# Patient Record
Sex: Female | Born: 1965 | Race: Black or African American | Hispanic: No | Marital: Married | Smoking: Never smoker
Health system: Southern US, Community
[De-identification: ages and names within clinical notes are randomized; demographics above are authoritative.]

## PROBLEM LIST (undated history)

## (undated) DIAGNOSIS — M75101 Unspecified rotator cuff tear or rupture of right shoulder, not specified as traumatic: Secondary | ICD-10-CM

## (undated) DIAGNOSIS — I10 Essential (primary) hypertension: Secondary | ICD-10-CM

## (undated) DIAGNOSIS — R51 Headache: Secondary | ICD-10-CM

## (undated) DIAGNOSIS — C50919 Malignant neoplasm of unspecified site of unspecified female breast: Secondary | ICD-10-CM

## (undated) DIAGNOSIS — Z923 Personal history of irradiation: Secondary | ICD-10-CM

## (undated) DIAGNOSIS — E669 Obesity, unspecified: Secondary | ICD-10-CM

## (undated) DIAGNOSIS — E119 Type 2 diabetes mellitus without complications: Secondary | ICD-10-CM

## (undated) DIAGNOSIS — Z8042 Family history of malignant neoplasm of prostate: Secondary | ICD-10-CM

## (undated) DIAGNOSIS — D219 Benign neoplasm of connective and other soft tissue, unspecified: Secondary | ICD-10-CM

## (undated) DIAGNOSIS — Z8041 Family history of malignant neoplasm of ovary: Secondary | ICD-10-CM

## (undated) DIAGNOSIS — N921 Excessive and frequent menstruation with irregular cycle: Secondary | ICD-10-CM

## (undated) DIAGNOSIS — E785 Hyperlipidemia, unspecified: Secondary | ICD-10-CM

## (undated) DIAGNOSIS — R519 Headache, unspecified: Secondary | ICD-10-CM

## (undated) DIAGNOSIS — R232 Flushing: Secondary | ICD-10-CM

## (undated) DIAGNOSIS — E559 Vitamin D deficiency, unspecified: Secondary | ICD-10-CM

## (undated) DIAGNOSIS — G4700 Insomnia, unspecified: Secondary | ICD-10-CM

## (undated) DIAGNOSIS — I890 Lymphedema, not elsewhere classified: Secondary | ICD-10-CM

## (undated) DIAGNOSIS — K219 Gastro-esophageal reflux disease without esophagitis: Secondary | ICD-10-CM

## (undated) DIAGNOSIS — R7301 Impaired fasting glucose: Secondary | ICD-10-CM

## (undated) DIAGNOSIS — R011 Cardiac murmur, unspecified: Secondary | ICD-10-CM

## (undated) DIAGNOSIS — K259 Gastric ulcer, unspecified as acute or chronic, without hemorrhage or perforation: Secondary | ICD-10-CM

## (undated) DIAGNOSIS — Z803 Family history of malignant neoplasm of breast: Secondary | ICD-10-CM

## (undated) DIAGNOSIS — D649 Anemia, unspecified: Secondary | ICD-10-CM

## (undated) HISTORY — DX: Hyperlipidemia, unspecified: E78.5

## (undated) HISTORY — DX: Family history of malignant neoplasm of ovary: Z80.41

## (undated) HISTORY — DX: Cardiac murmur, unspecified: R01.1

## (undated) HISTORY — DX: Impaired fasting glucose: R73.01

## (undated) HISTORY — DX: Type 2 diabetes mellitus without complications: E11.9

## (undated) HISTORY — DX: Malignant neoplasm of unspecified site of unspecified female breast: C50.919

## (undated) HISTORY — DX: Unspecified rotator cuff tear or rupture of right shoulder, not specified as traumatic: M75.101

## (undated) HISTORY — DX: Family history of malignant neoplasm of breast: Z80.3

## (undated) HISTORY — DX: Gastric ulcer, unspecified as acute or chronic, without hemorrhage or perforation: K25.9

## (undated) HISTORY — DX: Vitamin D deficiency, unspecified: E55.9

## (undated) HISTORY — DX: Excessive and frequent menstruation with irregular cycle: N92.1

## (undated) HISTORY — DX: Family history of malignant neoplasm of prostate: Z80.42

## (undated) HISTORY — DX: Lymphedema, not elsewhere classified: I89.0

## (undated) HISTORY — DX: Insomnia, unspecified: G47.00

## (undated) HISTORY — DX: Gastro-esophageal reflux disease without esophagitis: K21.9

## (undated) HISTORY — DX: Essential (primary) hypertension: I10

## (undated) HISTORY — PX: BREAST SURGERY: SHX581

## (undated) HISTORY — DX: Flushing: R23.2

## (undated) HISTORY — DX: Obesity, unspecified: E66.9

## (undated) SURGERY — LAPAROTOMY, EXPLORATORY
Anesthesia: General

---

## 1998-07-20 ENCOUNTER — Other Ambulatory Visit: Admission: RE | Admit: 1998-07-20 | Discharge: 1998-07-20 | Payer: Self-pay | Admitting: *Deleted

## 1999-07-27 ENCOUNTER — Other Ambulatory Visit: Admission: RE | Admit: 1999-07-27 | Discharge: 1999-07-27 | Payer: Self-pay | Admitting: *Deleted

## 2000-07-31 ENCOUNTER — Other Ambulatory Visit: Admission: RE | Admit: 2000-07-31 | Discharge: 2000-07-31 | Payer: Self-pay | Admitting: *Deleted

## 2000-11-23 ENCOUNTER — Other Ambulatory Visit: Admission: RE | Admit: 2000-11-23 | Discharge: 2000-11-23 | Payer: Self-pay | Admitting: *Deleted

## 2001-03-27 ENCOUNTER — Encounter: Admission: RE | Admit: 2001-03-27 | Discharge: 2001-05-30 | Payer: Self-pay | Admitting: *Deleted

## 2001-05-02 ENCOUNTER — Inpatient Hospital Stay (HOSPITAL_COMMUNITY): Admission: AD | Admit: 2001-05-02 | Discharge: 2001-05-02 | Payer: Self-pay | Admitting: *Deleted

## 2001-05-02 ENCOUNTER — Encounter: Payer: Self-pay | Admitting: Obstetrics and Gynecology

## 2001-05-09 ENCOUNTER — Ambulatory Visit (HOSPITAL_COMMUNITY): Admission: RE | Admit: 2001-05-09 | Discharge: 2001-05-09 | Payer: Self-pay | Admitting: *Deleted

## 2001-05-10 ENCOUNTER — Encounter (INDEPENDENT_AMBULATORY_CARE_PROVIDER_SITE_OTHER): Payer: Self-pay | Admitting: *Deleted

## 2001-05-10 ENCOUNTER — Inpatient Hospital Stay (HOSPITAL_COMMUNITY): Admission: AD | Admit: 2001-05-10 | Discharge: 2001-05-13 | Payer: Self-pay | Admitting: *Deleted

## 2001-06-27 ENCOUNTER — Other Ambulatory Visit: Admission: RE | Admit: 2001-06-27 | Discharge: 2001-06-27 | Payer: Self-pay | Admitting: *Deleted

## 2002-08-07 ENCOUNTER — Other Ambulatory Visit: Admission: RE | Admit: 2002-08-07 | Discharge: 2002-08-07 | Payer: Self-pay | Admitting: *Deleted

## 2003-09-10 ENCOUNTER — Other Ambulatory Visit: Admission: RE | Admit: 2003-09-10 | Discharge: 2003-09-10 | Payer: Self-pay | Admitting: *Deleted

## 2004-10-22 ENCOUNTER — Other Ambulatory Visit: Admission: RE | Admit: 2004-10-22 | Discharge: 2004-10-22 | Payer: Self-pay | Admitting: Family Medicine

## 2005-02-15 ENCOUNTER — Encounter: Admission: RE | Admit: 2005-02-15 | Discharge: 2005-05-16 | Payer: Self-pay | Admitting: Family Medicine

## 2005-11-17 ENCOUNTER — Other Ambulatory Visit: Admission: RE | Admit: 2005-11-17 | Discharge: 2005-11-17 | Payer: Self-pay | Admitting: Family Medicine

## 2006-01-26 ENCOUNTER — Ambulatory Visit (HOSPITAL_COMMUNITY): Admission: RE | Admit: 2006-01-26 | Discharge: 2006-01-26 | Payer: Self-pay | Admitting: Family Medicine

## 2008-02-20 ENCOUNTER — Other Ambulatory Visit: Admission: RE | Admit: 2008-02-20 | Discharge: 2008-02-20 | Payer: Self-pay | Admitting: Family Medicine

## 2008-08-04 ENCOUNTER — Encounter: Admission: RE | Admit: 2008-08-04 | Discharge: 2008-08-21 | Payer: Self-pay | Admitting: Family Medicine

## 2009-06-03 ENCOUNTER — Other Ambulatory Visit: Admission: RE | Admit: 2009-06-03 | Discharge: 2009-06-03 | Payer: Self-pay | Admitting: Obstetrics and Gynecology

## 2010-06-24 ENCOUNTER — Other Ambulatory Visit: Admission: RE | Admit: 2010-06-24 | Discharge: 2010-06-24 | Payer: Self-pay | Admitting: Obstetrics and Gynecology

## 2010-07-07 ENCOUNTER — Encounter: Admission: RE | Admit: 2010-07-07 | Discharge: 2010-07-07 | Payer: Self-pay | Admitting: Obstetrics and Gynecology

## 2010-07-14 ENCOUNTER — Encounter: Admission: RE | Admit: 2010-07-14 | Discharge: 2010-07-14 | Payer: Self-pay | Admitting: Obstetrics and Gynecology

## 2010-08-13 ENCOUNTER — Ambulatory Visit (HOSPITAL_COMMUNITY): Admission: RE | Admit: 2010-08-13 | Discharge: 2010-08-13 | Payer: Self-pay | Admitting: Interventional Radiology

## 2010-08-16 ENCOUNTER — Ambulatory Visit (HOSPITAL_COMMUNITY): Admission: RE | Admit: 2010-08-16 | Discharge: 2010-08-17 | Payer: Self-pay | Admitting: Interventional Radiology

## 2010-08-16 HISTORY — PX: UTERINE ARTERY EMBOLIZATION: SHX2629

## 2010-09-01 ENCOUNTER — Encounter: Admission: RE | Admit: 2010-09-01 | Discharge: 2010-09-01 | Payer: Self-pay | Admitting: Interventional Radiology

## 2011-01-13 LAB — CBC
HCT: 32.3 % — ABNORMAL LOW (ref 36.0–46.0)
MCHC: 33.9 g/dL (ref 30.0–36.0)
RBC: 3.6 MIL/uL — ABNORMAL LOW (ref 3.87–5.11)
RDW: 17.1 % — ABNORMAL HIGH (ref 11.5–15.5)
WBC: 6 10*3/uL (ref 4.0–10.5)

## 2011-01-13 LAB — CREATININE, SERUM
Creatinine, Ser: 0.73 mg/dL (ref 0.4–1.2)
GFR calc non Af Amer: >60 mL/min (ref >60)

## 2011-01-13 LAB — HCG, SERUM, QUALITATIVE: Preg, Serum: NEGATIVE

## 2011-01-27 ENCOUNTER — Other Ambulatory Visit: Payer: Self-pay | Admitting: Interventional Radiology

## 2011-01-27 DIAGNOSIS — D259 Leiomyoma of uterus, unspecified: Secondary | ICD-10-CM

## 2011-02-01 ENCOUNTER — Other Ambulatory Visit: Payer: Self-pay | Admitting: Obstetrics and Gynecology

## 2011-02-01 DIAGNOSIS — D219 Benign neoplasm of connective and other soft tissue, unspecified: Secondary | ICD-10-CM

## 2011-02-01 DIAGNOSIS — N920 Excessive and frequent menstruation with regular cycle: Secondary | ICD-10-CM

## 2011-03-01 ENCOUNTER — Encounter: Payer: Self-pay | Admitting: Radiology

## 2011-03-14 ENCOUNTER — Ambulatory Visit
Admission: RE | Admit: 2011-03-14 | Discharge: 2011-03-14 | Disposition: A | Discharge status: Home / Self Care | Payer: 59 | Source: Ambulatory Visit | Attending: Obstetrics and Gynecology | Admitting: Obstetrics and Gynecology

## 2011-03-14 DIAGNOSIS — D219 Benign neoplasm of connective and other soft tissue, unspecified: Secondary | ICD-10-CM

## 2011-03-14 DIAGNOSIS — N920 Excessive and frequent menstruation with regular cycle: Secondary | ICD-10-CM

## 2011-03-14 MED ORDER — GADOBENATE DIMEGLUMINE 529 MG/ML IV SOLN
20.0000 mL | Freq: Once | INTRAVENOUS | Status: AC | PRN
  Administered 2011-03-14: 20 mL via INTRAVENOUS

## 2011-03-18 NOTE — Op Note (Signed)
[facility]  Patient:    Kara Crawford, Kara Crawford                         MRN: [id number] Proc. Date: 05/10/01 Attending:  Willey Blade, M.D.                           Operative Report  PREOPERATIVE DIAGNOSES:       1. Intrauterine pregnancy at 36-2/7 weeks.                               2. Chronic hypertension.                               3. Gestational diabetes--diet controlled.                               4. Repeat cesarean section.                               5. Mature fetal pulmonary lung status.                               6. Breech presentation.                               7. Voluntary sterilization.  POSTOPERATIVE DIAGNOSES:      1. Intrauterine pregnancy at 36-2/7 weeks.                               2. Chronic hypertension.                               3. Gestational diabetes--diet controlled.                               4. Repeat cesarean section.                               5. Mature fetal pulmonary lung status.                               6. Breech presentation.                               7. Voluntary sterilization.  PROCEDURE:                    1. Repeat low transverse cesarean section.                               2. Bilateral tubal ligation.  3. Myomectomy--minor.  SURGEON:                      Willey Blade, M.D.  ANESTHESIA:                   Spinal.  ESTIMATED BLOOD LOSS:         1200 cc.  COMPLICATIONS:                None.  FINDINGS:                     At 0932 through a low transverse uterine incision, a viable female infant was delivered from a compound presentation. This was really a shoulder and right arm presentation, the baby was delivered without difficulty. Apgars were 9 and 9. The baby was a female weighing 8 pounds 14 ounces.  At time of surgery, pelvic adhesions were identified. Most notably, this was between the greater omentum and the anterior abdominal wall. A bilateral  tubal ligation was performed. The ovaries were visualized and noted to be normal.  DESCRIPTION OF PROCEDURE:     The patient was taken to the operating room where a spinal anesthetic was administered. The patient was placed on the operating table in the left lateral tilt position. The abdomen was prepped and draped in the usual sterile fashion with Betadine and sterile drapes. A Foley catheter was then inserted. The abdomen was entered through a Pfannenstiel incision and carried down sharply in the usual fashion. The peritoneum was atraumatically entered. The vesicouterine peritoneum overlying the lower uterine segment was incised and a bladder blade was placed behind the bladder to insure its protection during the procedure. Dissection was undertaken sharply to remove pelvic adhesions. The Bovie cautery used to achieve hemostasis during this dissection.  Next, the uterus was entered through a low transverse incision and carried out laterally using the operators fingers. The vertex was elevated into the incision; however, this was done with some difficulty due to a sort of a shoulder presentation. Upon entry into the intrauterine cavity, clear fluid was noted and the babys right hand and arm presented. This was reduced and with some difficulty, the vertex was then elevated into the incision and delivered at 0932. Apgars were 9 and 9. The oro/nasopharynx was bulb suctioned and the cord doubly clamped in the usual fashion. The baby was a female weighing 8 pounds 14 ounces, delivered at 0932 and did well.  The placenta was then manually extracted intact with three-vessel cord without difficulty. The interior of the uterus was wiped clean thoroughly with a wet sponge. The uterine incision was then closed in a two-layer fashion. The first layer, a running interlocking suture of #1 Vicryl suture. A second imbricating suture was placed across the primary suture line with a running stitch of  #1 Vicryl as well. Multiple figure-of-eights were necessary to achieve hemostasis.  Next, tubal ligation was then performed. First, the left tube was identified and traced to its fimbriated end to insure its positive identification. The tube was then grasped approximately 2 cm from the uterine fundus and through an avascular region of the mesosalpinx, two strands of 0 plain suture were passed and an approximate 3 cm segment of tube was tied off using these two strands of 0 plain. An approximate 2.5 cm segment of tube was then excised between the two existing ligatures. A single ligature of 0 silk was placed on the medial tubal stump near  the preexisting 0 silk suture. The same procedure was repeated upon the right tube. Three small fibroids, about 1.5 cm in size were then moved from the anterior serosal portion of the uterus. This was done with a Bovie cautery and the base of these were sutured with figure-of-eight sutures of #1 Vicryl.  The pelvis was then thoroughly irrigated and all operative areas were noted to be hemostatic. Attention was then turned to closure. The rectus muscle and anterior peritoneum were closed with a running stitch of 0 Monocryl. The subfascial areas were hemostatic. The fascia was then closed with a running stitch of 0 Panacryl in a running fashion. The subcutaneous tissue was irrigated and made hemostatic using a Bovie cautery. The skin was reapproximated with staples and a sterile dressing applied.  Final sponge, needle, and instrument counts were correct x 3. There were  no perioperative complications. The patient did receive an intraoperative antibiotic. DD:  05/10/01 TD:  05/10/01 Job: [id number]

## 2011-03-18 NOTE — Discharge Summary (Signed)
[facility] of [facility]  Patient:    Kara Crawford, Kara Crawford                       MRN: [id number] Adm. Date:  [id number] Disc. Date: [id number] Attending:  Donne Hazel Dictator:   Danie Chandler, R.N.                           Discharge Summary  ADMITTING DIAGNOSES:          1. Intrauterine pregnancy at 36-2/[redacted] weeks                                  gestation.                               2. Chronic hypertension.                               3. Gestational diabetes, diet controlled.                               4. Repeat cesarean section.                               5. Mature fetal pulmonary lung status.                               6. Breech presentation.                               7. Voluntary sterilization.  DISCHARGE DIAGNOSES:          1. Intrauterine pregnancy at 36-2/[redacted] weeks                                  gestation.                               2. Chronic hypertension.                               3. Gestational diabetes, diet controlled.                               4. Repeat cesarean section.                               5. Mature fetal pulmonary lung status.                               6. Breech presentation.                               7. Voluntary sterilization.  8. Anemia.  PROCEDURE ON May 10, 2001:   1. Repeat low transverse cesarean section.                               2. Bilateral tubal ligation.                               3. Minor myomectomy.  REASON FOR ADMISSION:         Please see dictated H&P.  HOSPITAL COURSE:              The patient was taken to the operating room and underwent the above named procedure without complication. This was productive of a viable female infant with Apgars of 9 at one minute and 9 at five minutes. Postoperatively on day #1, the patient had a good return of bowel function, was tolerating a regular diet. She also had good pain control. Her blood pressure was  stable on this day. Hemoglobin was 8.3, hematocrit 24.0, and white blood cell count 7.8. She was started on iron daily and had a repeat CBC ordered for the following day. On postoperative day #2, the patient was ambulating well without difficulty. Her vital signs were stable and her hemoglobin on this day was 7.2. On postoperative day #3, she requested discharge home.  CONDITION ON DISCHARGE:       Good.  DIET:                         Regular as tolerated.  ACTIVITY:                     No heavy lifting, no driving, no vaginal entry.  DISCHARGE FOLLOWUP:           She is to follow up in the office in tow weeks and she is to call for temperature greater than 100 degrees, persistent nausea or vomiting, heavy vaginal bleeding, and/or redness or drainage from the incision site.  DISCHARGE MEDICATIONS:        1. Prenatal vitamin one p.o. q.d.                               2. Percocet 5 mg one to two p.o. q.4-6h. p.r.n.                                  pain.                               3. Chromagen daily.                               4. Norvasc as directed by M.D.                               5. Ibuprofen as directed by M.D.  DD:  05/25/01 TD:  05/25/01 Job: [id number]

## 2011-03-18 NOTE — H&P (Signed)
[facility]  Patient:    Kara Crawford, Kara Crawford                       MRN: [id number] Adm. Date:  [id number] Attending:  Donne Hazel                         History and Physical  HISTORY OF PRESENT ILLNESS:   Ms. Rullo is a 45 year old female, gravida 4, para 3, at 36-2/[redacted] weeks gestation.  The patient underwent amniocentesis yesterday to confirm fetal lung maturity, this was mature, and she is now admitted to undergo repeat cesarean section and bilateral tubal ligation.  The patients pregnancy has been complicated by chronic hypertension (quite significant) and gestational diabetes, diet-controlled.  Yesterday she underwent amniocentesis, which showed an l/s of 6:1 with positive PG.  She has had two previous cesarean deliveries and requested a repeat.  She also requested tubal sterilization.  PAST MEDICAL HISTORY:         1. History of uterine fibroids.                               2. History of borderline chronic hypertension.  PAST SURGICAL HISTORY:        1. Cesarean section x 2.                               2. D&E, 1996.  OBSTETRICAL HISTORY:          1. Cesarean section at term, a female weighing 7 pounds 11 ounces, secondary to CPD.                               2. SAB x 1 with D&E.                               3. 1998, a repeat cesarean section at term, a female weighing 8 pounds 7 ounces.  MEDICATIONS:                  Norvasc, Aldomet, and prenatal vitamins.  ALLERGIES:                    None known.  PHYSICAL EXAMINATION:  VITAL SIGNS:                  Stable.  Temperature 98, pulse 100, respirations 20, blood pressure 160/94, fetal heart tones positive.  GENERAL:                      She is a well-developed, well-nourished, overweight female in no acute distress.  HEENT:                        Within normal limits.  NECK:                         Supple without adenopathy or thyromegaly.  HEART:                         Regular rate and rhythm without murmur, gallop, or rub.  LUNGS:  Clear to auscultation.  BREASTS:                      Breast exam done recently in the office was normal but is deferred upon admission.  ABDOMEN:                      Gravid and nontender.  EXTREMITIES:                  Grossly normal.  NEUROLOGIC:                   Grossly normal.  PELVIC:                       Deferred at this time.  This has been normal recently in the office.  ADMITTING DIAGNOSES:          1. Intrauterine pregnancy at 36-2/7 weeks.                               2. Repeat cesarean section.                               3. Chronic hypertension.                               4. Gestational diabetes, diet-controlled.                               5. Mature fetal pulmonary lung status.                               6. Requests permanent, voluntary sterilization.  PLAN:                         1. Repeat low transverse cesarean section.                               2. Bilateral tubal ligation.  DISCUSSION:                   The risks and benefits of the surgery were explained to the patient.  The risks of bleeding, infection, the risk of injury to surrounding organs was reviewed.  Also, the patient requested bilateral tubal ligation.  A failure rate of 1 in 200 was explained.  The patient was allowed to ask questions and wished to proceed. DD:  05/10/01 TD:  05/10/01 Job: [id number]

## 2011-03-30 ENCOUNTER — Ambulatory Visit
Admission: RE | Admit: 2011-03-30 | Discharge: 2011-03-30 | Disposition: A | Discharge status: Home / Self Care | Payer: 59 | Source: Ambulatory Visit | Attending: Interventional Radiology | Admitting: Interventional Radiology

## 2011-03-30 VITALS — BP 191/100 | HR 97 | Temp 98.7°F | Resp 16

## 2011-03-30 DIAGNOSIS — D259 Leiomyoma of uterus, unspecified: Secondary | ICD-10-CM

## 2011-03-30 HISTORY — DX: Benign neoplasm of connective and other soft tissue, unspecified: D21.9

## 2011-06-29 ENCOUNTER — Other Ambulatory Visit (HOSPITAL_COMMUNITY)
Admission: RE | Admit: 2011-06-29 | Discharge: 2011-06-29 | Disposition: A | Discharge status: Home / Self Care | Payer: 59 | Source: Ambulatory Visit | Attending: Obstetrics and Gynecology | Admitting: Obstetrics and Gynecology

## 2011-06-29 ENCOUNTER — Other Ambulatory Visit: Payer: Self-pay | Admitting: Obstetrics and Gynecology

## 2011-06-29 DIAGNOSIS — Z01419 Encounter for gynecological examination (general) (routine) without abnormal findings: Secondary | ICD-10-CM | POA: Insufficient documentation

## 2012-09-05 ENCOUNTER — Other Ambulatory Visit (HOSPITAL_COMMUNITY)
Admission: RE | Admit: 2012-09-05 | Discharge: 2012-09-05 | Disposition: A | Discharge status: Home / Self Care | Payer: 59 | Source: Ambulatory Visit | Attending: Obstetrics and Gynecology | Admitting: Obstetrics and Gynecology

## 2012-09-05 ENCOUNTER — Other Ambulatory Visit: Payer: Self-pay | Admitting: Obstetrics and Gynecology

## 2012-09-05 DIAGNOSIS — Z01419 Encounter for gynecological examination (general) (routine) without abnormal findings: Secondary | ICD-10-CM | POA: Insufficient documentation

## 2013-09-24 ENCOUNTER — Other Ambulatory Visit (HOSPITAL_COMMUNITY)
Admission: RE | Admit: 2013-09-24 | Discharge: 2013-09-24 | Disposition: A | Discharge status: Home / Self Care | Payer: 59 | Source: Ambulatory Visit | Attending: Obstetrics and Gynecology | Admitting: Obstetrics and Gynecology

## 2013-09-24 ENCOUNTER — Other Ambulatory Visit: Payer: Self-pay | Admitting: Obstetrics and Gynecology

## 2013-09-24 DIAGNOSIS — Z124 Encounter for screening for malignant neoplasm of cervix: Secondary | ICD-10-CM | POA: Insufficient documentation

## 2013-09-24 DIAGNOSIS — Z1151 Encounter for screening for human papillomavirus (HPV): Secondary | ICD-10-CM | POA: Insufficient documentation

## 2014-03-20 ENCOUNTER — Encounter (INDEPENDENT_AMBULATORY_CARE_PROVIDER_SITE_OTHER): Payer: Self-pay | Admitting: Surgery

## 2014-03-20 ENCOUNTER — Other Ambulatory Visit (INDEPENDENT_AMBULATORY_CARE_PROVIDER_SITE_OTHER): Payer: Self-pay

## 2014-03-20 ENCOUNTER — Ambulatory Visit (INDEPENDENT_AMBULATORY_CARE_PROVIDER_SITE_OTHER): Payer: 59 | Admitting: Surgery

## 2014-03-20 NOTE — Progress Notes (Signed)
Re:   Kara Crawford DOB:   23-Dec-1965 MRN:   [id number]  ASSESSMENT AND PLAN: 1.  Morbid obesity  Initial weight - 240, BMI - 45.3  Per the [facility], the patient is a candidate for bariatric surgery.  The patient attended our initial information session and reviewed the types of bariatric surgery.    The patient is interested in the Roux en Y Gastric Bypass.  I discussed with the patient the indications and risks of bariatric surgery.  The potential risks of surgery include, but are not limited to, bleeding, infection, leak from the bowel, DVT and PE, open surgery, long term nutrition consequences, and death.  The patient understands the importance of compliance and long term follow-up with our group after surgery.  From here we will obtain lab tests, x-rays, nutrition consult, and psych consult.  She is still thinking about other options.  She will bring her husband to the next appt.  2.  HTN 3.  Hypercholesterolemia 4.  Just starting to have irregular periods. 5.  Umbilical hernia.  7.  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 new bariatric   REFERRING PHYSICIAN: MCNEILL,Kara Crawford  HISTORY OF PRESENT ILLNESS: Kara Crawford is a 48 y.o. (DOB: Nov 10, 1965)  AA  female whose primary care physician is Kara Crawford and comes to me today for weight loss surgery.  The patient saw me for the bariatric information session. She has tried multiple diets including: Weight Watchers, Atkins diet, one from a nutritionist, one diet by Kara Crawford, and she start watching  calories. She's tried different medications 1 from [facility] [address] and once she got from [facility]. She's had no prior abdominal surgery or GI problems.  At this time she's interested in the gastric bypass operation.   Past Medical History  Diagnosis Date  . Fibroids   . Heart murmur   . Hypertension      Past Surgical History  Procedure  Laterality Date  . Uterine artery embolization  08-16-2010     Current Outpatient Prescriptions  Medication Sig Dispense Refill  . amLODipine (NORVASC) 10 MG tablet       . lisinopril-hydrochlorothiazide (PRINZIDE,ZESTORETIC) 20-12.5 MG per tablet       . metoprolol succinate (TOPROL-XL) 25 MG 24 hr tablet        No current facility-administered medications for this visit.    No Known Allergies  REVIEW OF SYSTEMS: Skin:  No history of rash.  No history of abnormal moles. Infection:  No history of hepatitis or HIV.  No history of MRSA. Neurologic:  No history of stroke.  No history of seizure.  No history of headaches. Cardiac:  No history of hypertension. No history of heart disease.   No history of seeing a cardiologist. Pulmonary:  Does not smoke cigarettes.  No asthma or bronchitis.  No OSA/CPAP.  Endocrine:  No diabetes. No thyroid disease.  Hypercholesterolemia. Gastrointestinal:  No history of stomach disease.  No history of liver disease.  No history of gall bladder disease.  No history of pancreas disease.  No history of colon disease. Urologic:  No history of kidney stones.  No history of bladder infections. GYN:  Kara Crawford, [facility] is her gyn Musculoskeletal:  No history of joint or back disease. Hematologic:  No bleeding disorder.  No history of anemia.  Not anticoagulated. Psycho-social:  The patient is oriented.   The patient  has no obvious psychologic or social impairment to understanding our conversation and plan.  SOCIAL and FAMILY HISTORY: Married.  I think that her husband works at [employer]. Going to school to get a sociology degree.  She wants to work with elderly. She has 3 children:  25 yo daughter, 66 yo daughter, 50 yo son  PHYSICAL EXAM: BP 126/78  Pulse 104  Temp(Src) 97.8 F (36.6 C)  Resp 16  Ht 5\' 1"  (1.549 m)  Wt 240 lb (108.863 kg)  BMI 45.37 kg/m2  General: Obese AA F who is alert and generally healthy appearing.   HEENT: Normal.  Pupils equal. Neck: Supple. No mass.  No thyroid mass. Lymph Nodes:  No supraclavicular or cervical nodes. Lungs: Clear to auscultation and symmetric breath sounds. Heart:  RRR. No murmur or rub.  Note she has a history of a murmur. Breasts:  Right - large, no mass  Left - large, no mass  Abdomen: Soft. No mass. No tenderness. Umbilical hernia.  Normal bowel sounds.  No abdominal scars.  She is more apple than pear and she has a lot of weight in her mid abdomen. Rectal: Not done. Extremities:  Good strength and ROM  in upper and lower extremities. Neurologic:  Grossly intact to motor and sensory function. Psychiatric: Has normal mood and affect. Behavior is normal.   DATA REVIEWED: Epic notes.  Kara Crawford,  [facility], [address]  [place], [place]    [place] Phone:  [phone] FAX:  [phone]

## 2014-04-04 ENCOUNTER — Other Ambulatory Visit (INDEPENDENT_AMBULATORY_CARE_PROVIDER_SITE_OTHER): Payer: Self-pay

## 2014-04-17 ENCOUNTER — Ambulatory Visit (HOSPITAL_COMMUNITY)
Admission: RE | Admit: 2014-04-17 | Discharge: 2014-04-17 | Disposition: A | Discharge status: Home / Self Care | Payer: 59 | Source: Ambulatory Visit | Attending: Surgery | Admitting: Surgery

## 2014-04-17 ENCOUNTER — Encounter (HOSPITAL_COMMUNITY)
Admission: RE | Disposition: A | Discharge status: Home / Self Care | Payer: Self-pay | Source: Ambulatory Visit | Attending: Surgery

## 2014-04-17 DIAGNOSIS — E669 Obesity, unspecified: Secondary | ICD-10-CM | POA: Insufficient documentation

## 2014-04-17 DIAGNOSIS — Z0189 Encounter for other specified special examinations: Secondary | ICD-10-CM | POA: Insufficient documentation

## 2014-04-17 HISTORY — PX: BREATH TEK H PYLORI: SHX5422

## 2014-04-17 SURGERY — BREATH TEST, FOR HELICOBACTER PYLORI

## 2014-04-18 ENCOUNTER — Encounter (HOSPITAL_COMMUNITY): Payer: Self-pay | Admitting: Surgery

## 2014-04-18 NOTE — Progress Notes (Signed)
04/17/14 0800  BREATH TEK ASSESSMENT  Referring MD Dr Lucia Gaskins  Time of Last PO Intake 2000  Baseline Breath At: 0801  Pranactin Given At: 0802  Post-Dose Breath At: 0817  Sample 1 2.6  Sample 2 2.9  Test Negative

## 2014-04-21 LAB — TSH: TSH: 1.296 u[IU]/mL (ref 0.350–4.500)

## 2014-05-06 ENCOUNTER — Encounter: Payer: Self-pay | Admitting: Dietician

## 2014-05-06 ENCOUNTER — Encounter: Payer: 59 | Attending: Surgery | Admitting: Dietician

## 2014-05-06 DIAGNOSIS — Z713 Dietary counseling and surveillance: Secondary | ICD-10-CM | POA: Diagnosis not present

## 2014-05-06 NOTE — Progress Notes (Signed)
  Pre-Op Assessment Visit:  Pre-Operative RYGB Surgery  Medical Nutrition Therapy:  Appt start time: 1000   End time:  6073.  Patient was seen on 05/06/2014 for Pre-Operative RYGB Nutrition Assessment. Assessment and letter of approval faxed to [facility] Bariatric Surgery Program coordinator on 05/06/2014.   Preferred Learning Style:   No preference indicated   Learning Readiness:   Ready  Handouts given during visit include:  Pre-Op Goals Bariatric Surgery Protein Shakes  Teaching Method Utilized:  Visual Auditory Hands on  Barriers to learning/adherence to lifestyle change: none  Demonstrated degree of understanding via:  Teach Back   Patient to call the [facility] to enroll in Pre-Op and Post-Op Nutrition Education when surgery date is scheduled.

## 2014-05-16 ENCOUNTER — Ambulatory Visit (HOSPITAL_COMMUNITY)
Admission: RE | Admit: 2014-05-16 | Discharge: 2014-05-16 | Disposition: A | Discharge status: Home / Self Care | Payer: 59 | Source: Ambulatory Visit | Attending: Surgery | Admitting: Surgery

## 2014-05-16 ENCOUNTER — Other Ambulatory Visit: Payer: Self-pay

## 2014-05-16 DIAGNOSIS — R011 Cardiac murmur, unspecified: Secondary | ICD-10-CM | POA: Insufficient documentation

## 2014-05-16 DIAGNOSIS — Q619 Cystic kidney disease, unspecified: Secondary | ICD-10-CM | POA: Insufficient documentation

## 2014-05-16 DIAGNOSIS — E119 Type 2 diabetes mellitus without complications: Secondary | ICD-10-CM | POA: Insufficient documentation

## 2014-05-16 DIAGNOSIS — N926 Irregular menstruation, unspecified: Secondary | ICD-10-CM | POA: Insufficient documentation

## 2014-05-16 DIAGNOSIS — K429 Umbilical hernia without obstruction or gangrene: Secondary | ICD-10-CM | POA: Insufficient documentation

## 2014-05-16 DIAGNOSIS — E7800 Pure hypercholesterolemia, unspecified: Secondary | ICD-10-CM | POA: Insufficient documentation

## 2014-05-16 DIAGNOSIS — I10 Essential (primary) hypertension: Secondary | ICD-10-CM | POA: Insufficient documentation

## 2014-05-16 DIAGNOSIS — Z6841 Body Mass Index (BMI) 40.0 and over, adult: Secondary | ICD-10-CM | POA: Insufficient documentation

## 2014-05-16 DIAGNOSIS — K7689 Other specified diseases of liver: Secondary | ICD-10-CM | POA: Insufficient documentation

## 2014-05-16 NOTE — Addendum Note (Signed)
Addended by: Ivor Costa on: 05/16/2014 09:17 AM   Modules accepted: Orders

## 2014-06-03 ENCOUNTER — Encounter: Payer: 59 | Attending: Family Medicine

## 2014-06-03 VITALS — Ht 60.0 in | Wt 240.8 lb

## 2014-06-03 DIAGNOSIS — Z713 Dietary counseling and surveillance: Secondary | ICD-10-CM | POA: Insufficient documentation

## 2014-06-03 DIAGNOSIS — E119 Type 2 diabetes mellitus without complications: Secondary | ICD-10-CM

## 2014-06-06 NOTE — Progress Notes (Signed)

## 2014-06-10 DIAGNOSIS — E119 Type 2 diabetes mellitus without complications: Secondary | ICD-10-CM

## 2014-06-10 NOTE — Progress Notes (Signed)

## 2014-06-17 DIAGNOSIS — E119 Type 2 diabetes mellitus without complications: Secondary | ICD-10-CM

## 2014-06-17 NOTE — Progress Notes (Signed)
Patient was seen on 06/17/14 for the third of a series of three diabetes self-management courses at the [facility]. The following learning objectives were met by the patient during this class:    State the amount of activity recommended for healthy living   Describe activities suitable for individual needs   Identify ways to regularly incorporate activity into daily life   Identify barriers to activity and ways to over come these barriers  Identify diabetes medications being personally used and their primary action for lowering glucose and possible side effects   Describe role of stress on blood glucose and develop strategies to address psychosocial issues   Identify diabetes complications and ways to prevent them  Explain how to manage diabetes during illness   Evaluate success in meeting personal goal   Establish 2-3 goals that they will plan to diligently work on until they return for the  54-month follow-up visit  Goals:  Follow Diabetes Meal Plan as instructed  Aim for 15-30 mins of physical activity daily as tolerated  Bring food record and glucose log to your follow up visit  Your patient has established the following 4 month goals in their individualized success plan: I will count my carb choices at most meals and snacks I will test my glucose at least 4 times a day, 7 days a week I will look at patterns in my record book at least 7 days a month  Your patient has identified these potential barriers to change:  None stated  Your patient has identified their diabetes self-care support plan as  [facility] Support Group available  Family support  Plan:  Attend Core 4 in 4 months

## 2014-06-26 ENCOUNTER — Ambulatory Visit: Payer: 59

## 2014-09-18 NOTE — Progress Notes (Signed)
Please put orders in Epic surgery 10-06-14 pre op 09-19-14 Thanks 

## 2014-09-19 ENCOUNTER — Encounter (HOSPITAL_COMMUNITY)
Admission: RE | Admit: 2014-09-19 | Discharge: 2014-09-19 | Disposition: A | Discharge status: Home / Self Care | Payer: 59 | Source: Ambulatory Visit | Attending: Surgery | Admitting: Surgery

## 2014-09-19 ENCOUNTER — Encounter (HOSPITAL_COMMUNITY): Payer: Self-pay

## 2014-09-19 ENCOUNTER — Ambulatory Visit (HOSPITAL_COMMUNITY)
Admission: RE | Admit: 2014-09-19 | Discharge: 2014-09-19 | Disposition: A | Discharge status: Home / Self Care | Payer: 59 | Source: Ambulatory Visit | Attending: Anesthesiology | Admitting: Anesthesiology

## 2014-09-19 DIAGNOSIS — I10 Essential (primary) hypertension: Secondary | ICD-10-CM

## 2014-09-19 DIAGNOSIS — Z01818 Encounter for other preprocedural examination: Secondary | ICD-10-CM | POA: Diagnosis not present

## 2014-09-19 HISTORY — DX: Anemia, unspecified: D64.9

## 2014-09-19 LAB — CBC
HCT: 40.3 % (ref 36.0–46.0)
Hemoglobin: 13.8 g/dL (ref 12.0–15.0)
MCH: 31.9 pg (ref 26.0–34.0)
MCHC: 34.2 g/dL (ref 30.0–36.0)
MCV: 93.1 fL (ref 78.0–100.0)
PLATELETS: 380 10*3/uL (ref 150–400)
RBC: 4.33 MIL/uL (ref 3.87–5.11)
RDW: 12.6 % (ref 11.5–15.5)
WBC: 6.9 10*3/uL (ref 4.0–10.5)

## 2014-09-19 LAB — BASIC METABOLIC PANEL
ANION GAP: 19 — AB (ref 5–15)
BUN: 12 mg/dL (ref 6–23)
CALCIUM: 11.4 mg/dL — AB (ref 8.4–10.5)
CO2: 24 mEq/L (ref 19–32)
Chloride: 97 mEq/L (ref 96–112)
Creatinine, Ser: 0.74 mg/dL (ref 0.50–1.10)
GFR calc non Af Amer: >90 mL/min (ref >90)
Glucose, Bld: 152 mg/dL — ABNORMAL HIGH (ref 70–99)
Potassium: 4.1 mEq/L (ref 3.7–5.3)
SODIUM: 140 meq/L (ref 137–147)

## 2014-09-19 LAB — HCG, SERUM, QUALITATIVE: PREG SERUM: NEGATIVE

## 2014-09-19 NOTE — Patient Instructions (Signed)
[place]  09/19/2014   Your procedure is scheduled on:   10-06-2014 Monday  Enter through [facility] and follow signs to [facility]. Arrive at  0530      AM ..  Call this number if you have problems the morning of surgery: [phone]  Or Presurgical Testing [phone].   For Living Will and/or Health Care Power Attorney Forms: please provide copy for your medical record,may bring AM of surgery(Forms should be already notarized -we do not provide this service).(09-19-14 No information preferred today).  Remember: Follow any bowel prep instructions per MD office.    Do not eat food/ or drink: After Midnight.      Take these medicines the morning of surgery with A SIP OF WATER: Metoprolol. Amlodipine. Atorvastatin.  Do not take any Diabetic meds AM of.   Do not wear jewelry, make-up or nail polish.  Do not wear deodorant, lotions, powders, or perfumes.   Do not shave legs and under arms- 48 hours(2 days) prior to first CHG shower.(Shaving face and neck okay.)  Do not bring valuables to the hospital.(Hospital is not responsible for lost valuables).  Contacts, dentures or removable bridgework, body piercing, hair pins may not be worn into surgery.  Leave suitcase in the car. After surgery it may be brought to your room.  For patients admitted to the hospital, checkout time is 11:00 AM the day of discharge.(Restricted visitors-Any Persons displaying flu-like symptoms or illness).    Patients discharged the day of surgery will not be allowed to drive home. Must have responsible person with you x 24 hours once discharged.  Name and phone number of your driver: Dominica Severin spouse [phone] cell     Please read over the following fact sheets that you were given:  CHG(Chlorhexidine Gluconate 4% Surgical Soap) use.           [facility] - Preparing for Surgery Before surgery, you can play an important role.  Because skin is not sterile, your skin needs to be as  free of germs as possible.  You can reduce the number of germs on your skin by washing with CHG (chlorahexidine gluconate) soap before surgery.  CHG is an antiseptic cleaner which kills germs and bonds with the skin to continue killing germs even after washing. Please DO NOT use if you have an allergy to CHG or antibacterial soaps.  If your skin becomes reddened/irritated stop using the CHG and inform your nurse when you arrive at Short Stay. Do not shave (including legs and underarms) for at least 48 hours prior to the first CHG shower.  You may shave your face/neck. Please follow these instructions carefully:  1.  Shower with CHG Soap the night before surgery and the  morning of Surgery.  2.  If you choose to wash your hair, wash your hair first as usual with your  normal  shampoo.  3.  After you shampoo, rinse your hair and body thoroughly to remove the  shampoo.                           4.  Use CHG as you would any other liquid soap.  You can apply chg directly  to the skin and wash                       Gently with a scrungie or clean washcloth.  5.  Apply  the CHG Soap to your body ONLY FROM THE NECK DOWN.   Do not use on face/ open                           Wound or open sores. Avoid contact with eyes, ears mouth and genitals (private parts).                       Wash face,  Genitals (private parts) with your normal soap.             6.  Wash thoroughly, paying special attention to the area where your surgery  will be performed.  7.  Thoroughly rinse your body with warm water from the neck down.  8.  DO NOT shower/wash with your normal soap after using and rinsing off  the CHG Soap.                9.  Pat yourself dry with a clean towel.            10.  Wear clean pajamas.            11.  Place clean sheets on your bed the night of your first shower and do not  sleep with pets. Day of Surgery : Do not apply any lotions/deodorants the morning of surgery.  Please wear clean clothes to the  hospital/surgery center.  FAILURE TO FOLLOW THESE INSTRUCTIONS MAY RESULT IN THE CANCELLATION OF YOUR SURGERY PATIENT SIGNATURE_________________________________  NURSE SIGNATURE__________________________________  ________________________________________________________________________

## 2014-09-19 NOTE — Pre-Procedure Instructions (Signed)
09-19-14 Ekg 7'15 Epic. CXR done today.

## 2014-09-19 NOTE — Progress Notes (Signed)
09-19-14 1100 AM Pt here no MD  order entry in Epic.

## 2014-09-22 ENCOUNTER — Encounter: Payer: 59 | Attending: Surgery

## 2014-09-22 DIAGNOSIS — Z713 Dietary counseling and surveillance: Secondary | ICD-10-CM | POA: Diagnosis not present

## 2014-09-22 DIAGNOSIS — Z6841 Body Mass Index (BMI) 40.0 and over, adult: Secondary | ICD-10-CM | POA: Insufficient documentation

## 2014-09-23 NOTE — Progress Notes (Signed)
  Pre-Operative Nutrition Class:  Appt start time: 9611   End time:  1830.  Patient was seen on 09/22/2014 for Pre-Operative Bariatric Surgery Education at [facility].   Surgery date: 10/06/2014 Surgery type: RYGB Start weight at [facility]: 242 lbs on 05/06/14 Weight today: 237 lbs  TANITA  BODY COMP RESULTS  09/22/14   BMI (kg/m^2) 44.8   Fat Mass (lbs) 115.5   Fat Free Mass (lbs) 121.5   Total Body Water (lbs) 89   Samples given per MNT protocol. Patient educated on appropriate usage: PB2 (qty 1) Lot #: [id number] Exp: 06/2015  Unjury protein powder (strawberry - qty 1) Lot #: [id number] Exp: 12/2015  Celebrate Vitamins Multivitamin (pineapple strawberry - qty 1) Lot #: [id number] Exp: 01/2015  Bariatric Advantage MVI (orange - qty 1) Lot #: [id number] Exp: 02/2016  Bariatric Advantage vitamin B12 (peppermint - qty 1) Lot #: [id number] Exp: 09/2014   The following the learning objectives were met by the patient during this course:  Identify Pre-Op Dietary Goals and will begin 2 weeks pre-operatively  Identify appropriate sources of fluids and proteins   State protein recommendations and appropriate sources pre and post-operatively  Identify Post-Operative Dietary Goals and will follow for 2 weeks post-operatively  Identify appropriate multivitamin and calcium sources  Describe the need for physical activity post-operatively and will follow MD recommendations  State when to call healthcare provider regarding medication questions or post-operative complications  Handouts given during class include:  Pre-Op Bariatric Surgery Diet Handout  Protein Shake Handout  Post-Op Bariatric Surgery Nutrition Handout  BELT Program Information Flyer  Support Group Information Flyer  [facility] Bariatric Supplements Price List  Follow-Up Plan: Patient will follow-up at [facility] 2 weeks post operatively for diet advancement per MD.

## 2014-09-24 ENCOUNTER — Other Ambulatory Visit (HOSPITAL_COMMUNITY)
Admission: RE | Admit: 2014-09-24 | Discharge: 2014-09-24 | Disposition: A | Discharge status: Home / Self Care | Payer: 59 | Source: Ambulatory Visit | Attending: Obstetrics and Gynecology | Admitting: Obstetrics and Gynecology

## 2014-09-24 ENCOUNTER — Other Ambulatory Visit: Payer: Self-pay | Admitting: Obstetrics and Gynecology

## 2014-09-24 ENCOUNTER — Ambulatory Visit (INDEPENDENT_AMBULATORY_CARE_PROVIDER_SITE_OTHER): Payer: Self-pay | Admitting: Surgery

## 2014-09-24 DIAGNOSIS — Z01411 Encounter for gynecological examination (general) (routine) with abnormal findings: Secondary | ICD-10-CM | POA: Diagnosis not present

## 2014-09-24 DIAGNOSIS — E119 Type 2 diabetes mellitus without complications: Secondary | ICD-10-CM | POA: Insufficient documentation

## 2014-09-24 NOTE — H&P (Signed)
Chief Complaint:  Morbid obesity and DM  History of Present Illness:  Kara Crawford is an 48 y.o. female who was initially seen by Dr. Lucia Gaskins and [place] [facility] acquired that she see me.  I met with her and her husband in the office preoperatively and reviewed Roux-en-Y gastric bypass in detail.  I answered questions mainly posed by her husband since she is very well versed and excited about her upcoming Roux-en-Y gastric bypass.  Since she was seen in our office he has been diagnosed with diabetes and currently takes metformin.  Her upper GI showed no evidence of a hiatal hernia.  Her ultrasound showed her gallbladder wall was slightly thickened.  No gallstones were seen.  She was given a prescription for pain meds postop and also for bowel prep to do preop.  We'll proceed with Roux-en-Y gastric bypass on December 7.  Past Medical History  Diagnosis Date  . Fibroids   . Heart murmur   . Hypertension   . Hyperlipidemia   . Diabetes mellitus without complication   . Anemia     anemia prior to uterine emboliztion procedure.    Past Surgical History  Procedure Laterality Date  . Uterine artery embolization  08-16-2010  . Breath tek h pylori N/A 04/17/2014    Procedure: BREATH TEK H PYLORI;  Surgeon: Shann Medal, MD;  Location: [facility];  Service: General;  Laterality: N/A;  . Cesarean section      x3- normal development    Current Outpatient Prescriptions  Medication Sig Dispense Refill  . amLODipine (NORVASC) 10 MG tablet Take 10 mg by mouth every morning.     Marland Kitchen aspirin 81 MG tablet Take 81 mg by mouth at bedtime.     Marland Kitchen atorvastatin (LIPITOR) 80 MG tablet Take 160 mg by mouth every morning.    . Cyanocobalamin (VITAMIN B-12 PO) Take 1 tablet by mouth every morning.    . Ergocalciferol (VITAMIN D2 PO) Take 1 tablet by mouth every morning.    Marland Kitchen ibuprofen (ADVIL,MOTRIN) 200 MG tablet Take 400 mg by mouth every 6 (six) hours as needed for headache or moderate pain.    Marland Kitchen  lisinopril-hydrochlorothiazide (PRINZIDE,ZESTORETIC) 20-12.5 MG per tablet Take 1 tablet by mouth every morning.     . metoprolol succinate (TOPROL-XL) 25 MG 24 hr tablet Take 25 mg by mouth every morning.     . Multiple Vitamin (MULTIVITAMIN WITH MINERALS) TABS tablet Take 1 tablet by mouth every morning. Women's one-a-day    . sitaGLIPtin-metformin (JANUMET) 50-500 MG per tablet Take 1 tablet by mouth every morning.      No current facility-administered medications for this visit.   Review of patient's allergies indicates no known allergies. Family History  Problem Relation Age of Onset  . Diabetes Mother   . Hypertension Mother   . Stroke Mother   . Diabetes Father   . Hypertension Father    Social History:   reports that she has never smoked. She does not have any smokeless tobacco history on file. She reports that she drinks alcohol. She reports that she does not use illicit drugs.   REVIEW OF SYSTEMS : Negative except for see problem list  Physical Exam:   Last menstrual period 09/17/2014. There is no weight on file to calculate BMI.  Gen:  WDWN AAF NAD  Neurological: Alert and oriented to person, place, and time. Motor and sensory function is grossly intact  Head: Normocephalic and atraumatic.  Eyes: Conjunctivae are normal. Pupils are  equal, round, and reactive to light. No scleral icterus.  Neck: Normal range of motion. Neck supple. No tracheal deviation or thyromegaly present.  Cardiovascular:  SR without murmurs or gallops.  No carotid bruits Breast:  Not examined Respiratory: Effort normal.  No respiratory distress. No chest wall tenderness. Breath sounds normal.  No wheezes, rales or rhonchi.  Abdomen:  Umbilical hernia GU:  Not examined Musculoskeletal: Normal range of motion. Extremities are nontender. No cyanosis, edema or clubbing noted Lymphadenopathy: No cervical, preauricular, postauricular or axillary adenopathy is present Skin: Skin is warm and dry. No rash  noted. No diaphoresis. No erythema. No pallor. Pscyh: Normal mood and affect. Behavior is normal. Judgment and thought content normal.   LABORATORY RESULTS: No results found for this or any previous visit (from the past 48 hour(s)).   RADIOLOGY RESULTS: No results found.  Problem List: Patient Active Problem List   Diagnosis Date Noted  . Diabetes 09/24/2014  . Morbid obesity 03/20/2014    Assessment & Plan: DM and obesity for roux en y gastric bypass    Matt B. Hassell Done, MD, [facility] [phone] beeper [phone]  09/24/2014 4:20 PM

## 2014-09-29 LAB — CYTOLOGY - PAP

## 2014-10-05 NOTE — Anesthesia Preprocedure Evaluation (Signed)
Anesthesia Evaluation  Patient identified by MRN, date of birth, ID band Patient awake    Reviewed: Allergy & Precautions, H&P , NPO status , Patient's Chart, lab work & pertinent test results, reviewed documented beta blocker date and time   Airway Mallampati: III  TM Distance: >3 FB Neck ROM: Full    Dental no notable dental hx.    Pulmonary neg pulmonary ROS,  breath sounds clear to auscultation  Pulmonary exam normal       Cardiovascular hypertension, Pt. on medications and Pt. on home beta blockers negative cardio ROS  + Valvular Problems/Murmurs Rhythm:Regular Rate:Normal     Neuro/Psych negative neurological ROS  negative psych ROS   GI/Hepatic negative GI ROS, Neg liver ROS,   Endo/Other  diabetes, Type 2, Oral Hypoglycemic AgentsMorbid obesity  Renal/GU negative Renal ROS     Musculoskeletal negative musculoskeletal ROS (+)   Abdominal (+) + obese,   Peds  Hematology  (+) anemia ,   Anesthesia Other Findings   Reproductive/Obstetrics negative OB ROS                             Anesthesia Physical Anesthesia Plan  ASA: III  Anesthesia Plan: General   Post-op Pain Management:    Induction: Intravenous  Airway Management Planned: Oral ETT  Additional Equipment:   Intra-op Plan:   Post-operative Plan: Extubation in OR  Informed Consent: I have reviewed the patients History and Physical, chart, labs and discussed the procedure including the risks, benefits and alternatives for the proposed anesthesia with the patient or authorized representative who has indicated his/her understanding and acceptance.   Dental advisory given  Plan Discussed with: CRNA  Anesthesia Plan Comments:         Anesthesia Quick Evaluation

## 2014-10-06 ENCOUNTER — Inpatient Hospital Stay (HOSPITAL_COMMUNITY): Payer: 59 | Admitting: Anesthesiology

## 2014-10-06 ENCOUNTER — Encounter (HOSPITAL_COMMUNITY): Payer: Self-pay | Admitting: *Deleted

## 2014-10-06 ENCOUNTER — Inpatient Hospital Stay (HOSPITAL_COMMUNITY)
Admission: RE | Admit: 2014-10-06 | Discharge: 2014-10-08 | DRG: 621 | Disposition: A | Discharge status: Home / Self Care | Payer: 59 | Source: Ambulatory Visit | Attending: Surgery | Admitting: Surgery

## 2014-10-06 ENCOUNTER — Encounter (HOSPITAL_COMMUNITY)
Admission: RE | Disposition: A | Discharge status: Home / Self Care | Payer: Self-pay | Source: Ambulatory Visit | Attending: Surgery

## 2014-10-06 DIAGNOSIS — Z6841 Body Mass Index (BMI) 40.0 and over, adult: Secondary | ICD-10-CM | POA: Diagnosis not present

## 2014-10-06 DIAGNOSIS — E119 Type 2 diabetes mellitus without complications: Secondary | ICD-10-CM | POA: Diagnosis present

## 2014-10-06 DIAGNOSIS — E785 Hyperlipidemia, unspecified: Secondary | ICD-10-CM | POA: Diagnosis present

## 2014-10-06 DIAGNOSIS — K228 Other specified diseases of esophagus: Secondary | ICD-10-CM

## 2014-10-06 DIAGNOSIS — Z9884 Bariatric surgery status: Secondary | ICD-10-CM

## 2014-10-06 DIAGNOSIS — I10 Essential (primary) hypertension: Secondary | ICD-10-CM | POA: Diagnosis present

## 2014-10-06 DIAGNOSIS — Z79899 Other long term (current) drug therapy: Secondary | ICD-10-CM | POA: Diagnosis not present

## 2014-10-06 DIAGNOSIS — K229 Disease of esophagus, unspecified: Secondary | ICD-10-CM

## 2014-10-06 HISTORY — PX: GASTRIC ROUX-EN-Y: SHX5262

## 2014-10-06 LAB — CBC WITH DIFFERENTIAL/PLATELET
BASOS ABS: 0 10*3/uL (ref 0.0–0.1)
Basophils Relative: 0 % (ref 0–1)
EOS PCT: 1 % (ref 0–5)
Eosinophils Absolute: 0 10*3/uL (ref 0.0–0.7)
HEMATOCRIT: 36.7 % (ref 36.0–46.0)
HEMOGLOBIN: 12.1 g/dL (ref 12.0–15.0)
Lymphocytes Relative: 30 % (ref 12–46)
Lymphs Abs: 1.6 10*3/uL (ref 0.7–4.0)
MCH: 30.6 pg (ref 26.0–34.0)
MCHC: 33 g/dL (ref 30.0–36.0)
MCV: 92.7 fL (ref 78.0–100.0)
MONO ABS: 0.2 10*3/uL (ref 0.1–1.0)
MONOS PCT: 4 % (ref 3–12)
NEUTROS ABS: 3.6 10*3/uL (ref 1.7–7.7)
Neutrophils Relative %: 65 % (ref 43–77)
Platelets: 293 10*3/uL (ref 150–400)
RBC: 3.96 MIL/uL (ref 3.87–5.11)
RDW: 12.7 % (ref 11.5–15.5)
WBC: 5.5 10*3/uL (ref 4.0–10.5)

## 2014-10-06 LAB — COMPREHENSIVE METABOLIC PANEL
ALT: 38 U/L — ABNORMAL HIGH (ref 0–35)
AST: 57 U/L — ABNORMAL HIGH (ref 0–37)
Albumin: 4.6 g/dL (ref 3.5–5.2)
Alkaline Phosphatase: 56 U/L (ref 39–117)
Anion gap: 16 — ABNORMAL HIGH (ref 5–15)
BUN: 8 mg/dL (ref 6–23)
CALCIUM: 10.2 mg/dL (ref 8.4–10.5)
CHLORIDE: 94 meq/L — AB (ref 96–112)
CO2: 24 meq/L (ref 19–32)
CREATININE: 0.82 mg/dL (ref 0.50–1.10)
GFR calc Af Amer: >90 mL/min (ref >90)
GFR, EST NON AFRICAN AMERICAN: 83 mL/min — AB (ref >90)
Glucose, Bld: 154 mg/dL — ABNORMAL HIGH (ref 70–99)
Potassium: 3.3 mEq/L — ABNORMAL LOW (ref 3.7–5.3)
Sodium: 134 mEq/L — ABNORMAL LOW (ref 137–147)
Total Bilirubin: 0.7 mg/dL (ref 0.3–1.2)
Total Protein: 8.6 g/dL — ABNORMAL HIGH (ref 6.0–8.3)

## 2014-10-06 LAB — CREATININE, SERUM
Creatinine, Ser: 0.79 mg/dL (ref 0.50–1.10)
GFR calc non Af Amer: >90 mL/min (ref >90)

## 2014-10-06 LAB — CBC
HCT: 36 % (ref 36.0–46.0)
Hemoglobin: 11.9 g/dL — ABNORMAL LOW (ref 12.0–15.0)
MCH: 30.5 pg (ref 26.0–34.0)
MCHC: 33.1 g/dL (ref 30.0–36.0)
MCV: 92.3 fL (ref 78.0–100.0)
Platelets: 321 10*3/uL (ref 150–400)
RBC: 3.9 MIL/uL (ref 3.87–5.11)
RDW: 12.8 % (ref 11.5–15.5)
WBC: 11.5 10*3/uL — AB (ref 4.0–10.5)

## 2014-10-06 LAB — HEMOGLOBIN AND HEMATOCRIT, BLOOD

## 2014-10-06 LAB — PREGNANCY, URINE: PREG TEST UR: NEGATIVE

## 2014-10-06 LAB — GLUCOSE, CAPILLARY: GLUCOSE-CAPILLARY: 219 mg/dL — AB (ref 70–99)

## 2014-10-06 SURGERY — LAPAROSCOPIC ROUX-EN-Y GASTRIC BYPASS WITH UPPER ENDOSCOPY
Anesthesia: General | Site: Abdomen

## 2014-10-06 MED ORDER — LACTATED RINGERS IV SOLN: INTRAVENOUS | Status: DC

## 2014-10-06 MED ORDER — PROPOFOL 10 MG/ML IV BOLUS
INTRAVENOUS | Status: DC | PRN
  Administered 2014-10-06: 200 mg via INTRAVENOUS

## 2014-10-06 MED ORDER — ACETAMINOPHEN 160 MG/5ML PO SOLN
650.0000 mg | ORAL | Status: DC | PRN
  Administered 2014-10-08: 650 mg via ORAL
  Filled 2014-10-06: qty 20.3

## 2014-10-06 MED ORDER — GLYCOPYRROLATE 0.2 MG/ML IJ SOLN
INTRAMUSCULAR | Status: DC | PRN
  Administered 2014-10-06: .4 mg via INTRAVENOUS
  Administered 2014-10-06: 0.2 mg via INTRAVENOUS

## 2014-10-06 MED ORDER — HYDROMORPHONE HCL 1 MG/ML IJ SOLN
0.2500 mg | INTRAMUSCULAR | Status: DC | PRN
  Administered 2014-10-06 (×2): 0.5 mg via INTRAVENOUS

## 2014-10-06 MED ORDER — NEOSTIGMINE METHYLSULFATE 10 MG/10ML IV SOLN
INTRAVENOUS | Status: DC | PRN
  Administered 2014-10-06: 3 mg via INTRAVENOUS

## 2014-10-06 MED ORDER — OXYCODONE HCL 5 MG PO TABS: 5.0000 mg | ORAL_TABLET | Freq: Once | ORAL | Status: DC | PRN

## 2014-10-06 MED ORDER — OXYCODONE HCL 5 MG/5ML PO SOLN
5.0000 mg | Freq: Once | ORAL | Status: DC | PRN
  Filled 2014-10-06: qty 5

## 2014-10-06 MED ORDER — OXYCODONE HCL 5 MG/5ML PO SOLN
5.0000 mg | ORAL | Status: DC | PRN
  Administered 2014-10-07: 5 mg via ORAL
  Administered 2014-10-07: 10 mg via ORAL
  Administered 2014-10-07: 5 mg via ORAL
  Administered 2014-10-08: 10 mg via ORAL
  Filled 2014-10-06: qty 5
  Filled 2014-10-06 (×2): qty 10
  Filled 2014-10-06: qty 5

## 2014-10-06 MED ORDER — ACETAMINOPHEN 160 MG/5ML PO SOLN: 325.0000 mg | ORAL | Status: DC | PRN

## 2014-10-06 MED ORDER — EVICEL 5 ML EX KIT
PACK | CUTANEOUS | Status: DC | PRN
  Administered 2014-10-06: 5 mL

## 2014-10-06 MED ORDER — LIDOCAINE HCL (CARDIAC) 20 MG/ML IV SOLN
INTRAVENOUS | Status: AC
  Filled 2014-10-06: qty 5

## 2014-10-06 MED ORDER — BUPIVACAINE LIPOSOME 1.3 % IJ SUSP
20.0000 mL | Freq: Once | INTRAMUSCULAR | Status: DC
  Filled 2014-10-06: qty 20

## 2014-10-06 MED ORDER — GLYCOPYRROLATE 0.2 MG/ML IJ SOLN
INTRAMUSCULAR | Status: AC
  Filled 2014-10-06: qty 3

## 2014-10-06 MED ORDER — MIDAZOLAM HCL 5 MG/5ML IJ SOLN
INTRAMUSCULAR | Status: DC | PRN
  Administered 2014-10-06 (×2): 1 mg via INTRAVENOUS

## 2014-10-06 MED ORDER — KCL IN DEXTROSE-NACL 20-5-0.45 MEQ/L-%-% IV SOLN
INTRAVENOUS | Status: DC
  Administered 2014-10-06: 16:00:00 via INTRAVENOUS
  Administered 2014-10-07: 100 mL/h via INTRAVENOUS
  Administered 2014-10-07: 21:00:00 via INTRAVENOUS
  Filled 2014-10-06 (×6): qty 1000

## 2014-10-06 MED ORDER — HYDROMORPHONE HCL 1 MG/ML IJ SOLN
INTRAMUSCULAR | Status: AC
  Filled 2014-10-06: qty 1

## 2014-10-06 MED ORDER — PANTOPRAZOLE SODIUM 40 MG IV SOLR
40.0000 mg | Freq: Every day | INTRAVENOUS | Status: DC
  Administered 2014-10-06 – 2014-10-07 (×2): 40 mg via INTRAVENOUS
  Filled 2014-10-06 (×3): qty 40

## 2014-10-06 MED ORDER — SODIUM CHLORIDE 0.9 % IJ SOLN
INTRAMUSCULAR | Status: AC
  Filled 2014-10-06: qty 10

## 2014-10-06 MED ORDER — CHLORHEXIDINE GLUCONATE CLOTH 2 % EX PADS: 6.0000 | MEDICATED_PAD | Freq: Once | CUTANEOUS | Status: DC

## 2014-10-06 MED ORDER — PHENYLEPHRINE HCL 10 MG/ML IJ SOLN
INTRAMUSCULAR | Status: DC | PRN
  Administered 2014-10-06 (×2): 20 ug via INTRAVENOUS

## 2014-10-06 MED ORDER — CISATRACURIUM BESYLATE 20 MG/10ML IV SOLN
INTRAVENOUS | Status: AC
  Filled 2014-10-06: qty 10

## 2014-10-06 MED ORDER — TISSEEL VH 10 ML EX KIT
PACK | CUTANEOUS | Status: AC
  Filled 2014-10-06: qty 1

## 2014-10-06 MED ORDER — LIDOCAINE HCL (CARDIAC) 20 MG/ML IV SOLN
INTRAVENOUS | Status: DC | PRN
  Administered 2014-10-06: 75 mg via INTRAVENOUS

## 2014-10-06 MED ORDER — UNJURY CHICKEN SOUP POWDER
2.0000 [oz_av] | Freq: Four times a day (QID) | ORAL | Status: DC
  Administered 2014-10-08: 2 [oz_av] via ORAL

## 2014-10-06 MED ORDER — HEPARIN SODIUM (PORCINE) 5000 UNIT/ML IJ SOLN
5000.0000 [IU] | Freq: Three times a day (TID) | INTRAMUSCULAR | Status: DC
  Administered 2014-10-06 – 2014-10-08 (×6): 5000 [IU] via SUBCUTANEOUS
  Filled 2014-10-06 (×9): qty 1

## 2014-10-06 MED ORDER — MORPHINE SULFATE 2 MG/ML IJ SOLN
2.0000 mg | INTRAMUSCULAR | Status: DC | PRN
  Administered 2014-10-06 – 2014-10-07 (×5): 4 mg via INTRAVENOUS
  Filled 2014-10-06 (×5): qty 2

## 2014-10-06 MED ORDER — ONDANSETRON HCL 4 MG/2ML IJ SOLN
INTRAMUSCULAR | Status: AC
  Filled 2014-10-06: qty 2

## 2014-10-06 MED ORDER — FENTANYL CITRATE 0.05 MG/ML IJ SOLN
INTRAMUSCULAR | Status: AC
  Filled 2014-10-06: qty 5

## 2014-10-06 MED ORDER — BUPIVACAINE-EPINEPHRINE (PF) 0.25% -1:200000 IJ SOLN
INTRAMUSCULAR | Status: AC
  Filled 2014-10-06: qty 30

## 2014-10-06 MED ORDER — PROPOFOL 10 MG/ML IV BOLUS
INTRAVENOUS | Status: AC
  Filled 2014-10-06: qty 20

## 2014-10-06 MED ORDER — MEPERIDINE HCL 50 MG/ML IJ SOLN: 6.2500 mg | INTRAMUSCULAR | Status: DC | PRN

## 2014-10-06 MED ORDER — LACTATED RINGERS IR SOLN
Status: DC | PRN
  Administered 2014-10-06: 1000 mL

## 2014-10-06 MED ORDER — DEXTROSE 5 % IV SOLN
INTRAVENOUS | Status: AC
  Filled 2014-10-06: qty 2

## 2014-10-06 MED ORDER — DEXAMETHASONE SODIUM PHOSPHATE 10 MG/ML IJ SOLN
INTRAMUSCULAR | Status: DC | PRN
  Administered 2014-10-06: 4 mg via INTRAVENOUS

## 2014-10-06 MED ORDER — ATROPINE SULFATE 0.4 MG/ML IJ SOLN
INTRAMUSCULAR | Status: AC
  Filled 2014-10-06: qty 1

## 2014-10-06 MED ORDER — NEOSTIGMINE METHYLSULFATE 10 MG/10ML IV SOLN
INTRAVENOUS | Status: AC
  Filled 2014-10-06: qty 1

## 2014-10-06 MED ORDER — BUPIVACAINE LIPOSOME 1.3 % IJ SUSP
INTRAMUSCULAR | Status: DC | PRN
  Administered 2014-10-06: 20 mL

## 2014-10-06 MED ORDER — ONDANSETRON HCL 4 MG/2ML IJ SOLN: 4.0000 mg | INTRAMUSCULAR | Status: DC | PRN

## 2014-10-06 MED ORDER — METOPROLOL SUCCINATE ER 25 MG PO TB24
25.0000 mg | ORAL_TABLET | Freq: Every morning | ORAL | Status: DC
  Administered 2014-10-07 – 2014-10-08 (×2): 25 mg via ORAL
  Filled 2014-10-06 (×2): qty 1

## 2014-10-06 MED ORDER — MIDAZOLAM HCL 2 MG/2ML IJ SOLN
INTRAMUSCULAR | Status: AC
  Filled 2014-10-06: qty 2

## 2014-10-06 MED ORDER — SODIUM CHLORIDE 0.9 % IJ SOLN
INTRAMUSCULAR | Status: AC
  Filled 2014-10-06: qty 50

## 2014-10-06 MED ORDER — EPHEDRINE SULFATE 50 MG/ML IJ SOLN
INTRAMUSCULAR | Status: AC
  Filled 2014-10-06: qty 1

## 2014-10-06 MED ORDER — PROMETHAZINE HCL 25 MG/ML IJ SOLN
INTRAMUSCULAR | Status: AC
  Filled 2014-10-06: qty 1

## 2014-10-06 MED ORDER — UNJURY VANILLA POWDER: 2.0000 [oz_av] | Freq: Four times a day (QID) | ORAL | Status: DC

## 2014-10-06 MED ORDER — TISSEEL VH 10 ML EX KIT
PACK | CUTANEOUS | Status: AC
  Filled 2014-10-06: qty 2

## 2014-10-06 MED ORDER — LACTATED RINGERS IV SOLN
INTRAVENOUS | Status: DC | PRN
  Administered 2014-10-06 (×3): via INTRAVENOUS

## 2014-10-06 MED ORDER — PROMETHAZINE HCL 25 MG/ML IJ SOLN
6.2500 mg | INTRAMUSCULAR | Status: DC | PRN
  Administered 2014-10-06: 6.25 mg via INTRAVENOUS

## 2014-10-06 MED ORDER — HEPARIN SODIUM (PORCINE) 5000 UNIT/ML IJ SOLN
5000.0000 [IU] | INTRAMUSCULAR | Status: AC
  Administered 2014-10-06: 5000 [IU] via SUBCUTANEOUS
  Filled 2014-10-06: qty 1

## 2014-10-06 MED ORDER — AMLODIPINE BESYLATE 10 MG PO TABS
10.0000 mg | ORAL_TABLET | Freq: Every morning | ORAL | Status: DC
  Administered 2014-10-07 – 2014-10-08 (×2): 10 mg via ORAL
  Filled 2014-10-06 (×2): qty 1

## 2014-10-06 MED ORDER — UNJURY CHOCOLATE CLASSIC POWDER: 2.0000 [oz_av] | Freq: Four times a day (QID) | ORAL | Status: DC

## 2014-10-06 MED ORDER — SUCCINYLCHOLINE CHLORIDE 20 MG/ML IJ SOLN
INTRAMUSCULAR | Status: DC | PRN
  Administered 2014-10-06: 140 mg via INTRAVENOUS

## 2014-10-06 MED ORDER — ONDANSETRON HCL 4 MG/2ML IJ SOLN
INTRAMUSCULAR | Status: DC | PRN
  Administered 2014-10-06 (×2): 2 mg via INTRAVENOUS

## 2014-10-06 MED ORDER — FENTANYL CITRATE 0.05 MG/ML IJ SOLN
INTRAMUSCULAR | Status: DC | PRN
  Administered 2014-10-06: 100 ug via INTRAVENOUS
  Administered 2014-10-06 (×2): 50 ug via INTRAVENOUS
  Administered 2014-10-06: 150 ug via INTRAVENOUS
  Administered 2014-10-06 (×2): 50 ug via INTRAVENOUS
  Administered 2014-10-06: 100 ug via INTRAVENOUS

## 2014-10-06 MED ORDER — CISATRACURIUM BESYLATE (PF) 10 MG/5ML IV SOLN
INTRAVENOUS | Status: DC | PRN
  Administered 2014-10-06: 4 mg via INTRAVENOUS
  Administered 2014-10-06: 2 mg via INTRAVENOUS
  Administered 2014-10-06: 4 mg via INTRAVENOUS
  Administered 2014-10-06 (×2): 2 mg via INTRAVENOUS
  Administered 2014-10-06: 8 mg via INTRAVENOUS

## 2014-10-06 MED ORDER — 0.9 % SODIUM CHLORIDE (POUR BTL) OPTIME
TOPICAL | Status: DC | PRN
  Administered 2014-10-06: 1000 mL

## 2014-10-06 MED ORDER — SODIUM CHLORIDE 0.9 % IJ SOLN
INTRAMUSCULAR | Status: DC | PRN
  Administered 2014-10-06: 10 mL

## 2014-10-06 MED ORDER — DEXTROSE 5 % IV SOLN
2.0000 g | INTRAVENOUS | Status: AC
  Administered 2014-10-06 (×2): 2 g via INTRAVENOUS

## 2014-10-06 SURGICAL SUPPLY — 68 items
APPLICATOR COTTON TIP 6IN STRL (MISCELLANEOUS) ×3 IMPLANT
BENZOIN TINCTURE PRP APPL 2/3 (GAUZE/BANDAGES/DRESSINGS) IMPLANT
BLADE SURG 15 STRL LF DISP TIS (BLADE) ×1 IMPLANT
BLADE SURG 15 STRL SS (BLADE) ×2
CABLE HIGH FREQUENCY MONO STRZ (ELECTRODE) IMPLANT
CANISTER SUCT 3000ML (MISCELLANEOUS) ×3 IMPLANT
CLIP SUT LAPRA TY ABSORB (SUTURE) ×9 IMPLANT
CLOSURE WOUND 1/2 X4 (GAUZE/BANDAGES/DRESSINGS)
DEVICE SUT QUICK LOAD TK 5 (STAPLE) IMPLANT
DEVICE SUT TI-KNOT TK 5X26 (MISCELLANEOUS) IMPLANT
DEVICE SUTURE ENDOST 10MM (ENDOMECHANICALS) ×3 IMPLANT
DEVICE TI KNOT TK5 (MISCELLANEOUS)
DISSECTOR BLUNT TIP ENDO 5MM (MISCELLANEOUS) IMPLANT
DRAIN PENROSE 18X1/4 LTX STRL (WOUND CARE) ×3 IMPLANT
DRAPE CAMERA CLOSED 9X96 (DRAPES) ×3 IMPLANT
GAUZE SPONGE 4X4 12PLY STRL (GAUZE/BANDAGES/DRESSINGS) ×3 IMPLANT
GAUZE SPONGE 4X4 16PLY XRAY LF (GAUZE/BANDAGES/DRESSINGS) ×3 IMPLANT
GLOVE BIOGEL M 8.0 STRL (GLOVE) ×6 IMPLANT
GOWN SPEC L4 XLG W/TWL (GOWN DISPOSABLE) ×3 IMPLANT
GOWN STRL REUS W/TWL XL LVL3 (GOWN DISPOSABLE) ×9 IMPLANT
HANDLE STAPLE EGIA 4 XL (STAPLE) ×3 IMPLANT
HOVERMATT SINGLE USE (MISCELLANEOUS) ×3 IMPLANT
KIT BASIN OR (CUSTOM PROCEDURE TRAY) ×3 IMPLANT
KIT GASTRIC LAVAGE 34FR ADT (SET/KITS/TRAYS/PACK) ×3 IMPLANT
LIQUID BAND (GAUZE/BANDAGES/DRESSINGS) ×6 IMPLANT
NEEDLE SPNL 22GX3.5 QUINCKE BK (NEEDLE) ×3 IMPLANT
NS IRRIG 1000ML POUR BTL (IV SOLUTION) ×3 IMPLANT
PACK CARDIOVASCULAR III (CUSTOM PROCEDURE TRAY) ×3 IMPLANT
PEN SKIN MARKING BROAD (MISCELLANEOUS) ×3 IMPLANT
QUICK LOAD TK 5 (STAPLE)
RELOAD EGIA 45 MED/THCK PURPLE (STAPLE) ×3 IMPLANT
RELOAD EGIA 45 TAN VASC (STAPLE) ×3 IMPLANT
RELOAD EGIA 60 MED/THCK PURPLE (STAPLE) ×6 IMPLANT
RELOAD EGIA 60 TAN VASC (STAPLE) ×6 IMPLANT
RELOAD ENDO STITCH 2.0 (ENDOMECHANICALS) ×18
RELOAD TRI 45 ART MED THCK PUR (STAPLE) IMPLANT
RELOAD TRI 60 ART MED THCK PUR (STAPLE) ×3 IMPLANT
SCISSORS LAP 5X45 EPIX DISP (ENDOMECHANICALS) ×3 IMPLANT
SEALANT SURGICAL APPL DUAL CAN (MISCELLANEOUS) ×3 IMPLANT
SET IRRIG TUBING LAPAROSCOPIC (IRRIGATION / IRRIGATOR) ×3 IMPLANT
SHEARS CURVED HARMONIC AC 45CM (MISCELLANEOUS) ×3 IMPLANT
SLEEVE ADV FIXATION 12X100MM (TROCAR) ×9 IMPLANT
SLEEVE ADV FIXATION 5X100MM (TROCAR) IMPLANT
SOLUTION ANTI FOG 6CC (MISCELLANEOUS) ×3 IMPLANT
STAPLER VISISTAT 35W (STAPLE) ×3 IMPLANT
STRIP CLOSURE SKIN 1/2X4 (GAUZE/BANDAGES/DRESSINGS) IMPLANT
SUT RELOAD ENDO STITCH 2 48X1 (ENDOMECHANICALS) ×5
SUT RELOAD ENDO STITCH 2.0 (ENDOMECHANICALS) ×4
SUT SURGIDAC NAB ES-9 0 48 120 (SUTURE) IMPLANT
SUT VIC AB 2-0 SH 27 (SUTURE) ×2
SUT VIC AB 2-0 SH 27X BRD (SUTURE) ×1 IMPLANT
SUT VIC AB 4-0 SH 18 (SUTURE) ×3 IMPLANT
SUTURE RELOAD END STTCH 2 48X1 (ENDOMECHANICALS) ×5 IMPLANT
SUTURE RELOAD ENDO STITCH 2.0 (ENDOMECHANICALS) ×4 IMPLANT
SYR 20CC LL (SYRINGE) ×3 IMPLANT
SYR 30ML LL (SYRINGE) ×3 IMPLANT
SYR 50ML LL SCALE MARK (SYRINGE) ×3 IMPLANT
TOWEL OR 17X26 10 PK STRL BLUE (TOWEL DISPOSABLE) ×6 IMPLANT
TOWEL OR NON WOVEN STRL DISP B (DISPOSABLE) ×3 IMPLANT
TRAY FOLEY CATH 14FRSI W/METER (CATHETERS) ×3 IMPLANT
TROCAR ADV FIXATION 12X100MM (TROCAR) ×3 IMPLANT
TROCAR ADV FIXATION 5X100MM (TROCAR) ×3 IMPLANT
TROCAR BLADELESS OPT 5 100 (ENDOMECHANICALS) ×3 IMPLANT
TROCAR XCEL 12X100 BLDLESS (ENDOMECHANICALS) ×3 IMPLANT
TUBING CONNECTING 10 (TUBING) ×2 IMPLANT
TUBING CONNECTING 10' (TUBING) ×1
TUBING ENDO SMARTCAP PENTAX (MISCELLANEOUS) ×3 IMPLANT
TUBING FILTER THERMOFLATOR (ELECTROSURGICAL) ×3 IMPLANT

## 2014-10-06 NOTE — Plan of Care (Signed)
Problem: Phase I Progression Outcomes Goal: Voiding-avoid urinary catheter unless indicated Outcome: Completed/Met Date Met:  10/06/14     

## 2014-10-06 NOTE — Transfer of Care (Signed)
Immediate Anesthesia Transfer of Care Note  Patient: Kara Crawford  Procedure(s) Performed: Procedure(s): LAPAROSCOPIC ROUX-EN-Y GASTRIC BYPASS WITH UPPER ENDOSCOPY (N/A)  Patient Location: PACU  Anesthesia Type:General  Level of Consciousness: awake, oriented, patient cooperative, lethargic and responds to stimulation  Airway & Oxygen Therapy: Patient Spontanous Breathing and Patient connected to face mask oxygen  Post-op Assessment: Report given to PACU RN, Post -op Vital signs reviewed and stable and Patient moving all extremities  Post vital signs: Reviewed and stable  Complications: No apparent anesthesia complications

## 2014-10-06 NOTE — H&P (View-Only) (Signed)
Chief Complaint:  Morbid obesity and DM  History of Present Illness:  Kara Crawford is an 48 y.o. female who was initially seen by Dr. Newman and [name] that she see me.  I met with her and her husband in the office preoperatively and reviewed Roux-en-Y gastric bypass in detail.  I answered questions mainly posed by her husband since she is very well versed and excited about her upcoming Roux-en-Y gastric bypass.  Since she was seen in our office he has been diagnosed with diabetes and currently takes metformin.  Her upper GI showed no evidence of a hiatal hernia.  Her ultrasound showed her gallbladder wall was slightly thickened.  No gallstones were seen.  She was given a prescription for pain meds postop and also for bowel prep to do preop.  We'll proceed with Roux-en-Y gastric bypass on December 7.  Past Medical History  Diagnosis Date  . Fibroids   . Heart murmur   . Hypertension   . Hyperlipidemia   . Diabetes mellitus without complication   . Anemia     anemia prior to uterine emboliztion procedure.    Past Surgical History  Procedure Laterality Date  . Uterine artery embolization  08-16-2010  . Breath tek h pylori N/A 04/17/2014    Procedure: BREATH TEK H PYLORI;  Surgeon: Kara H Newman, MD;  Location: [facility];  Service: General;  Laterality: N/A;  . Cesarean section      x3- normal development    Current Outpatient Prescriptions  Medication Sig Dispense Refill  . amLODipine (NORVASC) 10 MG tablet Take 10 mg by mouth every morning.     . aspirin 81 MG tablet Take 81 mg by mouth at bedtime.     . atorvastatin (LIPITOR) 80 MG tablet Take 160 mg by mouth every morning.    . Cyanocobalamin (VITAMIN B-12 PO) Take 1 tablet by mouth every morning.    . Ergocalciferol (VITAMIN D2 PO) Take 1 tablet by mouth every morning.    . ibuprofen (ADVIL,MOTRIN) 200 MG tablet Take 400 mg by mouth every 6 (six) hours as needed for headache or moderate pain.    .  lisinopril-hydrochlorothiazide (PRINZIDE,ZESTORETIC) 20-12.5 MG per tablet Take 1 tablet by mouth every morning.     . metoprolol succinate (TOPROL-XL) 25 MG 24 hr tablet Take 25 mg by mouth every morning.     . Multiple Vitamin (MULTIVITAMIN WITH MINERALS) TABS tablet Take 1 tablet by mouth every morning. Women's one-a-day    . sitaGLIPtin-metformin (JANUMET) 50-500 MG per tablet Take 1 tablet by mouth every morning.      No current facility-administered medications for this visit.   Review of patient's allergies indicates no known allergies. Family History  Problem Relation Age of Onset  . Diabetes Mother   . Hypertension Mother   . Stroke Mother   . Diabetes Father   . Hypertension Father    Social History:   reports that she has never smoked. She does not have any smokeless tobacco history on file. She reports that she drinks alcohol. She reports that she does not use illicit drugs.   REVIEW OF SYSTEMS : Negative except for see problem list  Physical Exam:   Last menstrual period 09/17/2014. There is no weight on file to calculate BMI.  Gen:  WDWN AAF NAD  Neurological: Alert and oriented to person, place, and time. Motor and sensory function is grossly intact  Head: Normocephalic and atraumatic.  Eyes: Conjunctivae are normal. Pupils are   equal, round, and reactive to light. No scleral icterus.  Neck: Normal range of motion. Neck supple. No tracheal deviation or thyromegaly present.  Cardiovascular:  SR without murmurs or gallops.  No carotid bruits Breast:  Not examined Respiratory: Effort normal.  No respiratory distress. No chest wall tenderness. Breath sounds normal.  No wheezes, rales or rhonchi.  Abdomen:  Umbilical hernia GU:  Not examined Musculoskeletal: Normal range of motion. Extremities are nontender. No cyanosis, edema or clubbing noted Lymphadenopathy: No cervical, preauricular, postauricular or axillary adenopathy is present Skin: Skin is warm and dry. No rash  noted. No diaphoresis. No erythema. No pallor. Pscyh: Normal mood and affect. Behavior is normal. Judgment and thought content normal.   LABORATORY RESULTS: No results found for this or any previous visit (from the past 48 hour(s)).   RADIOLOGY RESULTS: No results found.  Problem List: Patient Active Problem List   Diagnosis Date Noted  . Diabetes 09/24/2014  . Morbid obesity 03/20/2014    Assessment & Plan: DM and obesity for roux en y gastric bypass    Matt B. Hassell Done, MD, [facility] [phone] beeper [phone]  09/24/2014 4:20 PM

## 2014-10-06 NOTE — Anesthesia Procedure Notes (Signed)
Procedure Name: Intubation Date/Time: 10/06/2014 7:32 AM Performed by: Ofilia Neas Pre-anesthesia Checklist: Patient identified, Timeout performed, Emergency Drugs available, Suction available and Patient being monitored Patient Re-evaluated:Patient Re-evaluated prior to inductionOxygen Delivery Method: Circle system utilized Preoxygenation: Pre-oxygenation with 100% oxygen Intubation Type: IV induction and Cricoid Pressure applied Ventilation: Mask ventilation without difficulty Laryngoscope Size: Mac and 4 Grade View: Grade III Tube type: Oral Tube size: 7.5 mm Number of attempts: 1 Airway Equipment and Method: Stylet Placement Confirmation: ETT inserted through vocal cords under direct vision,  positive ETCO2 and breath sounds checked- equal and bilateral Secured at: 21 cm Tube secured with: Tape Dental Injury: Teeth and Oropharynx as per pre-operative assessment  Difficulty Due To: Difficulty was anticipated, Difficult Airway- due to large tongue, Difficult Airway- due to limited oral opening and Difficult Airway- due to anterior larynx

## 2014-10-06 NOTE — Op Note (Signed)
Kara Crawford [id number] 01/07/1966 10/06/2014  Preoperative diagnosis: morbid obesity  Postoperative diagnosis: Same, distal esophageal submucosal mass   Procedure: Upper endoscopy   Surgeon: Gayland Curry M.D., FACS   Anesthesia: Gen.   Indications for procedure: 48 yo female undergoing a laparoscopic roux en y gastric bypass and an upper endoscopy was requested to evaluate the anastomosis.  Description of procedure: After we have completed the new gastrojejunostomy, I scrubbed out and obtained the Olympus endoscope. I gently placed endoscope in the patient's oropharynx and gently glided it down the esophagus without any difficulty under direct visualization. In the distal esophagus above 2cm above the GE junction at around 38cm there appeared to be a smooth submucosal mass. It was approximately 1cm in size. The overlying mucosa appeared normal. Once I was in the gastric pouch, I insufflated the pouch was air. The pouch was approximately 4 cm in size. I was able to cannulate and advanced the scope through the gastrojejunostomy. Dr.Martin had placed saline in the upper abdomen. Upon further insufflation of the gastric pouch there was no evidence of bubbles. Upon further inspection of the gastric pouch, the mucosa appeared normal. There is no evidence of any mucosal abnormality. The gastric pouch and Roux limb were decompressed. The width of the gastrojejunal anastomosis was at least 2.5 cm. The scope was withdrawn. The patient tolerated this portion of the procedure well. Please see Dr Earlie Server operative note for details regarding the laparoscopic roux-en-y gastric bypass.  Leighton Ruff. Redmond Pulling, MD, FACS General, Bariatric, & Minimally Invasive Surgery [facility], [place]

## 2014-10-06 NOTE — Interval H&P Note (Signed)
History and Physical Interval Note:  10/06/2014 7:16 AM  Kara Crawford  has presented today for surgery, with the diagnosis of Morbid Obesity  The various methods of treatment have been discussed with the patient and family. After consideration of risks, benefits and other options for treatment, the patient has consented to  Procedure(s): LAPAROSCOPIC ROUX-EN-Y GASTRIC BYPASS WITH UPPER ENDOSCOPY (N/A) as a surgical intervention .  The patient's history has been reviewed, patient examined, no change in status, stable for surgery.  I have reviewed the patient's chart and labs.  Questions were answered to the patient's satisfaction.     Colbey Wirtanen B

## 2014-10-06 NOTE — Op Note (Signed)
Surgeon: Althea Grimmer. Hassell Done, MD, FACS Asst:  Greer Pickerel Anesthesia: General endotracheal Drains: None  Procedure: Laparoscopic Roux en Y gastric bypass with 40 cm BP limb and 100 cm Roux limb, antecolic, antegastric, candy cane to the left.  Closure of Peterson's defect. Upper endoscopy-showed a 4 cm pouch and a distal esophageal submucosal mass or lipoma.   Description of Procedure:  The patient was taken to OR 1 at [facility] and given general anesthesia.  The abdomen was prepped with PCMX and draped sterilely.  A time out was performed.  Access was achieved with 11 mm Optiview throught the left upper quadrant.  She has a very protuberant abdomen with a large left lateral segment.    The operation began by identifying the ligament of Treitz. I measured 40 cm downstream and divided the bowel with a 6 cm Covidian stapler.  I sutured a Penrose drain along the Roux limb end.  I measured a 1 meter (100 cm) Roux limb and then placed the distal bowels to the BP limb side by side and performed a stapled jejunojejunostomy with a 6 cm brown stapler. The common defect was closed from either end with 4-0 Vicryl using the Endo Stitch. The mesenteric defect was closed with a running 2-0 silk using the Endo360. Tisseel was applied to the suture line.  The omentum was divided with the harmonic scalpel.  The Nathanson retractor was inserted in the left lateral segment of liver was retracted. The foregut dissection ensued.  A 4 cm pouch was created with two plain purple loads 6 cm followed by 2 applications with TRS.    The Roux limb was then brought up with the candycane pointed left and a back row of sutures of 2-0 Vicryl were placed. I opened along the right side of each structure and inserted the 4.5 cm stapler to create the gastrojejunostomy. The common defect was closed from either end with 2-0 Vicryl and a second row was placed anterior to that the Ewald tube acting as a stent across the anastomosis. The Penrose drain  was removed. Peterson's defect was closed with 2-0 silk and Endo360.   Endoscopy was performed by Dr. Redmond Pulling.  A small submucosal mass was noted and 4 cm pouch seen without bleeding or bubbles.   The incisions were injected with Exaparel and were closed with 4-0 Vicryl and Dermabond equivalent  The patient was taken to the recovery room in satisfactory condition.  Matt B. Hassell Done, MD, FACS

## 2014-10-06 NOTE — Anesthesia Postprocedure Evaluation (Signed)
Anesthesia Post Note  Patient: Kara Crawford  Procedure(s) Performed: Procedure(s) (LRB): LAPAROSCOPIC ROUX-EN-Y GASTRIC BYPASS WITH UPPER ENDOSCOPY (N/A)  Anesthesia type: General  Patient location: PACU  Post pain: Pain level controlled  Post assessment: Post-op Vital signs reviewed  Last Vitals: BP 128/59 mmHg  Pulse 90  Temp(Src) 37.2 C (Oral)  Resp 20  Ht 5' (1.524 m)  Wt 234 lb 6 oz (106.312 kg)  BMI 45.77 kg/m2  SpO2 95%  LMP 09/17/2014  Post vital signs: Reviewed  Level of consciousness: sedated  Complications: No apparent anesthesia complications

## 2014-10-07 ENCOUNTER — Encounter (HOSPITAL_COMMUNITY): Payer: Self-pay | Admitting: Surgery

## 2014-10-07 ENCOUNTER — Inpatient Hospital Stay (HOSPITAL_COMMUNITY): Payer: 59

## 2014-10-07 LAB — CBC WITH DIFFERENTIAL/PLATELET
BASOS PCT: 0 % (ref 0–1)
Basophils Absolute: 0 10*3/uL (ref 0.0–0.1)
EOS PCT: 0 % (ref 0–5)
Eosinophils Absolute: 0 10*3/uL (ref 0.0–0.7)
HEMATOCRIT: 39.4 % (ref 36.0–46.0)
HEMOGLOBIN: 13 g/dL (ref 12.0–15.0)
Lymphocytes Relative: 12 % (ref 12–46)
Lymphs Abs: 1 10*3/uL (ref 0.7–4.0)
MCH: 30.3 pg (ref 26.0–34.0)
MCHC: 33 g/dL (ref 30.0–36.0)
MCV: 91.8 fL (ref 78.0–100.0)
MONO ABS: 0.5 10*3/uL (ref 0.1–1.0)
Monocytes Relative: 6 % (ref 3–12)
NEUTROS ABS: 6.8 10*3/uL (ref 1.7–7.7)
Neutrophils Relative %: 82 % — ABNORMAL HIGH (ref 43–77)
Platelets: 328 10*3/uL (ref 150–400)
RBC: 4.29 MIL/uL (ref 3.87–5.11)
RDW: 12.7 % (ref 11.5–15.5)
WBC: 8.3 10*3/uL (ref 4.0–10.5)

## 2014-10-07 LAB — HEMOGLOBIN AND HEMATOCRIT, BLOOD
HCT: 39.6 % (ref 36.0–46.0)
Hemoglobin: 13.3 g/dL (ref 12.0–15.0)

## 2014-10-07 MED ORDER — IOHEXOL 300 MG/ML  SOLN
50.0000 mL | Freq: Once | INTRAMUSCULAR | Status: AC | PRN
  Administered 2014-10-07: 20 mL via ORAL

## 2014-10-07 NOTE — Plan of Care (Signed)
Problem: Phase I Progression Outcomes Goal: CPAP/BI-PAP utilized with sleeping per order Outcome: Not Applicable Date Met:  10/07/14     

## 2014-10-07 NOTE — Progress Notes (Signed)
Patient alert and oriented, pain is controlled. Patient is tolerating fluids, plan to advance to protein shake tomorrow. Reviewed Gastric Bypass discharge instructions with patient and patient is able to articulate understanding. Provided information on BELT program, Support Group and [facility] outpatient pharmacy. All questions answered, will continue to monitor.    

## 2014-10-07 NOTE — Progress Notes (Signed)
Patient alert and oriented, Post op day 1.  Provided support and encouragement.  Encouraged pulmonary toilet, ambulation and small sips of liquids.  All questions answered.  Will continue to monitor. 

## 2014-10-07 NOTE — Plan of Care (Signed)
Problem: Phase II Progression Outcomes Goal: Upper GI SWALLOW ACCEPTABLE Outcome: Completed/Met Date Met:  10/07/14     

## 2014-10-07 NOTE — Plan of Care (Signed)
Problem: Phase I Progression Outcomes Goal: OOB as tolerated unless otherwise ordered Outcome: Completed/Met Date Met:  10/07/14     

## 2014-10-07 NOTE — Plan of Care (Signed)
Problem: Phase I Progression Outcomes Goal: Initial discharge plan identified Outcome: Completed/Met Date Met:  10/07/14     

## 2014-10-07 NOTE — Plan of Care (Signed)
Problem: Phase II Progression Outcomes Goal: Activity at appropriate level-compared to baseline (UP IN CHAIR FOR HEMODIALYSIS)  Outcome: Completed/Met Date Met:  10/07/14     

## 2014-10-07 NOTE — Plan of Care (Signed)
Problem: Phase II Progression Outcomes Goal: Pain controlled on oral analgesia Outcome: Completed/Met Date Met:  10/07/14     

## 2014-10-07 NOTE — Plan of Care (Signed)
Problem: Phase I Progression Outcomes Goal: Diet - NPO Outcome: Completed/Met Date Met:  10/07/14     

## 2014-10-07 NOTE — Plan of Care (Signed)
Problem: Food- and Nutrition-Related Knowledge Deficit (NB-1.1) Goal: Nutrition education Formal process to instruct or train a patient/client in a skill or to impart knowledge to help patients/clients voluntarily manage or modify food choices and eating behavior to maintain or improve health. Outcome: Completed/Met Date Met:  10/07/14 Nutrition Education Note  Received consult for diet education per DROP protocol.   12/7 s/p LAPAROSCOPIC ROUX-EN-Y GASTRIC BYPASS WITH UPPER ENDOSCOPY   Discussed 2 week post op diet with pt. Emphasized that liquids must be non carbonated, non caffeinated, and sugar free. Fluid goals discussed. Pt to follow up with outpatient bariatric RD for further diet progression after 2 weeks. Multivitamins and minerals also reviewed. Teach back method used, pt expressed understanding, expect good compliance.   Diet: First 2 Weeks  You will see the nutritionist about two (2) weeks after your surgery. The nutritionist will increase the types of foods you can eat if you are handling liquids well:  If you have severe vomiting or nausea and cannot handle clear liquids lasting longer than 1 day, call your surgeon  Protein Shake  Drink at least 2 ounces of shake 5-6 times per day  Each serving of protein shakes (usually 8 - 12 ounces) should have a minimum of:  15 grams of protein  And no more than 5 grams of carbohydrate  Goal for protein each day:  Men = 80 grams per day  Women = 60 grams per day  Protein powder may be added to fluids such as non-fat milk or Lactaid milk or Soy milk (limit to 35 grams added protein powder per serving)   Hydration  Slowly increase the amount of water and other clear liquids as tolerated (See Acceptable Fluids)  Slowly increase the amount of protein shake as tolerated  Sip fluids slowly and throughout the day  May use sugar substitutes in small amounts (no more than 6 - 8 packets per day; i.e. Splenda)   Fluid Goal  The first goal is to  drink at least 8 ounces of protein shake/drink per day (or as directed by the nutritionist); some examples of protein shakes are [employer], [employer]R [employer], EAS Edge HP, and Unjury. See handout from pre-op Bariatric Education Class:  Slowly increase the amount of protein shake you drink as tolerated  You may find it easier to slowly sip shakes throughout the day  It is important to get your proteins in first  Your fluid goal is to drink 64 - 100 ounces of fluid daily  It may take a few weeks to build up to this  32 oz (or more) should be clear liquids  And  32 oz (or more) should be full liquids (see below for examples)  Liquids should not contain sugar, caffeine, or carbonation   Clear Liquids:  Water or Sugar-free flavored water (i.e. Fruit H2O, Propel)  Decaffeinated coffee or tea (sugar-free)  Crystal Lite, Wyler's Lite, Minute Maid Lite  Sugar-free Jell-O  Bouillon or broth  Sugar-free Popsicle: *Less than 20 calories each; Limit 1 per day   Full Liquids:  Protein Shakes/Drinks + 2 choices per day of other full liquids  Full liquids must be:  No More Than 12 grams of Carbs per serving  No More Than 3 grams of Fat per serving  Strained low-fat cream soup  Non-Fat milk  Fat-free Lactaid Milk  Sugar-free yogurt (Dannon Lite & Fit, Greek yogurt)     Clayton Bibles, MS, RD, LDN Pager: [phone] After Hours Pager: [phone]

## 2014-10-07 NOTE — Plan of Care (Signed)
Problem: Phase II Progression Outcomes Goal: Surgical site without signs of infection Outcome: Completed/Met Date Met:  10/07/14     

## 2014-10-07 NOTE — Plan of Care (Signed)
Problem: Phase I Progression Outcomes Goal: Pulmonary function stable Outcome: Completed/Met Date Met:  10/07/14

## 2014-10-07 NOTE — Progress Notes (Signed)
Patient ID: Kara Crawford, female   DOB: [birth year], 48 y.o.   MRN: [id number] [facility] Progress Note:   1 Day Post-Op  Subjective: Mental status is clear Objective: Vital signs in last 24 hours: Temp:  [98.9 F (37.2 C)-100.3 F (37.9 C)] 99.2 F (37.3 C) (12/08 1400) Pulse Rate:  [78-91] 90 (12/08 1400) Resp:  [18] 18 (12/08 1400) BP: (128-178)/(69-97) 178/95 mmHg (12/08 1400) SpO2:  [93 %-99 %] 94 % (12/08 1400) Weight:  [230 lb 13.2 oz (104.7 kg)] 230 lb 13.2 oz (104.7 kg) (12/08 0507)  Intake/Output from previous day: 12/07 0701 - 12/08 0700 In: 3460 [I.V.:3460] Out: 3800 [Urine:3800] Intake/Output this shift: Total I/O In: -  Out: 700 [Urine:700]  Physical Exam: Work of breathing is normal. Abdomen with minimal pain  Lab Results:  Results for orders placed or performed during the hospital encounter of 10/06/14 (from the past 48 hour(s))  Pregnancy, urine STAT morning of surgery     Status: None   Collection Time: 10/06/14  5:55 AM  Result Value Ref Range   Preg Test, Ur NEGATIVE NEGATIVE    Comment:        THE SENSITIVITY OF THIS METHODOLOGY IS >20 mIU/mL.   CBC WITH DIFFERENTIAL     Status: None   Collection Time: 10/06/14  6:15 AM  Result Value Ref Range   WBC 5.5 4.0 - 10.5 K/uL   RBC 3.96 3.87 - 5.11 MIL/uL   Hemoglobin 12.1 12.0 - 15.0 g/dL   HCT 36.7 36.0 - 46.0 %   MCV 92.7 78.0 - 100.0 fL   MCH 30.6 26.0 - 34.0 pg   MCHC 33.0 30.0 - 36.0 g/dL   RDW 12.7 11.5 - 15.5 %   Platelets 293 150 - 400 K/uL   Neutrophils Relative % 65 43 - 77 %   Neutro Abs 3.6 1.7 - 7.7 K/uL   Lymphocytes Relative 30 12 - 46 %   Lymphs Abs 1.6 0.7 - 4.0 K/uL   Monocytes Relative 4 3 - 12 %   Monocytes Absolute 0.2 0.1 - 1.0 K/uL   Eosinophils Relative 1 0 - 5 %   Eosinophils Absolute 0.0 0.0 - 0.7 K/uL   Basophils Relative 0 0 - 1 %   Basophils Absolute 0.0 0.0 - 0.1 K/uL  Comprehensive metabolic panel     Status: Abnormal   Collection Time: 10/06/14  6:15  AM  Result Value Ref Range   Sodium 134 (L) 137 - 147 mEq/L   Potassium 3.3 (L) 3.7 - 5.3 mEq/L   Chloride 94 (L) 96 - 112 mEq/L   CO2 24 19 - 32 mEq/L   Glucose, Bld 154 (H) 70 - 99 mg/dL   BUN 8 6 - 23 mg/dL   Creatinine, Ser 0.82 0.50 - 1.10 mg/dL   Calcium 10.2 8.4 - 10.5 mg/dL   Total Protein 8.6 (H) 6.0 - 8.3 g/dL   Albumin 4.6 3.5 - 5.2 g/dL   AST 57 (H) 0 - 37 U/L   ALT 38 (H) 0 - 35 U/L   Alkaline Phosphatase 56 39 - 117 U/L   Total Bilirubin 0.7 0.3 - 1.2 mg/dL   GFR calc non Af Amer 83 (L) >90 mL/min   GFR calc Af Amer >90 >90 mL/min    Comment: (NOTE) The eGFR has been calculated using the CKD EPI equation. This calculation has not been validated in all clinical situations. eGFR's persistently <90 mL/min signify possible Chronic Kidney Disease.  Anion gap 16 (H) 5 - 15  Glucose, capillary     Status: Abnormal   Collection Time: 10/06/14 11:06 AM  Result Value Ref Range   Glucose-Capillary 219 (H) 70 - 99 mg/dL   Comment 1 Documented in Chart    Comment 2 Notify RN   Hemoglobin and hematocrit, blood     Status: None   Collection Time: 10/06/14 11:21 AM  Result Value Ref Range   Hemoglobin DUPLICATE REQUEST 76.2 - 15.0 g/dL    Comment: SEE [id number] CORRECTED ON 12/07 AT 1304: PREVIOUSLY REPORTED AS 61.6    HCT DUPLICATE REQUEST 07.3 - 46.0 %    Comment: SEE [id number] CORRECTED ON 12/07 AT 1304: PREVIOUSLY REPORTED AS 36.0   CBC     Status: Abnormal   Collection Time: 10/06/14 12:39 PM  Result Value Ref Range   WBC 11.5 (H) 4.0 - 10.5 K/uL   RBC 3.90 3.87 - 5.11 MIL/uL   Hemoglobin 11.9 (L) 12.0 - 15.0 g/dL   HCT 36.0 36.0 - 46.0 %   MCV 92.3 78.0 - 100.0 fL   MCH 30.5 26.0 - 34.0 pg   MCHC 33.1 30.0 - 36.0 g/dL   RDW 12.8 11.5 - 15.5 %   Platelets 321 150 - 400 K/uL  Creatinine, serum     Status: None   Collection Time: 10/06/14 12:39 PM  Result Value Ref Range   Creatinine, Ser 0.79 0.50 - 1.10 mg/dL   GFR calc non Af Amer >90 >90 mL/min   GFR calc  Af Amer >90 >90 mL/min    Comment: (NOTE) The eGFR has been calculated using the CKD EPI equation. This calculation has not been validated in all clinical situations. eGFR's persistently <90 mL/min signify possible Chronic Kidney Disease.   CBC WITH DIFFERENTIAL     Status: Abnormal   Collection Time: 10/07/14  5:05 AM  Result Value Ref Range   WBC 8.3 4.0 - 10.5 K/uL   RBC 4.29 3.87 - 5.11 MIL/uL   Hemoglobin 13.0 12.0 - 15.0 g/dL   HCT 39.4 36.0 - 46.0 %   MCV 91.8 78.0 - 100.0 fL   MCH 30.3 26.0 - 34.0 pg   MCHC 33.0 30.0 - 36.0 g/dL   RDW 12.7 11.5 - 15.5 %   Platelets 328 150 - 400 K/uL   Neutrophils Relative % 82 (H) 43 - 77 %   Neutro Abs 6.8 1.7 - 7.7 K/uL   Lymphocytes Relative 12 12 - 46 %   Lymphs Abs 1.0 0.7 - 4.0 K/uL   Monocytes Relative 6 3 - 12 %   Monocytes Absolute 0.5 0.1 - 1.0 K/uL   Eosinophils Relative 0 0 - 5 %   Eosinophils Absolute 0.0 0.0 - 0.7 K/uL   Basophils Relative 0 0 - 1 %   Basophils Absolute 0.0 0.0 - 0.1 K/uL    Radiology/Results: Dg Ugi W/water Sol Cm  10/07/2014   CLINICAL DATA:  Postop gastric bypass surgery.  EXAM: WATER SOLUBLE UPPER GI SERIES  TECHNIQUE: Single-column upper GI series was performed using water soluble contrast.  CONTRAST:  20 cc Omnipaque  COMPARISON:  Upper GI 05/16/2014  FLUOROSCOPY TIME:  1 min 10 seconds  FINDINGS: Water-soluble contrast flows readily through the gastroesophageal junction into the gastric remnant. Water-soluble contrast flows readily through the gastro jejunostomy without evidence obstruction or leak. There is reflux into the esophagus with mild stasis of esophageal contents.  IMPRESSION: 1. No evidence of obstruction or leak following Roux-en-  Y gastric bypass surgery. 2. Mild reflux and stasis of esophageal contents.   Electronically Signed   By: Suzy Bouchard M.D.   On: 10/07/2014 09:27    Anti-infectives: Anti-infectives    Start     Dose/Rate Route Frequency Ordered Stop   10/06/14 0557   cefOXitin (MEFOXIN) 2 g in dextrose 5 % 50 mL IVPB     2 g100 mL/hr over 30 Minutes Intravenous On call to O.R. 10/06/14 0557 10/06/14 0915      Assessment/Plan: Problem List: Patient Active Problem List   Diagnosis Date Noted  . Submucosal lesion of esophagus 10/06/2014  . 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 Diabetes 09/24/2014  . Morbid obesity 03/20/2014    UGI OK.  Start po day 1 diet 1 Day Post-Op    LOS: 1 day   Matt B. Hassell Done, MD, [facility] [phone] beeper [phone]  10/07/2014 3:29 PM

## 2014-10-07 NOTE — Plan of Care (Signed)
Problem: Phase I Progression Outcomes Goal: Pain controlled with appropriate interventions Outcome: Completed/Met Date Met:  10/07/14     

## 2014-10-07 NOTE — Plan of Care (Signed)
Problem: Phase I Progression Outcomes Goal: Bariatric bed/trapeze per MD Outcome: Not Applicable Date Met:  48/25/00

## 2014-10-07 NOTE — Discharge Instructions (Signed)

## 2014-10-07 NOTE — Plan of Care (Signed)
Problem: Phase I Progression Outcomes Goal: Operative site clean or minimal drainage Outcome: Completed/Met Date Met:  10/07/14     

## 2014-10-08 LAB — CBC WITH DIFFERENTIAL/PLATELET
Basophils Absolute: 0 10*3/uL (ref 0.0–0.1)
Basophils Relative: 0 % (ref 0–1)
Eosinophils Absolute: 0 10*3/uL (ref 0.0–0.7)
Eosinophils Relative: 0 % (ref 0–5)
HEMATOCRIT: 37.4 % (ref 36.0–46.0)
Hemoglobin: 12.4 g/dL (ref 12.0–15.0)
LYMPHS PCT: 17 % (ref 12–46)
Lymphs Abs: 1.2 10*3/uL (ref 0.7–4.0)
MCH: 30.9 pg (ref 26.0–34.0)
MCHC: 33.2 g/dL (ref 30.0–36.0)
MCV: 93.3 fL (ref 78.0–100.0)
Monocytes Absolute: 0.4 10*3/uL (ref 0.1–1.0)
Monocytes Relative: 6 % (ref 3–12)
Neutro Abs: 5.4 10*3/uL (ref 1.7–7.7)
Neutrophils Relative %: 77 % (ref 43–77)
Platelets: 293 10*3/uL (ref 150–400)
RBC: 4.01 MIL/uL (ref 3.87–5.11)
RDW: 12.6 % (ref 11.5–15.5)
WBC: 7 10*3/uL (ref 4.0–10.5)

## 2014-10-08 NOTE — Discharge Summary (Signed)
Physician Discharge Summary  Patient ID: Kara Crawford MRN: [id number] DOB/AGE: 04-Nov-1965 48 y.o.  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  UGI looked OK. Doing well and ready for discharge  Consults: none  Significant Diagnostic Studies: UGI-normal post bypass    Discharge Exam: Blood pressure 137/87, pulse 83, temperature 98.6 F (37 C), temperature source Oral, resp. rate 18, height 5' (1.524 m), weight 242 lb 8 oz (109.997 kg), last menstrual period 09/17/2014, SpO2 96 %. Incisions OK  Disposition: 01-Home or Self Care  Discharge Instructions    Diet - low sodium heart healthy    Complete by:  As directed      Discharge instructions    Complete by:  As directed   Follow bariatric dietary instructions     Increase activity slowly    Complete by:  As directed             Medication List    TAKE these medications        amLODipine 10 MG tablet  Commonly known as:  NORVASC  Take 10 mg by mouth every morning.     aspirin 81 MG tablet  Take 81 mg by mouth at bedtime.     atorvastatin 80 MG tablet  Commonly known as:  LIPITOR  Take 160 mg by mouth every morning.     ibuprofen 200 MG tablet  Commonly known as:  ADVIL,MOTRIN  Take 400 mg by mouth every 6 (six) hours as needed for headache or moderate pain.     lisinopril-hydrochlorothiazide 20-12.5 MG per tablet  Commonly known as:  PRINZIDE,ZESTORETIC  Take 1 tablet by mouth every morning.     metoprolol succinate 25 MG 24 hr tablet  Commonly known as:  TOPROL-XL  Take 25 mg by mouth every morning.     multivitamin with minerals Tabs tablet  Take 1 tablet by mouth  every morning. Women's one-a-day     sitaGLIPtin-metformin 50-500 MG per tablet  Commonly known as:  JANUMET  Take 1 tablet by mouth every morning.     VITAMIN B-12 PO  Take 1 tablet by mouth every morning.     VITAMIN D2 PO  Take 1 tablet by mouth every morning.           Follow-up Information    Follow up with Pedro Earls, MD.   Specialty:  General Surgery   Contact information:   [address] [place] [address] [phone]       Signed: Pedro Earls 10/08/2014, 8:03 AM

## 2014-10-08 NOTE — Discharge Summary (Signed)
Physician Discharge Summary  Patient ID: Kara Crawford MRN: [id number] DOB/AGE: [birth year] 48 y.o.  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  UGI OK.  Ready for discharge on PD 2  Consults:  Significant Diagnostic Studies: UGI    Discharge Exam: Blood pressure 137/87, pulse 83, temperature 98.6 F (37 C), temperature source Oral, resp. rate 18, height 5' (1.524 m), weight 242 lb 8 oz (109.997 kg), last menstrual period 09/17/2014, SpO2 96 %. Incisions OK.    Disposition: 01-Home or Self Care  Discharge Instructions    Diet - low sodium heart healthy    Complete by:  As directed      Discharge instructions    Complete by:  As directed   Follow bariatric dietary instructions     Increase activity slowly    Complete by:  As directed             Medication List    TAKE these medications        amLODipine 10 MG tablet  Commonly known as:  NORVASC  Take 10 mg by mouth every morning.     aspirin 81 MG tablet  Take 81 mg by mouth at bedtime.     atorvastatin 80 MG tablet  Commonly known as:  LIPITOR  Take 160 mg by mouth every morning.     ibuprofen 200 MG tablet  Commonly known as:  ADVIL,MOTRIN  Take 400 mg by mouth every 6 (six) hours as needed for headache or moderate pain.     lisinopril-hydrochlorothiazide 20-12.5 MG per tablet  Commonly known as:  PRINZIDE,ZESTORETIC  Take 1 tablet by mouth every morning.     metoprolol succinate 25 MG 24 hr tablet  Commonly known as:  TOPROL-XL  Take 25 mg by mouth every morning.     multivitamin with minerals Tabs tablet  Take 1 tablet by mouth every morning. Women's one-a-day     sitaGLIPtin-metformin 50-500 MG per tablet  Commonly known as:  JANUMET  Take 1 tablet by mouth every morning.     VITAMIN B-12 PO  Take 1 tablet by mouth every morning.     VITAMIN D2 PO  Take 1 tablet by mouth every morning.           Follow-up Information    Follow up with Pedro Earls, MD.   Specialty:  General Surgery   Contact information:   [address] [place] [address] [phone]       Signed: Pedro Earls 10/08/2014, 8:06 AM

## 2014-10-13 ENCOUNTER — Telehealth (HOSPITAL_COMMUNITY): Payer: Self-pay

## 2014-10-13 NOTE — Telephone Encounter (Signed)
No answer, left message for patient to return call  Made discharge phone call to patient per DROP protocol. Asking the following questions.    1. Do you have someone to care for you now that you are home?   2. Are you having pain now that is not relieved by your pain medication?   3. Are you able to drink the recommended daily amount of fluids (48 ounces minimum/day) and protein (60-80 grams/day) as prescribed by the dietitian or nutritional counselor?   4. Are you taking the vitamins and minerals as prescribed?   5. Do you have the "on call" number to contact your surgeon if you have a problem or question?   6. Are your incisions free of redness, swelling or drainage? (If steri strips, address that these can fall off, shower as tolerated)  7. Have your bowels moved since your surgery?  If not, are you passing gas?   8. Are you up and walking 3-4 times per day?      1. Do you have an appointment made to see your surgeon in the next month?   2. Were you provided your discharge medications before your surgery or before you were discharged from the hospital and are you taking them without problem?   3. Were you provided phone numbers to the clinic/surgeon's office?   4. Did you watch the patient education video module in the (clinic, surgeon's office, etc.) before your surgery?  5. Do you have a discharge checklist that was provided to you in the hospital to reference with instructions on how to take care of yourself after surgery?   6. Did you see a dietitian or nutritional counselor while you were in the hospital?   7. Do you have an appointment to see a dietitian or nutritional counselor in the next month?

## 2014-10-21 ENCOUNTER — Encounter: Payer: 59 | Attending: Surgery

## 2014-10-21 DIAGNOSIS — Z713 Dietary counseling and surveillance: Secondary | ICD-10-CM | POA: Diagnosis not present

## 2014-10-21 DIAGNOSIS — Z6841 Body Mass Index (BMI) 40.0 and over, adult: Secondary | ICD-10-CM | POA: Diagnosis not present

## 2014-10-21 NOTE — Progress Notes (Signed)
Bariatric Class:  Appt start time: 1530 end time:  1630.  2 Week Post-Operative Nutrition Class  Patient was seen on 10/21/14 for Post-Operative Nutrition education at [facility].   Surgery date: 10/06/2014 Surgery type: RYGB Start weight at [facility]: 242 lbs on 05/06/14 Weight today: 216.5 lbs  Weight change: 20.5 lbs  TANITA  BODY COMP RESULTS  09/22/14 10/21/14   BMI (kg/m^2) 44.8 40.9   Fat Mass (lbs) 115.5 107.0   Fat Free Mass (lbs) 121.5 109.5   Total Body Water (lbs) 89 80.0    The following the learning objectives were met by the patient during this course:  Identifies Phase 3A (Soft, High Proteins) Dietary Goals and will begin from 2 weeks post-operatively to 2 months post-operatively  Identifies appropriate sources of fluids and proteins   States protein recommendations and appropriate sources post-operatively  Identifies the need for appropriate texture modifications, mastication, and bite sizes when consuming solids  Identifies appropriate multivitamin and calcium sources post-operatively  Describes the need for physical activity post-operatively and will follow MD recommendations  States when to call healthcare provider regarding medication questions or post-operative complications  Handouts given during class include:  Phase 3A: Soft, High Protein Diet Handout  Follow-Up Plan: Patient will follow-up at [facility] in 6 weeks for 2 month post-op nutrition visit for diet advancement per MD.

## 2014-11-25 ENCOUNTER — Other Ambulatory Visit: Payer: Self-pay | Admitting: Radiology

## 2014-12-02 ENCOUNTER — Encounter: Payer: 59 | Attending: Surgery | Admitting: Dietician

## 2014-12-02 DIAGNOSIS — Z713 Dietary counseling and surveillance: Secondary | ICD-10-CM | POA: Insufficient documentation

## 2014-12-02 DIAGNOSIS — Z6841 Body Mass Index (BMI) 40.0 and over, adult: Secondary | ICD-10-CM | POA: Insufficient documentation

## 2014-12-02 NOTE — Progress Notes (Signed)
  Follow-up visit:  8 Weeks Post-Operative RYGB Surgery  Medical Nutrition Therapy:  Appt start time: 6286 end time:  1050.  Primary concerns today: Post-operative Bariatric Surgery Nutrition Management. Returns with a 16.5 fat mass loss. Can drink more water than she used to. States that she eats ice which she started in the hospital and was craving chewing something. Not chewing ice. Tolerating the foods she is eating and it is getting much better.   Surgery date: 10/06/2014 Surgery type: RYGB Start weight at [facility]: 242 lbs on 05/06/14 Weight today: 207.0 lbs  Weight change: 9.5 lbs, 16.5 lbs fat loss Total weigh loss 30 lbs  TANITA  BODY COMP RESULTS  09/22/14 10/21/14 12/02/14   BMI (kg/m^2) 44.8 40.9 39.1   Fat Mass (lbs) 115.5 107.0 90.5   Fat Free Mass (lbs) 121.5 109.5 116.5   Total Body Water (lbs) 89 80.0 85.5    Preferred Learning Style:   No preference indicated   Learning Readiness:   Ready  24-hr recall: B (AM): yogurt Activia [place] Yogurt or just vegetables (squash) (0-6g) Snk (AM): SF Jello or SF popsicle  L (PM): spaghetti squash with 1 oz shrimp (7 g) Snk (PM): cheese with [place] or Atkins shake (14-15 g) D (PM): green beans, 3 oz chicken or fish (21 g) Snk (PM): SF Popsicle  Fluid intake: 64 + oz water, Diet Green Tea, Decaf coffee Estimated total protein intake: 42-48 g   Medications: see list Supplementation: taking  Using straws: No Drinking while eating: did a couple times because she forgot Hair loss: No Carbonated beverages: No N/V/D/C: was constipated until started Activia and green tea (good now) Dumping syndrome: No  Recent physical activity:  Exercise every day - gym, zumba, treadmill, weights for about 45-60 minutes  Progress Towards Goal(s):  In progress.  Handouts given during visit include:  High Protein + Non Starchy Vegetables    Nutritional Diagnosis:  [place]-3.3 Overweight/obesity related to past poor dietary habits and physical  inactivity as evidenced by patient w/ recent RYGB surgery following dietary guidelines for continued weight loss.    Intervention:  Nutrition education/diet advancement.  Teaching Method Utilized:  Visual Auditory Hands on  Barriers to learning/adherence to lifestyle change: none  Demonstrated degree of understanding via:  Teach Back   Monitoring/Evaluation:  Dietary intake, exercise, and body weight. Follow up in 1 months for 3 month post-op visit.

## 2014-12-02 NOTE — Patient Instructions (Addendum)
Goals:  Follow Phase 3B: High Protein + Non-Starchy Vegetables  Eat 3-6 small meals/snacks, every 3-5 hrs  Increase lean protein foods to meet 60g goal  Increase fluid intake to 64oz +  Avoid drinking 15 minutes before, during and 30 minutes after eating  Aim for >30 min of physical activity daily  Try activia greek light  Eat protein first, about 2-3 oz per meal (up to a deck of cards size)

## 2014-12-31 ENCOUNTER — Encounter: Payer: 59 | Attending: Surgery | Admitting: Dietician

## 2014-12-31 DIAGNOSIS — Z6841 Body Mass Index (BMI) 40.0 and over, adult: Secondary | ICD-10-CM | POA: Insufficient documentation

## 2014-12-31 DIAGNOSIS — Z713 Dietary counseling and surveillance: Secondary | ICD-10-CM | POA: Diagnosis not present

## 2014-12-31 NOTE — Patient Instructions (Addendum)
Goals:  Follow Phase 3B: High Protein + Non-Starchy Vegetables  Eat 3-6 small meals/snacks, every 3-5 hrs  Increase lean protein foods to meet 60g goal - try Premier Protein shake  Increase fluid intake to 64oz +  Avoid drinking 15 minutes before, during and 30 minutes after eating  Aim for >30 min of physical activity daily  Eat protein first, about 2-3 oz per meal (up to a deck of cards size)  Start writing down what you eat for the next more  Try using a protein shake to flavor coffee

## 2014-12-31 NOTE — Progress Notes (Signed)
  Follow-up visit:  12 Weeks Post-Operative RYGB Surgery  Medical Nutrition Therapy:  Appt start time: 840 end time:  905.  Primary concerns today: Post-operative Bariatric Surgery Nutrition Management. Returns with a 4 lb loss. Starting to crave to fruit, peanut butter, and crackers. Had some candy on Valentine's Day, had some popcorn at the movies, and had a small amount of wine.  Surgery date: 10/06/2014 Surgery type: RYGB Start weight at [facility]: 242 lbs on 05/06/14 Weight today: 203.0 lbs  Weight change: 4 lbs  Total weigh loss 39 lbs  TANITA  BODY COMP RESULTS  09/22/14 10/21/14 12/02/14 12/31/14   BMI (kg/m^2) 44.8 40.9 39.1 38.4   Fat Mass (lbs) 115.5 107.0 90.5 89.5   Fat Free Mass (lbs) 121.5 109.5 116.5 113.5   Total Body Water (lbs) 89 80.0 85.5 83.0    Preferred Learning Style:   No preference indicated   Learning Readiness:   Ready  24-hr recall: B (AM): egg or sausage with vegetable (6 g) Snk (AM): SF Jello or SF popsicle  L (PM): [place], cheese, spinach or 2 oz shrimp or salad with frozen protein vegetables or Atkins shake (14 g) Snk (PM): cheese with [place] or Atkins shake (14-15 g) D (PM): green beans, 3 oz fish (21 g) Snk (PM): SF Popsicle  Fluid intake: 64 + oz water, Diet Green Tea 2-3 x week, Decaf coffee with splenda and flavored creamer, G2, 1 protein shake per day Estimated total protein intake: 55 g   Medications: see list Supplementation: taking  Using straws: No Drinking while eating: did a couple times because she forgot Hair loss: No Carbonated beverages: No N/V/D/C: No Dumping syndrome: No  Recent physical activity:  Exercise every day - gym, zumba, treadmill, weights for about 45-60 minutes  Progress Towards Goal(s):  In progress.    Nutritional Diagnosis:  -3.3 Overweight/obesity related to past poor dietary habits and physical inactivity as evidenced by patient w/ recent RYGB surgery following dietary guidelines for continued weight  loss.    Intervention:  Nutrition education/diet reinforcment Goals:  Follow Phase 3B: High Protein + Non-Starchy Vegetables  Eat 3-6 small meals/snacks, every 3-5 hrs  Increase lean protein foods to meet 60g goal - try Premier Protein shake  Increase fluid intake to 64oz +  Avoid drinking 15 minutes before, during and 30 minutes after eating  Aim for >30 min of physical activity daily  Eat protein first, about 2-3 oz per meal (up to a deck of cards size)  Start writing down what you eat for the next more  Try using a protein shake to flavor coffee  Teaching Method Utilized:  Visual Auditory Hands on  Barriers to learning/adherence to lifestyle change: none  Demonstrated degree of understanding via:  Teach Back   Monitoring/Evaluation:  Dietary intake, exercise, and body weight. Follow up in 1 months for 4 month post-op visit.

## 2015-02-04 ENCOUNTER — Encounter: Payer: 59 | Attending: Surgery | Admitting: Dietician

## 2015-02-04 DIAGNOSIS — Z6841 Body Mass Index (BMI) 40.0 and over, adult: Secondary | ICD-10-CM | POA: Insufficient documentation

## 2015-02-04 DIAGNOSIS — Z713 Dietary counseling and surveillance: Secondary | ICD-10-CM | POA: Diagnosis not present

## 2015-02-04 NOTE — Progress Notes (Signed)
  Follow-up visit:  4 Months Post-Operative RYGB Surgery  Medical Nutrition Therapy:  Appt start time: 840 end time:  900.  Primary concerns today: Post-operative Bariatric Surgery Nutrition Management. Returns with a 6 lb loss. Things are going well. Eating more protein and loves the Premier Protein. Not having as many cravings as before. Trying to avoid greasy foods since it doesn't sit well. Feels good.   Surgery date: 10/06/2014 Surgery type: RYGB Start weight at [facility]: 242 lbs on 05/06/14 Weight today: 197.0 lbs  Weight change: 6 lbs  Total weigh loss 45 lbs Goal weight: 150 lbs  TANITA  BODY COMP RESULTS  09/22/14 10/21/14 12/02/14 12/31/14 02/04/15   BMI (kg/m^2) 44.8 40.9 39.1 38.4 37.2   Fat Mass (lbs) 115.5 107.0 90.5 89.5 85.5   Fat Free Mass (lbs) 121.5 109.5 116.5 113.5 111.5   Total Body Water (lbs) 89 80.0 85.5 83.0 81.5    Preferred Learning Style:   No preference indicated   Learning Readiness:   Ready  24-hr recall: B (AM): Premier Protein (usually) or egg with fruit (6-30 g) Snk (AM):cucumber salad/vegetables  L (PM): [place], chicken, beans, cheese, spinach or 2 oz shrimp or salad with frozen protein vegetables (14 g) Snk (PM): vegetables D (PM): 3 oz fish, roast beef, chicken, shrimp, meatloaf (21 g) Snk (PM): SF Popsicle or 4 small pretzels   Fluid intake: 64 + oz water, Diet Green Tea 1 x week, Decaf coffee with splenda and flavored creamer 2 x week, G2 2 per week, 1 protein shake per day, milk  Estimated total protein intake: 41-65 g   Medications: see list Supplementation: taking  Using straws: yes when she forgot, usually  Drinking while eating: did a couple times because she forgot Hair loss: No Carbonated beverages: No, tried one and did not like it N/V/D/C: No, has felt constipated when she doesn't get in enough water Dumping syndrome: No  Recent physical activity:  Exercise every day - gym, zumba, treadmill, weights for about 45-60  minutes  Progress Towards Goal(s):  In progress.    Nutritional Diagnosis:  [place]-3.3 Overweight/obesity related to past poor dietary habits and physical inactivity as evidenced by patient w/ recent RYGB surgery following dietary guidelines for continued weight loss.    Intervention:  Nutrition education/diet reinforcment Goals:  Follow Phase 3B: High Protein + Non-Starchy Vegetables  Eat 3-6 small meals/snacks, every 3-5 hrs  Increase lean protein foods to meet 60g goal - have protein shake each day  Increase fluid intake to 64oz +  Avoid drinking 15 minutes before, during and 30 minutes after eating  Aim for >30 min of physical activity daily  Try Powerade Zero instead of G2  Take 2 multvitamins, 3 calcium citrate (500 mg each), and 1 Vitamin B12 (50 mcg)  Teaching Method Utilized:  Visual Auditory Hands on  Barriers to learning/adherence to lifestyle change: none  Demonstrated degree of understanding via:  Teach Back   Monitoring/Evaluation:  Dietary intake, exercise, and body weight. Follow up in 2 months for 6 month post-op visit.

## 2015-02-04 NOTE — Patient Instructions (Addendum)
Goals:  Follow Phase 3B: High Protein + Non-Starchy Vegetables  Eat 3-6 small meals/snacks, every 3-5 hrs  Increase lean protein foods to meet 60g goal - have protein shake each day  Increase fluid intake to 64oz +  Avoid drinking 15 minutes before, during and 30 minutes after eating  Aim for >30 min of physical activity daily  Try Powerade Zero instead of G2  Take 2 multvitamins, 3 calcium citrate (500 mg each), and 1 Vitamin B12 (50 mcg)

## 2015-04-06 ENCOUNTER — Ambulatory Visit: Payer: 59 | Admitting: Dietician

## 2015-05-12 ENCOUNTER — Encounter (HOSPITAL_COMMUNITY): Payer: Self-pay | Admitting: Neurology

## 2015-05-12 ENCOUNTER — Observation Stay (HOSPITAL_COMMUNITY): Payer: 59 | Admitting: Anesthesiology

## 2015-05-12 ENCOUNTER — Encounter (HOSPITAL_COMMUNITY): Admission: EM | Disposition: A | Discharge status: Home / Self Care | Payer: Self-pay | Source: Home / Self Care

## 2015-05-12 ENCOUNTER — Ambulatory Visit: Payer: 59 | Admitting: Dietician

## 2015-05-12 ENCOUNTER — Inpatient Hospital Stay (HOSPITAL_COMMUNITY)
Admission: EM | Admit: 2015-05-12 | Discharge: 2015-05-15 | DRG: 326 | Disposition: A | Discharge status: Home / Self Care | Payer: 59 | Attending: Surgery | Admitting: Surgery

## 2015-05-12 ENCOUNTER — Emergency Department (HOSPITAL_COMMUNITY): Payer: 59

## 2015-05-12 DIAGNOSIS — R1012 Left upper quadrant pain: Secondary | ICD-10-CM

## 2015-05-12 DIAGNOSIS — K631 Perforation of intestine (nontraumatic): Secondary | ICD-10-CM | POA: Diagnosis present

## 2015-05-12 DIAGNOSIS — K9589 Other complications of other bariatric procedure: Secondary | ICD-10-CM | POA: Diagnosis not present

## 2015-05-12 DIAGNOSIS — R198 Other specified symptoms and signs involving the digestive system and abdomen: Secondary | ICD-10-CM

## 2015-05-12 DIAGNOSIS — K668 Other specified disorders of peritoneum: Secondary | ICD-10-CM | POA: Diagnosis present

## 2015-05-12 HISTORY — PX: LAPAROSCOPY: SHX197

## 2015-05-12 HISTORY — DX: Headache, unspecified: R51.9

## 2015-05-12 HISTORY — DX: Headache: R51

## 2015-05-12 LAB — DIFFERENTIAL
Basophils Absolute: 0 10*3/uL (ref 0.0–0.1)
Basophils Relative: 0 % (ref 0–1)
Eosinophils Absolute: 0 10*3/uL (ref 0.0–0.7)
Eosinophils Relative: 0 % (ref 0–5)
Lymphocytes Relative: 14 % (ref 12–46)
Lymphs Abs: 1.3 10*3/uL (ref 0.7–4.0)
Monocytes Absolute: 0.4 10*3/uL (ref 0.1–1.0)
Monocytes Relative: 4 % (ref 3–12)
NEUTROS PCT: 82 % — AB (ref 43–77)
Neutro Abs: 8 10*3/uL — ABNORMAL HIGH (ref 1.7–7.7)

## 2015-05-12 LAB — PREGNANCY, URINE: PREG TEST UR: NEGATIVE

## 2015-05-12 LAB — CBC
HCT: 41.8 % (ref 36.0–46.0)
Hemoglobin: 14 g/dL (ref 12.0–15.0)
MCH: 31.4 pg (ref 26.0–34.0)
MCHC: 33.5 g/dL (ref 30.0–36.0)
MCV: 93.7 fL (ref 78.0–100.0)
Platelets: 331 10*3/uL (ref 150–400)
RBC: 4.46 MIL/uL (ref 3.87–5.11)
RDW: 13 % (ref 11.5–15.5)
WBC: 9.9 10*3/uL (ref 4.0–10.5)

## 2015-05-12 LAB — GLUCOSE, CAPILLARY
GLUCOSE-CAPILLARY: 204 mg/dL — AB (ref 65–99)
Glucose-Capillary: 285 mg/dL — ABNORMAL HIGH (ref 65–99)

## 2015-05-12 LAB — URINALYSIS, ROUTINE W REFLEX MICROSCOPIC
Bilirubin Urine: NEGATIVE
Glucose, UA: NEGATIVE mg/dL
Hgb urine dipstick: NEGATIVE
Ketones, ur: NEGATIVE mg/dL
Leukocytes, UA: NEGATIVE
Nitrite: NEGATIVE
Protein, ur: NEGATIVE mg/dL
Specific Gravity, Urine: 1.023 (ref 1.005–1.030)
Urobilinogen, UA: 1 mg/dL (ref 0.0–1.0)
pH: 6.5 (ref 5.0–8.0)

## 2015-05-12 LAB — LIPASE, BLOOD: Lipase: 26 U/L (ref 22–51)

## 2015-05-12 LAB — COMPREHENSIVE METABOLIC PANEL
ALT: 56 U/L — ABNORMAL HIGH (ref 14–54)
AST: 30 U/L (ref 15–41)
Albumin: 4.1 g/dL (ref 3.5–5.0)
Alkaline Phosphatase: 75 U/L (ref 38–126)
Anion gap: 12 (ref 5–15)
BUN: 12 mg/dL (ref 6–20)
CO2: 26 mmol/L (ref 22–32)
Calcium: 9.5 mg/dL (ref 8.9–10.3)
Chloride: 100 mmol/L — ABNORMAL LOW (ref 101–111)
Creatinine, Ser: 0.77 mg/dL (ref 0.44–1.00)
GFR calc Af Amer: >60 mL/min (ref >60)
GFR calc non Af Amer: >60 mL/min (ref >60)
Glucose, Bld: 173 mg/dL — ABNORMAL HIGH (ref 65–99)
Potassium: 3.5 mmol/L (ref 3.5–5.1)
Sodium: 138 mmol/L (ref 135–145)
Total Bilirubin: 0.7 mg/dL (ref 0.3–1.2)
Total Protein: 7.4 g/dL (ref 6.5–8.1)

## 2015-05-12 LAB — I-STAT TROPONIN, ED: Troponin i, poc: 0 ng/mL (ref 0.00–0.08)

## 2015-05-12 LAB — CBG MONITORING, ED: GLUCOSE-CAPILLARY: 186 mg/dL — AB (ref 65–99)

## 2015-05-12 SURGERY — LAPAROSCOPY, DIAGNOSTIC
Anesthesia: General | Site: Abdomen

## 2015-05-12 MED ORDER — LIDOCAINE HCL (CARDIAC) 20 MG/ML IV SOLN
INTRAVENOUS | Status: DC | PRN
  Administered 2015-05-12: 100 mg via INTRAVENOUS

## 2015-05-12 MED ORDER — NEOSTIGMINE METHYLSULFATE 10 MG/10ML IV SOLN
INTRAVENOUS | Status: DC | PRN
  Administered 2015-05-12: 5 mg via INTRAVENOUS

## 2015-05-12 MED ORDER — CIPROFLOXACIN IN D5W 400 MG/200ML IV SOLN
INTRAVENOUS | Status: DC | PRN
  Administered 2015-05-12: 400 mg via INTRAVENOUS

## 2015-05-12 MED ORDER — FENTANYL CITRATE (PF) 100 MCG/2ML IJ SOLN
INTRAMUSCULAR | Status: DC | PRN
  Administered 2015-05-12: 50 ug via INTRAVENOUS
  Administered 2015-05-12: 100 ug via INTRAVENOUS
  Administered 2015-05-12 (×3): 50 ug via INTRAVENOUS

## 2015-05-12 MED ORDER — INSULIN ASPART 100 UNIT/ML ~~LOC~~ SOLN
0.0000 [IU] | Freq: Three times a day (TID) | SUBCUTANEOUS | Status: DC
  Administered 2015-05-13 (×2): 3 [IU] via SUBCUTANEOUS
  Administered 2015-05-13: 5 [IU] via SUBCUTANEOUS
  Administered 2015-05-14: 3 [IU] via SUBCUTANEOUS
  Administered 2015-05-14: 5 [IU] via SUBCUTANEOUS
  Administered 2015-05-14 – 2015-05-15 (×2): 2 [IU] via SUBCUTANEOUS
  Administered 2015-05-15: 5 [IU] via SUBCUTANEOUS

## 2015-05-12 MED ORDER — ROCURONIUM BROMIDE 100 MG/10ML IV SOLN
INTRAVENOUS | Status: DC | PRN
  Administered 2015-05-12: 50 mg via INTRAVENOUS

## 2015-05-12 MED ORDER — INSULIN ASPART 100 UNIT/ML ~~LOC~~ SOLN
0.0000 [IU] | Freq: Every day | SUBCUTANEOUS | Status: DC
  Administered 2015-05-12: 3 [IU] via SUBCUTANEOUS

## 2015-05-12 MED ORDER — MEPERIDINE HCL 25 MG/ML IJ SOLN: 6.2500 mg | INTRAMUSCULAR | Status: DC | PRN

## 2015-05-12 MED ORDER — BUPIVACAINE-EPINEPHRINE (PF) 0.25% -1:200000 IJ SOLN
INTRAMUSCULAR | Status: AC
  Filled 2015-05-12: qty 30

## 2015-05-12 MED ORDER — PROPOFOL 10 MG/ML IV BOLUS
INTRAVENOUS | Status: AC
  Filled 2015-05-12: qty 20

## 2015-05-12 MED ORDER — CIPROFLOXACIN IN D5W 400 MG/200ML IV SOLN
400.0000 mg | Freq: Once | INTRAVENOUS | Status: AC
  Administered 2015-05-12: 400 mg via INTRAVENOUS
  Filled 2015-05-12: qty 200

## 2015-05-12 MED ORDER — LACTATED RINGERS IV SOLN
INTRAVENOUS | Status: DC
  Administered 2015-05-12 (×2): via INTRAVENOUS

## 2015-05-12 MED ORDER — FENTANYL CITRATE (PF) 250 MCG/5ML IJ SOLN
INTRAMUSCULAR | Status: AC
  Filled 2015-05-12: qty 5

## 2015-05-12 MED ORDER — ONDANSETRON HCL 4 MG/2ML IJ SOLN: 4.0000 mg | Freq: Four times a day (QID) | INTRAMUSCULAR | Status: DC | PRN

## 2015-05-12 MED ORDER — SUCCINYLCHOLINE CHLORIDE 20 MG/ML IJ SOLN
INTRAMUSCULAR | Status: DC | PRN
  Administered 2015-05-12: 200 mg via INTRAVENOUS

## 2015-05-12 MED ORDER — MIDAZOLAM HCL 5 MG/5ML IJ SOLN
INTRAMUSCULAR | Status: DC | PRN
  Administered 2015-05-12: 2 mg via INTRAVENOUS

## 2015-05-12 MED ORDER — DIPHENHYDRAMINE HCL 12.5 MG/5ML PO ELIX: 12.5000 mg | ORAL_SOLUTION | Freq: Four times a day (QID) | ORAL | Status: DC | PRN

## 2015-05-12 MED ORDER — GLYCOPYRROLATE 0.2 MG/ML IJ SOLN
INTRAMUSCULAR | Status: DC | PRN
  Administered 2015-05-12: .8 mg via INTRAVENOUS

## 2015-05-12 MED ORDER — LACTATED RINGERS IV SOLN
INTRAVENOUS | Status: DC | PRN
  Administered 2015-05-12: 16:00:00 via INTRAVENOUS

## 2015-05-12 MED ORDER — GI COCKTAIL ~~LOC~~
30.0000 mL | Freq: Once | ORAL | Status: AC
  Administered 2015-05-12: 30 mL via ORAL
  Filled 2015-05-12: qty 30

## 2015-05-12 MED ORDER — ONDANSETRON HCL 4 MG/2ML IJ SOLN
INTRAMUSCULAR | Status: DC | PRN
  Administered 2015-05-12: 4 mg via INTRAVENOUS

## 2015-05-12 MED ORDER — HYDROMORPHONE HCL 1 MG/ML IJ SOLN: 1.0000 mg | Freq: Once | INTRAMUSCULAR | Status: DC

## 2015-05-12 MED ORDER — HYDROMORPHONE HCL 1 MG/ML IJ SOLN: 0.2500 mg | INTRAMUSCULAR | Status: DC | PRN

## 2015-05-12 MED ORDER — SODIUM CHLORIDE 0.9 % IV BOLUS (SEPSIS)
1000.0000 mL | Freq: Once | INTRAVENOUS | Status: AC
  Administered 2015-05-12: 1000 mL via INTRAVENOUS

## 2015-05-12 MED ORDER — 0.9 % SODIUM CHLORIDE (POUR BTL) OPTIME
TOPICAL | Status: DC | PRN
  Administered 2015-05-12: 2000 mL

## 2015-05-12 MED ORDER — DIPHENHYDRAMINE HCL 50 MG/ML IJ SOLN: 12.5000 mg | Freq: Four times a day (QID) | INTRAMUSCULAR | Status: DC | PRN

## 2015-05-12 MED ORDER — SODIUM CHLORIDE 0.9 % IR SOLN
Status: DC | PRN
  Administered 2015-05-12: 1000 mL

## 2015-05-12 MED ORDER — ACETAMINOPHEN 650 MG RE SUPP: 650.0000 mg | Freq: Four times a day (QID) | RECTAL | Status: DC | PRN

## 2015-05-12 MED ORDER — PIPERACILLIN-TAZOBACTAM 3.375 G IVPB
3.3750 g | Freq: Three times a day (TID) | INTRAVENOUS | Status: DC
  Administered 2015-05-12 – 2015-05-15 (×9): 3.375 g via INTRAVENOUS
  Filled 2015-05-12 (×12): qty 50

## 2015-05-12 MED ORDER — POTASSIUM CHLORIDE IN NACL 20-0.9 MEQ/L-% IV SOLN
INTRAVENOUS | Status: DC
  Filled 2015-05-12 (×2): qty 1000

## 2015-05-12 MED ORDER — METRONIDAZOLE IN NACL 5-0.79 MG/ML-% IV SOLN
500.0000 mg | Freq: Once | INTRAVENOUS | Status: AC
  Administered 2015-05-12: 500 mg via INTRAVENOUS
  Filled 2015-05-12: qty 100

## 2015-05-12 MED ORDER — PHENYLEPHRINE HCL 10 MG/ML IJ SOLN
INTRAMUSCULAR | Status: DC | PRN
  Administered 2015-05-12 (×2): 80 ug via INTRAVENOUS

## 2015-05-12 MED ORDER — PANTOPRAZOLE SODIUM 40 MG IV SOLR
40.0000 mg | Freq: Two times a day (BID) | INTRAVENOUS | Status: DC
  Administered 2015-05-12 – 2015-05-15 (×6): 40 mg via INTRAVENOUS
  Filled 2015-05-12 (×6): qty 40

## 2015-05-12 MED ORDER — PHENYLEPHRINE 40 MCG/ML (10ML) SYRINGE FOR IV PUSH (FOR BLOOD PRESSURE SUPPORT)
PREFILLED_SYRINGE | INTRAVENOUS | Status: AC
  Filled 2015-05-12: qty 10

## 2015-05-12 MED ORDER — BUPIVACAINE-EPINEPHRINE 0.25% -1:200000 IJ SOLN
INTRAMUSCULAR | Status: DC | PRN
Start: 2015-05-12 — End: 2015-05-12
  Administered 2015-05-12: 20 mL

## 2015-05-12 MED ORDER — MIDAZOLAM HCL 2 MG/2ML IJ SOLN
INTRAMUSCULAR | Status: AC
  Filled 2015-05-12: qty 2

## 2015-05-12 MED ORDER — PROPOFOL 10 MG/ML IV BOLUS
INTRAVENOUS | Status: DC | PRN
  Administered 2015-05-12: 160 mg via INTRAVENOUS

## 2015-05-12 MED ORDER — KCL IN DEXTROSE-NACL 20-5-0.45 MEQ/L-%-% IV SOLN
INTRAVENOUS | Status: DC
  Administered 2015-05-12 – 2015-05-14 (×6): via INTRAVENOUS
  Filled 2015-05-12 (×7): qty 1000

## 2015-05-12 MED ORDER — SUCCINYLCHOLINE CHLORIDE 20 MG/ML IJ SOLN
INTRAMUSCULAR | Status: AC
  Filled 2015-05-12: qty 1

## 2015-05-12 MED ORDER — METOPROLOL SUCCINATE ER 25 MG PO TB24
25.0000 mg | ORAL_TABLET | Freq: Once | ORAL | Status: AC
  Administered 2015-05-12: 25 mg via ORAL
  Filled 2015-05-12: qty 1

## 2015-05-12 MED ORDER — ACETAMINOPHEN 325 MG PO TABS: 650.0000 mg | ORAL_TABLET | Freq: Four times a day (QID) | ORAL | Status: DC | PRN

## 2015-05-12 MED ORDER — HYDROMORPHONE HCL 1 MG/ML IJ SOLN
1.0000 mg | Freq: Once | INTRAMUSCULAR | Status: AC
  Administered 2015-05-12: 1 mg via INTRAVENOUS
  Filled 2015-05-12: qty 1

## 2015-05-12 MED ORDER — METOPROLOL TARTRATE 1 MG/ML IV SOLN: 5.0000 mg | Freq: Four times a day (QID) | INTRAVENOUS | Status: DC | PRN

## 2015-05-12 MED ORDER — KCL IN DEXTROSE-NACL 20-5-0.45 MEQ/L-%-% IV SOLN
INTRAVENOUS | Status: AC
  Filled 2015-05-12: qty 1000

## 2015-05-12 MED ORDER — HYDROMORPHONE HCL 1 MG/ML IJ SOLN
0.5000 mg | INTRAMUSCULAR | Status: DC | PRN
  Administered 2015-05-12: 0.5 mg via INTRAVENOUS
  Administered 2015-05-13 – 2015-05-14 (×7): 1 mg via INTRAVENOUS
  Filled 2015-05-12 (×7): qty 1

## 2015-05-12 MED ORDER — PROMETHAZINE HCL 25 MG/ML IJ SOLN: 6.2500 mg | INTRAMUSCULAR | Status: DC | PRN

## 2015-05-12 SURGICAL SUPPLY — 44 items
APPLIER CLIP 5 13 M/L LIGAMAX5 (MISCELLANEOUS)
BLADE SURG ROTATE 9660 (MISCELLANEOUS) IMPLANT
CANISTER SUCTION 2500CC (MISCELLANEOUS) ×3 IMPLANT
CHLORAPREP W/TINT 26ML (MISCELLANEOUS) ×6 IMPLANT
CLIP APPLIE 5 13 M/L LIGAMAX5 (MISCELLANEOUS) IMPLANT
COVER SURGICAL LIGHT HANDLE (MISCELLANEOUS) ×3 IMPLANT
DECANTER SPIKE VIAL GLASS SM (MISCELLANEOUS) ×6 IMPLANT
DRAIN CHANNEL 19F RND (DRAIN) ×3 IMPLANT
DRAPE LAPAROSCOPIC ABDOMINAL (DRAPES) ×3 IMPLANT
ELECT REM PT RETURN 9FT ADLT (ELECTROSURGICAL) ×3
ELECTRODE REM PT RTRN 9FT ADLT (ELECTROSURGICAL) ×1 IMPLANT
EVACUATOR SILICONE 100CC (DRAIN) ×3 IMPLANT
GLOVE BIO SURGEON STRL SZ 6.5 (GLOVE) ×8 IMPLANT
GLOVE BIO SURGEONS STRL SZ 6.5 (GLOVE) ×4
GLOVE BIOGEL PI IND STRL 7.0 (GLOVE) ×6 IMPLANT
GLOVE BIOGEL PI INDICATOR 7.0 (GLOVE) ×12
GLOVE SURG SIGNA 7.5 PF LTX (GLOVE) ×3 IMPLANT
GLOVE SURG SS PI 7.0 STRL IVOR (GLOVE) ×12 IMPLANT
GOWN STRL REUS W/ TWL LRG LVL3 (GOWN DISPOSABLE) ×2 IMPLANT
GOWN STRL REUS W/TWL LRG LVL3 (GOWN DISPOSABLE) ×7 IMPLANT
KIT BASIN OR (CUSTOM PROCEDURE TRAY) ×3 IMPLANT
KIT ROOM TURNOVER OR (KITS) ×3 IMPLANT
LIQUID BAND (GAUZE/BANDAGES/DRESSINGS) ×3 IMPLANT
NS IRRIG 1000ML POUR BTL (IV SOLUTION) ×3 IMPLANT
PAD ARMBOARD 7.5X6 YLW CONV (MISCELLANEOUS) ×6 IMPLANT
SCISSORS LAP 5X35 DISP (ENDOMECHANICALS) IMPLANT
SET IRRIG TUBING LAPAROSCOPIC (IRRIGATION / IRRIGATOR) ×3 IMPLANT
SLEEVE ENDOPATH XCEL 5M (ENDOMECHANICALS) ×6 IMPLANT
STAPLER VISISTAT 35W (STAPLE) ×3 IMPLANT
SUT ETHILON 2 0 FS 18 (SUTURE) ×3 IMPLANT
SUT MNCRL AB 4-0 PS2 18 (SUTURE) ×9 IMPLANT
SUT SILK 2 0 SH (SUTURE) ×12 IMPLANT
SUT SILK 2 0 TIES 10X30 (SUTURE) ×6 IMPLANT
SUT SILK 3 0 SH CR/8 (SUTURE) ×6 IMPLANT
SUT SILK 3 0 TIES 10X30 (SUTURE) ×6 IMPLANT
TOWEL OR 17X24 6PK STRL BLUE (TOWEL DISPOSABLE) ×3 IMPLANT
TOWEL OR 17X26 10 PK STRL BLUE (TOWEL DISPOSABLE) ×3 IMPLANT
TRAY LAPAROSCOPIC MC (CUSTOM PROCEDURE TRAY) ×3 IMPLANT
TROCAR BLADELESS 11MM (ENDOMECHANICALS) ×3 IMPLANT
TROCAR XCEL BLUNT TIP 100MML (ENDOMECHANICALS) IMPLANT
TROCAR XCEL NON-BLD 11X100MML (ENDOMECHANICALS) ×3 IMPLANT
TROCAR XCEL NON-BLD 5MMX100MML (ENDOMECHANICALS) ×3 IMPLANT
TUBING INSUFFLATION (TUBING) ×3 IMPLANT
WATER STERILE IRR 1000ML POUR (IV SOLUTION) IMPLANT

## 2015-05-12 NOTE — Anesthesia Preprocedure Evaluation (Addendum)
Anesthesia Evaluation  Patient identified by MRN, date of birth, ID band Patient awake    Reviewed: Allergy & Precautions, H&P , NPO status , Patient's Chart, lab work & pertinent test results, reviewed documented beta blocker date and time   Airway Mallampati: III  TM Distance: >3 FB Neck ROM: Full    Dental no notable dental hx.    Pulmonary neg pulmonary ROS,  breath sounds clear to auscultation  Pulmonary exam normal       Cardiovascular hypertension, Pt. on medications and Pt. on home beta blockers negative cardio ROS Normal cardiovascular exam+ Valvular Problems/Murmurs Rhythm:Regular Rate:Normal     Neuro/Psych negative neurological ROS  negative psych ROS   GI/Hepatic negative GI ROS, Neg liver ROS,   Endo/Other  diabetes, Type 2, Oral Hypoglycemic AgentsMorbid obesity  Renal/GU negative Renal ROS     Musculoskeletal negative musculoskeletal ROS (+)   Abdominal (+) + obese,   Peds  Hematology  (+) anemia ,   Anesthesia Other Findings   Reproductive/Obstetrics negative OB ROS                            Anesthesia Physical  Anesthesia Plan  ASA: III  Anesthesia Plan: General   Post-op Pain Management:    Induction: Intravenous  Airway Management Planned: Oral ETT  Additional Equipment:   Intra-op Plan:   Post-operative Plan: Extubation in OR  Informed Consent: I have reviewed the patients History and Physical, chart, labs and discussed the procedure including the risks, benefits and alternatives for the proposed anesthesia with the patient or authorized representative who has indicated his/her understanding and acceptance.   Dental advisory given  Plan Discussed with: CRNA  Anesthesia Plan Comments: (2 x PIV and +/- aline)       Anesthesia Quick Evaluation

## 2015-05-12 NOTE — ED Provider Notes (Signed)
Medical screening examination/treatment/procedure(s) were conducted as a shared visit with non-physician practitioner(s) and myself.  I personally evaluated the patient during the encounter.   EKG Interpretation   Date/Time:  Tuesday May 12 2015 08:52:40 EDT Ventricular Rate:  90 PR Interval:  158 QRS Duration: 88 QT Interval:  370 QTC Calculation: 452 R Axis:   -21 Text Interpretation:  Normal sinus rhythm Left atrial enlargement  Non-specific ST-t changes Confirmed by Wilson Singer  MD, Donise Woodle ([id number]) on  05/12/2015 10:01:36 AM     49 year old female with abdominal pain. 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  Fairly acute onset of symptoms around 3 AM this morning. Persistent since then. Pain is diffuse, slightly worse on the left side. She is diffusely tender on my exam. Plain films concerning for perforated viscus. There is free air noted under the right diaphragm. Nothing by mouth. Antibiotics. Emergent surgical consultation.  CRITICAL CARE Performed by: Virgel Manifold Total critical care time: 35 minutes Critical care time was exclusive of separately billable procedures and treating other patients. Critical care was necessary to treat or prevent imminent or life-threatening deterioration. Critical care was time spent personally by me on the following activities: development of treatment plan with patient and/or surrogate as well as nursing, discussions with consultants, evaluation of patient's response to treatment, examination of patient, obtaining history from patient or surrogate, ordering and performing treatments and interventions, ordering and review of laboratory studies, ordering and review of radiographic studies, pulse oximetry and re-evaluation of patient's condition.   Virgel Manifold, MD 05/12/15 [phone]

## 2015-05-12 NOTE — Progress Notes (Signed)
Husband at bedside.  She had an uneventful RYGB in 10/06/2014. Her pre op weight was about 240.  She thinks that she has lost about 50 pounds.  She has taken some occasional ibuprofen.  She took some yesterday.  But is not chronically on NSAIDs.  She does not smoke and is not on steroids.  She awoke this AM about 3:30 with severe abdominal pain.  KUB shows pneumoperitoneum.  She has a probable perforated ulcer.  Will plan laparoscopic exploration with oversew of ulcer (if that is what is found).  I also discussed possible open surgery and possible other bowel injury (i.e., colon) that could require ostomy, etc.  To OR this afternoon.   Alphonsa Overall, MD, [facility] Pager: [phone] Office phone:  [phone]

## 2015-05-12 NOTE — Op Note (Addendum)
05/12/2015  5:26 PM  PATIENT:  [facility], 49 y.o., female, MRN: [id number]  PREOP DIAGNOSIS:  perforated viscous  POSTOP DIAGNOSIS:   Anterior perforation at the gastrojejunal anastomosis  PROCEDURE:   Procedure(s):  Laparoscopic exploration,  LAPAROSCOPY REPAIR OF PERFORATED BOWEL (omental patch) [photos in chart - they were supposed to get loaded into Epic, but I cannot find them]  SURGEON:   Alphonsa Overall, M.D.  ASSISTANT:   None  ANESTHESIA:   general  Anesthesiologist: Roberts Gaudy, MD; Nolon Nations, MD CRNA: Vennie Homans, CRNA; Laretta Alstrom, CRNA  General  EBL:  minimal  ml  BLOOD ADMINISTERED: none  DRAINS: 19 French Blake drain  LOCAL MEDICATIONS USED:   25 cc of 1/4% marcaine  SPECIMEN:   None  COUNTS CORRECT:  YES  INDICATIONS FOR PROCEDURE:  [facility] is a 50 y.o. (DOB: [birth year]) AA  female whose primary care physician is MCNEILL,WENDY, MD and comes for pneumoperitoneum.   She had an RYGB on 10/06/2014 by Dr. Rockne Coons.  She developed sudden abdominal pain this AM and has pneumoperitoneum on CXR.    The indications and risks of the surgery were explained to the patient.  The risks include, but are not limited to, infection, bleeding, and nerve injury.  PROCEDURE The patient was taken to room #2 at [facility] operating room.  She was given antibiotics preoperatively, her abdomen was prepped with ChloraPrep, and she was sterilely draped.  A timeout was held and surgical checklist run.  I accessed her abdominal cavity with a 5 mm Optiview trocar in the left upper quadrant. I later expanded this to an 11 mm trocar to get my needles into the abdomen. I placed 5 additional trochars: A 5 mm subcutaneous xiphoid trocar for the liver retractor, right upper quadrant 5 mm trocar, a left paramedian 5 mm trocar, a left 5 mm trocar for the camera, and a left lateral 5 mm trocar.  The abdomen was explored, both lobes of the liver looked okay, the gallbladder was  okay, the bile that I could see was okay. She had purulence in the upper abdomen under the lateral left lobe of the liver and over the right lobe of liver.  I dissected the left lobe of the liver off the anterior surface of the gastrojejunostomy. The gastrojejunostomy was ante colic and ante gastric.  I found an approximate 5 mm hole (perforation) at the gastrojejunostomy.  I closed the perforation with 2 interrupted 2-0 silk sutures and then brought omentum over the repair. I tacked the omentum with 2-0 silk sutures.  I irrigated the abdomen with 4 liters of saline. I saw no other intra-abdominal injury. She did have some adhesions of her small bowel towards her lower abdomen. I placed a 57 [place] Blake drain under the left lobe of the liver and over the repaired perforation and brought it out through the central upper 5 mm port.  I removed the trochars under direct visualization. I sewed the drain in with a 2-0 nylon suture. I closed the skin site at the other trochars with 5-0 Monocryl and pain in the skin with LiquidBand.   The patient tolerated the procedure well and was transferred to the recovery room in good condition.  Alphonsa Overall, MD, [facility] Pager: [phone] Office phone:  [phone]

## 2015-05-12 NOTE — H&P (Signed)
[facility] Admission Note  Kara [facility] January 10, 1966  [id number].    Requesting MD: Dr. Norma [facility] Chief Complaint/Reason for Consult: Free intra-abdominal air  HPI:  49 y/o AA female PMH of fibroids, HTN, DM, HLD, previous surgical history of gastric Roux-en-Y procedure 10/06/14 by [facility] who presents the [facility] complaining of acute onset of upper abdominal pain. Patient reports she woke up at 0330 hrs. with severe upper abdominal pain. She thought she may have to have a BM, but could not have one.  She reports the pain has been constant, however waxing and waning. Pain was worse in the car going over bumps, no alleviating factors.  She describes a burning sensation in her epigastric and left upper quadrant which radiated to her LLQ. Patient was celebrating her son's birthday yesterday and had one beer, no caffeine, took a total of 6 motrin in the last 2-3 weeks.  Recently got back from car trip from [place].  No sick contacts.  Patient denies nausea, vomiting, diarrhea, dysuria, fever/chills, chest pain, shortness of breath.  Normal flatus and BM's, normal appetite.  Patient states she does describe a discomfort in her left shoulder with deep breathing.  Patient has lost almost 60lbs since gastric bypass in December and her medical conditions are improving.  She was pending a follow up with [facility] in 2 weeks.  She says postoperatively everything has gone well.  H/o 3 c-sections, no other abdominal surgery.  Free air found on KUB.  She is afebrile without leukocytosis.      ROS: All systems reviewed and otherwise negative except for as above  Family History  Problem Relation Age of Onset  . Diabetes Mother   . Hypertension Mother   . Stroke Mother   . Diabetes Father   . Hypertension Father     Past Medical History  Diagnosis Date  . Fibroids   . Heart murmur   . Hypertension   . Hyperlipidemia   . Diabetes mellitus without complication   . Anemia     anemia prior to  uterine emboliztion procedure.    Past Surgical History  Procedure Laterality Date  . Uterine artery embolization  08-16-2010  . Breath tek h pylori N/A 04/17/2014    Procedure: BREATH TEK H PYLORI;  Surgeon: Kara Medal, MD;  Location: [facility];  Service: General;  Laterality: N/A;  . Cesarean section      x3- normal development  . Gastric roux-en-y N/A 10/06/2014    Procedure: LAPAROSCOPIC ROUX-EN-Y GASTRIC BYPASS WITH UPPER ENDOSCOPY;  Surgeon: Kara Earls, MD;  Location: [facility];  Service: General;  Laterality: N/A;    Social History:  reports that she has never smoked. She does not have any smokeless tobacco history on file. She reports that she drinks alcohol. She reports that she does not use illicit drugs.  Allergies: No Known Allergies   (Not in a hospital admission)  Blood pressure 154/77, pulse 63, temperature 98.1 F (36.7 C), temperature source Oral, resp. rate 27, last menstrual period 04/28/2015, SpO2 95 %. Physical Exam: General: pleasant, WD/WN AA female who is laying in bed in mild distress due to pain. HEENT: head is normocephalic, atraumatic.  Sclera are noninjected.  PERRL.  Ears and nose without any masses or lesions.  Mouth is pink and moist Heart: regular, rate, and rhythm.  No obvious murmurs, gallops, or rubs noted.  Palpable pedal pulses bilaterally Lungs: CTAB, no wheezes, rhonchi, or rales noted.  Respiratory effort nonlabored Abd: soft,  distended, quite tender in her epigastrium and LUQ, +BS, no masses or organomegaly, umbilical hernia soft and reducible, well healed laparoscopic scars noted MS: all 4 extremities are symmetrical with no cyanosis, clubbing, or edema. Skin: warm and dry with no masses, lesions, or rashes Psych: A&Ox3 with an appropriate affect.   Results for orders placed or performed during the hospital encounter of 05/12/15 (from the past 48 hour(s))  Lipase, blood     Status: None   Collection Time: 05/12/15  9:14 AM   Result Value Ref Range   Lipase 26 22 - 51 U/L  Comprehensive metabolic panel     Status: Abnormal   Collection Time: 05/12/15  9:14 AM  Result Value Ref Range   Sodium 138 135 - 145 mmol/L   Potassium 3.5 3.5 - 5.1 mmol/L   Chloride 100 (L) 101 - 111 mmol/L   CO2 26 22 - 32 mmol/L   Glucose, Bld 173 (H) 65 - 99 mg/dL   BUN 12 6 - 20 mg/dL   Creatinine, Ser 0.77 0.44 - 1.00 mg/dL   Calcium 9.5 8.9 - 10.3 mg/dL   Total Protein 7.4 6.5 - 8.1 g/dL   Albumin 4.1 3.5 - 5.0 g/dL   AST 30 15 - 41 U/L   ALT 56 (H) 14 - 54 U/L   Alkaline Phosphatase 75 38 - 126 U/L   Total Bilirubin 0.7 0.3 - 1.2 mg/dL   GFR calc non Af Amer >60 >60 mL/min   GFR calc Af Amer >60 >60 mL/min    Comment: (NOTE) The eGFR has been calculated using the CKD EPI equation. This calculation has not been validated in all clinical situations. eGFR's persistently <60 mL/min signify possible Chronic Kidney Disease.    Anion gap 12 5 - 15  CBC     Status: None   Collection Time: 05/12/15  9:14 AM  Result Value Ref Range   WBC 9.9 4.0 - 10.5 K/uL   RBC 4.46 3.87 - 5.11 MIL/uL   Hemoglobin 14.0 12.0 - 15.0 g/dL   HCT 41.8 36.0 - 46.0 %   MCV 93.7 78.0 - 100.0 fL   MCH 31.4 26.0 - 34.0 pg   MCHC 33.5 30.0 - 36.0 g/dL   RDW 13.0 11.5 - 15.5 %   Platelets 331 150 - 400 K/uL  Differential     Status: Abnormal   Collection Time: 05/12/15  9:14 AM  Result Value Ref Range   Neutrophils Relative % 82 (H) 43 - 77 %   Neutro Abs 8.0 (H) 1.7 - 7.7 K/uL   Lymphocytes Relative 14 12 - 46 %   Lymphs Abs 1.3 0.7 - 4.0 K/uL   Monocytes Relative 4 3 - 12 %   Monocytes Absolute 0.4 0.1 - 1.0 K/uL   Eosinophils Relative 0 0 - 5 %   Eosinophils Absolute 0.0 0.0 - 0.7 K/uL   Basophils Relative 0 0 - 1 %   Basophils Absolute 0.0 0.0 - 0.1 K/uL  I-Stat Troponin, ED (not at [facility])     Status: None   Collection Time: 05/12/15  9:19 AM  Result Value Ref Range   Troponin i, poc 0.00 0.00 - 0.08 ng/mL   Comment 3             Comment: Due to the release kinetics of cTnI, a negative result within the first hours of the onset of symptoms does not rule out myocardial infarction with certainty. If myocardial infarction is still suspected, repeat  the test at appropriate intervals.   Urinalysis, Routine w reflex microscopic (not at [facility])     Status: Abnormal   Collection Time: 05/12/15 10:39 AM  Result Value Ref Range   Color, Urine YELLOW YELLOW   APPearance HAZY (A) CLEAR   Specific Gravity, Urine 1.023 1.005 - 1.030   pH 6.5 5.0 - 8.0   Glucose, UA NEGATIVE NEGATIVE mg/dL   Hgb urine dipstick NEGATIVE NEGATIVE   Bilirubin Urine NEGATIVE NEGATIVE   Ketones, ur NEGATIVE NEGATIVE mg/dL   Protein, ur NEGATIVE NEGATIVE mg/dL   Urobilinogen, UA 1.0 0.0 - 1.0 mg/dL   Nitrite NEGATIVE NEGATIVE   Leukocytes, UA NEGATIVE NEGATIVE    Comment: MICROSCOPIC NOT [facility] ON URINES WITH NEGATIVE PROTEIN, BLOOD, LEUKOCYTES, NITRITE, OR GLUCOSE <1000 mg/dL.  Pregnancy, urine     Status: None   Collection Time: 05/12/15 10:39 AM  Result Value Ref Range   Preg Test, Ur NEGATIVE NEGATIVE    Comment:        THE SENSITIVITY OF THIS METHODOLOGY IS >20 mIU/mL.    Dg Abd Acute W/chest  05/12/2015   CLINICAL DATA:  Upper abdominal pain. Hypertension. Prior bariatric surgery.  EXAM: DG ABDOMEN ACUTE W/ 1V CHEST  COMPARISON:  Chest radiograph September 19, 2014 ; abdominal radiographs August 17, 2010  FINDINGS: PA chest: There is atelectasis and mild airspace consolidation in the left base. The lungs are otherwise clear. The heart is upper normal in size with pulmonary vascular within normal limits. No adenopathy.  Supine and upright abdomen: There is a focal area of pneumoperitoneum immediately inferior to the right hemidiaphragm. There is moderate stool throughout the colon. There is no bowel dilatation or air-fluid. Multiple calcifications are noted in the mid-pelvis.  IMPRESSION: Pneumoperitoneum. If patient has not had recent abdominal  surgery, perforated viscus must be of concern.  Calcifications in the pelvis. The most likely etiology for these calcifications is uterine leiomyomas, although urinary bladder calculi could present in this manner.  Left base atelectasis and mild infiltrate noted.  Critical Value/emergent results were called by telephone at the time of interpretation on 05/12/2015 at 10:22 am to [facility], [facility] , who verbally acknowledged these results.   Electronically Signed   By: Lowella Grip III M.D.   On: 05/12/2015 10:24      Assessment/Plan Free intra-abdominal air Acute abdominal pain Recent h/o roux-en-y bypass [facility] 09/2014 H/o 3 c-sections Umbilical hernia - tender but reducible and soft DM/HLD/HTN - SSI, metoprolol prn   Plan: 1.  Admit to [facility], urgent ex lap with repair of perforation, possible bowel resection 2.  NPO, IVF, pain control, antiemetics, antibiotics (Zosyn) 3.  Discussed likelihood of open exploratory surgery, possible diagnoses, anatomy, and possible procedures discussed including repair, resection, and possible ostomy.  The patient and her husband would like to proceed with surgical interventions    Nat Christen, [facility] 05/12/2015, 11:51 AM Pager: [phone]

## 2015-05-12 NOTE — Progress Notes (Signed)
   05/12/15 2034  Vitals  Temp 98.8 F (37.1 C)  Temp Source Oral  BP 137/79 mmHg  BP Location Right Arm  BP Method Automatic  Patient Position (if appropriate) Lying  Pulse Rate 92  Pulse Rate Source Dinamap  Resp (!) 21  Oxygen Therapy  SpO2 92 %  O2 Device Room Air  Height and Weight  Height 5\' 1"  (1.549 m)  Weight 95.573 kg (210 lb 11.2 oz)  BSA (Calculated - sq m) 2.03 sq meters  BMI (Calculated) 39.9  Weight in (lb) to have BMI = 25 132  Pt drowsy but oriented upon arrival.  Pt was settled into room and oriented to room.  Will continue to monitor pt.

## 2015-05-12 NOTE — Progress Notes (Signed)
Dr. Lucia Gaskins at bedside.  He stated to expect a lot of drainage from JP drain (irrigant).

## 2015-05-12 NOTE — ED Notes (Signed)
Pt yellow ring and yellow earrings given to husband. Pt clothing, underwear and shirt and shorts given to husband. Contacts given to husband.

## 2015-05-12 NOTE — Transfer of Care (Signed)
Immediate Anesthesia Transfer of Care Note  Patient: Kara Crawford  Procedure(s) Performed: Procedure(s): LAPAROSCOPY REPAIR OF PERFORATED BOWEL (N/A)  Patient Location: PACU  Anesthesia Type:General  Level of Consciousness: awake, alert , oriented and patient cooperative  Airway & Oxygen Therapy: Patient Spontanous Breathing and Patient connected to nasal cannula oxygen  Post-op Assessment: Report given to RN and Post -op Vital signs reviewed and stable  Post vital signs: Reviewed and stable  Last Vitals:  Filed Vitals:   05/12/15 1745  BP: 162/89  Pulse: 76  Temp: 36.8 C  Resp: 16    Complications: No apparent anesthesia complications

## 2015-05-12 NOTE — ED Notes (Signed)
Pt reports last night woke up at 0330 with abdominal pain, feeling like she needed to have BM. Reports gastric bypass surgery last year, received hepatitis shot yesterady. Now c/o epigastric pain and pain to left arm.

## 2015-05-12 NOTE — Anesthesia Postprocedure Evaluation (Signed)
  Anesthesia Post-op Note  Patient: Kara Crawford  Procedure(s) Performed: Procedure(s): LAPAROSCOPY REPAIR OF PERFORATED BOWEL (N/A)  Patient Location: PACU  Anesthesia Type:General  Level of Consciousness: awake, alert  and oriented  Airway and Oxygen Therapy: Patient Spontanous Breathing and Patient connected to nasal cannula oxygen  Post-op Pain: mild  Post-op Assessment: Post-op Vital signs reviewed, Patient's Cardiovascular Status Stable, Respiratory Function Stable, Patent Airway and Pain level controlled              Post-op Vital Signs: stable  Last Vitals:  Filed Vitals:   05/12/15 2034  BP: 137/79  Pulse: 92  Temp: 37.1 C  Resp: 21    Complications: No apparent anesthesia complications

## 2015-05-12 NOTE — ED Notes (Signed)
[id number] Report, bay 60.

## 2015-05-12 NOTE — ED Provider Notes (Signed)
CSN: [id number]     Arrival date & time 05/12/15  1660 History   First MD Initiated Contact with Patient 05/12/15 [phone]     Chief Complaint  Patient presents with  . 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 Patient reports she woke up at 0330 hrs. with upper abdominal pain. She reports the pain has been constant, however waxing and waning. She describes a burning sensation in her epigastric and left upper quadrant. Patient states yesterday she denies any heat anything unusual, however she did drink beer which is unusual for her. She reports taking one beer last night. Patient denies nausea, vomiting, diarrhea, dysuria, fever, chest pain, shortness of breath. Patient states she does describe a "discomfort in her left shoulder with deep inspiration and with lying flat.  Past Medical History  Diagnosis Date  . Fibroids   . Heart murmur   . Hypertension   . Hyperlipidemia   . Diabetes mellitus without complication   . Anemia     anemia prior to uterine emboliztion procedure.   Past Surgical History  Procedure Laterality Date  . Uterine artery embolization  08-16-2010  . Breath tek h pylori N/A 04/17/2014    Procedure: BREATH TEK H PYLORI;  Surgeon: Shann Medal, MD;  Location: [facility];  Service: General;  Laterality: N/A;  . Cesarean section      x3- normal development  . Gastric roux-en-y N/A 10/06/2014    Procedure: LAPAROSCOPIC ROUX-EN-Y GASTRIC BYPASS WITH UPPER ENDOSCOPY;  Surgeon: Pedro Earls, MD;  Location: [facility];  Service: General;  Laterality: N/A;   Family History  Problem Relation Age of Onset  . Diabetes Mother   . Hypertension Mother   . Stroke Mother   . Diabetes Father    . Hypertension Father    History  Substance Use Topics  . Smoking status: Never Smoker   . Smokeless tobacco: Not on file  . Alcohol Use: Yes     Comment: social -rare occ.   OB History    No data available     Review of Systems  Constitutional: Negative for fever.  HENT: Negative for trouble swallowing.   Eyes: Negative for visual disturbance.  Respiratory: Negative for shortness of breath.   Cardiovascular: Negative for chest pain.  Gastrointestinal: Positive for abdominal pain. Negative for nausea and vomiting.  Genitourinary: Negative for dysuria.  Musculoskeletal: Negative for neck pain.  Skin: Negative for rash.  Neurological: Negative for dizziness, weakness and numbness.  Psychiatric/Behavioral: Negative.       Allergies  Review of patient's allergies indicates no known allergies.  Home Medications   Prior to Admission medications   Medication Sig Start Date End Date Taking? Authorizing Provider  amLODipine (NORVASC) 10 MG tablet Take 10 mg by mouth every morning.  02/13/14  Yes Historical Provider, MD  aspirin 81 MG tablet Take 81 mg by mouth at bedtime.    Yes Historical Provider, MD  atorvastatin (LIPITOR) 80 MG tablet Take 160 mg by mouth every morning.   Yes Historical Provider, MD  Cyanocobalamin (VITAMIN B-12 PO) Take 1 tablet by mouth every morning.   Yes Historical Provider, MD  Ergocalciferol (VITAMIN D2 PO) Take 1 tablet by mouth every morning.   Yes Historical Provider, MD  ibuprofen (ADVIL,MOTRIN) 200 MG tablet Take 400  mg by mouth every 6 (six) hours as needed for headache or moderate pain.   Yes Historical Provider, MD  lisinopril-hydrochlorothiazide (PRINZIDE,ZESTORETIC) 20-12.5 MG per tablet Take 1 tablet by mouth every morning.  02/13/14  Yes Historical Provider, MD  metoprolol succinate (TOPROL-XL) 25 MG 24 hr tablet Take 25 mg by mouth every morning.  01/18/14  Yes Historical Provider, MD  Multiple Vitamin (MULTIVITAMIN WITH MINERALS) TABS tablet  Take 1 tablet by mouth every morning. Women's one-a-day   Yes Historical Provider, MD  sitaGLIPtin-metformin (JANUMET) 50-500 MG per tablet Take 1 tablet by mouth every morning.    Yes Historical Provider, MD   BP 113/61 mmHg  Pulse 86  Temp(Src) 98.1 F (36.7 C) (Oral)  Resp 25  SpO2 96%  LMP 04/28/2015 Physical Exam  Constitutional: She is oriented to person, place, and time. She appears well-developed and well-nourished.  Mild to moderate distress due to pain.  HENT:  Head: Normocephalic and atraumatic.  Mouth/Throat: Oropharynx is clear and moist. No oropharyngeal exudate.  Eyes: Right eye exhibits no discharge. Left eye exhibits no discharge. No scleral icterus.  Neck: Normal range of motion.  Cardiovascular: Normal rate, regular rhythm, S1 normal, S2 normal and normal heart sounds.   No murmur heard. Pulmonary/Chest: Effort normal and breath sounds normal. No respiratory distress.  Abdominal: Soft. Normal appearance. There is tenderness in the epigastric area and left upper quadrant. There is no rigidity, no guarding, no tenderness at McBurney's point and negative Murphy's sign.  Mild abdominal distention with diffuse tenderness, more significant in epigastrium and left upper quadrant.  Musculoskeletal: Normal range of motion. She exhibits no edema or tenderness.  Neurological: She is alert and oriented to person, place, and time. She has normal strength. No cranial nerve deficit or sensory deficit. Coordination normal. GCS eye subscore is 4. GCS verbal subscore is 5. GCS motor subscore is 6.  Patient fully alert, answering questions appropriately in full, clear sentences. Cranial nerves II through XII grossly intact. Motor strength 5 out of 5 in all major muscle groups of upper and lower extremities. Distal sensation intact.   Skin: Skin is warm and dry. No rash noted. She is not diaphoretic.  Psychiatric: She has a normal mood and affect.  Nursing note and vitals  reviewed.   ED Course  Procedures (including critical care time) Labs Review Labs Reviewed  COMPREHENSIVE METABOLIC PANEL - Abnormal; Notable for the following:    Chloride 100 (*)    Glucose, Bld 173 (*)    ALT 56 (*)    All other components within normal limits  URINALYSIS, ROUTINE W REFLEX MICROSCOPIC (NOT AT [facility]) - Abnormal; Notable for the following:    APPearance HAZY (*)    All other components within normal limits  DIFFERENTIAL - Abnormal; Notable for the following:    Neutrophils Relative % 82 (*)    Neutro Abs 8.0 (*)    All other components within normal limits  LIPASE, BLOOD  CBC  PREGNANCY, URINE  I-STAT TROPOININ, ED    Imaging Review Dg Abd Acute W/chest  05/12/2015   CLINICAL DATA:  Upper abdominal pain. Hypertension. Prior bariatric surgery.  EXAM: DG ABDOMEN ACUTE W/ 1V CHEST  COMPARISON:  Chest radiograph September 19, 2014 ; abdominal radiographs August 17, 2010  FINDINGS: PA chest: There is atelectasis and mild airspace consolidation in the left base. The lungs are otherwise clear. The heart is upper normal in size with pulmonary vascular within normal limits. No adenopathy.  Supine and  upright abdomen: There is a focal area of pneumoperitoneum immediately inferior to the right hemidiaphragm. There is moderate stool throughout the colon. There is no bowel dilatation or air-fluid. Multiple calcifications are noted in the mid-pelvis.  IMPRESSION: Pneumoperitoneum. If patient has not had recent abdominal surgery, perforated viscus must be of concern.  Calcifications in the pelvis. The most likely etiology for these calcifications is uterine leiomyomas, although urinary bladder calculi could present in this manner.  Left base atelectasis and mild infiltrate noted.  Critical Value/emergent results were called by telephone at the time of interpretation on 05/12/2015 at 10:22 am to [facility], PA , who verbally acknowledged these results.   Electronically Signed   By: Lowella Grip III M.D.   On: 05/12/2015 10:24     EKG Interpretation   Date/Time:  Tuesday May 12 2015 08:52:40 EDT Ventricular Rate:  90 PR Interval:  158 QRS Duration: 88 QT Interval:  370 QTC Calculation: 452 R Axis:   -21 Text Interpretation:  Normal sinus rhythm Left atrial enlargement  Non-specific ST-t changes Confirmed by Wilson Singer  MD, STEPHEN ([id number]) on  05/12/2015 10:01:37 AM      MDM   Final diagnoses:  LUQ abdominal pain    Abd pain since 0330, waxes/wanes.Radiates to L shoulder. LUQ/ epigastric tenderness, mild, diffuse distension.  NO N/V/D.    Labs unremarkable for acute pathology. Abdominal upright series with impression: Pneumoperitoneum. If patient has not had recent abdominal surgery, perforated viscus must be of concern.  Calcifications in the pelvis. The most likely etiology for these calcifications is uterine leiomyomas, although urinary bladder calculi could present in this manner.  Left base atelectasis and mild infiltrate noted.  Critical Value/emergent results were called by telephone at the time of interpretation on 05/12/2015 at 10:22 am to [facility], [facility] , who verbally acknowledged these results.  Spoke with general surgery who agrees to come see patient for further evaluation and treatment.  12:30 PM: General surgery to admit, patient to be taken to the OR for urgent ex-lap with repair of perforation, possible bowel resection.  The patient appears reasonably stabilized for admission considering the current resources, flow, and capabilities available in the ED at this time, and I doubt any other [facility] requiring further screening and/or treatment in the ED prior to admission.  BP 113/61 mmHg  Pulse 86  Temp(Src) 98.1 F (36.7 C) (Oral)  Resp 25  SpO2 96%  LMP 04/28/2015  Signed,  Dahlia Bailiff, PA-C 12:36 PM  Patient seen and discussed with Dr. Virgel Manifold, MD  Dahlia Bailiff, PA-C 05/12/15 [place], MD 05/13/15 [phone]

## 2015-05-13 ENCOUNTER — Encounter (HOSPITAL_COMMUNITY): Payer: Self-pay | Admitting: General Practice

## 2015-05-13 DIAGNOSIS — I10 Essential (primary) hypertension: Secondary | ICD-10-CM | POA: Diagnosis present

## 2015-05-13 DIAGNOSIS — K9589 Other complications of other bariatric procedure: Principal | ICD-10-CM | POA: Diagnosis present

## 2015-05-13 DIAGNOSIS — Z7982 Long term (current) use of aspirin: Secondary | ICD-10-CM | POA: Diagnosis not present

## 2015-05-13 DIAGNOSIS — E119 Type 2 diabetes mellitus without complications: Secondary | ICD-10-CM | POA: Diagnosis present

## 2015-05-13 DIAGNOSIS — K631 Perforation of intestine (nontraumatic): Secondary | ICD-10-CM | POA: Diagnosis present

## 2015-05-13 DIAGNOSIS — J9811 Atelectasis: Secondary | ICD-10-CM | POA: Diagnosis not present

## 2015-05-13 DIAGNOSIS — Z6839 Body mass index (BMI) 39.0-39.9, adult: Secondary | ICD-10-CM

## 2015-05-13 DIAGNOSIS — Y832 Surgical operation with anastomosis, bypass or graft as the cause of abnormal reaction of the patient, or of later complication, without mention of misadventure at the time of the procedure: Secondary | ICD-10-CM | POA: Diagnosis present

## 2015-05-13 DIAGNOSIS — D649 Anemia, unspecified: Secondary | ICD-10-CM | POA: Diagnosis present

## 2015-05-13 DIAGNOSIS — R1012 Left upper quadrant pain: Secondary | ICD-10-CM | POA: Diagnosis present

## 2015-05-13 DIAGNOSIS — E785 Hyperlipidemia, unspecified: Secondary | ICD-10-CM | POA: Diagnosis present

## 2015-05-13 DIAGNOSIS — K429 Umbilical hernia without obstruction or gangrene: Secondary | ICD-10-CM | POA: Diagnosis present

## 2015-05-13 HISTORY — PX: EXPLORATORY LAPAROTOMY: SUR591

## 2015-05-13 LAB — CBC
HCT: 39.1 % (ref 36.0–46.0)
HEMOGLOBIN: 12.8 g/dL (ref 12.0–15.0)
MCH: 31.1 pg (ref 26.0–34.0)
MCHC: 32.7 g/dL (ref 30.0–36.0)
MCV: 95.1 fL (ref 78.0–100.0)
PLATELETS: 255 10*3/uL (ref 150–400)
RBC: 4.11 MIL/uL (ref 3.87–5.11)
RDW: 13.4 % (ref 11.5–15.5)
WBC: 8 10*3/uL (ref 4.0–10.5)

## 2015-05-13 LAB — GLUCOSE, CAPILLARY
GLUCOSE-CAPILLARY: 175 mg/dL — AB (ref 65–99)
Glucose-Capillary: 228 mg/dL — ABNORMAL HIGH (ref 65–99)
Glucose-Capillary: 175 mg/dL — ABNORMAL HIGH (ref 65–99)
Glucose-Capillary: 194 mg/dL — ABNORMAL HIGH (ref 65–99)

## 2015-05-13 LAB — BASIC METABOLIC PANEL
Anion gap: 9 (ref 5–15)
BUN: 8 mg/dL (ref 6–20)
CHLORIDE: 100 mmol/L — AB (ref 101–111)
CO2: 27 mmol/L (ref 22–32)
CREATININE: 0.73 mg/dL (ref 0.44–1.00)
Calcium: 8.4 mg/dL — ABNORMAL LOW (ref 8.9–10.3)
GFR calc Af Amer: >60 mL/min (ref >60)
GFR calc non Af Amer: >60 mL/min (ref >60)
Glucose, Bld: 211 mg/dL — ABNORMAL HIGH (ref 65–99)
Potassium: 4.6 mmol/L (ref 3.5–5.1)
Sodium: 136 mmol/L (ref 135–145)

## 2015-05-13 NOTE — Progress Notes (Signed)
Patient ID: Kara Crawford, female   DOB: 1966-08-16, 49 y.o.   MRN: [id number]  HD#2 POD#1  Subjective: Feeling better this morning, pain controlled.   Objective: Vital signs in last 24 hours: Temp:  [98.2 F (36.8 C)-98.9 F (37.2 C)] 98.9 F (37.2 C) (07/13 0634) Pulse Rate:  [63-130] 83 (07/13 0634) Resp:  [15-29] 18 (07/13 0634) BP: (113-162)/(61-89) 141/86 mmHg (07/13 0634) SpO2:  [87 %-99 %] 91 % (07/13 0634) Weight:  [95.573 kg (210 lb 11.2 oz)] 95.573 kg (210 lb 11.2 oz) (07/12 2034) Last BM Date: 05/12/15   JP: 322ml/24h   Laboratory  CBC  Recent Labs  05/12/15 0914 05/13/15 0319  WBC 9.9 8.0  HGB 14.0 12.8  HCT 41.8 39.1  PLT 331 255   BMET  Recent Labs  05/12/15 0914 05/13/15 0319  NA 138 136  K 3.5 4.6  CL 100* 100*  CO2 26 27  GLUCOSE 173* 211*  BUN 12 8  CREATININE 0.77 0.73  CALCIUM 9.5 8.4*    Physical Exam General appearance: alert and no distress Resp: clear to auscultation bilaterally Cardio: regular rate and rhythm GI: Soft, appropriately TTP, +BS   Assessment/Plan: Gastric perf s/p laparoscopic exploration, repair w/omental patch -- Continue NPO, plan UGI later this week.    Lisette Abu, PA-C Pager: [phone] 05/13/2015

## 2015-05-14 ENCOUNTER — Inpatient Hospital Stay (HOSPITAL_COMMUNITY): Payer: 59

## 2015-05-14 LAB — GLUCOSE, CAPILLARY
Glucose-Capillary: 190 mg/dL — ABNORMAL HIGH (ref 65–99)
Glucose-Capillary: 149 mg/dL — ABNORMAL HIGH (ref 65–99)

## 2015-05-14 MED ORDER — IOHEXOL 300 MG/ML  SOLN
150.0000 mL | Freq: Once | INTRAMUSCULAR | Status: AC | PRN
  Administered 2015-05-14: 150 mL via ORAL

## 2015-05-14 MED ORDER — ENOXAPARIN SODIUM 40 MG/0.4ML ~~LOC~~ SOLN
40.0000 mg | SUBCUTANEOUS | Status: DC
  Administered 2015-05-14 – 2015-05-15 (×2): 40 mg via SUBCUTANEOUS
  Filled 2015-05-14 (×2): qty 0.4

## 2015-05-14 NOTE — Progress Notes (Signed)
[facility] Progress Note  2 Days Post-Op  Subjective: Pt feels great.  No N/V, tolerating ice chips.  Abdominal pain well controlled.  Having flatus, no BM yet.  Motivated to walk.  IS up to 1000.    Objective: Vital signs in last 24 hours: Temp:  [98.6 F (37 C)-99.3 F (37.4 C)] 98.6 F (37 C) (07/14 0506) Pulse Rate:  [74-88] 74 (07/14 0506) Resp:  [18] 18 (07/14 0506) BP: (129-151)/(76-88) 143/86 mmHg (07/14 0506) SpO2:  [92 %-95 %] 93 % (07/14 0506) Last BM Date: 05/12/15  Intake/Output from previous day: 07/13 0701 - 07/14 0700 In: 1250 [I.V.:1200; IV Piggyback:50] Out: 2980 [Urine:2750; Drains:230] Intake/Output this shift:    PE: Gen:  Alert, NAD, pleasant Abd: Soft, mild distension, mild tenderness, +BS, no HSM, incisions C/D/I, drain with minimal clear serous drainage (211mL/24hr)   Lab Results:   Recent Labs  05/12/15 0914 05/13/15 0319  WBC 9.9 8.0  HGB 14.0 12.8  HCT 41.8 39.1  PLT 331 255   BMET  Recent Labs  05/12/15 0914 05/13/15 0319  NA 138 136  K 3.5 4.6  CL 100* 100*  CO2 26 27  GLUCOSE 173* 211*  BUN 12 8  CREATININE 0.77 0.73  CALCIUM 9.5 8.4*   PT/INR No results for input(s): LABPROT, INR in the last 72 hours. CMP     Component Value Date/Time   NA 136 05/13/2015 0319   K 4.6 05/13/2015 0319   CL 100* 05/13/2015 0319   CO2 27 05/13/2015 0319   GLUCOSE 211* 05/13/2015 0319   BUN 8 05/13/2015 0319   CREATININE 0.73 05/13/2015 0319   CALCIUM 8.4* 05/13/2015 0319   PROT 7.4 05/12/2015 0914   ALBUMIN 4.1 05/12/2015 0914   AST 30 05/12/2015 0914   ALT 56* 05/12/2015 0914   ALKPHOS 75 05/12/2015 0914   BILITOT 0.7 05/12/2015 0914   GFRNONAA >60 05/13/2015 0319   GFRAA >60 05/13/2015 0319   Lipase     Component Value Date/Time   LIPASE 26 05/12/2015 0914       Studies/Results: Dg Abd Acute W/chest  05/12/2015   CLINICAL DATA:  Upper abdominal pain. Hypertension. Prior bariatric surgery.  EXAM: DG  ABDOMEN ACUTE W/ 1V CHEST  COMPARISON:  Chest radiograph September 19, 2014 ; abdominal radiographs August 17, 2010  FINDINGS: PA chest: There is atelectasis and mild airspace consolidation in the left base. The lungs are otherwise clear. The heart is upper normal in size with pulmonary vascular within normal limits. No adenopathy.  Supine and upright abdomen: There is a focal area of pneumoperitoneum immediately inferior to the right hemidiaphragm. There is moderate stool throughout the colon. There is no bowel dilatation or air-fluid. Multiple calcifications are noted in the mid-pelvis.  IMPRESSION: Pneumoperitoneum. If patient has not had recent abdominal surgery, perforated viscus must be of concern.  Calcifications in the pelvis. The most likely etiology for these calcifications is uterine leiomyomas, although urinary bladder calculi could present in this manner.  Left base atelectasis and mild infiltrate noted.  Critical Value/emergent results were called by telephone at the time of interpretation on 05/12/2015 at 10:22 am to [facility], PA , who verbally acknowledged these results.   Electronically Signed   By: Lowella Grip III M.D.   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

## 2015-05-15 LAB — BASIC METABOLIC PANEL
Anion gap: 8 (ref 5–15)
BUN: 5 mg/dL — ABNORMAL LOW (ref 6–20)
CO2: 28 mmol/L (ref 22–32)
Calcium: 9.3 mg/dL (ref 8.9–10.3)
Chloride: 102 mmol/L (ref 101–111)
Creatinine, Ser: 0.66 mg/dL (ref 0.44–1.00)
GLUCOSE: 205 mg/dL — AB (ref 65–99)
POTASSIUM: 3.6 mmol/L (ref 3.5–5.1)
SODIUM: 138 mmol/L (ref 135–145)

## 2015-05-15 LAB — CBC
HEMATOCRIT: 34.3 % — AB (ref 36.0–46.0)
HEMOGLOBIN: 11.4 g/dL — AB (ref 12.0–15.0)
MCH: 31.3 pg (ref 26.0–34.0)
MCHC: 33.2 g/dL (ref 30.0–36.0)
MCV: 94.2 fL (ref 78.0–100.0)
Platelets: 255 10*3/uL (ref 150–400)
RBC: 3.64 MIL/uL — AB (ref 3.87–5.11)
RDW: 12.8 % (ref 11.5–15.5)
WBC: 6.2 10*3/uL (ref 4.0–10.5)

## 2015-05-15 LAB — GLUCOSE, CAPILLARY
Glucose-Capillary: 220 mg/dL — ABNORMAL HIGH (ref 65–99)
Glucose-Capillary: 166 mg/dL — ABNORMAL HIGH (ref 65–99)
Glucose-Capillary: 205 mg/dL — ABNORMAL HIGH (ref 65–99)
Glucose-Capillary: 137 mg/dL — ABNORMAL HIGH (ref 65–99)

## 2015-05-15 MED ORDER — ATORVASTATIN CALCIUM 80 MG PO TABS
160.0000 mg | ORAL_TABLET | Freq: Every morning | ORAL | Status: DC
  Administered 2015-05-15: 160 mg via ORAL
  Filled 2015-05-15 (×2): qty 2

## 2015-05-15 MED ORDER — METOPROLOL SUCCINATE ER 25 MG PO TB24
25.0000 mg | ORAL_TABLET | Freq: Every morning | ORAL | Status: DC
  Administered 2015-05-15: 25 mg via ORAL
  Filled 2015-05-15: qty 1

## 2015-05-15 MED ORDER — LISINOPRIL 20 MG PO TABS
20.0000 mg | ORAL_TABLET | Freq: Every day | ORAL | Status: DC
  Administered 2015-05-15: 20 mg via ORAL
  Filled 2015-05-15: qty 1

## 2015-05-15 MED ORDER — OXYCODONE HCL 5 MG PO TABS: 5.0000 mg | ORAL_TABLET | ORAL | Status: DC | PRN

## 2015-05-15 MED ORDER — LINAGLIPTIN 5 MG PO TABS
5.0000 mg | ORAL_TABLET | Freq: Every day | ORAL | Status: DC
  Administered 2015-05-15: 5 mg via ORAL
  Filled 2015-05-15: qty 1

## 2015-05-15 MED ORDER — SITAGLIPTIN PHOS-METFORMIN HCL 50-500 MG PO TABS: 1.0000 | ORAL_TABLET | Freq: Every morning | ORAL | Status: DC

## 2015-05-15 MED ORDER — BISACODYL 10 MG RE SUPP
10.0000 mg | Freq: Once | RECTAL | Status: DC
  Filled 2015-05-15: qty 1

## 2015-05-15 MED ORDER — DOCUSATE SODIUM 100 MG PO CAPS
100.0000 mg | ORAL_CAPSULE | Freq: Two times a day (BID) | ORAL | Status: DC
  Administered 2015-05-15: 100 mg via ORAL
  Filled 2015-05-15: qty 1

## 2015-05-15 MED ORDER — AMOXICILLIN-POT CLAVULANATE 875-125 MG PO TABS: 1.0000 | ORAL_TABLET | Freq: Two times a day (BID) | ORAL | Status: DC

## 2015-05-15 MED ORDER — OXYCODONE HCL 5 MG PO TABS
5.0000 mg | ORAL_TABLET | ORAL | Status: DC | PRN
  Administered 2015-05-15: 10 mg via ORAL
  Filled 2015-05-15: qty 2

## 2015-05-15 MED ORDER — HYDROCODONE-ACETAMINOPHEN 5-325 MG PO TABS: 1.0000 | ORAL_TABLET | ORAL | Status: DC | PRN

## 2015-05-15 MED ORDER — OMEPRAZOLE 40 MG PO CPDR: 40.0000 mg | DELAYED_RELEASE_CAPSULE | Freq: Two times a day (BID) | ORAL | Status: DC

## 2015-05-15 MED ORDER — LISINOPRIL-HYDROCHLOROTHIAZIDE 20-12.5 MG PO TABS: 1.0000 | ORAL_TABLET | Freq: Every morning | ORAL | Status: DC

## 2015-05-15 MED ORDER — METFORMIN HCL 500 MG PO TABS
500.0000 mg | ORAL_TABLET | Freq: Two times a day (BID) | ORAL | Status: DC
  Administered 2015-05-15: 500 mg via ORAL
  Filled 2015-05-15: qty 1

## 2015-05-15 MED ORDER — HYDROCHLOROTHIAZIDE 12.5 MG PO CAPS
12.5000 mg | ORAL_CAPSULE | Freq: Every day | ORAL | Status: DC
  Administered 2015-05-15: 12.5 mg via ORAL
  Filled 2015-05-15: qty 1

## 2015-05-15 MED ORDER — AMLODIPINE BESYLATE 10 MG PO TABS
10.0000 mg | ORAL_TABLET | Freq: Every morning | ORAL | Status: DC
  Administered 2015-05-15: 10 mg via ORAL
  Filled 2015-05-15: qty 1

## 2015-05-15 MED ORDER — DOCUSATE SODIUM 100 MG PO CAPS: 100.0000 mg | ORAL_CAPSULE | Freq: Two times a day (BID) | ORAL | Status: DC | PRN

## 2015-05-15 NOTE — Discharge Instructions (Addendum)
LAPAROSCOPIC SURGERY: POST OP INSTRUCTIONS  1. DIET: Follow a light bland diet the first 24 hours after arrival home, such as soup, liquids, crackers, etc. Be sure to include lots of fluids daily. Avoid fast food or heavy meals as your are more likely to get nauseated. Eat a low fat the next few days after surgery.  2. Take your usually prescribed home medications unless otherwise directed. 3. PAIN CONTROL:  1. Pain is best controlled by a usual combination of three different methods TOGETHER:  1. Ice/Heat 2. Over the counter pain medication 3. Prescription pain medication 2. Most patients will experience some swelling and bruising around the incisions. Ice packs or heating pads (30-60 minutes up to 6 times a day) will help. Use ice for the first few days to help decrease swelling and bruising, then switch to heat to help relax tight/sore spots and speed recovery. Some people prefer to use ice alone, heat alone, alternating between ice & heat. Experiment to what works for you. Swelling and bruising can take several weeks to resolve.  3. It is helpful to take an over-the-counter pain medication regularly for the first few weeks. Choose one of the following that works best for you:  1. Naproxen (Aleve, etc) Two 220mg  tabs twice a day 2. Ibuprofen (Advil, etc) Three 200mg  tabs four times a day (every meal & bedtime) 3. Acetaminophen (Tylenol, etc) 500-650mg  four times a day (every meal & bedtime) 4. A prescription for pain medication (such as oxycodone, hydrocodone, etc) should be given to you upon discharge. Take your pain medication as prescribed.  1. If you are having problems/concerns with the prescription medicine (does not control pain, nausea, vomiting, rash, itching, etc), please call us [phone] to see if we need to switch you to a different pain medicine that will work better for you and/or control your side effect better. 2. If you need a refill on your pain medication, please  contact your pharmacy. They will contact our office to request authorization. Prescriptions will not be filled after 5 pm or on week-ends. 4. Avoid getting constipated. Between the surgery and the pain medications, it is common to experience some constipation. Increasing fluid intake and taking a fiber supplement (such as Metamucil, Citrucel, FiberCon, MiraLax, etc) 1-2 times a day regularly will usually help prevent this problem from occurring. A mild laxative (prune juice, Milk of Magnesia, MiraLax, etc) should be taken according to package directions if there are no bowel movements after 48 hours.  5. Watch out for diarrhea. If you have many loose bowel movements, simplify your diet to bland foods & liquids for a few days. Stop any stool softeners and decrease your fiber supplement. Switching to mild anti-diarrheal medications (Kayopectate, Pepto Bismol) can help. If this worsens or does not improve, please call us. 6. Wash / shower every day. You may shower over the dressings as they are waterproof. Continue to shower over incision(s) after the dressing is off. 7. Remove your waterproof bandages 5 days after surgery. You may leave the incision open to air. You may replace a dressing/Band-Aid to cover the incision for comfort if you wish.  8. ACTIVITIES as tolerated:  1. You may resume regular (light) daily activities beginning the next day--such as daily self-care, walking, climbing stairs--gradually increasing activities as tolerated. If you can walk 30 minutes without difficulty, it is safe to try more intense activity such as jogging, treadmill, bicycling, low-impact aerobics, swimming, etc. 2. Save the most intensive and strenuous activity  for last such as sit-ups, heavy lifting, contact sports, etc Refrain from any heavy lifting or straining until you are off narcotics for pain control.  3. DO NOT PUSH THROUGH PAIN. Let pain be your guide: If it hurts to do something, don't do it. Pain is your body  warning you to avoid that activity for another week until the pain goes down. 4. You may drive when you are no longer taking prescription pain medication, you can comfortably wear a seatbelt, and you can safely maneuver your car and apply brakes. 5. You may have sexual intercourse when it is comfortable.  9. FOLLOW UP in our office  1. Please call [facility] at [phone] to set up an appointment to see your surgeon in the office for a follow-up appointment approximately 2-3 weeks after your surgery. 2. Make sure that you call for this appointment the day you arrive home to insure a convenient appointment time.      10. IF YOU HAVE DISABILITY OR FAMILY LEAVE FORMS, BRING THEM TO THE               OFFICE FOR PROCESSING.   WHEN TO CALL us [phone]:  1. Poor pain control 2. Reactions / problems with new medications (rash/itching, nausea, etc)  3. Fever over 101.5 F (38.5 C) 4. Inability to urinate 5. Nausea and/or vomiting 6. Worsening swelling or bruising 7. Continued bleeding from incision. 8. Increased pain, redness, or drainage from the incision  The clinic staff is available to answer your questions during regular business hours (8:30am-5pm). Please dont hesitate to call and ask to speak to one of our nurses for clinical concerns.  If you have a medical emergency, go to the nearest emergency room or call 911.  A surgeon from [facility] is always on call at the [facility], [address], [place], [place], [place] [address] ?  MAIN: [phone] ? TOLL FREE: [phone] ?  FAX [phone]  [email or web address]   Bulb Drain Home Care A bulb drain consists of a thin rubber tube and a soft, round bulb that creates a gentle suction. The rubber tube is placed in the area where you had surgery. A bulb is attached to the end of the tube that is outside the body. The bulb drain removes excess fluid that normally builds up  in a surgical wound after surgery. The color and amount of fluid will vary. Immediately after surgery, the fluid is bright red and is a little thicker than water. It may gradually change to a yellow or pink color and become more thin and water-like. When the amount decreases to about 1 or 2 tbsp in 24 hours, your health care provider will usually remove it. DAILY CARE  Keep the bulb flat (compressed) at all times, except while emptying it. The flatness creates suction. You can flatten the bulb by squeezing it firmly in the middle and then closing the cap.  Keep sites where the tube enters the skin dry and covered with a bandage (dressing).  Secure the tube 1-2 in (2.5-5.1 cm) below the insertion sites to keep it from pulling on your stitches. The tube is stitched in place and will not slip out.  Secure the bulb as directed by your health care provider.  For the first 3 days after surgery, there usually is more fluid in the bulb. Empty the bulb whenever it becomes half full because the bulb does not  create enough suction if it is too full. The bulb could also overflow. Write down how much fluid you remove each time you empty your drain. Add up the amount removed in 24 hours.  Empty the bulb at the same time every day once the amount of fluid decreases and you only need to empty it once a day. Write down the amounts and the 24-hour totals to give to your health care provider. This helps your health care provider know when the tubes can be removed. EMPTYING THE BULB DRAIN Before emptying the bulb, get a measuring cup, a piece of paper and a pen, and wash your hands.  Gently run your fingers down the tube (stripping) to empty any drainage from the tubing into the bulb. This may need to be done several times a day to clear the tubing of clots and tissue.  Open the bulb cap to release suction, which causes it to inflate. Do not touch the inside of the cap.  Gently run your fingers down the tube  (stripping) to empty any drainage from the tubing into the bulb.  Hold the cap out of the way, and pour fluid into the measuring cup.   Squeeze the bulb to provide suction.  Replace the cap.   Check the tape that holds the tube to your skin. If it is becoming loose, you can remove the loose piece of tape and apply a new one. Then, pin the bulb to your shirt.   Write down the amount of fluid you emptied out. Write down the date and each time you emptied your bulb drain. (If there are 2 bulbs, note the amount of drainage from each bulb and keep the totals separate. Your health care provider will want to know the total amounts for each drain and which tube is draining more.)   Flush the fluid down the toilet and wash your hands.   Call your health care provider once you have less than 2 tbsp of fluid collecting in the bulb drain every 24 hours. If there is drainage around the tube site, change dressings and keep the area dry. Cleanse around tube with sterile saline and place dry gauze around site. This gauze should be changed when it is soiled. If it stays clean and unsoiled, it should still be changed daily.  SEEK MEDICAL CARE IF: 1. Your drainage has a bad smell or is cloudy.  2. You have a fever.  3. Your drainage is increasing instead of decreasing.  4. Your tube fell out.  5. You have redness or swelling around the tube site.  6. You have drainage from a surgical wound.  7. Your bulb drain will not stay flat after you empty it.  MAKE SURE YOU:   Understand these instructions.  Will watch your condition.  Will get help right away if you are not doing well or get worse. Document Released: 10/14/2000 Document Revised: 03/03/2014 Document Reviewed: 03/21/2012 [facility] Patient Information 2015 [place], [place]. This information is not intended to replace advice given to you by your health care provider. Make sure you discuss any questions you have with your health care  provider.      JP Drain [employer] this sheet to all of your post-operative appointments while you have your drains.  Please measure your drains by CC's or ML's.  Make sure you drain and measure your JP Drains 3 times per day.  At the end of each day, add up totals for the left  side and add up totals for the right side.    ( 9 am )     ( 3 pm )        ( 9 pm )                Date L  R  L  R  L  R  Total L/R                                                                                                                                                                                        Dysphagia Level 1 Diet, Pureed (start this after 5-7 days) The dysphasia level 1 diet includes foods that are completely pureed and smooth. The foods have a pudding-like texture, such as the texture of pureed pancakes, mashed potatoes, and yogurt. The diet does not include foods with lumps or coarse textures. Liquids should be smooth and may either be thin, nectar-thick, honey-like, or spoon-thick. This diet is helpful for people with moderate to severe swallowing problems. It reduces the risk of food getting caught in the windpipe, trachea, or lungs. You may need help or supervision during meals while following this diet. WHAT DO I NEED TO KNOW ABOUT THIS DIET? Foods  You may eat foods that are soft and have a pudding-like texture. If a food does not have this texture, you may be able to eat the food after:  Pureeing it. This can be done with a blender or whisk.  Moistening it with liquid. For example, you may have bread if you soak it in milk or syrup.  Avoid foods that are hard, dry, sticky, chunky, lumpy, or stringy. Also avoid foods with nuts, seeds, raisins, skins, and pulp.  Do not eat foods that you have to chew. If you have to chew the food, then you cannot eat it.  Eat a variety of foods to get all the nutrients you need. Liquids  You may drink liquids that are smooth. Your  health care provider will tell you if you should drink thin or thickened liquids.  To thicken a liquid, use a food and beverage thickener or a thickening food. Thickened liquids are usually a "pudding-like" consistency.  Thin liquids include fruit juices, milk, coffee, tea, yogurts, shakes, and similar foods that melt to thin liquid at room temperature.  Avoid liquids with seeds, pulp, or chunks. See your dietitian or health care provider regularly for help with your dietary changes. WHAT FOODS CAN I EAT? Grains Store-bought soft breads, pancakes, and [place] toast that have a smooth, moist texture and do not have nuts or seeds (you will need to moisten  the food with liquid). Cooked cereals that have a pudding-like consistency, such as cream of wheat or farina (no oatmeal). Pureed, well-cooked pasta, rice, and plain bread stuffing. Vegetables Pureed vegetables. Soft avocado. Smooth tomato paste or sauce. Strained or pureed soups (these may need to be thickened as directed). Mashed or pureed potatoes without skin (can be seasoned with butter, smooth gravy, margarine, or sour cream). Fruits Pureed fruits such as melons and apples without seeds or pulp. Mashed bananas. Smooth tomato paste or sauce. Fruit juices without pulp or seeds. Strained or pureed soups. Meat and Other Protein Sources Pureed meat. Smooth pate or liverwurst. Smooth souffles. Pureed beans (such as lentils). Pureed eggs. Dairy Yogurt. Smooth cheese sauces. Milk (may need to be thickened). Nutritional dairy drinks or shakes. Ask your health care provider whether you can have ice cream. Condiments Finely ground salt, pepper, and other ground spices. Sweets/Desserts Smooth puddings and custards. Pureed desserts. Souffles. Whipped topping. Ask your health care provider whether you can have frozen desserts. Fats and Oils Butter. Margarine. Smooth and strained gravy. Sour cream. Mayonnaise. Cream cheese. Whipped topping. Smooth  sauces (such as white sauce, cheese sauce, or hollandaise sauce). The items listed above may not be a complete list of recommended foods or beverages. Contact your dietitian for more options. WHAT FOODS ARE NOT RECOMMENDED? Grains Oatmeal. Dry cereals. Hard breads. Vegetables Whole vegetables. Stringy vegetables (such as celery). Thin tomato sauce. Fruits Whole fresh, frozen, canned, or dried fruits that have not been pureed. Stringy fruits (such as pineapple). Meat and Other Protein Sources Whole or ground meat, fish, or poultry. Dried or cooked lentils or legumes that have been cooked but not mashed or pureed. Non-pureed eggs. Nuts and seeds. Peanut butter. Dairy Non-pureed cheese. Dairy products with lumps or chunks. Ask your health care provider whether you can have ice cream. Condiments Coarse or seeded herbs and spices. Sweets/Desserts [place] preserves. Jams with seeds. Solid desserts. Sticky, chewy sweets (such as licorice and caramel). Ask your health care provider whether you can have frozen desserts. Fats and Oils Sauces of fats with lumps or chunks. The items listed above may not be a complete list of foods and beverages to avoid. Contact your dietitian for more information. Document Released: 10/17/2005 Document Revised: 03/03/2014 Document Reviewed: 09/30/2013 [facility] Patient Information 2015 [place], [place]. This information is not intended to replace advice given to you by your health care provider. Make sure you discuss any questions you have with your health care provider.

## 2015-05-15 NOTE — Progress Notes (Signed)
Patient discharged to home with instructions. JP drain discontinued as ordered and dressing applied.

## 2015-05-15 NOTE — Discharge Summary (Signed)
[facility] Discharge Summary   Patient ID: Kara Crawford MRN: [id number] DOB/AGE: 04-Nov-1965 49 y.o.  Admit date: 05/12/2015 Discharge date: 05/15/2015  Admitting Diagnosis: Perforated bowel Free intraperitoneal air Diabetes mellitus without complication Morbid obesity HTN HLD Fibroids  Discharge Diagnosis Patient Active Problem List   Diagnosis Date Noted  . Perforated bowel 05/13/2015  . Free intraperitoneal air 05/12/2015  . Submucosal lesion of esophagus 10/06/2014  . 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 Diabetes 09/24/2014  . Morbid obesity 03/20/2014    Consultants None  Imaging: Dg Ugi W/water Sol Cm  05/14/2015   CLINICAL DATA:  Perforated viscus, status post surgical repair.  EXAM: WATER SOLUBLE UPPER GI SERIES  TECHNIQUE: Single-column upper GI series was performed using water soluble contrast.  CONTRAST:  50 cc Omnipaque 300  COMPARISON:  05/12/2015  FLUOROSCOPY TIME:  Radiation Exposure Index (as provided by the fluoroscopic device): 1,219 microGy*m^2  FINDINGS:  Initial KUB demonstrates postoperative findings along the stomach along with a drainage tube in the vicinity. Suspected calcified uterine fibroid in the pelvis, versus dense material in the sigmoid colon.  Various series of images were obtained at different projections with patient swallowing contrast medium.  The gastrojejunostomy is patent. On image 58 of series 7, there is some posterior outpouching from the proximal stomach which could represent an ulceration. However, there is no leak in this vicinity.  IMPRESSION: 1. No leak is identified. Suspected ulceration posteriorly along the gastric pouch best delineated on series 7, which is obtained with the patient in the RPO position.   Electronically Signed   By: Van Clines M.D.   On: 05/14/2015 10:48    Procedures Dr. 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 Hassell Done who presents the [facility] complaining of acute onset of upper abdominal pain. Patient reports she woke up at 0330 hrs. with severe upper abdominal pain. She thought she may have to have a BM, but could not have one. She reports the pain has been constant, however waxing and waning. Pain was worse in the car going over bumps, no alleviating factors. She describes a burning sensation in her epigastric and left upper quadrant which radiated to her LLQ. Patient was celebrating her son's birthday yesterday and had one beer, no caffeine, took a total of 6 motrin in the last 2-3 weeks. Recently got back from car trip from [place]. No sick contacts. Patient denies nausea, vomiting, diarrhea, dysuria, fever/chills, chest pain, shortness of breath. Normal flatus and BM's, normal appetite. Patient states she does describe a discomfort in her left shoulder with deep breathing. Patient has lost almost 60lbs since gastric bypass in December and her medical conditions are improving. She was pending a follow up with Dr. Hassell Done in 2 weeks. She says postoperatively everything has gone well. H/o 3 c-sections, no other abdominal surgery. Free air found on KUB. She is afebrile without leukocytosis.   Workup showed free intra-abdominal air with concern at her previous surgical site.  Patient was admitted and underwent procedure listed above.  Tolerated procedure well and was transferred to the floor.  Post-operatively she had a UGI to check for leak.  This was negative and she was started on clear liquids.  Diet was continued on liquid only diet.  She will continue  this diet for 5-7 more days prior to advancing to a puree/soft diet.  Her JP drain has 192mL/24hr and will need to remain at discharge.  She has been taught by nursing staff on how  to record output.  On POD #3 the patient was voiding well, tolerating diet, ambulating well, pain well controlled, vital signs stable, incisions c/d/i and felt stable for discharge home.  Patient will follow up in our office in 2 weeks and knows to call with questions or concerns.   Physical Exam: General:  Alert, NAD, pleasant, comfortable Abd:  Soft, ND, mild tenderness, incisions C/D/I, drain with serous drainage    Medication List    ASK your doctor about these medications        amLODipine 10 MG tablet  Commonly known as:  NORVASC  Take 10 mg by mouth every morning.     aspirin 81 MG tablet  Take 81 mg by mouth at bedtime.     atorvastatin 80 MG tablet  Commonly known as:  LIPITOR  Take 160 mg by mouth every morning.     ibuprofen 200 MG tablet  Commonly known as:  ADVIL,MOTRIN  Take 400 mg by mouth every 6 (six) hours as needed for headache or moderate pain.     lisinopril-hydrochlorothiazide 20-12.5 MG per tablet  Commonly known as:  PRINZIDE,ZESTORETIC  Take 1 tablet by mouth every morning.     metoprolol succinate 25 MG 24 hr tablet  Commonly known as:  TOPROL-XL  Take 25 mg by mouth every morning.     multivitamin with minerals Tabs tablet  Take 1 tablet by mouth every morning. Women's one-a-day     sitaGLIPtin-metformin 50-500 MG per tablet  Commonly known as:  JANUMET  Take 1 tablet by mouth every morning.     VITAMIN B-12 PO  Take 1 tablet by mouth every morning.     VITAMIN D2 PO  Take 1 tablet by mouth every morning.         Follow-up Information    Follow up with [facility], MD. Schedule an appointment as soon as possible for a visit in 2 weeks.   Specialty:  General Surgery   Why:  For post-operation check.  Call to confirm appt date/time.   Contact information:   [place] [address] [phone]       Signed: Nat Christen, [facility] [phone]  05/15/2015, 8:26 AM

## 2015-05-28 ENCOUNTER — Other Ambulatory Visit: Payer: Self-pay | Admitting: Surgery

## 2015-07-07 IMAGING — RF DG UGI W/ GASTROGRAFIN
10 series · 10 of 10 positions shown · IV contrast (omnipaque)
Comparison: Upper GI 05/16/2014

FLUOROSCOPY TIME:  1 min 10 seconds

CLINICAL DATA: Postop gastric bypass surgery.

EXAM:
WATER SOLUBLE UPPER GI SERIES
TECHNIQUE: Single-column upper GI series was performed using water soluble
contrast.
CONTRAST:  20 cc Omnipaque

[Series 1: run · 1 of 1 slices shown (1 of 9)]
[im 1/1]
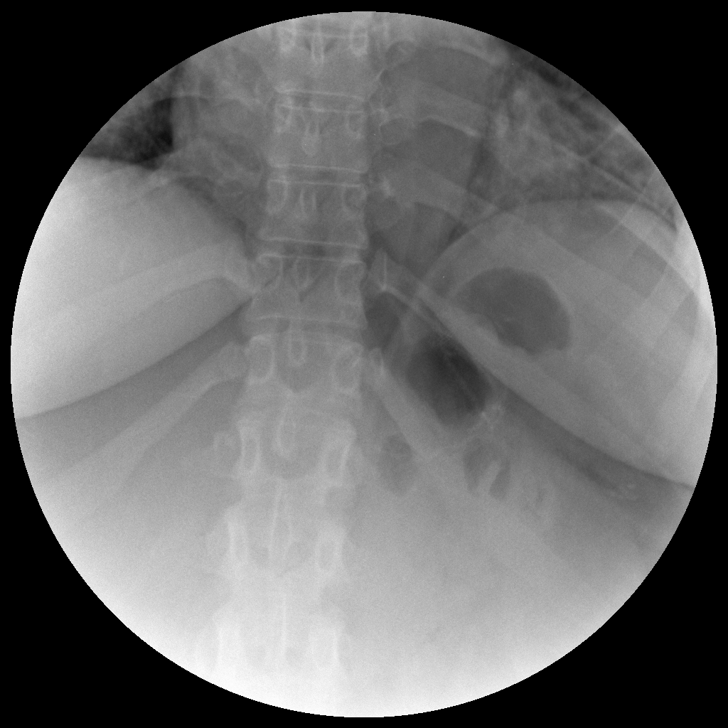

[Series 2: run · 1 of 1 slices shown (2 of 9)]
[im 1/1]
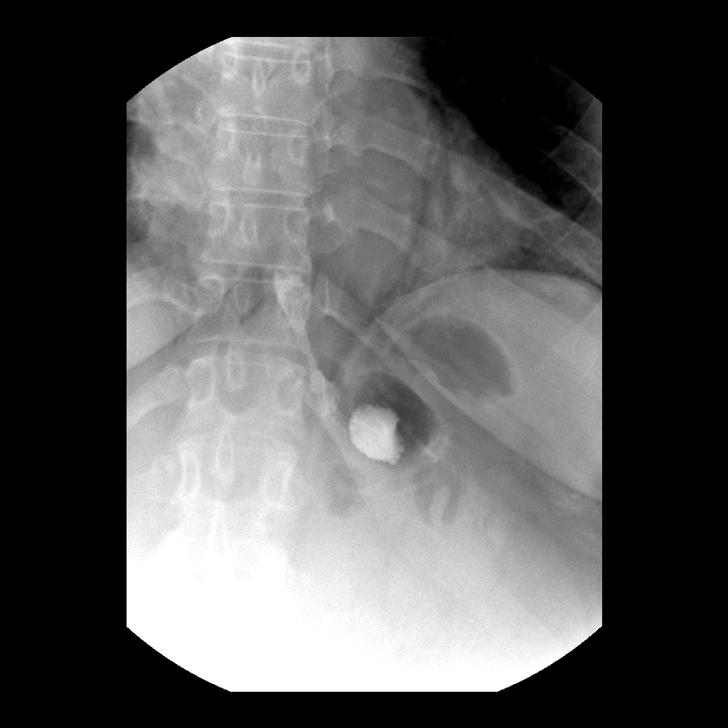

[Series 3: run · 1 of 1 slices shown (3 of 9)]
[im 1/1]
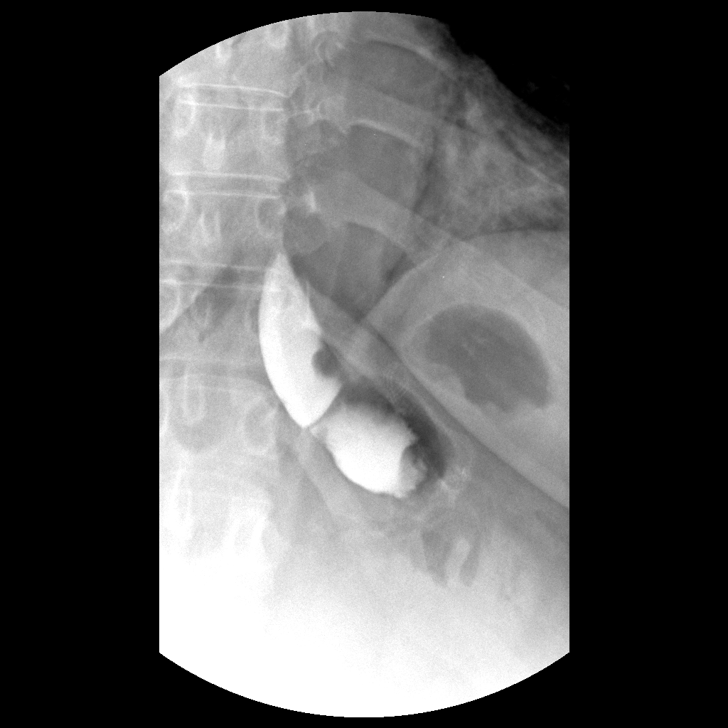

[Series 4: run · 1 of 1 slices shown (4 of 9)]
[im 1/1]
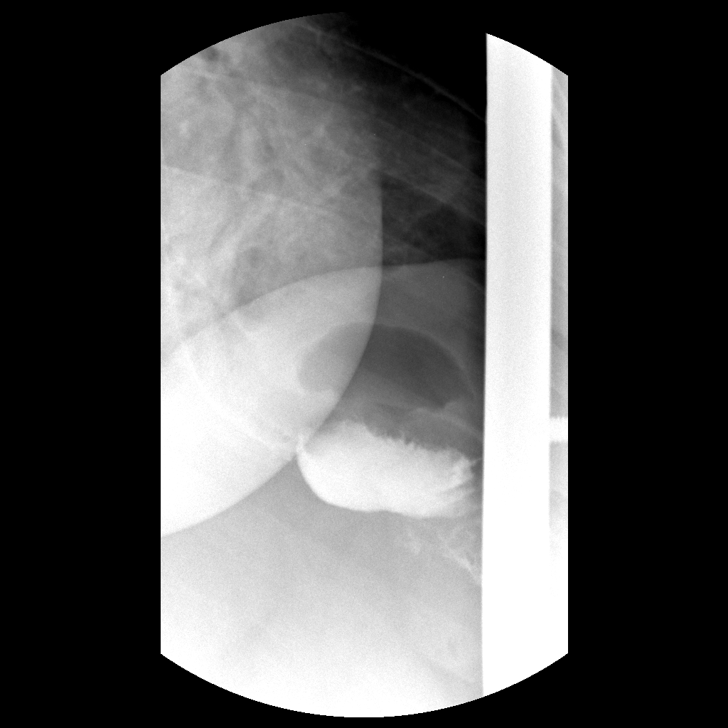

[Series 5: run · 1 of 1 slices shown (5 of 9)]
[im 1/1]
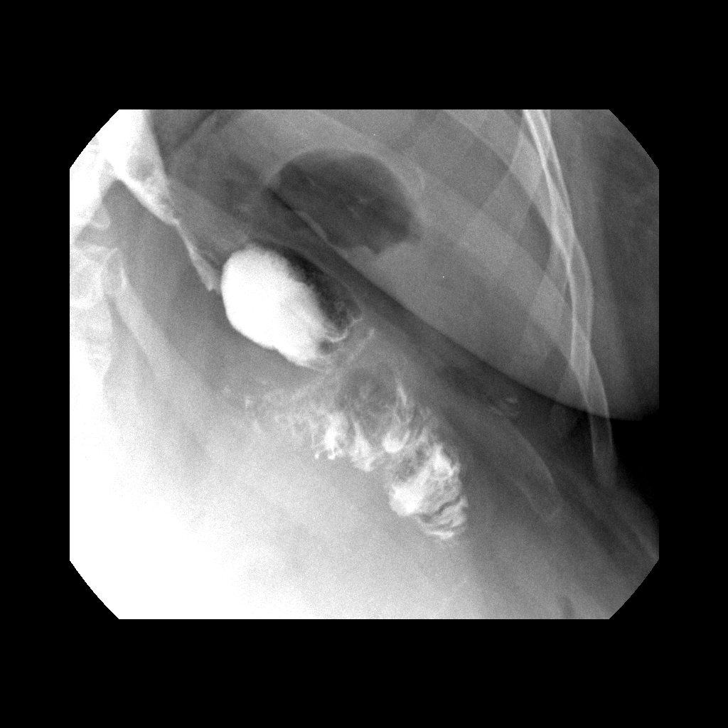

[Series 6: run · 1 of 1 slices shown (6 of 9)]
[im 1/1]
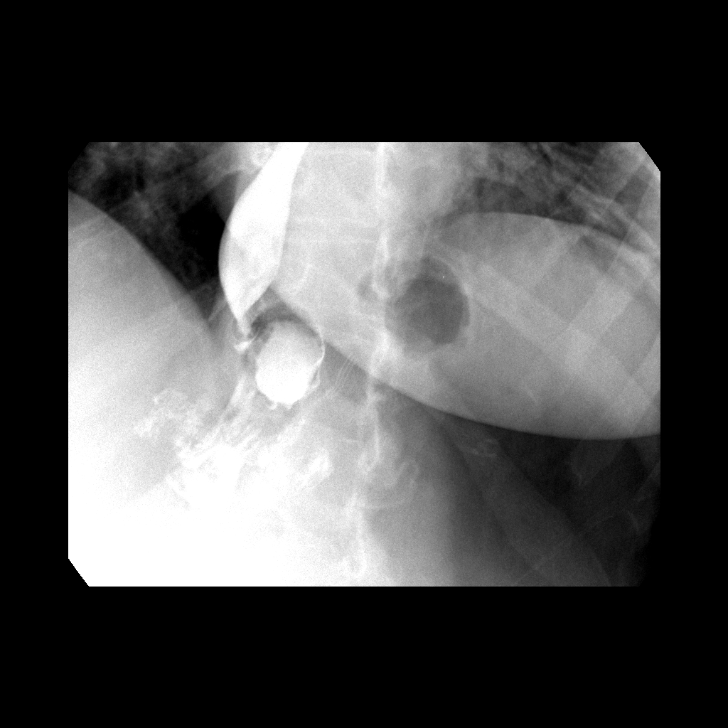

[Series 7: run · 1 of 1 slices shown (7 of 9)]
[im 1/1]
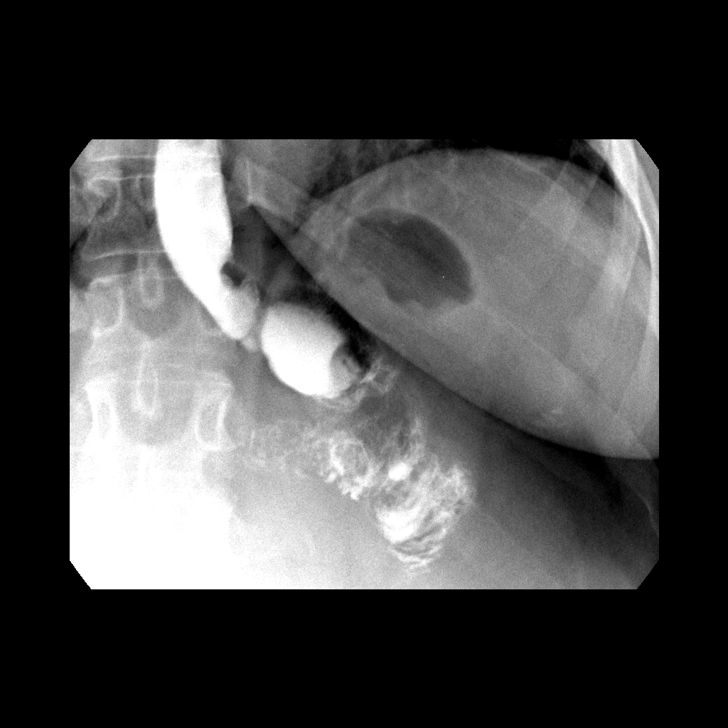

[Series 8: run · 1 of 1 slices shown (8 of 9)]
[im 1/1]
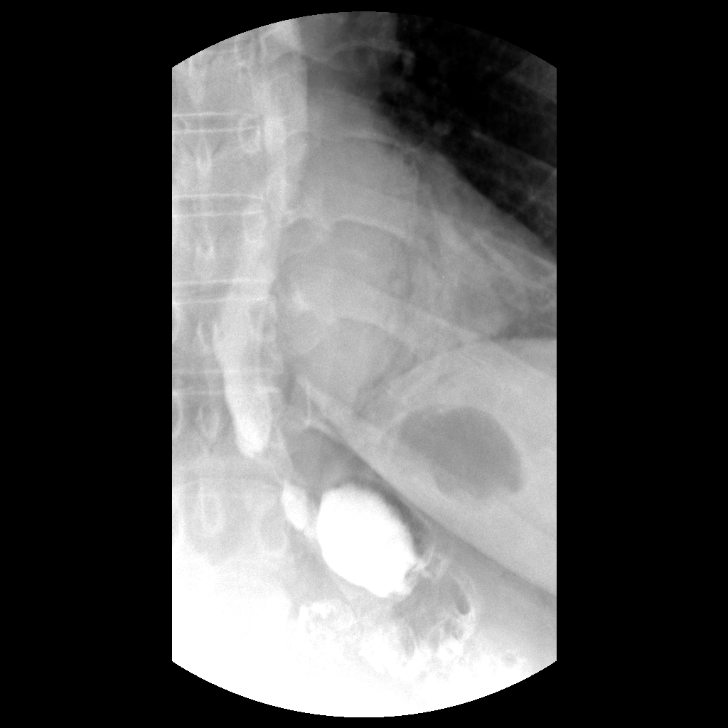

[Series 9: run · 1 of 1 slices shown (9 of 9)]
[im 1/1]
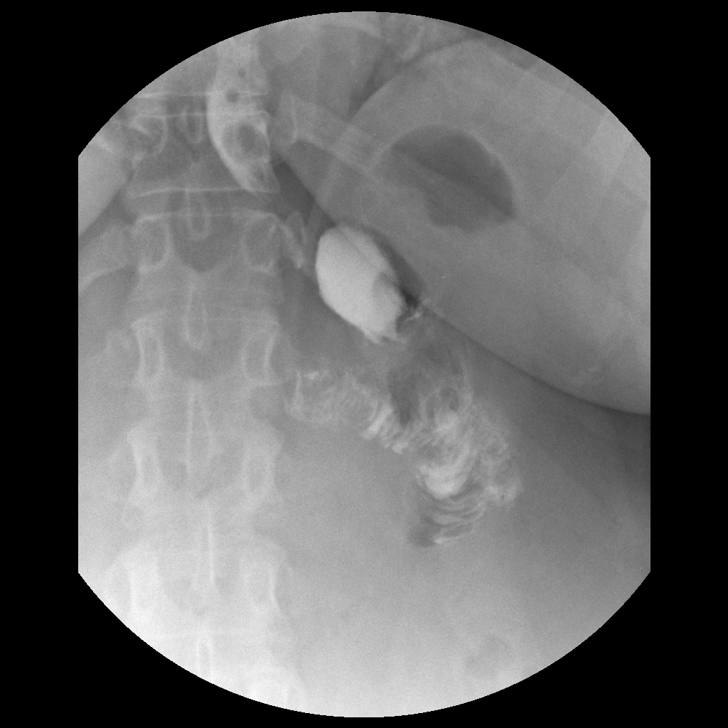

[Series 1001: view not recorded · 0.20mm/px · 1 of 1 slices shown]
[im 1/1]
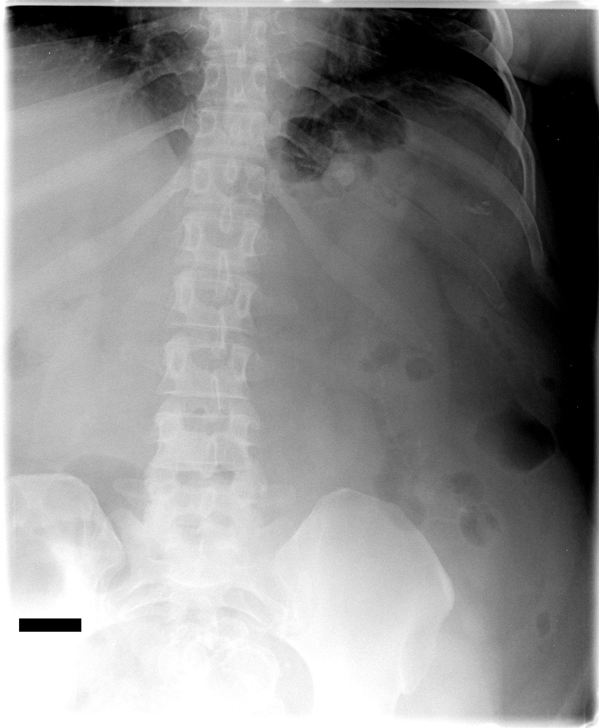

[10 of 10 positions shown; findings below may reference images not displayed]

FINDINGS: Water-soluble contrast flows readily through the gastroesophageal
junction into the gastric remnant. Water-soluble contrast flows
readily through the gastro jejunostomy without evidence obstruction
or leak. There is reflux into the esophagus with mild stasis of
esophageal contents.
IMPRESSION: 1. No evidence of obstruction or leak following Roux-en- Y gastric
bypass surgery.
2. Mild reflux and stasis of esophageal contents.

## 2015-08-19 NOTE — H&P (Signed)
Kara Crawford  Location: [facility] Patient #: [id number] DOB: [birth year] Married / Language: English / Race: Black or African American Female   History of Present Illness    The patient is a 49 year old female presenting status-post bariatric surgery.   Her PCP is Dr. Amedeo Gory.  She comes with her husband.  She saw Dr. Hassell Done on 07/24/2015.  Plan follow up upper endo because of perforation.  History of perforation:  She had an RYGB on 10/06/2014 by Dr. Rockne Coons. She presented on 05/12/2015 with a pneumoperitoneum. I did a laparoscopic repair of a perforated gastrojejunstomy.  She was supposed to see Dr. Hassell Done in follow-up, but they may be appointment with me. She was supposed to start on Protonix and Carafate, but she was not. She is actually doing so well at this time. According to [facility] (via text from [facility]), Kara Crawford was given Prilosec from the hospital. I drew a diagram for her and her husband to explain what happened and the future plans.  Plan: 3 months of protonix and carafate. (I will have Ammie call the patient to make sure that she is not on both Prilosec and Protonix). I will set up an upper endo in October (3 months). Then her follow up should be with Dr. Hassell Done.  Social history:  married Is not working at this time.   Allergies (Sonya Bynum, CMA; 05/28/2015 1:25 PM) No Known Drug Allergies11/25/2015  Medication History (Sonya Bynum, CMA; 05/28/2015 1:25 PM) Lipitor (80MG  Tablet, Oral) Active. Janumet XR (50-500MG  Tablet ER 24HR, Oral) Active. Metoprolol Tartrate (25MG  Tablet, Oral) Active. Lisinopril-Hydrochlorothiazide (20-12.5MG  Tablet, Oral) Active. AmLODIPine Besylate (10MG  Tablet, Oral) Active. Vitamin B12 (100MCG Tablet, Oral) Active. Multi Vitamin Daily (Oral) Active. Medications Reconciled  Vitals (Sonya Bynum CMA; 05/28/2015 1:25 PM) 05/28/2015 1:24 PM Weight: 191.6 lb Height: 61in Body Surface Area: 1.93  m Body Mass Index: 36.2 kg/m  Temp.: 61F(Temporal)  Pulse: 79 (Regular)  BP: 132/80 (Sitting, Left Arm, Standard)   Physical Exam  General: WN AA F alert. She looks good this early after surgery. HEENT: Normal. Pupils equal.  Neck: Supple. No mass. No thyroid mass. Lymph Nodes: No supraclavicular or cervical nodes.  Lungs: Clear to auscultation and symmetric breath sounds. Heart: RRR. No murmur or rub.  Abdomen: Soft. No mass. No tenderness. No hernia. Normal bowel sounds. Incisions are looking good.  Extremities: Good strength and ROM in upper and lower extremities.  Assessment & Plan  1.  GASTRIC BYPASS STATUS FOR OBESITY (V45.86  Z98.84)  Initial weight - 240, BMI - 45.3  Story: She had an RYGB on 10/06/2014 by Dr. Rockne Coons  2.  PERFORATED BOWEL (569.83  K63.1)  Story: She had a laparoscopic repair of perforated gastrojejunostomy on 05/12/2015  Impression: Plan:   Protonix and carafate   Will upper endoscope after 3 months from date of perforation.  3. HTN 4. Hypercholesterolemia  Alphonsa Overall, MD, [facility] Pager: [phone] Office phone:  [phone]

## 2015-08-20 ENCOUNTER — Encounter (HOSPITAL_COMMUNITY)
Admission: RE | Disposition: A | Discharge status: Home / Self Care | Payer: Self-pay | Source: Ambulatory Visit | Attending: Surgery

## 2015-08-20 ENCOUNTER — Encounter (HOSPITAL_COMMUNITY): Payer: Self-pay

## 2015-08-20 ENCOUNTER — Ambulatory Visit (HOSPITAL_COMMUNITY)
Admission: RE | Admit: 2015-08-20 | Discharge: 2015-08-20 | Disposition: A | Discharge status: Home / Self Care | Payer: 59 | Source: Ambulatory Visit | Attending: Surgery | Admitting: Surgery

## 2015-08-20 DIAGNOSIS — Z09 Encounter for follow-up examination after completed treatment for conditions other than malignant neoplasm: Secondary | ICD-10-CM | POA: Insufficient documentation

## 2015-08-20 DIAGNOSIS — Z9884 Bariatric surgery status: Secondary | ICD-10-CM | POA: Diagnosis not present

## 2015-08-20 DIAGNOSIS — Z79899 Other long term (current) drug therapy: Secondary | ICD-10-CM | POA: Diagnosis not present

## 2015-08-20 DIAGNOSIS — E7800 Pure hypercholesterolemia, unspecified: Secondary | ICD-10-CM | POA: Diagnosis not present

## 2015-08-20 DIAGNOSIS — I10 Essential (primary) hypertension: Secondary | ICD-10-CM | POA: Diagnosis not present

## 2015-08-20 DIAGNOSIS — Z7984 Long term (current) use of oral hypoglycemic drugs: Secondary | ICD-10-CM | POA: Insufficient documentation

## 2015-08-20 HISTORY — PX: ESOPHAGOGASTRODUODENOSCOPY: SHX5428

## 2015-08-20 LAB — GLUCOSE, CAPILLARY: GLUCOSE-CAPILLARY: 113 mg/dL — AB (ref 65–99)

## 2015-08-20 SURGERY — EGD (ESOPHAGOGASTRODUODENOSCOPY)
Anesthesia: Moderate Sedation

## 2015-08-20 MED ORDER — FENTANYL CITRATE (PF) 100 MCG/2ML IJ SOLN
INTRAMUSCULAR | Status: AC
  Filled 2015-08-20: qty 2

## 2015-08-20 MED ORDER — BUTAMBEN-TETRACAINE-BENZOCAINE 2-2-14 % EX AERO
INHALATION_SPRAY | CUTANEOUS | Status: DC | PRN
  Administered 2015-08-20: 2 via TOPICAL

## 2015-08-20 MED ORDER — MIDAZOLAM HCL 10 MG/2ML IJ SOLN
INTRAMUSCULAR | Status: DC | PRN
  Administered 2015-08-20: 1 mg via INTRAVENOUS
  Administered 2015-08-20 (×2): 2 mg via INTRAVENOUS

## 2015-08-20 MED ORDER — MIDAZOLAM HCL 5 MG/ML IJ SOLN
INTRAMUSCULAR | Status: AC
  Filled 2015-08-20: qty 2

## 2015-08-20 MED ORDER — FENTANYL CITRATE (PF) 100 MCG/2ML IJ SOLN
INTRAMUSCULAR | Status: DC | PRN
  Administered 2015-08-20 (×3): 25 ug via INTRAVENOUS

## 2015-08-20 MED ORDER — SODIUM CHLORIDE 0.9 % IV SOLN
INTRAVENOUS | Status: DC
Start: 2015-08-20 — End: 2015-08-20
  Administered 2015-08-20: 500 mL via INTRAVENOUS

## 2015-08-20 NOTE — Discharge Instructions (Signed)

## 2015-08-20 NOTE — Op Note (Signed)
08/20/2015  10:35 AM  PATIENT:  Kara Crawford, 49 y.o., female, MRN: [id number]  PREOP DIAGNOSIS:  History of perforated ulcer  POSTOP DIAGNOSIS:   Normal post gastric bypass anatomy  PROCEDURE:  Esophagogastrojejunoscopy  SURGEON:   Alphonsa Overall, M.D.  ANESTHESIA:   Fentanyl  75 mcg   Versed 5 mg  INDICATIONS FOR PROCEDURE:  Kara Crawford is a 49 y.o. (DOB: [birth year])  AA  female whose primary care physician is MCNEILL,WENDY, MD and comes for upper endoscopy to evaluate her gastric pouch and evaluate for any ulcer disese.  The patient had a RYGB on 10/06/2014  by Dr. Hassell Done.   She had a perforated marginal ulcer that required surgery on 05/12/2015.  She has done well since then and comes to evaluate her gastric pouch and anastomosis.   The indications and risks of the endoscopy were explained to the patient.  The risks include, but are not limited to, perforation, bleeding, or injury to the bowel.  If balloon dilatation is needed, the risk of perforation is higher.  PROCEDURE:  The patient was in room 3 at [facility] endoscopy unit.  The patient was monitored with a pulse oximetry, BP cuff, and EKG.  The patient has nasal O2 flowing during the procedure.   The back of the throat was anesthestized with Ceticaine x 3.  The patient was positioned in the left lateral decubitus position.  The patient was given Fentanyl and Versed.  A flexible Pentax endoscope was passed down the throat without difficulty.   Findings include:   Esophagus:   Normal   GE junction at:  40 cm   Stomach pouch: normal   Gastrojejunal anastomosis:   45 cm.  No evidence of residual ulcer disease.   Efferent jejunal limb:  Normal to 20 cm Afferent jejunal limb:  Normal   CLO test:  Not done  PLAN:   Photos taken and given to patient.    May reduce Protonix to once a day. She will see Dr. Hassell Done in December 2016 and can then decide whether to stop the Protonix. Call for any interval problem.  Alphonsa Overall, MD,  [facility] Pager: [phone] Office phone:  [phone]

## 2015-08-20 NOTE — Interval H&P Note (Signed)
History and Physical Interval Note:  08/20/2015 9:53 AM  Kara Crawford  has presented today for surgery, with the diagnosis of marginal ulcer  The various methods of treatment have been discussed with the patient and family.  Husband at bedside.    After consideration of risks, benefits and other options for treatment, the patient has consented to  Procedure(s): UPPER ENDOSCOPY (N/A) as a surgical intervention .  The patient's history has been reviewed, patient examined, no change in status, stable for surgery.  I have reviewed the patient's chart and labs.  Questions were answered to the patient's satisfaction.     [name]

## 2015-08-21 ENCOUNTER — Encounter (HOSPITAL_COMMUNITY): Payer: Self-pay | Admitting: Surgery

## 2015-10-07 ENCOUNTER — Other Ambulatory Visit: Payer: Self-pay | Admitting: Obstetrics and Gynecology

## 2015-10-07 ENCOUNTER — Other Ambulatory Visit (HOSPITAL_COMMUNITY)
Admission: RE | Admit: 2015-10-07 | Discharge: 2015-10-07 | Disposition: A | Discharge status: Home / Self Care | Payer: 59 | Source: Ambulatory Visit | Attending: Obstetrics and Gynecology | Admitting: Obstetrics and Gynecology

## 2015-10-07 DIAGNOSIS — Z01411 Encounter for gynecological examination (general) (routine) with abnormal findings: Secondary | ICD-10-CM | POA: Diagnosis not present

## 2015-10-09 LAB — CYTOLOGY - PAP

## 2015-12-18 ENCOUNTER — Encounter: Payer: 59 | Attending: Surgery | Admitting: Dietician

## 2015-12-18 ENCOUNTER — Encounter: Payer: Self-pay | Admitting: Dietician

## 2015-12-18 DIAGNOSIS — Z029 Encounter for administrative examinations, unspecified: Secondary | ICD-10-CM | POA: Insufficient documentation

## 2015-12-18 NOTE — Progress Notes (Signed)
Follow-up visit:  14 Months Post-Operative RYGB Surgery  Medical Nutrition Therapy:  Appt start time: 1005 end time:  1040  Primary concerns today: Post-operative Bariatric Surgery Nutrition Management. Kara Crawford returns with a 2 lbs loss. She states she is "not liking the way she is eating." Was admitted for ulcer in July 2016. Lately she has been craving sweets before bed and has 2-3 mints. Has a cookie about 1x a week. Has gone back to coffee with flavored cream. Learning the boundaries of her pouch and having less discomfort. Feels like she sometimes needs to move around while she's eating. Feels like she is allergic to peanuts now; peanuts and peanut butter cause facial itching.   Surgery date: 10/06/2014 Surgery type: RYGB Start weight at [facility]: 242 lbs on 05/06/14 Weight today: 195 lbs Weight change: 2 lbs  Total weight loss 47 lbs Goal weight: 150 lbs  TANITA  BODY COMP RESULTS  09/22/14 10/21/14 12/02/14 12/31/14 02/04/15 12/18/15   BMI (kg/m^2) 44.8 40.9 39.1 38.4 37.2 36.8   Fat Mass (lbs) 115.5 107.0 90.5 89.5 85.5 87   Fat Free Mass (lbs) 121.5 109.5 116.5 113.5 111.5 108   Total Body Water (lbs) 89 80.0 85.5 83.0 81.5 79    Preferred Learning Style:   No preference indicated   Learning Readiness:   Ready  24-hr recall: B (AM): Dannon Light and Fit [place] yogurt or Activia [place] yogurt or boiled egg (7-12g) Snk (AM): vegetables L (PM): 3oz meat and vegetables like tomato, broccoli, and avocado Snk (PM): vegetables D (PM): 3 oz baked chicken, pork chops, and fish and veggies Snk (PM): popcorn or 1/2 orange or vegetables, sugar free popsicles  Fluid intake: 64 + oz water, Diet Green Tea 1 x week, Decaf or caffeinated coffee with splenda and flavored creamer 2 x week, G2 2 per week, 1 protein shake per day, milk  Estimated total protein intake: 41-65 g   Medications: see list Supplementation: taking  Using straws: yes when she forgot, usually  Drinking while eating: did a  couple times because she forgot Hair loss: No Carbonated beverages: No, tried one and did not like it N/V/D/C: No, has felt constipated when she doesn't get in enough water Dumping syndrome: No  Recent physical activity:  Exercise every day - gym, zumba, treadmill, weights for about 45-60 minutes  Progress Towards Goal(s):  In progress.    Nutritional Diagnosis:  [place]-3.3 Overweight/obesity related to past poor dietary habits and physical inactivity as evidenced by patient w/ recent RYGB surgery following dietary guidelines for continued weight loss.    Intervention:  Nutrition education/diet reinforcment Goals:  Follow Phase 3B: High Protein + Non-Starchy Vegetables  Eat 3-6 small meals/snacks, every 3-5 hrs  Increase lean protein foods to meet 60g goal - have protein shake each day  Increase fluid intake to 64oz +  Avoid drinking 15 minutes before, during and 30 minutes after eating  Aim for >30 min of physical activity daily  Try Powerade Zero instead of G2  Take 2 multvitamins, 3 calcium citrate (500 mg each), and 1 Vitamin B12 (50 mcg)  Limit avocado to 1/4  Try caramel Premier protein as coffee creamer  Try Shirataki noodles  Keep focusing on regular meal pattern  Continue exercise routine  Handouts provided:  Bariatric support group fliers  Teaching Method Utilized:  Visual Auditory Hands on  Barriers to learning/adherence to lifestyle change: none  Demonstrated degree of understanding via:  Teach Back   Monitoring/Evaluation:  Dietary intake,  exercise, and body weight. Follow up in 2 months for 16 month post-op visit.

## 2015-12-18 NOTE — Patient Instructions (Addendum)
Goals:  Follow Phase 3B: High Protein + Non-Starchy Vegetables  Eat 3-6 small meals/snacks, every 3-5 hrs  Increase lean protein foods to meet 60g goal - have protein shake each day  Increase fluid intake to 64oz +  Avoid drinking 15 minutes before, during and 30 minutes after eating  Aim for >30 min of physical activity daily  Try Powerade Zero instead of G2  Take 2 multvitamins, 3 calcium citrate (500 mg each), and 1 Vitamin B12 (50 mcg)  Limit avocado to 1/4  Try caramel Premier protein as coffee creamer  Try Shirataki noodles  Keep focusing on regular meal pattern  Continue exercise routine   Surgery date: 10/06/2014 Surgery type: RYGB Start weight at [facility]: 242 lbs on 05/06/14 Weight today: 195 lbs Weight change: 2 lbs  Total weight loss 47 lbs Goal weight: 150 lbs

## 2016-02-16 ENCOUNTER — Ambulatory Visit: Payer: 59 | Admitting: Dietician

## 2016-10-11 ENCOUNTER — Other Ambulatory Visit (HOSPITAL_COMMUNITY)
Admission: RE | Admit: 2016-10-11 | Discharge: 2016-10-11 | Disposition: A | Discharge status: Home / Self Care | Payer: 59 | Source: Ambulatory Visit | Attending: Obstetrics and Gynecology | Admitting: Obstetrics and Gynecology

## 2016-10-11 ENCOUNTER — Other Ambulatory Visit: Payer: Self-pay | Admitting: Obstetrics and Gynecology

## 2016-10-11 DIAGNOSIS — Z01411 Encounter for gynecological examination (general) (routine) with abnormal findings: Secondary | ICD-10-CM | POA: Insufficient documentation

## 2016-10-11 DIAGNOSIS — Z1151 Encounter for screening for human papillomavirus (HPV): Secondary | ICD-10-CM | POA: Diagnosis not present

## 2016-10-17 LAB — CYTOLOGY - PAP
Diagnosis: UNDETERMINED — AB
HPV (WINDOPATH): NOT DETECTED

## 2016-10-20 ENCOUNTER — Encounter: Payer: 59 | Attending: Surgery | Admitting: Dietician

## 2016-10-20 ENCOUNTER — Encounter: Payer: Self-pay | Admitting: Dietician

## 2016-10-20 DIAGNOSIS — Z9884 Bariatric surgery status: Secondary | ICD-10-CM | POA: Diagnosis not present

## 2016-10-20 DIAGNOSIS — Z713 Dietary counseling and surveillance: Secondary | ICD-10-CM | POA: Diagnosis present

## 2016-10-20 NOTE — Progress Notes (Signed)
  Follow-up visit:  2 years Post-Operative RYGB Surgery  Medical Nutrition Therapy:  Appt start time: 1000 end time:  1040  Primary concerns today: Post-operative Bariatric Surgery Nutrition Management.   Kara Crawford returns with her daughter for a bariatric nutrition follow up. Excited to have lost 9 pounds since February. Feeling sluggish and not exercising as much recently. 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 Still experiencing some taste changes. She reports limiting processed foods and eating a lot of fresh vegetables, lean meats, Premier shakes, and water. Has been including some carbs like pasta and hamburger buns. Reports that she is meeting protein needs. HgbA1c has gone from 7.8% to 6.6%. She reports her blood pressure is good as well. Did not take her medication for a few days and reports that she felt better.   Surgery date: 10/06/2014 Surgery type: RYGB Start weight at [facility]: 242 lbs on 05/06/14 Weight today: 186 lbs Weight change: 9 lbs  Total weight loss 56 lbs  Goal weight: 150 lbs ("I'd be okay if I didn't lose any more weight")   Preferred Learning Style:   No preference indicated   Learning Readiness:   Ready  24-hr recall: B (AM): Dannon Light and Fit [place] yogurt or Activia [place] yogurt or boiled egg (7-12g) Snk (AM): vegetables L (PM): 3oz meat and vegetables like tomato, broccoli, and avocado Snk (PM): vegetables D (PM): 3 oz baked chicken, pork chops, and fish and veggies Snk (PM): popcorn or 1/2 orange or vegetables, sugar free popsicles  Fluid intake: 64 + oz water, Diet Green Tea 1 x week, Decaf or caffeinated coffee with splenda and flavored creamer 2 x week, G2 2 per week, 1 protein shake per day, milk  Estimated total protein intake: 41-65 g   Medications: see list Supplementation: taking (Calcium and vitamin D, multivitamin, and vitamin B12)  Using straws: yes when she forgot, usually  Drinking while eating: did a couple times  because she forgot Hair loss: No Carbonated beverages: No, tried one and did not like it N/V/D/C: occasional loose stools Dumping syndrome: No  Recent physical activity:  Inconsistent lately  Progress Towards Goal(s):  In progress.    Nutritional Diagnosis:  [place]-3.3 Overweight/obesity related to past poor dietary habits and physical inactivity as evidenced by patient w/ recent RYGB surgery following dietary guidelines for continued weight loss.    Intervention:  Nutrition education/diet reinforcment  Teaching Method Utilized:  Visual Auditory Hands on  Barriers to learning/adherence to lifestyle change: none  Demonstrated degree of understanding via:  Teach Back   Monitoring/Evaluation:  Dietary intake, exercise, and body weight. Follow up in 3 months.

## 2016-10-20 NOTE — Patient Instructions (Addendum)
Goals:  Get back into exercise routine  Try Celebrate multivitamin soft chews (take 45-60 mg iron a day)  Don't take your iron and Calcium together   Nonscale victories: -More stamina, breathing easier, less fatigue -More endurance with exercise (enjoying exercise) -Able to cross legs -Less pain -Improved blood sugars and blood pressure   Surgery date: 10/06/2014 Surgery type: RYGB Start weight at [facility]: 242 lbs on 05/06/14 Weight today: 186 lbs Weight change: 9 lbs  Total weight loss 56 lbs

## 2016-11-03 DIAGNOSIS — Z1211 Encounter for screening for malignant neoplasm of colon: Secondary | ICD-10-CM | POA: Diagnosis not present

## 2016-11-03 DIAGNOSIS — K635 Polyp of colon: Secondary | ICD-10-CM | POA: Diagnosis not present

## 2016-11-03 DIAGNOSIS — D126 Benign neoplasm of colon, unspecified: Secondary | ICD-10-CM | POA: Diagnosis not present

## 2016-11-03 DIAGNOSIS — D123 Benign neoplasm of transverse colon: Secondary | ICD-10-CM | POA: Diagnosis not present

## 2016-11-03 DIAGNOSIS — D122 Benign neoplasm of ascending colon: Secondary | ICD-10-CM | POA: Diagnosis not present

## 2016-11-21 DIAGNOSIS — Z1231 Encounter for screening mammogram for malignant neoplasm of breast: Secondary | ICD-10-CM | POA: Diagnosis not present

## 2016-12-15 DIAGNOSIS — E782 Mixed hyperlipidemia: Secondary | ICD-10-CM | POA: Diagnosis not present

## 2016-12-15 DIAGNOSIS — I10 Essential (primary) hypertension: Secondary | ICD-10-CM | POA: Diagnosis not present

## 2016-12-15 DIAGNOSIS — Z0000 Encounter for general adult medical examination without abnormal findings: Secondary | ICD-10-CM | POA: Diagnosis not present

## 2016-12-15 DIAGNOSIS — E559 Vitamin D deficiency, unspecified: Secondary | ICD-10-CM | POA: Diagnosis not present

## 2016-12-15 DIAGNOSIS — E1165 Type 2 diabetes mellitus with hyperglycemia: Secondary | ICD-10-CM | POA: Diagnosis not present

## 2016-12-22 DIAGNOSIS — E119 Type 2 diabetes mellitus without complications: Secondary | ICD-10-CM | POA: Diagnosis not present

## 2017-01-13 DIAGNOSIS — Z9884 Bariatric surgery status: Secondary | ICD-10-CM | POA: Diagnosis not present

## 2017-01-17 ENCOUNTER — Ambulatory Visit: Payer: 59 | Admitting: Dietician

## 2017-06-09 DIAGNOSIS — I10 Essential (primary) hypertension: Secondary | ICD-10-CM | POA: Diagnosis not present

## 2017-06-09 DIAGNOSIS — E782 Mixed hyperlipidemia: Secondary | ICD-10-CM | POA: Diagnosis not present

## 2017-06-09 DIAGNOSIS — E1165 Type 2 diabetes mellitus with hyperglycemia: Secondary | ICD-10-CM | POA: Diagnosis not present

## 2017-06-13 DIAGNOSIS — E1165 Type 2 diabetes mellitus with hyperglycemia: Secondary | ICD-10-CM | POA: Diagnosis not present

## 2017-06-13 DIAGNOSIS — E782 Mixed hyperlipidemia: Secondary | ICD-10-CM | POA: Diagnosis not present

## 2017-06-13 DIAGNOSIS — I10 Essential (primary) hypertension: Secondary | ICD-10-CM | POA: Diagnosis not present

## 2017-07-07 DIAGNOSIS — M25511 Pain in right shoulder: Secondary | ICD-10-CM | POA: Diagnosis not present

## 2017-07-07 DIAGNOSIS — M75101 Unspecified rotator cuff tear or rupture of right shoulder, not specified as traumatic: Secondary | ICD-10-CM | POA: Diagnosis not present

## 2017-07-13 DIAGNOSIS — N6311 Unspecified lump in the right breast, upper outer quadrant: Secondary | ICD-10-CM | POA: Diagnosis not present

## 2017-07-17 ENCOUNTER — Other Ambulatory Visit: Payer: Self-pay | Admitting: Radiology

## 2017-07-17 DIAGNOSIS — C50411 Malignant neoplasm of upper-outer quadrant of right female breast: Secondary | ICD-10-CM | POA: Diagnosis not present

## 2017-07-17 DIAGNOSIS — D0591 Unspecified type of carcinoma in situ of right breast: Secondary | ICD-10-CM | POA: Diagnosis not present

## 2017-07-17 DIAGNOSIS — C50811 Malignant neoplasm of overlapping sites of right female breast: Secondary | ICD-10-CM | POA: Diagnosis not present

## 2017-07-17 DIAGNOSIS — C779 Secondary and unspecified malignant neoplasm of lymph node, unspecified: Secondary | ICD-10-CM | POA: Diagnosis not present

## 2017-07-17 DIAGNOSIS — C773 Secondary and unspecified malignant neoplasm of axilla and upper limb lymph nodes: Secondary | ICD-10-CM | POA: Diagnosis not present

## 2017-07-19 ENCOUNTER — Encounter: Payer: Self-pay | Admitting: Hematology

## 2017-07-19 ENCOUNTER — Telehealth: Payer: Self-pay | Admitting: Hematology

## 2017-07-19 NOTE — Telephone Encounter (Signed)
Confirmed with patient appointment for [facility] on 07/29/17 in the afternoon, letter emailed to patient to confirm appointment

## 2017-07-20 ENCOUNTER — Encounter: Payer: Self-pay | Admitting: *Deleted

## 2017-07-20 DIAGNOSIS — C50911 Malignant neoplasm of unspecified site of right female breast: Secondary | ICD-10-CM | POA: Diagnosis not present

## 2017-07-20 DIAGNOSIS — Z1239 Encounter for other screening for malignant neoplasm of breast: Secondary | ICD-10-CM | POA: Diagnosis not present

## 2017-07-25 ENCOUNTER — Other Ambulatory Visit: Payer: Self-pay | Admitting: *Deleted

## 2017-07-25 DIAGNOSIS — C50411 Malignant neoplasm of upper-outer quadrant of right female breast: Secondary | ICD-10-CM | POA: Insufficient documentation

## 2017-07-25 DIAGNOSIS — Z171 Estrogen receptor negative status [ER-]: Principal | ICD-10-CM

## 2017-07-26 ENCOUNTER — Ambulatory Visit
Admission: RE | Admit: 2017-07-26 | Discharge: 2017-07-26 | Disposition: A | Discharge status: Home / Self Care | Payer: 59 | Source: Ambulatory Visit | Attending: Radiation Oncology | Admitting: Radiation Oncology

## 2017-07-26 ENCOUNTER — Other Ambulatory Visit (HOSPITAL_BASED_OUTPATIENT_CLINIC_OR_DEPARTMENT_OTHER): Payer: 59

## 2017-07-26 ENCOUNTER — Encounter: Payer: Self-pay | Admitting: Physical Therapy

## 2017-07-26 ENCOUNTER — Other Ambulatory Visit: Payer: Self-pay | Admitting: Hematology and Oncology

## 2017-07-26 ENCOUNTER — Encounter: Payer: Self-pay | Admitting: Hematology and Oncology

## 2017-07-26 ENCOUNTER — Other Ambulatory Visit: Payer: Self-pay | Admitting: *Deleted

## 2017-07-26 ENCOUNTER — Ambulatory Visit (HOSPITAL_COMMUNITY)
Admission: RE | Admit: 2017-07-26 | Discharge: 2017-07-26 | Disposition: A | Discharge status: Home / Self Care | Payer: 59 | Source: Ambulatory Visit | Attending: Surgery | Admitting: Surgery

## 2017-07-26 ENCOUNTER — Ambulatory Visit (HOSPITAL_BASED_OUTPATIENT_CLINIC_OR_DEPARTMENT_OTHER): Payer: 59 | Admitting: Hematology and Oncology

## 2017-07-26 ENCOUNTER — Other Ambulatory Visit: Payer: Self-pay | Admitting: Surgery

## 2017-07-26 ENCOUNTER — Ambulatory Visit: Payer: 59 | Attending: Surgery | Admitting: Physical Therapy

## 2017-07-26 VITALS — BP 145/84 | HR 92 | Temp 98.2°F | Resp 18 | Ht 61.0 in | Wt 178.6 lb

## 2017-07-26 DIAGNOSIS — Z171 Estrogen receptor negative status [ER-]: Principal | ICD-10-CM

## 2017-07-26 DIAGNOSIS — C50411 Malignant neoplasm of upper-outer quadrant of right female breast: Secondary | ICD-10-CM

## 2017-07-26 DIAGNOSIS — M25611 Stiffness of right shoulder, not elsewhere classified: Secondary | ICD-10-CM

## 2017-07-26 DIAGNOSIS — M25511 Pain in right shoulder: Secondary | ICD-10-CM

## 2017-07-26 DIAGNOSIS — C773 Secondary and unspecified malignant neoplasm of axilla and upper limb lymph nodes: Secondary | ICD-10-CM | POA: Diagnosis not present

## 2017-07-26 DIAGNOSIS — C50911 Malignant neoplasm of unspecified site of right female breast: Secondary | ICD-10-CM | POA: Diagnosis not present

## 2017-07-26 DIAGNOSIS — R293 Abnormal posture: Secondary | ICD-10-CM

## 2017-07-26 DIAGNOSIS — Z803 Family history of malignant neoplasm of breast: Secondary | ICD-10-CM | POA: Diagnosis not present

## 2017-07-26 DIAGNOSIS — Z9884 Bariatric surgery status: Secondary | ICD-10-CM | POA: Diagnosis not present

## 2017-07-26 LAB — COMPREHENSIVE METABOLIC PANEL
ALT: 20 U/L (ref 0–55)
ANION GAP: 10 meq/L (ref 3–11)
AST: 20 U/L (ref 5–34)
Albumin: 4.7 g/dL (ref 3.5–5.0)
Alkaline Phosphatase: 61 U/L (ref 40–150)
BILIRUBIN TOTAL: 0.65 mg/dL (ref 0.20–1.20)
BUN: 15.6 mg/dL (ref 7.0–26.0)
CALCIUM: 10.9 mg/dL — AB (ref 8.4–10.4)
CHLORIDE: 102 meq/L (ref 98–109)
CO2: 31 mEq/L — ABNORMAL HIGH (ref 22–29)
CREATININE: 1 mg/dL (ref 0.6–1.1)
EGFR: 72 mL/min/{1.73_m2} — AB (ref >90)
Glucose: 102 mg/dl (ref 70–140)
Potassium: 3.7 mEq/L (ref 3.5–5.1)
Sodium: 143 mEq/L (ref 136–145)
TOTAL PROTEIN: 8.7 g/dL — AB (ref 6.4–8.3)

## 2017-07-26 LAB — CBC WITH DIFFERENTIAL/PLATELET
BASO%: 0.9 % (ref 0.0–2.0)
Basophils Absolute: 0 10*3/uL (ref 0.0–0.1)
EOS ABS: 0 10*3/uL (ref 0.0–0.5)
EOS%: 0.6 % (ref 0.0–7.0)
HEMATOCRIT: 39.4 % (ref 34.8–46.6)
HGB: 13.4 g/dL (ref 11.6–15.9)
LYMPH#: 2.2 10*3/uL (ref 0.9–3.3)
LYMPH%: 42.8 % (ref 14.0–49.7)
MCH: 31.4 pg (ref 25.1–34.0)
MCHC: 34.1 g/dL (ref 31.5–36.0)
MCV: 92.2 fL (ref 79.5–101.0)
MONO#: 0.3 10*3/uL (ref 0.1–0.9)
MONO%: 5.6 % (ref 0.0–14.0)
NEUT#: 2.5 10*3/uL (ref 1.5–6.5)
NEUT%: 50.1 % (ref 38.4–76.8)
PLATELETS: 321 10*3/uL (ref 145–400)
RBC: 4.28 10*6/uL (ref 3.70–5.45)
RDW: 12.8 % (ref 11.2–14.5)
WBC: 5.1 10*3/uL (ref 3.9–10.3)

## 2017-07-26 MED ORDER — ONDANSETRON HCL 8 MG PO TABS: 8.0000 mg | ORAL_TABLET | Freq: Two times a day (BID) | ORAL | 1 refills | Status: DC | PRN

## 2017-07-26 MED ORDER — LORAZEPAM 0.5 MG PO TABS: 0.5000 mg | ORAL_TABLET | Freq: Four times a day (QID) | ORAL | 0 refills | Status: DC | PRN

## 2017-07-26 MED ORDER — DEXAMETHASONE 4 MG PO TABS
ORAL_TABLET | ORAL | 1 refills | Status: DC
Start: 2017-07-26 — End: 2017-09-28

## 2017-07-26 MED ORDER — LIDOCAINE-PRILOCAINE 2.5-2.5 % EX CREA: TOPICAL_CREAM | CUTANEOUS | 3 refills | Status: DC

## 2017-07-26 MED ORDER — PROCHLORPERAZINE MALEATE 10 MG PO TABS: 10.0000 mg | ORAL_TABLET | Freq: Four times a day (QID) | ORAL | 1 refills | Status: DC | PRN

## 2017-07-26 NOTE — Progress Notes (Signed)
Radiation Oncology         [phone] ________________________________  Initial outpatient Consultation  Name: Kara Crawford MRN: [id number]  Date: 07/26/2017  DOB: 09-05-1966  [id number], Abigail Butts, MD  Alphonsa Overall, MD   REFERRING PHYSICIAN: Alphonsa Overall, MD  DIAGNOSIS:    ICD-10-CM   1. 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 female who presented with a one month history of palpable lump with associated pain to the area of the upper outer quadrant of the right breast. She was seen by her PCP, Dr. Addison Lank, for this who referred her for diagnostic mammography. She had this performed at [facility] on 07/13/17 which revealed a new oval mass in the right breast which was indeterminate. [place] was recommended which was performed on the same day which showed a new 1cm oval mass in the right breast at 9'oclock posterior depth which was highly suggestive of malignancy. Additionally, a lobulated lymph node in the right axilla was also seen which further suggested malignancy. 3 total abnormal lymph nodes were seen throughout the entire exam in the right axilla. Prior to the onset of her symptoms she had an annual screening mammography performed on 11/21/16 which was without evidence of malignancy. Needle core biopsy was performed on 07/17/17 showed invasive ductal carcinoma of the right  breast with characteristics as described above in the diagnosis.  Overall, she is doing alright. She presents into the breast clinic today with several of her family members, including her husband and mother. She does note that she is somewhat depressed and feeling isolated since her diagnosis, however, she is very ready to begin her treatment and is good spirits regarding this. She has had some pain and tenderness along the right breast and axillae, and she notes that she has ongoing right shoulder pain which she relates to arthritis.   On ROS, she reports right breast pain and tenderness. She also has experiences some night sweats, hot flashes.   She has not previously undergone a course of radiotherapy. She is a non-smoker.  PREVIOUS RADIATION THERAPY: No  PAST MEDICAL HISTORY:  has a past medical history of Anemia; Breast cancer ([place]); Diabetes mellitus without complication ([place]); Family history of breast cancer; Family history of ovarian cancer; Family history of prostate cancer; Fibroids; Headache; Heart murmur; Hyperlipidemia; and Hypertension.    PAST SURGICAL HISTORY: Past Surgical History:  Procedure Laterality Date  . BREATH TEK H PYLORI N/A 04/17/2014   Procedure: BREATH TEK H PYLORI;  Surgeon: Shann Medal, MD;  Location: [facility];  Service: General;  Laterality: N/A;  . CESAREAN SECTION     x3- normal development  . ESOPHAGOGASTRODUODENOSCOPY N/A 08/20/2015   Procedure: ESOPHAGOGASTRODUODENOSCOPY (EGD);  Surgeon: Alphonsa Overall, MD;  Location: [facility];  Service: General;  Laterality: N/A;  . EXPLORATORY LAPAROTOMY  05/13/2015  . GASTRIC ROUX-EN-Y N/A 10/06/2014   Procedure: LAPAROSCOPIC ROUX-EN-Y GASTRIC BYPASS WITH UPPER ENDOSCOPY;  Surgeon: Rodman Key  Verdie Drown, MD;  Location: [facility];  Service: General;  Laterality: N/A;  . LAPAROSCOPY N/A 05/12/2015   Procedure: LAPAROSCOPY REPAIR OF PERFORATED BOWEL;  Surgeon: Alphonsa Overall, MD;  Location: [facility];  Service: General;   Laterality: N/A;  . UTERINE ARTERY EMBOLIZATION  08-16-2010    FAMILY HISTORY: family history includes Breast cancer (age of onset: 1) in her paternal aunt; Breast cancer (age of onset: 79) in her cousin; Cancer in her other; Cancer (age of onset: 54) in her maternal aunt; Diabetes in her father and mother; Hypertension in her father and mother; Ovarian cancer (age of onset: 49) in her paternal grandmother; Prostate cancer (age of onset: 52) in her paternal uncle; Prostate cancer (age of onset: 54) in her father; Stroke in her mother.  SOCIAL HISTORY:  reports that she has never smoked. She has never used smokeless tobacco. She reports that she drinks about 0.6 oz of alcohol per week . She reports that she does not use drugs.  ALLERGIES: Patient has no known allergies.  MEDICATIONS:  Current Outpatient Prescriptions  Medication Sig Dispense Refill  . acetaminophen (TYLENOL) 325 MG tablet Take 650 mg by mouth as needed.    Marland Kitchen amLODipine (NORVASC) 10 MG tablet Take 10 mg by mouth every morning.     Marland Kitchen atorvastatin (LIPITOR) 80 MG tablet Take 80 mg by mouth every morning.     . Calcium Carbonate-Vitamin D (CALCIUM 500/D PO) Take 2 tablets by mouth daily.    . Cholecalciferol (VITAMIN D3 PO) Take by mouth.    . Cyanocobalamin (VITAMIN B-12 PO) Take 1 tablet by mouth every morning.    Marland Kitchen dexamethasone (DECADRON) 4 MG tablet Take 2 tablets by mouth once a day on the day after chemotherapy and then take 2 tablets two times a day for 2 days. Take with food. 30 tablet 1  . lidocaine-prilocaine (EMLA) cream Apply to affected area once 30 g 3  . lisinopril-hydrochlorothiazide (PRINZIDE,ZESTORETIC) 20-12.5 MG per tablet Take 2 tablets by mouth daily.     Marland Kitchen LORazepam (ATIVAN) 0.5 MG tablet Take 1 tablet (0.5 mg total) by mouth every 6 (six) hours as needed (Nausea or vomiting). 30 tablet 0  . Melatonin 10 MG CAPS Take 1 tablet by mouth at bedtime.    . metoprolol succinate (TOPROL-XL) 25 MG 24 hr tablet  Take 25 mg by mouth every morning.     . ondansetron (ZOFRAN) 8 MG tablet Take 1 tablet (8 mg total) by mouth 2 (two) times daily as needed. Start on the third day after chemotherapy. 30 tablet 1  . pantoprazole (PROTONIX) 40 MG tablet Take 40 mg by mouth daily.    . prochlorperazine (COMPAZINE) 10 MG tablet TAKE 1 TABLET(10 MG) BY MOUTH EVERY 6 HOURS AS NEEDED FOR NAUSEA OR VOMITING 385 tablet 1  . sitaGLIPtin-metformin (JANUMET) 50-500 MG per tablet Take 1 tablet by mouth 2 (two) times daily with a meal.      No current facility-administered medications for this encounter.     REVIEW OF SYSTEMS: A 10+ POINT REVIEW OF SYSTEMS WAS OBTAINED including neurology, dermatology, psychiatry, cardiac, respiratory, lymph, extremities, GI, GU, Musculoskeletal, constitutional, breasts, reproductive, HEENT.  All pertinent positives are noted in the HPI.  All others are negative.   PHYSICAL EXAM:  Vital signs were not transferred over, vitals signs for the visit of 07/26/17 are as follows:  BP: 145/84, HR: 92, Resp: 18, Temp: 98.32F (oral), SpO2: 99%, Weight: 178lbs (81kg), Height: 5'1" General: Alert and  oriented, in no acute distress HEENT: Head is normocephalic. Extraocular movements are intact. Oropharynx is clear. Neck: Neck is supple, no palpable cervical or supraclavicular lymphadenopathy. Heart: Regular in rate and rhythm with no murmurs, rubs, or gallops. Chest: Clear to auscultation bilaterally, with no rhonchi, wheezes, or rales. Abdomen: Soft, nontender, nondistended, with no rigidity or guarding. Extremities: No cyanosis or edema. Lymphatics: see Neck Exam Skin: No concerning lesions. Musculoskeletal: symmetric strength and muscle tone throughout. Neurologic: Cranial nerves II through XII are grossly intact. No obvious focalities. Speech is fluent. Coordination is intact. Psychiatric: Judgment and insight are intact. Affect is appropriate. Breasts: Palpable lump in the right axillae that is  about 2cm in greatest dimension. At the 10 o'clock position of the right breast there is another palpable lump that is about 2cm in greatest dimension. No other palpable masses appreciated in the breasts or axillae on the left.  ECOG = 0  0 - Asymptomatic (Fully active, able to carry on all predisease activities without restriction)  1 - Symptomatic but completely ambulatory (Restricted in physically strenuous activity but ambulatory and able to carry out work of a light or sedentary nature. For example, light housework, office work)  2 - Symptomatic, <50% in bed during the day (Ambulatory and capable of all self care but unable to carry out any work activities. Up and about more than 50% of waking hours)  3 - Symptomatic, >50% in bed, but not bedbound (Capable of only limited self-care, confined to bed or chair 50% or more of waking hours)  4 - Bedbound (Completely disabled. Cannot carry on any self-care. Totally confined to bed or chair)  5 - Death   Eustace Pen MM, Creech RH, Tormey DC, et al. [phone]). "Toxicity and response criteria of the [facility]". [place] Oncol. 5 (6): 649-55   LABORATORY DATA:  Lab Results  Component Value Date   WBC 5.1 07/26/2017   HGB 13.4 07/26/2017   HCT 39.4 07/26/2017   MCV 92.2 07/26/2017   PLT 321 07/26/2017   CMP     Component Value Date/Time   NA 143 07/26/2017 0844   K 3.7 07/26/2017 0844   CL 102 05/15/2015 0407   CO2 31 (H) 07/26/2017 0844   GLUCOSE 102 07/26/2017 0844   BUN 15.6 07/26/2017 0844   CREATININE 1.0 07/26/2017 0844   CALCIUM 10.9 (H) 07/26/2017 0844   PROT 8.7 (H) 07/26/2017 0844   ALBUMIN 4.7 07/26/2017 0844   AST 20 07/26/2017 0844   ALT 20 07/26/2017 0844   ALKPHOS 61 07/26/2017 0844   BILITOT 0.65 07/26/2017 0844   GFRNONAA >60 05/15/2015 0407   GFRAA >60 05/15/2015 0407         RADIOGRAPHY: Dg Shoulder Right  Result Date: 07/26/2017 CLINICAL DATA:  51 year old female with diffuse right  shoulder pain for 3 weeks. Newly diagnosed right breast cancer. EXAM: RIGHT SHOULDER - 2+ VIEW COMPARISON:  None. FINDINGS: There is no evidence of fracture or dislocation. There is no evidence of arthropathy or other focal bone abnormality. Soft tissues are unremarkable. IMPRESSION: Negative. Electronically Signed   By: Kristopher Oppenheim M.D.   On: 07/26/2017 15:42      IMPRESSION/PLAN: This is a wonderful 51 y.o. female who presents today to discuss management of her triple negative invasive ductal carcinoma of the right breast.  She has been discussed at our multidisciplinary tumor board.  The consensus is that she would be a good candidate for breast conservation. I talked to  her about the option of a mastectomy and informed her that her expected overall survival would be equivalent between mastectomy and breast conservation, based upon randomized controlled data. She is enthusiastic about breast conservation.  It was a pleasure meeting the patient today. We discussed the risks, benefits, and side effects of radiotherapy. I recommend radiotherapy to the right breast and regional nodes to reduce her risk of locoregional recurrence by 2/3.  We discussed that radiation would take approximately 6 weeks to complete and that I would give the patient a few weeks to heal following surgery before starting treatment planning. Medical oncology would like to begin with breast MRI and neoadjuvant chemotherapy. Following this she will possibly undergo lumpectomy with surgery.   We spoke about acute effects including skin irritation and fatigue as well as much less common late effects including internal organ injury or irritation. We spoke about the latest technology that is used to minimize the risk of late effects for patients undergoing radiotherapy to the breast or chest wall. No guarantees of treatment were given. The patient is enthusiastic about proceeding with treatment. I look forward to participating in the  patient's care.  I will await her referral back to me for postoperative follow-up and eventual CT simulation/treatment planning.  We will also refer her to genetics for genetic testing.    __________________________________________   Eppie Gibson, MD  This document serves as a record of services personally performed by Eppie Gibson, MD. It was created on his behalf by Reola Mosher, a trained medical scribe. The creation of this record is based on the scribe's personal observations and the provider's statements to them. This document has been checked and approved by the attending provider.

## 2017-07-26 NOTE — Progress Notes (Signed)
START ON PATHWAY REGIMEN - Breast   Doxorubicin + Cyclophosphamide (AC):   A cycle is every 21 days:     Doxorubicin      Cyclophosphamide   **Always confirm dose/schedule in your pharmacy ordering system**    Paclitaxel 80 mg/m2 Weekly:   Administer weekly:     Paclitaxel   **Always confirm dose/schedule in your pharmacy ordering system**    Patient Characteristics: Preoperative or Nonsurgical Candidate (Clinical Staging), Neoadjuvant Therapy followed by Surgery, Invasive Disease, Chemotherapy, HER2 Negative/Unknown/Equivocal, ER Negative/Unknown, Platinum Therapy Not Indicated Therapeutic Status: Preoperative or Nonsurgical Candidate (Clinical Staging) AJCC M Category: cM0 AJCC Grade: G3 Breast Surgical Plan: Neoadjuvant Therapy followed by Surgery ER Status: Negative (-) AJCC 8 Stage Grouping: IIB HER2 Status: Negative (-) AJCC T Category: cT1b AJCC N Category: cN1 PR Status: Negative (-) Type of Therapy: Platinum Therapy Not Indicated Intent of Therapy: Curative Intent, Discussed with Patient

## 2017-07-26 NOTE — Therapy (Signed)
[facility] [address] [place], [place], [address] Phone: [phone]   Fax:  [phone]  Physical Therapy Evaluation  Patient Details  Name: Kara Crawford MRN: [id number] Date of Birth: 01/24/1966 Referring Provider: Dr. David Newman  Encounter Date: 07/26/2017      PT End of Session - 07/26/17 1019    Visit Number 1   Number of Visits 8   Date for PT Re-Evaluation 08/23/17  Will also see pt for 3-4 post op visit in several months   PT Start Time 1024   PT Stop Time 1100   PT Time Calculation (min) 36 min   Activity Tolerance Patient tolerated treatment well   Behavior During Therapy WFL for tasks assessed/performed      Past Medical History:  Diagnosis Date  . Anemia    anemia prior to uterine emboliztion procedure.  . Diabetes mellitus without complication (HCC)    type 2  . Fibroids   . Headache   . Heart murmur   . Hyperlipidemia   . Hypertension   . Pneumonia    hx of pna    Past Surgical History:  Procedure Laterality Date  . BREATH TEK H PYLORI N/A 04/17/2014   Procedure: BREATH TEK H PYLORI;  Surgeon: David H Newman, MD;  Location: [facility];  Service: General;  Laterality: N/A;  . CESAREAN SECTION     x3- normal development  . ESOPHAGOGASTRODUODENOSCOPY N/A 08/20/2015   Procedure: ESOPHAGOGASTRODUODENOSCOPY (EGD);  Surgeon: David Newman, MD;  Location: [facility];  Service: General;  Laterality: N/A;  . EXPLORATORY LAPAROTOMY  05/13/2015  . GASTRIC ROUX-EN-Y N/A 10/06/2014   Procedure: LAPAROSCOPIC ROUX-EN-Y GASTRIC BYPASS WITH UPPER ENDOSCOPY;  Surgeon: Matthew B Martin, MD;  Location: [facility];  Service: General;  Laterality: N/A;  . LAPAROSCOPY N/A 05/12/2015   Procedure: LAPAROSCOPY REPAIR OF PERFORATED BOWEL;  Surgeon: David Newman, MD;  Location: [facility];  Service: General;  Laterality: N/A;  . UTERINE ARTERY EMBOLIZATION  08-16-2010    There were no vitals filed for this visit.        Subjective Assessment - 07/26/17 1011    Subjective Patient reports she is here today to be seen by her medical team for her newly diagnosed right breast cancer. She also reports right shoulder pain for the past 3-4 weeks with insidious onset.   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 It measures 1 cm and is located in the upper outer quadrant with a Ki67 of 90%. She had 3 abnormal looking axillary nodes on ultrasound and 1 was biopsied and found to be positive. Her right shoulder pain has been present for 3-4 weeks for unknown reasons. She had a cortisone injection in her shoulder which she reports did not help her pain.   Patient Stated Goals reduce lymphedema risk and learn post op shoulder ROM HEP; reduce shoulder pain   Currently in Pain? Yes   Pain Score 8    Pain Location Shoulder   Pain Orientation Right   Pain Descriptors / Indicators Sharp   Pain Type Acute pain   Pain Onset 1 to 4 weeks ago   Pain Frequency Intermittent   Aggravating Factors  Laying on right side, abduction   Pain Relieving Factors Unknown   Multiple Pain Sites No            [facility] PT Assessment - 07/26/17 0001      Assessment     Medical Diagnosis Right breast cancer   Referring Provider Dr. David Newman   Onset Date/Surgical Date 07/13/17   Hand Dominance Right   Prior Therapy none     Precautions   Precautions Other (comment)   Precaution Comments Active cancer     Restrictions   Weight Bearing Restrictions No     Balance Screen   Has the patient fallen in the past 6 months No   Has the patient had a decrease in activity level because of a fear of falling?  No   Is the patient reluctant to leave their home because of a fear of falling?  No     Home Environment   Living Environment Private residence   Living Arrangements Spouse/significant other;Children  Husband and 16, 19, 28  y.o. kids   Available Help  at Discharge Family     Prior Function   Level of Independence Independent   Vocation Unemployed   Leisure Does treadmill or walks outside 45 minutes 5x/week and does weights some     Cognition   Overall Cognitive Status Within Functional Limits for tasks assessed     Posture/Postural Control   Posture/Postural Control Postural limitations   Postural Limitations Rounded Shoulders;Forward head     ROM / Strength   AROM / PROM / Strength AROM;Strength     AROM   Overall AROM Comments Right shoulder pain with all ROM except extension    AROM Assessment Site Shoulder;Cervical   Right/Left Shoulder Right;Left   Right Shoulder Extension 50 Degrees   Right Shoulder Flexion 133 Degrees   Right Shoulder ABduction 114 Degrees   Right Shoulder Internal Rotation 47 Degrees   Right Shoulder External Rotation 73 Degrees   Left Shoulder Extension 56 Degrees   Left Shoulder Flexion 144 Degrees   Left Shoulder ABduction 166 Degrees   Left Shoulder Internal Rotation 70 Degrees   Left Shoulder External Rotation 77 Degrees   Cervical Flexion WNL   Cervical Extension WNL   Cervical - Right Side Bend WNL   Cervical - Left Side Bend WNL   Cervical - Right Rotation WNl   Cervical - Left Rotation WNL     Strength   Overall Strength Within functional limits for tasks performed     Palpation   Palpation comment Tender to palpation right posterior shoulder     Special Tests    Special Tests Rotator Cuff Impingement   Rotator Cuff Impingment tests Empty Can test;Painful Arc of Motion;Drop Arm test     Empty Can test   Findings Negative   Side Right     Drop Arm test   Findings Negative   Side Right     Painful Arc of Motion   Findings Negative   Side Right           LYMPHEDEMA/ONCOLOGY QUESTIONNAIRE - 07/26/17 1018      Type   Cancer Type Right breast cancer     Lymphedema Assessments   Lymphedema Assessments Upper extremities     Right Upper Extremity Lymphedema   10 cm  Proximal to Olecranon Process 30.3 cm   Olecranon Process 25.1 cm   10 cm Proximal to Ulnar Styloid Process 24.2 cm   Just Proximal to Ulnar Styloid Process 16.5 cm   Across Hand at Thumb Web Space 19.3 cm   At Base of 2nd Digit 6.3 cm     Left Upper Extremity Lymphedema   10 cm Proximal to Olecranon Process 30.5 cm   Olecranon   Process 24.9 cm   10 cm Proximal to Ulnar Styloid Process 23.5 cm   Just Proximal to Ulnar Styloid Process 16.5 cm   Across Hand at Thumb Web Space 18.5 cm   At Base of 2nd Digit 6.2 cm         Objective measurements completed on examination: See above findings.    Patient was instructed today in a home exercise program today for post op shoulder range of motion. These included active assist shoulder flexion in sitting, scapular retraction, wall walking with shoulder abduction, and hands behind head external rotation.  She was encouraged to do these twice a day, holding 3 seconds and repeating 5 times when permitted by her physician.         PT Education - 07/26/17 1018    Education provided Yes   Education Details Lymphedema risk reduction and post op shoulder ROM HEP   Person(s) Educated Patient   Methods Explanation;Demonstration;Handout   Comprehension Returned demonstration;Verbalized understanding              Breast Clinic Goals - 07/26/17 1125      Patient will be able to verbalize understanding of pertinent lymphedema risk reduction practices relevant to her diagnosis specifically related to skin care.   Time 1   Period Days   Status Achieved     Patient will be able to return demonstrate and/or verbalize understanding of the post-op home exercise program related to regaining shoulder range of motion.   Time 1   Period Days   Status Achieved     Patient will be able to verbalize understanding of the importance of attending the postoperative After Breast Cancer Class for further lymphedema risk reduction education and therapeutic  exercise.   Time 1   Period Days   Status Achieved          Long Term Clinic Goals - 07/26/17 1126      CC Long Term Goal  #1   Title Patient will be independent in a home exercise program to improve shoulder ROM.   Time 4   Period Weeks   Status New     CC Long Term Goal  #2   Title Increase right shoulder active flexion to >/= 144 degrees for increased ease of reaching.   Time 4   Period Weeks   Status New     CC Long Term Goal  #3   Title Increase right shoulder abduction to >/= 160 degrees for increased ease of reaching and to obtain required radiation positioning.   Time 4   Period Weeks   Status New     CC Long Term Goal  #4   Title Patient will report >/= 50% decrease in right shoulder pain to tolerate daily tasks with greater ease.   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 It measures 1 cm and is located in the upper outer quadrant with a Ki67 of 90%. She had 3 abnormal looking axillary nodes on ultrasound and 1 was biopsied and found to be positive. Her multidisciplinary medical team met prior to her assessments to determine a recommended treatment plan. She is planning to have neoadjuvant chemotherapy followed by a right lumpectomy and targeted axillary node dissection, radiation, and genetic testing. Her   right shoulder pain may benefit from physical therapy now to reduce pain and restore ROM prior to surgery. She is scheduled for a right shoulder x-ray to r/o any concerning pathology. If that is clear, she will benefit from PT.   History and Personal Factors relevant to plan of care: Right shoulder pain; new diagnosis of aggressive breast cancer   Clinical Presentation Evolving   Clinical Presentation due to: Unknown shoulder pathology; has not yet begun chemotherapy   Clinical Decision  Making Moderate   Rehab Potential Good   Clinical Impairments Affecting Rehab Potential Cancer diagnosis   PT Frequency 3x / week   PT Duration 4 weeks   PT Treatment/Interventions Patient/family education;Passive range of motion;Manual techniques;Dry needling;Taping;Therapeutic exercise;Therapeutic activities;ADLs/Self Care Home Management;Moist Heat;Cryotherapy;Iontophoresis 45m/ml Dexamethasone   PT Next Visit Plan PROM, soft tissue work, mobs to right shoulder, dry needling, ROM exercises   PT Home Exercise Plan Post op shoulder ROM HEP   Consulted and Agree with Plan of Care Patient;Family member/caregiver   Family Member Consulted Husband, sisters, mother      Patient will benefit from skilled therapeutic intervention in order to improve the following deficits and impairments:  Decreased range of motion, Impaired UE functional use, Pain, Decreased knowledge of precautions, Postural dysfunction  Visit Diagnosis: Carcinoma of upper-outer quadrant of right breast in female, estrogen receptor negative ([facility] - Plan: PT plan of care cert/re-cert  Abnormal posture - Plan: PT plan of care cert/re-cert  Acute pain of right shoulder - Plan: PT plan of care cert/re-cert  Stiffness of right shoulder, not elsewhere classified - Plan: PT plan of care cert/re-cert   Patient will follow up at outpatient cancer rehab if needed following surgery.  If the patient requires physical therapy at that time, a specific plan will be dictated and sent to the referring physician for approval. The patient was educated today on appropriate basic range of motion exercises to begin post operatively and the importance of attending the After Breast Cancer class following surgery.  Patient was educated today on lymphedema risk reduction practices as it pertains to recommendations that will benefit the patient immediately following surgery.  She verbalized good understanding.     Problem List Patient Active Problem  List   Diagnosis Date Noted  . Malignant neoplasm of upper-outer quadrant of right breast in female, estrogen receptor negative ([facility] 07/25/2017  . Perforated bowel ([facility] 05/13/2015  . Free intraperitoneal air 05/12/2015  . Submucosal lesion of esophagus 10/06/2014  . 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 Diabetes ([facility] 09/24/2014  . Morbid obesity ([facility] 03/20/2014    MAnnia Friendly PT 07/26/17 11:30 AM  [place] [address]Phone: [phone]  Fax:  [phone] Name: PChelli YerkesMRN: [id number] of Birth: 802-Aug-1967

## 2017-07-26 NOTE — Patient Instructions (Signed)

## 2017-07-26 NOTE — Assessment & Plan Note (Signed)
07/17/2017: Right upper outer quadrant painful palpable right breast mass by ultrasound measured 1 cm with 3 abnormal axillary lymph nodes 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, Ki-67 90%, lymph node biopsy also positive, T1bN1 stage IIB AJCC 8  Pathology and radiology counseling: Discussed with the patient, the details of pathology including the type of breast cancer,the clinical staging, the significance of ER, PR and HER-2/neu receptors and the implications for treatment. After reviewing the pathology in detail, we proceeded to discuss the different treatment options between surgery, radiation, chemotherapy, antiestrogen therapies.  Pathology and radiology counseling: Discussed with the patient, the details of pathology including the type of breast cancer,the clinical staging, the significance of ER, PR and HER-2/neu receptors and the implications for treatment. After reviewing the pathology in detail, we proceeded to discuss the different treatment options between surgery, radiation, chemotherapy, antiestrogen therapies.  Recommendation based on multidisciplinary tumor board: 1. Neoadjuvant chemotherapy with Adriamycin and Cytoxan dose dense 4 followed by Taxol weekly 12 2. Followed by breast conserving surgery with sentinel lymph node study + targeted axillary dissection 3. Followed by adjuvant radiation therapy  Chemotherapy Counseling: I discussed the risks and benefits of chemotherapy including the risks of nausea/ vomiting, risk of infection from low WBC count, fatigue due to chemo or anemia, bruising or bleeding due to low platelets, mouth sores, loss/ change in taste and decreased appetite. Liver and kidney function will be monitored through out chemotherapy as abnormalities in liver and kidney function may be a side effect of treatment. Cardiac dysfunction due to Adriamycin was discussed in detail. Risk of permanent bone marrow dysfunction due to chemo  were also discussed.  Plan: 1. Port placement to be done next Monday 2. Echocardiogram 3. Chemotherapy class 4. Breast MRI 5. Genetic counseling will also be arranged  Return to clinic in 1-2 weeks to start chemotherapy.

## 2017-07-26 NOTE — Progress Notes (Signed)
Nutrition Assessment  Reason for Assessment:  Pt seen in Breast Clinic  ASSESSMENT:   51 year old female with new diagnosis of breast cancer.  Past medical history of RYGB, bowel perforation after surgery.   Medications:  Vit d3, Vit B 12, Calcium  Labs: reviewed  Anthropometrics:   Height: 178 lb 9.6 oz Weight: 61 inches BMI: 33  Noted goal weight for weight loss surgery was 150 lb.     NUTRITION DIAGNOSIS: Food and nutrition related knowledge deficit related to new diagnosis of breast cancer as evidenced by no prior need for nutrition related information.  INTERVENTION:   Recommend bariatric specific MVI daily and calcium TID for life following RYGB.  Once patient starts bariatric specific MVI can stop extra vitamin D and B 12.  Discussed with patient and written information given to patient. Spoke with Dr. Lindi Adie and supplements ok to continue during chemotherapy.   Discussed and provided packet of information regarding nutritional tips for breast cancer patients.  Questions answered.  Teachback method used.  Contact information provided and patient knows to contact me with questions/concerns.    MONITORING, EVALUATION, and GOAL: Pt will consume a healthy plant based diet to maintain lean body mass throughout treatment.   Abigail Teall B. Zenia Resides, [place], [place] Registered Dietitian [phone] (pager)

## 2017-07-27 ENCOUNTER — Encounter: Payer: Self-pay | Admitting: *Deleted

## 2017-07-27 ENCOUNTER — Encounter (HOSPITAL_BASED_OUTPATIENT_CLINIC_OR_DEPARTMENT_OTHER): Payer: Self-pay | Admitting: *Deleted

## 2017-07-27 ENCOUNTER — Ambulatory Visit (HOSPITAL_BASED_OUTPATIENT_CLINIC_OR_DEPARTMENT_OTHER): Payer: 59 | Admitting: Genetics

## 2017-07-27 ENCOUNTER — Encounter: Payer: Self-pay | Admitting: Genetics

## 2017-07-27 ENCOUNTER — Other Ambulatory Visit: Payer: 59

## 2017-07-27 ENCOUNTER — Ambulatory Visit (HOSPITAL_COMMUNITY)
Admission: RE | Admit: 2017-07-27 | Discharge: 2017-07-27 | Disposition: A | Discharge status: Home / Self Care | Payer: 59 | Source: Ambulatory Visit | Attending: Hematology and Oncology | Admitting: Hematology and Oncology

## 2017-07-27 DIAGNOSIS — E785 Hyperlipidemia, unspecified: Secondary | ICD-10-CM | POA: Insufficient documentation

## 2017-07-27 DIAGNOSIS — C50411 Malignant neoplasm of upper-outer quadrant of right female breast: Secondary | ICD-10-CM | POA: Insufficient documentation

## 2017-07-27 DIAGNOSIS — I119 Hypertensive heart disease without heart failure: Secondary | ICD-10-CM | POA: Insufficient documentation

## 2017-07-27 DIAGNOSIS — D649 Anemia, unspecified: Secondary | ICD-10-CM | POA: Diagnosis not present

## 2017-07-27 DIAGNOSIS — Z171 Estrogen receptor negative status [ER-]: Secondary | ICD-10-CM | POA: Diagnosis not present

## 2017-07-27 DIAGNOSIS — Z803 Family history of malignant neoplasm of breast: Secondary | ICD-10-CM | POA: Insufficient documentation

## 2017-07-27 DIAGNOSIS — Z8042 Family history of malignant neoplasm of prostate: Secondary | ICD-10-CM | POA: Insufficient documentation

## 2017-07-27 DIAGNOSIS — Z01810 Encounter for preprocedural cardiovascular examination: Secondary | ICD-10-CM | POA: Insufficient documentation

## 2017-07-27 DIAGNOSIS — Z8041 Family history of malignant neoplasm of ovary: Secondary | ICD-10-CM | POA: Diagnosis not present

## 2017-07-27 DIAGNOSIS — R011 Cardiac murmur, unspecified: Secondary | ICD-10-CM | POA: Diagnosis not present

## 2017-07-27 NOTE — Progress Notes (Signed)
  Echocardiogram 2D Echocardiogram has been performed.  Darlina Sicilian M 07/27/2017, 11:04 AM

## 2017-07-27 NOTE — Progress Notes (Signed)
REFERRING PROVIDER: Nicholas Lose, MD [place], [place] [address]  PRIMARY PROVIDER:  Cari Caraway, MD  PRIMARY REASON FOR VISIT:  1. Malignant neoplasm of upper-outer quadrant of right breast in female, estrogen receptor negative ([place])   2. Family history of breast cancer   3. Family history of ovarian cancer   4. Family history of prostate cancer      HISTORY OF PRESENT ILLNESS:   Kara Crawford, a 51 y.o. female, was seen for a [place] cancer genetics consultation at the request of Dr. Lindi Adie due to a personal and family history of cancer.  Kara Crawford presents to clinic today to discuss the possibility of a hereditary predisposition to cancer, genetic testing, and to further clarify her future cancer risks, as well as potential cancer risks for family members.   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 She will be undergoing neoadjuvant chemotherapy followed by breast surgery. She is considering surgical options at this time.   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  First live birth at age 62.  OCP use for approximately 1.5-2 years.  Ovaries intact: yes.  Hysterectomy: no.  Menopausal status: started 2017 HRT use: 0 years. Colonoscopy: yes; reportedly normal, told to repeat in 6 years. Mammogram within the last year: yes.   Past Medical History:  Diagnosis Date  . Anemia    anemia prior to uterine emboliztion  procedure.  . Breast cancer ([place])   . Diabetes mellitus without complication ([place])    type 2  . Family history of breast cancer   . Family history of ovarian cancer   . Family history of prostate cancer   . Fibroids   . Headache   . Heart murmur   . Hyperlipidemia   . Hypertension     Past Surgical History:  Procedure Laterality Date  . BREATH TEK H PYLORI N/A 04/17/2014   Procedure: BREATH TEK H PYLORI;  Surgeon: Shann Medal, MD;  Location: [facility];  Service: General;  Laterality: N/A;  . CESAREAN SECTION     x3- normal development  . ESOPHAGOGASTRODUODENOSCOPY N/A 08/20/2015   Procedure: ESOPHAGOGASTRODUODENOSCOPY (EGD);  Surgeon: Alphonsa Overall, MD;  Location: [facility];  Service: General;  Laterality: N/A;  . EXPLORATORY LAPAROTOMY  05/13/2015  . GASTRIC ROUX-EN-Y N/A 10/06/2014   Procedure: LAPAROSCOPIC ROUX-EN-Y GASTRIC BYPASS WITH UPPER ENDOSCOPY;  Surgeon: Pedro Earls, MD;  Location: [facility];  Service: General;  Laterality: N/A;  . LAPAROSCOPY N/A 05/12/2015   Procedure: LAPAROSCOPY REPAIR OF PERFORATED BOWEL;  Surgeon: Alphonsa Overall, MD;  Location: [facility];  Service: General;  Laterality: N/A;  . UTERINE ARTERY EMBOLIZATION  08-16-2010    Social History   Social History  . Marital status: Married    Spouse name: N/A  . Number of children: N/A  . Years of education: N/A   Social History Main Topics  . Smoking status: Never Smoker  . Smokeless  tobacco: Never Used  . Alcohol use 0.6 oz/week    1 Glasses of wine per week     Comment: social -rare occ.  . Drug use: No  . Sexual activity: Yes   Other Topics Concern  . None   Social History Narrative  . None     FAMILY HISTORY:  We obtained a detailed, 4-generation family history.  Significant diagnoses are listed below: Family History  Problem Relation Age of Onset  . Diabetes Mother   . Hypertension Mother   . Stroke Mother   . Diabetes Father   . Hypertension Father   . Prostate cancer Father  85       found at stage II  . Prostate cancer Paternal Uncle 37  . Breast cancer Paternal Aunt 76  . Cancer Maternal Aunt 55       type unknown  . Ovarian cancer Paternal Grandmother 64  . Cancer Other        type unk- dx in 40's  . Breast cancer Cousin 65   Kara Crawford has a 69 year-old daughter, a 96 year-old daughter, and a 55 year-old son with no history of cancer.  Kara Crawford has a 29 year-old sister with no history of cancer.  This sister has a son and a daughter with no history of cancer. Kara Crawford also has a brother who died at 24 due to an accident. He had a son who has no history of cancer.  Kara Crawford also has a 51 year-old paternal half-sister with no children or any history of cancer.   Kara Crawford' father was recently diagnosed with prostate cancer at 20.  She reports that it was stage 2.  Kara Crawford has 4 paternal aunts and 1 paternal uncle listed below: -1 paternal aunt was diagnosed with breast cancer at 45 and is now 17.  She has 1 daughter, but her health information is unknown.  -1 paternal uncle was diagnosed with prostate cancer at 71.  He has 4 children with no known history of cancer.  -3 paternal aunts are in th eir 88's and 24's without any history of cancer.  Kara Crawford has several other paternal cousins, none with any known history of cancer. Kara Crawford' paternal grandfather is 61 with no history of cancer.  Kara Crawford' paternal grandmother died of ovarian cancer in her 109's.  This grandmother had a sister with cancer in her 68's, but the type is unknown.   Kara Crawford' mother is 33 with no history of cancer.  Kara Crawford has 26 maternal aunts and uncles.  One of these aunts was diagnosed with an unknown type of cancer in her 56's.  One maternal cousin was diagnosed with breast cancer in her 69's and is now 79.  Kara Crawford' maternal grandparents died in their 58's/80's with no history of cancer.    Kara Crawford is unaware of previous family history of genetic testing for  hereditary cancer risks. Patient's maternal ancestors are of African American descent, and paternal ancestors are of African American descent. There is no reported Ashkenazi Jewish ancestry. There is no known consanguinity.  Kara Crawford does report that her father and his sister married her mother and her mother's brother. Her parents are not related to each other, but she does have a double cousin' as a result.   GENETIC COUNSELING ASSESSMENT: Leotha Westermeyer is a 51 y.o. female with a personal and family history which is somewhat suggestive of a Hereditary Cancer Predisposition Syndrome.  We, therefore, discussed and recommended the following at today's visit.   DISCUSSION: We reviewed the characteristics, features and inheritance patterns of hereditary cancer syndromes. We also discussed genetic testing, including the appropriate family members to test, the process of testing, insurance coverage and turn-around-time for results. We discussed the implications of a negative, positive and/or variant of uncertain significant result. We recommended Kara Crawford pursue genetic testing for the Common Hereditary Cancer gene panel. The Hereditary Gene Panel offered by [facility] includes sequencing and/or deletion duplication testing of the following 46 genes: APC, ATM, AXIN2, BARD1, BMPR1A, BRCA1, BRCA2, BRIP1, CDH1, CDKN2A (p14ARF), CDKN2A (p16INK4a), CHEK2, CTNNA1, DICER1, EPCAM (Deletion/duplication testing only), GREM1 (promoter region deletion/duplication testing only), KIT, MEN1, MLH1, MSH2, MSH3, MSH6, MUTYH, NBN, NF1, NHTL1, PALB2, PDGFRA, PMS2, POLD1, POLE, PTEN, RAD50, RAD51C, RAD51D, SDHB, SDHC, SDHD, SMAD4, SMARCA4. STK11, TP53, TSC1, TSC2, and VHL.  The following genes were evaluated for sequence changes only: SDHA and HOXB13 c.251G>A variant only.  We discussed that only 5-10% of cancers are associated with a Hereditary cancer predisposition syndrome.  One of the most common hereditary cancer syndromes that  increases breast cancer risk is called Hereditary Breast and Ovarian Cancer (HBOC) syndrome.  This syndrome is caused by mutations in the BRCA1 and BRCA2 genes.  This syndrome increases an individual's lifetime risk to develop breast, ovarian, pancreatic, and other types of cancer.  There are also many other cancer predisposition syndromes caused by mutations in several other genes.  We discussed that if she is found to have a mutation in one of these genes, it may impact surgical decisions, and alter future medical management recommendations such as increased cancer screenings and consideration of risk reducing surgeries.  A positive result could also have implications for the patient's family members.  A Negative result would mean we were unable to identify a hereditary component to her cancer, but does not rule out the possibility of a hereditary basis for her cancer.  There could be mutations that are undetectable by current technology, or in genes not yet tested or identified to increase cancer risk.    We discussed the potential to find a Variant of Uncertain Significance or VUS.  These are variants that have not yet been identified as pathogenic or benign, and it is unknown if this variant is associated with increased cancer risk or if this is a normal finding.  Most VUS's are reclassified to benign or likely benign.   It should not be used to make medical management decisions. With time, we suspect the lab will determine the significance of any VUS's identified if any.   Based on Ms. Lucia's personal and family history of cancer, she meets medical criteria for genetic testing. Despite that she meets criteria, she may still have an out of pocket cost. We discussed that if her out of pocket cost for testing is over $100, the laboratory will call and confirm whether she wants to proceed with testing.  If the out of pocket cost of testing is less than $100 she will be billed by the genetic testing  laboratory.   PLAN: After considering the risks, benefits, and limitations, Ms. Eastlick  provided informed consent to pursue genetic testing and the blood sample was sent to [facility] for analysis of the Common Hereditary Cancer panel. Results should be available within approximately 2-3 weeks' time, at which point they will be disclosed by telephone to Ms. Showman, as will any additional recommendations warranted by these results. Ms. Rothbauer will receive  a summary of her genetic counseling visit and a copy of her results once available. This information will also be available in Epic. We encouraged Ms. Teng to remain in contact with cancer genetics annually so that we can continuously update the family history and inform her of any changes in cancer genetics and testing that may be of benefit for her family. Ms. Higby questions were answered to her satisfaction today. Our contact information was provided should additional questions or concerns arise.  Based on Ms. Mccarron's family history, we recommended her other paternal relatives have genetic counseling and testing due to the family history of ovarian cancer.. Ms. Tatum will let us know if we can be of any assistance in coordinating genetic counseling and/or testing for this family member.   Lastly, we encouraged Ms. Epps to remain in contact with cancer genetics annually so that we can continuously update the family history and inform her of any changes in cancer genetics and testing that may be of benefit for this family.   Ms.  Chisholm questions were answered to her satisfaction today. Our contact information was provided should additional questions or concerns arise. Thank you for the referral and allowing [place] to share in the care of your patient.   Tana Felts, MS Genetic Counselor [email or web address] phone: [phone]  The patient was seen for a total of 40 minutes in face-to-face genetic counseling.  The  patient was accompanied today by her husband.  Pharmacist resident Virgilio Belling was also present and observed this session.  This patient was discussed with Drs. Magrinat, Lindi Adie and/or Burr Medico who agrees with the above.

## 2017-07-28 ENCOUNTER — Encounter: Payer: Self-pay | Admitting: Physical Therapy

## 2017-07-28 ENCOUNTER — Ambulatory Visit: Payer: 59 | Attending: Surgery | Admitting: Physical Therapy

## 2017-07-28 ENCOUNTER — Encounter: Payer: Self-pay | Admitting: General Practice

## 2017-07-28 ENCOUNTER — Telehealth: Payer: Self-pay

## 2017-07-28 DIAGNOSIS — C50411 Malignant neoplasm of upper-outer quadrant of right female breast: Secondary | ICD-10-CM

## 2017-07-28 DIAGNOSIS — M25611 Stiffness of right shoulder, not elsewhere classified: Secondary | ICD-10-CM | POA: Diagnosis present

## 2017-07-28 DIAGNOSIS — R293 Abnormal posture: Secondary | ICD-10-CM | POA: Diagnosis not present

## 2017-07-28 DIAGNOSIS — M25511 Pain in right shoulder: Secondary | ICD-10-CM

## 2017-07-28 DIAGNOSIS — Z171 Estrogen receptor negative status [ER-]: Secondary | ICD-10-CM | POA: Insufficient documentation

## 2017-07-28 NOTE — Patient Instructions (Signed)
  HEP given today:   1. Pendulum swings: When you are hurting, lean over a stable object and dangle your arm. You can make circles or let your arm swing forward and back. 1-2 min  2: Red band rows: do 10x in standing.  Practice relaxing your neck/shoulder muscle.  3: Rt neck stretch: Side bend Lt ear to LT shoulder: Breathe and relax 4x.  IONTOPHORESIS PATIENT PRECAUTIONS & CONTRAINDICATIONS:  . Redness under one or both electrodes can occur.  This characterized by a uniform redness that usually disappears within 12 hours of treatment. . Small pinhead size blisters may result in response to the drug.  Contact your physician if the problem persists more than 24 hours. . On rare occasions, iontophoresis therapy can result in temporary skin reactions such as rash, inflammation, irritation or burns.  The skin reactions may be the result of individual sensitivity to the ionic solution used, the condition of the skin at the start of treatment, reaction to the materials in the electrodes, allergies or sensitivity to dexamethasone, or a poor connection between the patch and your skin.  Discontinue using iontophoresis if you have any of these reactions and report to your therapist. . Remove the Patch or electrodes if you have any undue sensation of pain or burning during the treatment and report discomfort to your therapist. . Tell your Therapist if you have had known adverse reactions to the application of electrical current. . If using the Patch, the LED light will turn off when treatment is complete and the patch can be removed.  Approximate treatment time is 1-3 hours.  Remove the patch when light goes off or after 6 hours. . The Patch can be worn during normal activity, however excessive motion where the electrodes have been placed can cause poor contact between the skin and the electrode or uneven electrical current resulting in greater risk of skin irritation. Marland Kitchen Keep out of the reach of  children.   . DO NOT use if you have a cardiac pacemaker or any other electrically sensitive implanted device. . DO NOT use if you have a known sensitivity to dexamethasone. . DO NOT use during Magnetic Resonance Imaging (MRI). . DO NOT use over broken or compromised skin (e.g. sunburn, cuts, or acne) due to the increased risk of skin reaction. . DO NOT SHAVE over the area to be treated:  To establish good contact between the Patch and the skin, excessive hair may be clipped. . DO NOT place the Patch or electrodes on or over your eyes, directly over your heart, or brain. . DO NOT reuse the Patch or electrodes as this may cause burns to occur.

## 2017-07-28 NOTE — Progress Notes (Signed)
[place] Psychosocial Distress Screening [place] by phone following Breast Multidisciplinary Clinic to introduce [place] team/resources, reviewing distress screen per protocol.  The patient scored a 6 on the Psychosocial Distress Thermometer which indicates moderate distress. Also assessed for distress and other psychosocial needs.   ONCBCN DISTRESS SCREENING 07/28/2017  Screening Type Initial Screening  Distress experienced in past week (1-10) 6  Family Problem type Partner  Emotional problem type Nervousness/Anxiety;Adjusting to illness;Isolation/feeling alone;Feeling hopeless  Information Concerns Type Lack of info about diagnosis  Physical Problem type Sleep/insomnia  Referral to support programs Yes   Despite initial high distress, Kara Crawford was in good spirits during this f/u call and verbalized appreciation for the emotional support and ongoing [employer] team/programming availability.  She reports strong support from her family and her faith.  Follow up needed: No. Per pt, no other needs at this time, but she knows to contact Team at anytime with questions, concerns, interests, or other needs.   [place], [place], [facility] Pager [phone] Voicemail [phone]

## 2017-07-28 NOTE — Telephone Encounter (Signed)
Called and left a detailed message of upcoming appointment. Per 9/27 sch message

## 2017-07-28 NOTE — Therapy (Signed)
[facility] [address] [place], [place], [address] Phone: [phone]   Fax:  [phone]  Physical Therapy Treatment  Patient Details  Name: Kara Crawford MRN: [id number] Date of Birth: 01/25/1966 Referring Provider: Dr. Alphonsa Overall  Encounter Date: 07/28/2017      PT End of Session - 07/28/17 0939    Visit Number 2   Number of Visits 8   Date for PT Re-Evaluation 08/23/17   PT Start Time 0938  A litle late   PT Stop Time 1016   PT Time Calculation (min) 38 min   Activity Tolerance Patient tolerated treatment well   Behavior During Therapy [facility] for tasks assessed/performed      Past Medical History:  Diagnosis Date  . Anemia    anemia prior to uterine emboliztion procedure.  . Breast cancer ([place])   . Diabetes mellitus without complication ([place])    type 2  . Family history of breast cancer   . Family history of ovarian cancer   . Family history of prostate cancer   . Fibroids   . Headache   . Heart murmur   . Hyperlipidemia   . Hypertension     Past Surgical History:  Procedure Laterality Date  . BREATH TEK H PYLORI N/A 04/17/2014   Procedure: BREATH TEK H PYLORI;  Surgeon: Shann Medal, MD;  Location: [facility];  Service: General;  Laterality: N/A;  . CESAREAN SECTION     x3- normal development  . ESOPHAGOGASTRODUODENOSCOPY N/A 08/20/2015   Procedure: ESOPHAGOGASTRODUODENOSCOPY (EGD);  Surgeon: Alphonsa Overall, MD;  Location: [facility];  Service: General;  Laterality: N/A;  . EXPLORATORY LAPAROTOMY  05/13/2015  . GASTRIC ROUX-EN-Y N/A 10/06/2014   Procedure: LAPAROSCOPIC ROUX-EN-Y GASTRIC BYPASS WITH UPPER ENDOSCOPY;  Surgeon: Pedro Earls, MD;  [facility];  Service: General;  Laterality: N/A;  . LAPAROSCOPY N/A 05/12/2015   Procedure: LAPAROSCOPY REPAIR OF PERFORATED BOWEL;  Surgeon: Alphonsa Overall, MD;  Location: [facility];  Service: General;  Laterality: N/A;  . UTERINE ARTERY  EMBOLIZATION  08-16-2010    There were no vitals filed for this visit.      Subjective Assessment - 07/28/17 0941    Subjective I don't feel terrible this morning, shoulder is a little sore. She started her HEP yesterday.    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 It measures 1 cm and is located in the upper outer quadrant with a Ki67 of 90%. She had 3 abnormal looking axillary nodes on ultrasound and 1 was biopsied and found to be positive. Her right shoulder pain has been present for 3-4 weeks for unknown reasons. She had a cortisone injection in her shoulder which she reports did not help her pain.   Currently in Pain? Yes   Pain Score 5    Pain Location Shoulder   Pain Orientation Right   Pain Descriptors / Indicators Sore   Multiple Pain Sites No                         [facility] Adult PT Treatment/Exercise - 07/28/17 0001      Shoulder Exercises: Standing   Flexion --  Wall ladder 6x with PTA inhibiting the upper trap   Row Both;10 reps;Theraband   Theraband Level (Shoulder Row) --  Gave for HEP   Other Standing Exercises Taught pt pendulums for HEP  Shoulder Exercises: Pulleys   Flexion 2 minutes  Concurrent review of status   ABduction 2 minutes     Modalities   Modalities Iontophoresis     Iontophoresis   Type of Iontophoresis Dexamethasone  #1   Location Anterior Rt shoulder   Dose 1 ml   Time 6 hr wear  Pt verbalized understanding, skin intact     Manual Therapy   Manual Therapy Passive ROM   Passive ROM Attempted flexion: stopped due to pain: no end feel, just pain limiting     Neck Exercises: Stretches   Upper Trapezius Stretch 3 reps;30 seconds  VC for breathing   Other Neck Stretches Thera cane Tp release to Rt upper trap 3 sets of compression then Lt side bends 5x                PT Education - 07/28/17 1029    Education provided Yes    Education Details HEP progression, ionto information   Person(s) Educated Patient;Parent(s)   Methods Explanation;Demonstration;Tactile cues;Verbal cues;Handout   Comprehension Verbalized understanding;Returned demonstration;Verbal cues required;Tactile cues required              Breast Clinic Goals - 07/26/17 1125      Patient will be able to verbalize understanding of pertinent lymphedema risk reduction practices relevant to her diagnosis specifically related to skin care.   Time 1   Period Days   Status Achieved     Patient will be able to return demonstrate and/or verbalize understanding of the post-op home exercise program related to regaining shoulder range of motion.   Time 1   Period Days   Status Achieved     Patient will be able to verbalize understanding of the importance of attending the postoperative After Breast Cancer Class for further lymphedema risk reduction education and therapeutic exercise.   Time 1   Period Days   Status Achieved          Long Term Clinic Goals - 07/26/17 1126      CC Long Term Goal  #1   Title Patient will be independent in a home exercise program to improve shoulder ROM.   Time 4   Period Weeks   Status New     CC Long Term Goal  #2   Title Increase right shoulder active flexion to >/= 144 degrees for increased ease of reaching.   Time 4   Period Weeks   Status New     CC Long Term Goal  #3   Title Increase right shoulder abduction to >/= 160 degrees for increased ease of reaching and to obtain required radiation positioning.   Time 4   Period Weeks   Status New     CC Long Term Goal  #4   Title Patient will report >/= 50% decrease in right shoulder pain to tolerate daily tasks with greater ease.   Time 4   Period Weeks   Status New            Plan - 07/28/17 0940    Clinical Impression Statement Pt presented today with minimal to moderate pain. This pain did not limit any exercises except PROM. PROM was  attempted but pretty painful and described as sharp. We began ionto treatments today for this pain.  Pt demonstrated poor awarness with overactivation at the Rt upper trap during shoulder flexion. HEP was progressed some today.    Rehab Potential Good   Clinical Impairments Affecting Rehab Potential Cancer diagnosis  PT Frequency 3x / week   PT Duration 4 weeks   PT Treatment/Interventions Patient/family education;Passive range of motion;Manual techniques;Dry needling;Taping;Therapeutic exercise;Therapeutic activities;ADLs/Self Care Home Management;Moist Heat;Cryotherapy;Iontophoresis '4mg'$ /ml Dexamethasone   PT Next Visit Plan PROM, soft tissue work, mobs to right shoulder, dry needling, ROM exercises, ionto #2. Copy & paste the goals from note.   Consulted and Agree with Plan of Care --      Patient will benefit from skilled therapeutic intervention in order to improve the following deficits and impairments:  Decreased range of motion, Impaired UE functional use, Pain, Decreased knowledge of precautions, Postural dysfunction  Visit Diagnosis: Carcinoma of upper-outer quadrant of right breast in female, estrogen receptor negative ([facility])  Abnormal posture  Acute pain of right shoulder  Stiffness of right shoulder, not elsewhere classified     Problem List Patient Active Problem List   Diagnosis Date Noted  . Family history of breast cancer   . Family history of ovarian cancer   . Family history of prostate cancer   . Malignant neoplasm of upper-outer quadrant of right breast in female, estrogen receptor negative ([place]) 07/25/2017  . Perforated bowel ([place]) 05/13/2015  . Free intraperitoneal air 05/12/2015  . Submucosal lesion of esophagus 10/06/2014  . 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 Diabetes ([place]) 09/24/2014  . Morbid obesity ([place]) 03/20/2014    COCHRAN,JENNIFER, PTA 07/28/2017, 11:59 AM  [facility] [address] [place], [place], [address] Phone: [phone]   Fax:  [phone]  Name: Kara Crawford MRN: [id number] Date of Birth: [birth year]

## 2017-07-29 ENCOUNTER — Ambulatory Visit (HOSPITAL_COMMUNITY)
Admission: RE | Admit: 2017-07-29 | Discharge: 2017-07-29 | Disposition: A | Discharge status: Home / Self Care | Payer: 59 | Source: Ambulatory Visit | Attending: Hematology and Oncology | Admitting: Hematology and Oncology

## 2017-07-29 DIAGNOSIS — C50411 Malignant neoplasm of upper-outer quadrant of right female breast: Secondary | ICD-10-CM | POA: Diagnosis not present

## 2017-07-29 DIAGNOSIS — N6322 Unspecified lump in the left breast, upper inner quadrant: Secondary | ICD-10-CM | POA: Diagnosis not present

## 2017-07-29 DIAGNOSIS — Z171 Estrogen receptor negative status [ER-]: Secondary | ICD-10-CM | POA: Diagnosis not present

## 2017-07-29 DIAGNOSIS — R938 Abnormal findings on diagnostic imaging of other specified body structures: Secondary | ICD-10-CM | POA: Diagnosis not present

## 2017-07-29 DIAGNOSIS — C773 Secondary and unspecified malignant neoplasm of axilla and upper limb lymph nodes: Secondary | ICD-10-CM | POA: Insufficient documentation

## 2017-07-29 DIAGNOSIS — N6489 Other specified disorders of breast: Secondary | ICD-10-CM | POA: Diagnosis not present

## 2017-07-29 MED ORDER — GADOBENATE DIMEGLUMINE 529 MG/ML IV SOLN
16.0000 mL | Freq: Once | INTRAVENOUS | Status: AC | PRN
  Administered 2017-07-29: 16 mL via INTRAVENOUS

## 2017-07-31 ENCOUNTER — Ambulatory Visit: Payer: 59 | Attending: Surgery | Admitting: Physical Therapy

## 2017-07-31 ENCOUNTER — Telehealth: Payer: Self-pay | Admitting: *Deleted

## 2017-07-31 ENCOUNTER — Encounter (HOSPITAL_BASED_OUTPATIENT_CLINIC_OR_DEPARTMENT_OTHER)
Admission: RE | Admit: 2017-07-31 | Discharge: 2017-07-31 | Disposition: A | Discharge status: Home / Self Care | Payer: 59 | Source: Ambulatory Visit | Attending: Surgery | Admitting: Surgery

## 2017-07-31 ENCOUNTER — Encounter: Payer: Self-pay | Admitting: Physical Therapy

## 2017-07-31 DIAGNOSIS — C50411 Malignant neoplasm of upper-outer quadrant of right female breast: Secondary | ICD-10-CM

## 2017-07-31 DIAGNOSIS — M25511 Pain in right shoulder: Secondary | ICD-10-CM | POA: Diagnosis present

## 2017-07-31 DIAGNOSIS — E119 Type 2 diabetes mellitus without complications: Secondary | ICD-10-CM | POA: Diagnosis not present

## 2017-07-31 DIAGNOSIS — Z9884 Bariatric surgery status: Secondary | ICD-10-CM | POA: Diagnosis not present

## 2017-07-31 DIAGNOSIS — Z171 Estrogen receptor negative status [ER-]: Secondary | ICD-10-CM | POA: Diagnosis not present

## 2017-07-31 DIAGNOSIS — C50911 Malignant neoplasm of unspecified site of right female breast: Secondary | ICD-10-CM | POA: Diagnosis present

## 2017-07-31 DIAGNOSIS — R293 Abnormal posture: Secondary | ICD-10-CM | POA: Diagnosis not present

## 2017-07-31 DIAGNOSIS — Z8041 Family history of malignant neoplasm of ovary: Secondary | ICD-10-CM | POA: Diagnosis not present

## 2017-07-31 DIAGNOSIS — M25611 Stiffness of right shoulder, not elsewhere classified: Secondary | ICD-10-CM | POA: Insufficient documentation

## 2017-07-31 DIAGNOSIS — I10 Essential (primary) hypertension: Secondary | ICD-10-CM | POA: Diagnosis not present

## 2017-07-31 DIAGNOSIS — Z803 Family history of malignant neoplasm of breast: Secondary | ICD-10-CM | POA: Diagnosis not present

## 2017-07-31 DIAGNOSIS — Z6834 Body mass index (BMI) 34.0-34.9, adult: Secondary | ICD-10-CM | POA: Diagnosis not present

## 2017-07-31 DIAGNOSIS — E669 Obesity, unspecified: Secondary | ICD-10-CM | POA: Diagnosis not present

## 2017-07-31 DIAGNOSIS — D0511 Intraductal carcinoma in situ of right breast: Secondary | ICD-10-CM | POA: Diagnosis not present

## 2017-07-31 NOTE — Telephone Encounter (Signed)
Spoke to pt concerning [place] from 9.26.18. Denies questions or concerns regarding dx or treatment care plan. Encourage pt to call with needs. Received verbal understanding.

## 2017-07-31 NOTE — Progress Notes (Signed)
Ensure pre surgery drink given with instructions to complete by 0915 dos, pt verbalized understanding. 

## 2017-07-31 NOTE — Therapy (Signed)
[facility] [address], [place], [place], [address] Phone: [phone]   Fax:  [phone]  Physical Therapy Treatment  Patient Details  Name: Kara Crawford MRN: [id number] Date of Birth: July 10, 1966 Referring Provider: Dr. Alphonsa Overall  Encounter Date: 07/31/2017      PT End of Session - 07/31/17 1021    Visit Number 3   Number of Visits 8   Date for PT Re-Evaluation 08/23/17   PT Start Time 1017   PT Stop Time 1056   PT Time Calculation (min) 39 min   Activity Tolerance Patient tolerated treatment well   Behavior During Therapy [facility] for tasks assessed/performed      Past Medical History:  Diagnosis Date  . Anemia    anemia prior to uterine emboliztion procedure.  . Breast cancer ([place])   . Diabetes mellitus without complication ([place])    type 2  . Family history of breast cancer   . Family history of ovarian cancer   . Family history of prostate cancer   . Fibroids   . Headache   . Heart murmur   . Hyperlipidemia   . Hypertension     Past Surgical History:  Procedure Laterality Date  . BREATH TEK H PYLORI N/A 04/17/2014   Procedure: BREATH TEK H PYLORI;  Surgeon: Shann Medal, MD;  Location: [facility];  Service: General;  Laterality: N/A;  . CESAREAN SECTION     x3- normal development  . ESOPHAGOGASTRODUODENOSCOPY N/A 08/20/2015   Procedure: ESOPHAGOGASTRODUODENOSCOPY (EGD);  Surgeon: Alphonsa Overall, MD;  Location: [facility];  Service: General;  Laterality: N/A;  . EXPLORATORY LAPAROTOMY  05/13/2015  . GASTRIC ROUX-EN-Y N/A 10/06/2014   Procedure: LAPAROSCOPIC ROUX-EN-Y GASTRIC BYPASS WITH UPPER ENDOSCOPY;  Surgeon: Pedro Earls, MD;  Location: [facility];  Service: General;  Laterality: N/A;  . LAPAROSCOPY N/A 05/12/2015   Procedure: LAPAROSCOPY REPAIR OF PERFORATED BOWEL;  Surgeon: Alphonsa Overall, MD;  Location: [facility];  Service: General;  Laterality: N/A;  . UTERINE ARTERY EMBOLIZATION  08-16-2010     There were no vitals filed for this visit.      Subjective Assessment - 07/31/17 1022    Subjective The patch did well, a little itchy, skin ok. I felt good after last session, I feel like my motion is better. Pain only with end range external rotation.    Currently in Pain? No/denies  Currently none, only in end range ER   Pain Orientation Right   Multiple Pain Sites No            [facility] PT Assessment - 07/31/17 0001      AROM   Right Shoulder Flexion 140 Degrees   Right Shoulder ABduction 135 Degrees                     [facility] Adult PT Treatment/Exercise - 07/31/17 0001      Shoulder Exercises: Seated   External Rotation AROM;Strengthening;Both;20 reps;Theraband  2x 10     Shoulder Exercises: Standing   Flexion --  Wall ladder 8x with PTA inhibiting the upper trap: 15   Extension Strengthening;Right;10 reps;Theraband   Theraband Level (Shoulder Extension) Level 1 (Yellow)   Row Strengthening;Both;20 reps;Theraband   Theraband Level (Shoulder Row) Level 2 (Red)     Shoulder Exercises: Pulleys   Flexion 3 minutes   ABduction 3 minutes     Iontophoresis   Type of Iontophoresis Dexamethasone  #2   Location Anterior Rt shoulder  Dose 69ml   Time 6 hr wear  Pt verbalized understanding, skin intact     Neck Exercises: Stretches   Other Neck Stretches Thera cane Tp release to Rt upper trap 3 sets of compression then Lt side bends 5x                PT Education - 07/31/17 1055    Education provided Yes   Education Details Yellow band shoulder extension for HEP progression   Person(s) Educated Patient   Methods Explanation;Demonstration;Tactile cues;Verbal cues   Comprehension Verbalized understanding;Returned demonstration              Breast Clinic Goals - 07/26/17 1125      Patient will be able to verbalize understanding of pertinent lymphedema risk reduction practices relevant to her diagnosis specifically related to skin  care.   Time 1   Period Days   Status Achieved     Patient will be able to return demonstrate and/or verbalize understanding of the post-op home exercise program related to regaining shoulder range of motion.   Time 1   Period Days   Status Achieved     Patient will be able to verbalize understanding of the importance of attending the postoperative After Breast Cancer Class for further lymphedema risk reduction education and therapeutic exercise.   Time 1   Period Days   Status Achieved          Long Term Clinic Goals - 07/26/17 1126      CC Long Term Goal  #1   Title Patient will be independent in a home exercise program to improve shoulder ROM.   Time 4   Period Weeks   Status New     CC Long Term Goal  #2   Title Increase right shoulder active flexion to >/= 144 degrees for increased ease of reaching.   Time 4   Period Weeks   Status New     CC Long Term Goal  #3   Title Increase right shoulder abduction to >/= 160 degrees for increased ease of reaching and to obtain required radiation positioning.   Time 4   Period Weeks   Status New     CC Long Term Goal  #4   Title Patient will report >/= 50% decrease in right shoulder pain to tolerate daily tasks with greater ease.   Time 4   Period Weeks   Status New            Plan - 07/31/17 1021    Clinical Impression Statement Pt presented with decreased pain and increased AROM. Pain was described with only end range external rotation. Pt did well with ionto patches includingher skin. Repeated patch #2 today. Pt doing well with low level scap stabliazation  exercises with light bands.    Rehab Potential Good   Clinical Impairments Affecting Rehab Potential Cancer diagnosis   PT Frequency 3x / week   PT Duration 4 weeks   PT Treatment/Interventions Patient/family education;Passive range of motion;Manual techniques;Dry needling;Taping;Therapeutic exercise;Therapeutic activities;ADLs/Self Care Home Management;Moist  Heat;Cryotherapy;Iontophoresis 4mg /ml Dexamethasone   PT Next Visit Plan Scap/RTC strength, soft tissue work, mobs to right shoulder,, ROM exercises, ionto #3. Copy & paste the goals from note.   Consulted and Agree with Plan of Care Patient   Family Member Consulted Sister      Patient will benefit from skilled therapeutic intervention in order to improve the following deficits and impairments:  Decreased range of motion, Impaired UE functional  use, Pain, Decreased knowledge of precautions, Postural dysfunction  Visit Diagnosis: Carcinoma of upper-outer quadrant of right breast in female, estrogen receptor negative ([place])  Abnormal posture  Acute pain of right shoulder  Stiffness of right shoulder, not elsewhere classified     Problem List Patient Active Problem List   Diagnosis Date Noted  . Family history of breast cancer   . Family history of ovarian cancer   . Family history of prostate cancer   . Malignant neoplasm of upper-outer quadrant of right breast in female, estrogen receptor negative ([place]) 07/25/2017  . Perforated bowel ([place]) 05/13/2015  . Free intraperitoneal air 05/12/2015  . Submucosal lesion of esophagus 10/06/2014  . 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 Diabetes ([place]) 09/24/2014  . Morbid obesity ([place]) 03/20/2014    Kara Crawford, PTA  07/31/2017, 10:59 AM  [facility] [address] [place], [place], [address] Phone: [phone]   Fax:  [phone]  Name: Kara Crawford MRN: [id number] Date of Birth: September 30, 1966

## 2017-08-01 ENCOUNTER — Other Ambulatory Visit: Payer: Self-pay | Admitting: Surgery

## 2017-08-01 DIAGNOSIS — C50911 Malignant neoplasm of unspecified site of right female breast: Secondary | ICD-10-CM

## 2017-08-02 ENCOUNTER — Ambulatory Visit (HOSPITAL_COMMUNITY): Payer: 59

## 2017-08-02 ENCOUNTER — Encounter (HOSPITAL_BASED_OUTPATIENT_CLINIC_OR_DEPARTMENT_OTHER)
Admission: RE | Disposition: A | Discharge status: Home / Self Care | Payer: Self-pay | Source: Ambulatory Visit | Attending: Surgery

## 2017-08-02 ENCOUNTER — Ambulatory Visit (HOSPITAL_BASED_OUTPATIENT_CLINIC_OR_DEPARTMENT_OTHER): Payer: 59 | Admitting: Anesthesiology

## 2017-08-02 ENCOUNTER — Encounter (HOSPITAL_BASED_OUTPATIENT_CLINIC_OR_DEPARTMENT_OTHER): Payer: Self-pay | Admitting: Anesthesiology

## 2017-08-02 ENCOUNTER — Ambulatory Visit (HOSPITAL_BASED_OUTPATIENT_CLINIC_OR_DEPARTMENT_OTHER)
Admission: RE | Admit: 2017-08-02 | Discharge: 2017-08-02 | Disposition: A | Discharge status: Home / Self Care | Payer: 59 | Source: Ambulatory Visit | Attending: Surgery | Admitting: Surgery

## 2017-08-02 ENCOUNTER — Encounter: Payer: Self-pay | Admitting: Physical Therapy

## 2017-08-02 DIAGNOSIS — Z803 Family history of malignant neoplasm of breast: Secondary | ICD-10-CM | POA: Diagnosis not present

## 2017-08-02 DIAGNOSIS — E119 Type 2 diabetes mellitus without complications: Secondary | ICD-10-CM

## 2017-08-02 DIAGNOSIS — Z6834 Body mass index (BMI) 34.0-34.9, adult: Secondary | ICD-10-CM | POA: Insufficient documentation

## 2017-08-02 DIAGNOSIS — I10 Essential (primary) hypertension: Secondary | ICD-10-CM | POA: Diagnosis not present

## 2017-08-02 DIAGNOSIS — Z452 Encounter for adjustment and management of vascular access device: Secondary | ICD-10-CM | POA: Diagnosis not present

## 2017-08-02 DIAGNOSIS — R0989 Other specified symptoms and signs involving the circulatory and respiratory systems: Secondary | ICD-10-CM | POA: Diagnosis not present

## 2017-08-02 DIAGNOSIS — D0511 Intraductal carcinoma in situ of right breast: Secondary | ICD-10-CM | POA: Insufficient documentation

## 2017-08-02 DIAGNOSIS — Z9884 Bariatric surgery status: Secondary | ICD-10-CM | POA: Insufficient documentation

## 2017-08-02 DIAGNOSIS — Z8041 Family history of malignant neoplasm of ovary: Secondary | ICD-10-CM | POA: Insufficient documentation

## 2017-08-02 DIAGNOSIS — E669 Obesity, unspecified: Secondary | ICD-10-CM | POA: Insufficient documentation

## 2017-08-02 DIAGNOSIS — C50911 Malignant neoplasm of unspecified site of right female breast: Secondary | ICD-10-CM | POA: Diagnosis not present

## 2017-08-02 DIAGNOSIS — Z95828 Presence of other vascular implants and grafts: Secondary | ICD-10-CM

## 2017-08-02 DIAGNOSIS — Z419 Encounter for procedure for purposes other than remedying health state, unspecified: Secondary | ICD-10-CM

## 2017-08-02 DIAGNOSIS — C50411 Malignant neoplasm of upper-outer quadrant of right female breast: Secondary | ICD-10-CM | POA: Diagnosis not present

## 2017-08-02 HISTORY — PX: PORTACATH PLACEMENT: SHX2246

## 2017-08-02 LAB — GLUCOSE, CAPILLARY
Glucose-Capillary: 154 mg/dL — ABNORMAL HIGH (ref 65–99)
Glucose-Capillary: 111 mg/dL — ABNORMAL HIGH (ref 65–99)

## 2017-08-02 SURGERY — INSERTION, TUNNELED CENTRAL VENOUS DEVICE, WITH PORT
Anesthesia: General

## 2017-08-02 MED ORDER — FENTANYL CITRATE (PF) 100 MCG/2ML IJ SOLN: 50.0000 ug | INTRAMUSCULAR | Status: DC | PRN

## 2017-08-02 MED ORDER — CEFAZOLIN SODIUM-DEXTROSE 2-4 GM/100ML-% IV SOLN
INTRAVENOUS | Status: AC
  Filled 2017-08-02: qty 100

## 2017-08-02 MED ORDER — PHENYLEPHRINE HCL 10 MG/ML IJ SOLN
INTRAMUSCULAR | Status: DC | PRN
  Administered 2017-08-02 (×2): 80 ug via INTRAVENOUS

## 2017-08-02 MED ORDER — LIDOCAINE 2% (20 MG/ML) 5 ML SYRINGE
INTRAMUSCULAR | Status: AC
  Filled 2017-08-02: qty 5

## 2017-08-02 MED ORDER — HYDROCODONE-ACETAMINOPHEN 5-325 MG PO TABS: 1.0000 | ORAL_TABLET | Freq: Four times a day (QID) | ORAL | 0 refills | Status: DC | PRN

## 2017-08-02 MED ORDER — FENTANYL CITRATE (PF) 100 MCG/2ML IJ SOLN
INTRAMUSCULAR | Status: AC
  Filled 2017-08-02: qty 2

## 2017-08-02 MED ORDER — LACTATED RINGERS IV SOLN
INTRAVENOUS | Status: DC
  Administered 2017-08-02 (×2): via INTRAVENOUS

## 2017-08-02 MED ORDER — HYDROCODONE-ACETAMINOPHEN 7.5-325 MG PO TABS: 1.0000 | ORAL_TABLET | Freq: Once | ORAL | Status: DC | PRN

## 2017-08-02 MED ORDER — FENTANYL CITRATE (PF) 100 MCG/2ML IJ SOLN
INTRAMUSCULAR | Status: AC
  Filled 2017-08-02: qty 4

## 2017-08-02 MED ORDER — MIDAZOLAM HCL 5 MG/5ML IJ SOLN
INTRAMUSCULAR | Status: DC | PRN
  Administered 2017-08-02: 2 mg via INTRAVENOUS

## 2017-08-02 MED ORDER — HEPARIN SOD (PORK) LOCK FLUSH 100 UNIT/ML IV SOLN
INTRAVENOUS | Status: DC | PRN
  Administered 2017-08-02: 500 [IU] via INTRAVENOUS

## 2017-08-02 MED ORDER — DEXAMETHASONE SODIUM PHOSPHATE 10 MG/ML IJ SOLN
INTRAMUSCULAR | Status: AC
  Filled 2017-08-02: qty 1

## 2017-08-02 MED ORDER — HEPARIN (PORCINE) IN NACL 2-0.9 UNIT/ML-% IJ SOLN
INTRAMUSCULAR | Status: AC | PRN
  Administered 2017-08-02: 1 via INTRAVENOUS

## 2017-08-02 MED ORDER — FENTANYL CITRATE (PF) 100 MCG/2ML IJ SOLN
INTRAMUSCULAR | Status: DC | PRN
  Administered 2017-08-02: 50 ug via INTRAVENOUS

## 2017-08-02 MED ORDER — CHLORHEXIDINE GLUCONATE CLOTH 2 % EX PADS: 6.0000 | MEDICATED_PAD | Freq: Once | CUTANEOUS | Status: DC

## 2017-08-02 MED ORDER — DIAZEPAM 5 MG PO TABS: 5.0000 mg | ORAL_TABLET | Freq: Once | ORAL | 0 refills | Status: AC

## 2017-08-02 MED ORDER — SCOPOLAMINE 1 MG/3DAYS TD PT72: 1.0000 | MEDICATED_PATCH | Freq: Once | TRANSDERMAL | Status: DC | PRN

## 2017-08-02 MED ORDER — LIDOCAINE HCL (CARDIAC) 20 MG/ML IV SOLN
INTRAVENOUS | Status: DC | PRN
  Administered 2017-08-02: 30 mg via INTRAVENOUS

## 2017-08-02 MED ORDER — CEFAZOLIN SODIUM-DEXTROSE 2-4 GM/100ML-% IV SOLN
2.0000 g | INTRAVENOUS | Status: AC
  Administered 2017-08-02: 2 g via INTRAVENOUS

## 2017-08-02 MED ORDER — ACETAMINOPHEN 500 MG PO TABS
ORAL_TABLET | ORAL | Status: AC
  Filled 2017-08-02: qty 2

## 2017-08-02 MED ORDER — MEPERIDINE HCL 25 MG/ML IJ SOLN: 6.2500 mg | INTRAMUSCULAR | Status: DC | PRN

## 2017-08-02 MED ORDER — FENTANYL CITRATE (PF) 100 MCG/2ML IJ SOLN
25.0000 ug | INTRAMUSCULAR | Status: DC | PRN
  Administered 2017-08-02: 50 ug via INTRAVENOUS

## 2017-08-02 MED ORDER — ACETAMINOPHEN 500 MG PO TABS
1000.0000 mg | ORAL_TABLET | ORAL | Status: AC
  Administered 2017-08-02: 1000 mg via ORAL

## 2017-08-02 MED ORDER — GABAPENTIN 300 MG PO CAPS
300.0000 mg | ORAL_CAPSULE | ORAL | Status: AC
  Administered 2017-08-02: 300 mg via ORAL

## 2017-08-02 MED ORDER — ONDANSETRON HCL 4 MG/2ML IJ SOLN
INTRAMUSCULAR | Status: AC
  Filled 2017-08-02: qty 2

## 2017-08-02 MED ORDER — PHENYLEPHRINE 40 MCG/ML (10ML) SYRINGE FOR IV PUSH (FOR BLOOD PRESSURE SUPPORT)
PREFILLED_SYRINGE | INTRAVENOUS | Status: AC
  Filled 2017-08-02: qty 10

## 2017-08-02 MED ORDER — METOCLOPRAMIDE HCL 5 MG/ML IJ SOLN: 10.0000 mg | Freq: Once | INTRAMUSCULAR | Status: DC | PRN

## 2017-08-02 MED ORDER — GABAPENTIN 300 MG PO CAPS
ORAL_CAPSULE | ORAL | Status: AC
  Filled 2017-08-02: qty 1

## 2017-08-02 MED ORDER — ONDANSETRON HCL 4 MG/2ML IJ SOLN
INTRAMUSCULAR | Status: DC | PRN
  Administered 2017-08-02: 4 mg via INTRAVENOUS

## 2017-08-02 MED ORDER — MIDAZOLAM HCL 2 MG/2ML IJ SOLN
INTRAMUSCULAR | Status: AC
  Filled 2017-08-02: qty 2

## 2017-08-02 MED ORDER — PROPOFOL 10 MG/ML IV BOLUS
INTRAVENOUS | Status: AC
  Filled 2017-08-02: qty 20

## 2017-08-02 MED ORDER — PROPOFOL 10 MG/ML IV BOLUS
INTRAVENOUS | Status: DC | PRN
  Administered 2017-08-02: 150 mg via INTRAVENOUS

## 2017-08-02 MED ORDER — MIDAZOLAM HCL 2 MG/2ML IJ SOLN: 1.0000 mg | INTRAMUSCULAR | Status: DC | PRN

## 2017-08-02 MED ORDER — DEXAMETHASONE SODIUM PHOSPHATE 4 MG/ML IJ SOLN
INTRAMUSCULAR | Status: DC | PRN
  Administered 2017-08-02: 10 mg via INTRAVENOUS

## 2017-08-02 SURGICAL SUPPLY — 45 items
BAG DECANTER FOR FLEXI CONT (MISCELLANEOUS) ×3 IMPLANT
BENZOIN TINCTURE PRP APPL 2/3 (GAUZE/BANDAGES/DRESSINGS) ×3 IMPLANT
BLADE SURG 15 STRL LF DISP TIS (BLADE) ×1 IMPLANT
BLADE SURG 15 STRL SS (BLADE) ×2
CHLORAPREP W/TINT 26ML (MISCELLANEOUS) ×3 IMPLANT
CLEANER CAUTERY TIP 5X5 PAD (MISCELLANEOUS) ×1 IMPLANT
CLOSURE WOUND 1/4X4 (GAUZE/BANDAGES/DRESSINGS) ×1
COVER BACK TABLE 60X90IN (DRAPES) ×3 IMPLANT
COVER MAYO STAND STRL (DRAPES) ×3 IMPLANT
COVER PROBE 5X48 (MISCELLANEOUS)
DECANTER SPIKE VIAL GLASS SM (MISCELLANEOUS) IMPLANT
DERMABOND ADVANCED (GAUZE/BANDAGES/DRESSINGS) ×2
DERMABOND ADVANCED .7 DNX12 (GAUZE/BANDAGES/DRESSINGS) ×1 IMPLANT
DRAPE C-ARM 42X72 X-RAY (DRAPES) ×3 IMPLANT
DRAPE LAPAROTOMY T 102X78X121 (DRAPES) ×3 IMPLANT
DRAPE UTILITY XL STRL (DRAPES) ×3 IMPLANT
ELECT REM PT RETURN 9FT ADLT (ELECTROSURGICAL) ×3
ELECTRODE REM PT RTRN 9FT ADLT (ELECTROSURGICAL) ×1 IMPLANT
GAUZE SPONGE 4X4 12PLY STRL LF (GAUZE/BANDAGES/DRESSINGS) ×6 IMPLANT
GLOVE SURG SIGNA 7.5 PF LTX (GLOVE) ×3 IMPLANT
GOWN STRL REUS W/ TWL LRG LVL3 (GOWN DISPOSABLE) ×1 IMPLANT
GOWN STRL REUS W/ TWL XL LVL3 (GOWN DISPOSABLE) ×1 IMPLANT
GOWN STRL REUS W/TWL LRG LVL3 (GOWN DISPOSABLE) ×2
GOWN STRL REUS W/TWL XL LVL3 (GOWN DISPOSABLE) ×2
IV CATH AUTO 14GX1.75 SAFE ORG (IV SOLUTION) IMPLANT
IV CATH PLACEMENT UNIT 16 GA (IV SOLUTION) IMPLANT
IV KIT MINILOC 20X1 SAFETY (NEEDLE) IMPLANT
KIT CVR 48X5XPRB PLUP LF (MISCELLANEOUS) IMPLANT
KIT PORT POWER ISP 8FR (Stent) ×3 IMPLANT
NEEDLE HYPO 25X1 1.5 SAFETY (NEEDLE) ×3 IMPLANT
PACK BASIN DAY SURGERY FS (CUSTOM PROCEDURE TRAY) ×3 IMPLANT
PAD CLEANER CAUTERY TIP 5X5 (MISCELLANEOUS) ×2
PENCIL BUTTON HOLSTER BLD 10FT (ELECTRODE) ×3 IMPLANT
SET SHEATH INTRODUCER 10FR (MISCELLANEOUS) IMPLANT
SHEATH COOK PEEL AWAY SET 9F (SHEATH) IMPLANT
SLEEVE SCD COMPRESS KNEE MED (MISCELLANEOUS) ×3 IMPLANT
STRIP CLOSURE SKIN 1/4X4 (GAUZE/BANDAGES/DRESSINGS) ×2 IMPLANT
SUT ETHILON 2 0 FS 18 (SUTURE) IMPLANT
SUT ETHILON 3 0 PS 1 (SUTURE) IMPLANT
SUT MNCRL AB 4-0 PS2 18 (SUTURE) ×3 IMPLANT
SUT VICRYL 3-0 CR8 SH (SUTURE) ×3 IMPLANT
SYR 5ML LUER SLIP (SYRINGE) ×3 IMPLANT
SYR CONTROL 10ML LL (SYRINGE) ×3 IMPLANT
TOWEL OR 17X24 6PK STRL BLUE (TOWEL DISPOSABLE) ×3 IMPLANT
TOWEL OR NON WOVEN STRL DISP B (DISPOSABLE) ×3 IMPLANT

## 2017-08-02 NOTE — Anesthesia Postprocedure Evaluation (Signed)
Anesthesia Post Note  Patient: Kara Crawford  Procedure(s) Performed: INSERTION PORT-A-CATH (N/A )     Patient location during evaluation: PACU Anesthesia Type: General Level of consciousness: awake and alert and oriented Pain management: pain level controlled Vital Signs Assessment: post-procedure vital signs reviewed and stable Respiratory status: spontaneous breathing, nonlabored ventilation and respiratory function stable Cardiovascular status: blood pressure returned to baseline and stable Postop Assessment: no apparent nausea or vomiting Anesthetic complications: no    Last Vitals:  Vitals:   08/02/17 1500 08/02/17 1515  BP: 123/73 122/73  Pulse: 60 64  Resp: 11 16  Temp:    SpO2: 100% 100%    Last Pain:  Vitals:   08/02/17 1515  TempSrc:   PainSc: Asleep                 Ebrahim Deremer A.

## 2017-08-02 NOTE — H&P (Signed)
Kara Crawford  Location: [facility] Patient #: [id number] DOB: 1966-03-30 Married / Language: English / Race: Black or African American Female  History of Present Illness   The patient is a 51 year old female who presents with a complaint of right breast cancer.   The PCP is Dr. Carlis Stable  The patient was referred by Dr. Marcelo Baldy  The pateint is at the Breast [facility] is Drs. Lindi Adie and Isidore Moos  She has her husband, 2 sisters, and mother with her.  This started when she had right shoulder soreness. She woke up and felt a right breast and felt a mass in right breast. She had a mammogram in January at [facility] which had been negative. She was sent back for an updated mammogram. she had a prior left breast biopsy at [facility] about 2 years ago which reportedly was negative. her last period was about 1 year ago. She is not on hormone replacement.  Mammograms: at [facility] on 07/13/2017 show 1.0 cm oval mass in the 9 o'clock position. There are 3 abnormal lymph nodes. Biopsy: on 07/17/2017 [phone]) shows IDC, grade 3, ER - 0%, PR - 0%, Ki67 - 90%, Her2Neu - negative Family history of breast or ovarian cancer: An aunt on her father's side had breast cancer. A grandmother on her father's side had ovarian cancer. She is interested in genetics. On hormone therapy: No  I discussed the options for breast cancer treatment with the patient. The patient is at the [facility], which includes medical oncology and radiation oncology. I discussed the surgical options of lumpectomy vs. mastectomy. If mastectomy, there is the possibility of reconstruction. I discussed the options of lymph node biopsy. The treatment plan depends on the pathologic staging of the tumor and the patient's personal wishes. The risks of surgery include, but are not limited to, bleeding, infection, the need for further surgery, and nerve injury. The  patient has been given literature on the treatment of breast cancer. I discussed the indications and potential complications of the power port placement. The primary complications of the power port, include, but are not limited to, bleeding, infection, nerve injury, thrombosis, and pneumothorax.  Plan: 1) power port, 2) MRI, 3) chemotherapy, 4) lumpectomy and axillary node dissection (or targeted dissection) after chemotx  Past Medical History: 1. History of gastric bypass - 09/2014 - Hassell Done Initial weight - 240, BMI - 45.3 (she actually saw me first, but [facility] required that Dr. Hassell Done do the bypass) 2. Perforation of GJ - 05/2015 - Sanae Willetts 3. HTN x 6 years 4. DM x 6 years - still on Janumet, but Hgb A1C is 6.1 5. Last colonosocpy - Dec 2017 - Schooler 6. her shoulder is still sore  Social History: She has her husband Dominica Severin), 2 sisters (Paulette and Clara), and mother Najarro) with her. She has 3 daughters: Jasmin (39), Jada (19), and Jalen (16) Not working right now.   Medication History Tawni Pummel, RN; 07/26/2017 7:24 AM) Medications Reconciled   Physical Exam  General: WN AA F alert and generally healthy appearing. HEENT: Normal. Pupils equal.  Neck: Supple. No mass. No thyroid mass. Lymph Nodes: No supraclavicular or cervical nodes.  Lungs: Clear to auscultation and symmetric breath sounds. Heart: RRR. No murmur or rub.  Abdomen: Soft. No mass. No tenderness. No hernia. Normal bowel sounds.  Rectal: Not done.  Extremities: Good strength and ROM in upper and lower extremities.  Neurologic: Grossly intact to motor and sensory function. Psychiatric: Has normal  mood and affect. Behavior is normal.   Assessment & Plan  1.  BREAST CANCER, STAGE 2, RIGHT (C50.911)  Story: Mammograms: at [facility] on 07/13/2017 show 1.0 cm oval mass in the 9 o'clock position. There are 3 abnormal lymph nodes.  Biopsy: on 07/17/2017 [phone]) shows IDC, grade 3,  ER - 0%, PR - 0%, Ki67 - 90%, Her2Neu - negative, right axilary lymph node positive for cancer  Oncology: Lindi Adie and [facility]   Plan:    1) power port,     2) MRI, Addendum Note(Shaman Muscarella H. Raziyah Vanvleck MD; 08/01/2017 10:44 AM)  Breasts MRI on 07/29/2017 - 1. 2.0 cm mass involving the outer right breast at posterior depth, biopsy-proven invasive ductal carcinoma and DCIS.   2. Indeterminate non mass enhancement involving the upper inner quadrant of the left breast at anterior depth measuring 1.6 cm.   3. Solitary pathologic right axillary lymph node, biopsy-proven metastatic disease. No pathologic lymphadenopathy elsewhere.    3) chemotherapy,   4) lumpectomy and axillary node dissection (or targeted dissection) after chemotx  2. Indeterminate non mass enhancement involving the upper inner quadrant of the left breast at anterior depth measuring 1.6 cm sen on MRI  Will need biopsy of this aea.  3.  GASTRIC BYPASS STATUS FOR OBESITY (I54.27)  Story: RYGB - December 2015 - [facility]  4. Perforation of GJ - 05/2015 - Yvonnie Schinke 5. HTN x 6 years 6. DM x 6 years - still on Janumet, but Hgb A1C is 6.1   Alphonsa Overall, MD, [facility] Pager: [phone] Office phone:  [phone]

## 2017-08-02 NOTE — Interval H&P Note (Signed)
History and Physical Interval Note:  08/02/2017 1:18 PM  Kara Crawford  has presented today for surgery, with the diagnosis of RIGHT BREAST CANCER   The various methods of treatment have been discussed with the patient and family.  Husband at bedside.  After consideration of risks, benefits and other options for treatment, the patient has consented to  Procedure(s): INSERTION PORT-A-CATH (N/A) as a surgical intervention .  The patient's history has been reviewed, patient examined, no change in status, stable for surgery.  I have reviewed the patient's chart and labs.  Questions were answered to the patient's satisfaction.     Avian Konigsberg H

## 2017-08-02 NOTE — Discharge Instructions (Signed)
[facility] - DISCHARGE INSTRUCTIONS TO PATIENT  Activity:  Driving - May drive in a day or two, when off pain meds   Lifting - Take it easy for 5 days, then no limit  Wound Care:   Leave incision dry for 2 days, then may shower.  Diet:  As tolerated  Follow up appointment:  Call Dr. Pollie Friar office [facility]) at [phone] for an appointment in 2 weeks.  Medications and dosages:  Resume your home medications.  You have a prescription for:  Vicodin for pain.            Valium for anxiety for MRI next week.  Call Dr. Lucia Gaskins or his office  [phone]) if you have:  Temperature greater than 100.4,  Persistent nausea and vomiting,  Severe uncontrolled pain,  Redness, tenderness, or signs of infection (pain, swelling, redness, odor or green/yellow discharge around the site),  Difficulty breathing, headache or visual disturbances,  Any other questions or concerns you may have after discharge.  In an emergency, call 911 or go to an Emergency Department at a nearby hospital.    Post Anesthesia Home Care Instructions  Activity: Get plenty of rest for the remainder of the day. A responsible individual must stay with you for 24 hours following the procedure.  For the next 24 hours, DO NOT: -Drive a car -Paediatric nurse -Drink alcoholic beverages -Take any medication unless instructed by your physician -Make any legal decisions or sign important papers.  Meals: Start with liquid foods such as gelatin or soup. Progress to regular foods as tolerated. Avoid greasy, spicy, heavy foods. If nausea and/or vomiting occur, drink only clear liquids until the nausea and/or vomiting subsides. Call your physician if vomiting continues.  Special Instructions/Symptoms: Your throat may feel dry or sore from the anesthesia or the breathing tube placed in your throat during surgery. If this causes discomfort, gargle with warm salt water. The discomfort should  disappear within 24 hours.  If you had a scopolamine patch placed behind your ear for the management of post- operative nausea and/or vomiting:  1. The medication in the patch is effective for 72 hours, after which it should be removed.  Wrap patch in a tissue and discard in the trash. Wash hands thoroughly with soap and water. 2. You may remove the patch earlier than 72 hours if you experience unpleasant side effects which may include dry mouth, dizziness or visual disturbances. 3. Avoid touching the patch. Wash your hands with soap and water after contact with the patch.

## 2017-08-02 NOTE — Anesthesia Procedure Notes (Signed)
Procedure Name: LMA Insertion Date/Time: 08/02/2017 1:30 PM Performed by: Toula Moos L Pre-anesthesia Checklist: Patient identified, Emergency Drugs available, Suction available, Patient being monitored and Timeout performed Patient Re-evaluated:Patient Re-evaluated prior to induction Oxygen Delivery Method: Circle system utilized Preoxygenation: Pre-oxygenation with 100% oxygen Induction Type: IV induction Ventilation: Mask ventilation without difficulty LMA: LMA inserted LMA Size: 4.0 Number of attempts: 1 Airway Equipment and Method: Bite block Placement Confirmation: positive ETCO2 Tube secured with: Tape Dental Injury: Teeth and Oropharynx as per pre-operative assessment

## 2017-08-02 NOTE — Anesthesia Preprocedure Evaluation (Signed)
Anesthesia Evaluation  Patient identified by MRN, date of birth, ID band Patient awake    Reviewed: Allergy & Precautions, H&P , Patient's Chart, lab work & pertinent test results, reviewed documented beta blocker date and time   Airway Mallampati: II  TM Distance: >3 FB Neck ROM: full    Dental no notable dental hx.    Pulmonary    Pulmonary exam normal breath sounds clear to auscultation       Cardiovascular hypertension,  Rhythm:regular Rate:Normal     Neuro/Psych    GI/Hepatic   Endo/Other  diabetes  Renal/GU      Musculoskeletal   Abdominal   Peds  Hematology   Anesthesia Other Findings   Reproductive/Obstetrics                             Anesthesia Physical Anesthesia Plan  ASA: III  Anesthesia Plan: General   Post-op Pain Management:    Induction: Intravenous  PONV Risk Score and Plan: 2 and Ondansetron and Dexamethasone  Airway Management Planned: LMA  Additional Equipment:   Intra-op Plan:   Post-operative Plan:   Informed Consent: I have reviewed the patients History and Physical, chart, labs and discussed the procedure including the risks, benefits and alternatives for the proposed anesthesia with the patient or authorized representative who has indicated his/her understanding and acceptance.   Dental Advisory Given  Plan Discussed with: CRNA and Surgeon  Anesthesia Plan Comments: ( )        Anesthesia Quick Evaluation

## 2017-08-02 NOTE — Transfer of Care (Signed)
Immediate Anesthesia Transfer of Care Note  Patient: Kara Crawford  Procedure(s) Performed: INSERTION PORT-A-CATH (N/A )  Patient Location: PACU  Anesthesia Type:General  Level of Consciousness: awake and patient cooperative  Airway & Oxygen Therapy: Patient Spontanous Breathing and Patient connected to face mask oxygen  Post-op Assessment: Report given to RN and Post -op Vital signs reviewed and stable  Post vital signs: Reviewed and stable  Last Vitals:  Vitals:   08/02/17 1058 08/02/17 1435  BP: 133/75 121/73  Pulse: 82 86  Resp: 20   Temp: 36.7 C   SpO2: 100% 100%    Last Pain:  Vitals:   08/02/17 1058  TempSrc: Oral  PainSc: 0-No pain         Complications: No apparent anesthesia complications

## 2017-08-02 NOTE — Op Note (Signed)
08/02/2017  2:23 PM  PATIENT:  Kara Crawford, 51 y.o., female MRN: [id number] DOB: 06/23/1966  PREOP DIAGNOSIS:  RIGHT BREAST CANCER , anticipate chemotherapy  POSTOP DIAGNOSIS:   Right breast cancer, anticipate chemotherapy  PROCEDURE:   Procedure(s): INSERTION Left subclavian power port  SURGEON:   David Newman, M.D.  ANESTHESIA:   spinal and general  Anesthesiologist: Foster, Michael, MD CRNA: Linka, Margaret L, CRNA  General  EBL:  minimal  ml  COUNTS CORRECT:  YES  INDICATIONS FOR PROCEDURE:  Kara Crawford is a 51 y.o. (DOB: 03/24/1966) AA female whose primary care physician is McNeill, Wendy, MD and comes for power port placement for the treatment of right breast cancer.  Dr. Gudena is her treating oncologist.   The indications and risks of the surgery were explained to the patient.  The risks include, but are not limited to, infection, bleeding, pneumothorax, nerve injury, and thrombosis of the vein.  OPERATIVE NOTE:  The patient was taken to Room #5 at [facility].  Anesthesia was provided by Anesthesiologist: Foster, Michael, MD CRNA: Linka, Margaret L, CRNA.  At the beginning of the operation, the patient was given 2 gm Ancef, had a roll placed under her back, and had the upper chest/neck prepped with Chloroprep and draped.   A time out was held and the surgery checklist reviewed.   The patient was placed in Trendelenburg position.  The left subclavian was accessed with a 16 gauge needle and a guide wire threaded through the needle into the vein.  The position of the wire was checked with fluoroscopy.   I then developed a pocket in the upper inner aspect of the left chest for the port reservoir.  I used the Bard Power Port kit for venous access.  The reservoir was sewn in place with a 3-0 Vicryl suture.  The reservoir had been flushed with dilute (10 units/cc) heparin.   I then passed the silastic tubing from the reservoir incision to the subclavian stick site and  used the 8 French introducer to pass it into the vein.  The tip of the silastic catheter was position at the junction of the SVC and the right atrium under fluoroscopy.  The silastic catheter was then attached to the port with the bayonet device.     Since she is getting chemotherapy tomorrow, I left the port accessed.  I used a right angle Huber needle to access the port and flushed it with 4 cc of concentrated heparin (100 units/cc).   The wounds were then closed with 3-0 vicryl subcutaneous sutures and the skin closed with a 4-0 Monocryl suture.  The skin was painted with tincture of benzoin and steristripped.   The patient was transferred to the recovery room in good condition.  The sponge and needle count were correct at the end of the case.  A CXR is ordered for port placement and pending at the time of this note.  David Newman, MD, FACS [facility] Pager: [phone] Office phone:  [phone]  

## 2017-08-03 ENCOUNTER — Ambulatory Visit (HOSPITAL_BASED_OUTPATIENT_CLINIC_OR_DEPARTMENT_OTHER): Payer: 59 | Admitting: Hematology and Oncology

## 2017-08-03 ENCOUNTER — Other Ambulatory Visit (HOSPITAL_BASED_OUTPATIENT_CLINIC_OR_DEPARTMENT_OTHER): Payer: 59

## 2017-08-03 ENCOUNTER — Ambulatory Visit (HOSPITAL_BASED_OUTPATIENT_CLINIC_OR_DEPARTMENT_OTHER): Payer: 59

## 2017-08-03 ENCOUNTER — Encounter (HOSPITAL_BASED_OUTPATIENT_CLINIC_OR_DEPARTMENT_OTHER): Payer: Self-pay | Admitting: Surgery

## 2017-08-03 ENCOUNTER — Telehealth: Payer: Self-pay

## 2017-08-03 ENCOUNTER — Encounter: Payer: Self-pay | Admitting: *Deleted

## 2017-08-03 ENCOUNTER — Ambulatory Visit: Payer: 59

## 2017-08-03 DIAGNOSIS — C773 Secondary and unspecified malignant neoplasm of axilla and upper limb lymph nodes: Secondary | ICD-10-CM

## 2017-08-03 DIAGNOSIS — C50411 Malignant neoplasm of upper-outer quadrant of right female breast: Secondary | ICD-10-CM | POA: Diagnosis not present

## 2017-08-03 DIAGNOSIS — Z171 Estrogen receptor negative status [ER-]: Secondary | ICD-10-CM

## 2017-08-03 DIAGNOSIS — Z5111 Encounter for antineoplastic chemotherapy: Secondary | ICD-10-CM

## 2017-08-03 LAB — CBC WITH DIFFERENTIAL/PLATELET
BASO%: 0 % (ref 0.0–2.0)
BASOS ABS: 0 10*3/uL (ref 0.0–0.1)
EOS ABS: 0 10*3/uL (ref 0.0–0.5)
EOS%: 0.1 % (ref 0.0–7.0)
HCT: 34.2 % — ABNORMAL LOW (ref 34.8–46.6)
HEMOGLOBIN: 11.6 g/dL (ref 11.6–15.9)
LYMPH%: 21.2 % (ref 14.0–49.7)
MCH: 31.2 pg (ref 25.1–34.0)
MCHC: 33.9 g/dL (ref 31.5–36.0)
MCV: 91.9 fL (ref 79.5–101.0)
MONO#: 0.3 10*3/uL (ref 0.1–0.9)
MONO%: 4 % (ref 0.0–14.0)
NEUT#: 5.6 10*3/uL (ref 1.5–6.5)
NEUT%: 74.7 % (ref 38.4–76.8)
PLATELETS: 275 10*3/uL (ref 145–400)
RBC: 3.72 10*6/uL (ref 3.70–5.45)
RDW: 12.5 % (ref 11.2–14.5)
WBC: 7.5 10*3/uL (ref 3.9–10.3)
lymph#: 1.6 10*3/uL (ref 0.9–3.3)

## 2017-08-03 LAB — COMPREHENSIVE METABOLIC PANEL
ALBUMIN: 4.2 g/dL (ref 3.5–5.0)
ALK PHOS: 56 U/L (ref 40–150)
ALT: 16 U/L (ref 0–55)
ANION GAP: 10 meq/L (ref 3–11)
AST: 15 U/L (ref 5–34)
BILIRUBIN TOTAL: 0.36 mg/dL (ref 0.20–1.20)
BUN: 11.9 mg/dL (ref 7.0–26.0)
CO2: 27 mEq/L (ref 22–29)
CREATININE: 0.9 mg/dL (ref 0.6–1.1)
Calcium: 10 mg/dL (ref 8.4–10.4)
Chloride: 102 mEq/L (ref 98–109)
GLUCOSE: 137 mg/dL (ref 70–140)
POTASSIUM: 3.4 meq/L — AB (ref 3.5–5.1)
SODIUM: 138 meq/L (ref 136–145)
Total Protein: 7.6 g/dL (ref 6.4–8.3)

## 2017-08-03 MED ORDER — SODIUM CHLORIDE 0.9% FLUSH
10.0000 mL | INTRAVENOUS | Status: DC | PRN
  Filled 2017-08-03: qty 10

## 2017-08-03 MED ORDER — PEGFILGRASTIM 6 MG/0.6ML ~~LOC~~ PSKT
6.0000 mg | PREFILLED_SYRINGE | Freq: Once | SUBCUTANEOUS | Status: AC
  Administered 2017-08-03: 6 mg via SUBCUTANEOUS
  Filled 2017-08-03: qty 0.6

## 2017-08-03 MED ORDER — SODIUM CHLORIDE 0.9% FLUSH
10.0000 mL | INTRAVENOUS | Status: AC | PRN
  Administered 2017-08-03 (×2): 10 mL via INTRAVENOUS
  Filled 2017-08-03: qty 10

## 2017-08-03 MED ORDER — PALONOSETRON HCL INJECTION 0.25 MG/5ML
INTRAVENOUS | Status: AC
  Filled 2017-08-03: qty 5

## 2017-08-03 MED ORDER — SODIUM CHLORIDE 0.9 % IV SOLN
600.0000 mg/m2 | Freq: Once | INTRAVENOUS | Status: AC
  Administered 2017-08-03: 1120 mg via INTRAVENOUS
  Filled 2017-08-03: qty 56

## 2017-08-03 MED ORDER — DOXORUBICIN HCL CHEMO IV INJECTION 2 MG/ML
60.0000 mg/m2 | Freq: Once | INTRAVENOUS | Status: AC
  Administered 2017-08-03: 112 mg via INTRAVENOUS
  Filled 2017-08-03: qty 56

## 2017-08-03 MED ORDER — SODIUM CHLORIDE 0.9 % IV SOLN
Freq: Once | INTRAVENOUS | Status: AC
  Administered 2017-08-03: 14:00:00 via INTRAVENOUS
  Filled 2017-08-03: qty 5

## 2017-08-03 MED ORDER — HEPARIN SOD (PORK) LOCK FLUSH 100 UNIT/ML IV SOLN
500.0000 [IU] | Freq: Once | INTRAVENOUS | Status: AC | PRN
  Administered 2017-08-03: 500 [IU]
  Filled 2017-08-03: qty 5

## 2017-08-03 MED ORDER — SODIUM CHLORIDE 0.9 % IV SOLN
Freq: Once | INTRAVENOUS | Status: AC
  Administered 2017-08-03: 13:00:00 via INTRAVENOUS

## 2017-08-03 MED ORDER — PALONOSETRON HCL INJECTION 0.25 MG/5ML
0.2500 mg | Freq: Once | INTRAVENOUS | Status: AC
  Administered 2017-08-03: 0.25 mg via INTRAVENOUS

## 2017-08-03 NOTE — Progress Notes (Signed)
Patient Care Team: Cari Caraway, MD as PCP - General (Family Medicine) Christophe Louis, MD as Consulting Physician (Obstetrics and Gynecology) Alphonsa Overall, MD as Consulting Physician (General Surgery) Nicholas Lose, MD as Consulting Physician (Hematology and Oncology) Eppie Gibson, MD as Attending Physician (Radiation Oncology)  DIAGNOSIS:  Encounter Diagnosis  Name Primary?  . 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 Today is cycle 1 of her treatment.she had a port placed yesterday and is very sore from that. She is also anxious about staring treatment.  REVIEW OF SYSTEMS:   Constitutional: Denies fevers, chills or abnormal weight loss Eyes: Denies blurriness of vision Ears, nose, mouth, throat, and face: Denies mucositis or sore throat Respiratory: Denies cough, dyspnea or wheezes Cardiovascular: Denies palpitation, chest discomfort Gastrointestinal:  Denies nausea, heartburn or change in bowel habits Skin: Denies abnormal skin rashes Lymphatics: Denies new lymphadenopathy or easy bruising Neurological:Denies numbness, tingling or new  weaknesses Behavioral/Psych: anxiety  Extremities: No lower extremity edema : Pain related to the recently placed left chest wall port Breast:  denies any pain or lumps or nodules in either breasts All other systems were reviewed with the patient and are negative.  I have reviewed the past medical history, past surgical history, social history and family history with the patient and they are unchanged from previous note.  ALLERGIES:  has No Known Allergies.  MEDICATIONS:  Current Outpatient Prescriptions  Medication Sig Dispense Refill  . acetaminophen (TYLENOL) 325 MG tablet Take 650 mg by mouth as needed.    Marland Kitchen amLODipine (NORVASC) 10 MG tablet Take 10 mg by mouth every morning.     Marland Kitchen atorvastatin (LIPITOR) 80 MG tablet Take 80 mg by mouth every morning.     . Calcium Carbonate-Vitamin D (CALCIUM 500/D PO) Take 2 tablets by mouth daily.    . Cholecalciferol (VITAMIN D3 PO) Take by mouth.    . Cyanocobalamin (VITAMIN B-12 PO) Take 1 tablet by mouth every morning.    Marland Kitchen dexamethasone (DECADRON) 4 MG tablet Take 2 tablets by mouth once a day on the day after chemotherapy and then take 2 tablets two times a day for 2 days. Take with food. 30 tablet 1  . HYDROcodone-acetaminophen (NORCO/VICODIN) 5-325 MG tablet Take 1-2 tablets by mouth every 6 (six) hours as needed for moderate pain. 15 tablet 0  . lidocaine-prilocaine (EMLA) cream Apply to affected area once 30 g 3  . lisinopril-hydrochlorothiazide (PRINZIDE,ZESTORETIC) 20-12.5 MG per tablet Take 2 tablets by mouth daily.     Marland Kitchen LORazepam (ATIVAN) 0.5 MG tablet Take 1 tablet (0.5 mg total) by mouth every 6 (six) hours as needed (Nausea or vomiting). [place]  tablet 0  . Melatonin 10 MG CAPS Take 1 tablet by mouth at bedtime.    . metoprolol succinate (TOPROL-XL) 25 MG 24 hr tablet Take 25 mg by mouth every morning.     . ondansetron (ZOFRAN) 8 MG tablet Take 1 tablet (8 mg total) by mouth 2 (two) times daily as needed. Start on the third day after  chemotherapy. 30 tablet 1  . pantoprazole (PROTONIX) 40 MG tablet Take 40 mg by mouth daily.    . prochlorperazine (COMPAZINE) 10 MG tablet TAKE 1 TABLET(10 MG) BY MOUTH EVERY 6 HOURS AS NEEDED FOR NAUSEA OR VOMITING 385 tablet 1  . sitaGLIPtin-metformin (JANUMET) 50-500 MG per tablet Take 1 tablet by mouth 2 (two) times daily with a meal.      No current facility-administered medications for this visit.    Facility-Administered Medications Ordered in Other Visits  Medication Dose Route Frequency Provider Last Rate Last Dose  . sodium chloride flush (NS) 0.9 % injection 10 mL  10 mL Intravenous PRN Nicholas Lose, MD   10 mL at 08/03/17 1115    PHYSICAL EXAMINATION: ECOG PERFORMANCE STATUS: 1 - Symptomatic but completely ambulatory  Vitals:   08/03/17 1132  BP: (!) 150/92  Pulse: 72  Resp: 18  Temp: 98.8 F (37.1 C)  SpO2: 98%   Filed Weights   08/03/17 1132  Weight: 182 lb 11.2 oz (82.9 kg)    GENERAL:alert, no distress and comfortable SKIN: skin color, texture, turgor are normal, no rashes or significant lesions EYES: normal, Conjunctiva are pink and non-injected, sclera clear OROPHARYNX:no exudate, no erythema and lips, buccal mucosa, and tongue normal  NECK: supple, thyroid normal size, non-tender, without nodularity LYMPH:  no palpable lymphadenopathy in the cervical, axillary or inguinal LUNGS: clear to auscultation and percussion with normal breathing effort HEART: regular rate & rhythm and no murmurs and no lower extremity edema ABDOMEN:abdomen soft, non-tender and normal bowel sounds MUSCULOSKELETAL:no cyanosis of digits and no clubbing  NEURO: alert & oriented x 3 with fluent speech, no focal motor/sensory deficits EXTREMITIES: No lower extremity edema  LABORATORY DATA:  I have reviewed the data as listed   Chemistry      Component Value Date/Time   NA 143 07/26/2017 0844   K 3.7 07/26/2017 0844   CL 102 05/15/2015 0407   CO2 31 (H) 07/26/2017 0844   BUN  15.6 07/26/2017 0844   CREATININE 1.0 07/26/2017 0844      Component Value Date/Time   CALCIUM 10.9 (H) 07/26/2017 0844   ALKPHOS 61 07/26/2017 0844   AST 20 07/26/2017 0844   ALT 20 07/26/2017 0844   BILITOT 0.65 07/26/2017 0844       Lab Results  Component Value Date   WBC 5.1 07/26/2017   HGB 13.4 07/26/2017   HCT 39.4 07/26/2017   MCV 92.2 07/26/2017   PLT 321 07/26/2017   NEUTROABS 2.5 07/26/2017    ASSESSMENT & PLAN:  Malignant neoplasm of upper-outer quadrant of right breast in female, estrogen receptor negative ([place]) 07/17/2017: Right upper outer quadrant painful palpable right breast mass by ultrasound measured 1 cm with 3 abnormal axillary lymph nodes 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 Neoadjuvant chemotherapy with Adriamycin and Cytoxan dose dense 4 followed by Taxol weekly 12 2. Followed by breast conserving surgery with sentinel lymph node study + targeted axillary dissection 3. Followed by adjuvant  radiation therapy --------------------------------------------------------------------------- Current treatment: Cycle 1 day 1 neoadjuvant dose dense Adriamycin and Cytoxan Chemotherapy consent obtained Port has been placed Labs are reviewed Antiemetics were reviewed Echocardiogram 07/27/2017: EF 60-65%  Return to clinic in one week for toxicity check   I spent 25 minutes talking to the patient of which more than half was spent in counseling and coordination of care.  No orders of the defined types were placed in this encounter.  The patient has a good understanding of the overall plan. she agrees with it. she will call with any problems that may develop before the next visit here.   Rulon Eisenmenger, MD 08/03/17

## 2017-08-03 NOTE — Patient Instructions (Addendum)
Cancer Center Discharge Instructions for Patients Receiving Chemotherapy  Today you received the following chemotherapy agents: Adriamycin, Cytoxan  To help prevent nausea and vomiting after your treatment, we encourage you to take your nausea medication as directed.   If you develop nausea and vomiting that is not controlled by your nausea medication, call the clinic.   BELOW ARE SYMPTOMS THAT SHOULD BE REPORTED IMMEDIATELY:  *FEVER GREATER THAN 100.5 F  *CHILLS WITH OR WITHOUT FEVER  NAUSEA AND VOMITING THAT IS NOT CONTROLLED WITH YOUR NAUSEA MEDICATION  *UNUSUAL SHORTNESS OF BREATH  *UNUSUAL BRUISING OR BLEEDING  TENDERNESS IN MOUTH AND THROAT WITH OR WITHOUT PRESENCE OF ULCERS  *URINARY PROBLEMS  *BOWEL PROBLEMS  UNUSUAL RASH Items with * indicate a potential emergency and should be followed up as soon as possible.  Feel free to call the clinic should you have any questions or concerns. The clinic phone number is [phone].  Please show the CHEMO ALERT CARD at check-in to the Emergency Department and triage nurse.  Doxorubicin injection What is this medicine? DOXORUBICIN (dox oh ROO bi sin) is a chemotherapy drug. It is used to treat many kinds of cancer like leukemia, lymphoma, neuroblastoma, sarcoma, and Wilms' tumor. It is also used to treat bladder cancer, breast cancer, lung cancer, ovarian cancer, stomach cancer, and thyroid cancer. This medicine may be used for other purposes; ask your health care provider or pharmacist if you have questions. COMMON BRAND NAME(S): Adriamycin, Adriamycin PFS, Adriamycin RDF, Rubex What should I tell my health care provider before I take this medicine? They need to know if you have any of these conditions: -heart disease -history of low blood counts caused by a medicine -liver disease -recent or ongoing radiation therapy -an unusual or allergic reaction to doxorubicin, other chemotherapy agents, other medicines,  foods, dyes, or preservatives -pregnant or trying to get pregnant -breast-feeding How should I use this medicine? This drug is given as an infusion into a vein. It is administered in a hospital or clinic by a specially trained health care professional. If you have pain, swelling, burning or any unusual feeling around the site of your injection, tell your health care professional right away. Talk to your pediatrician regarding the use of this medicine in children. Special care may be needed. Overdosage: If you think you have taken too much of this medicine contact a poison control center or emergency room at once. NOTE: This medicine is only for you. Do not share this medicine with others. What if I miss a dose? It is important not to miss your dose. Call your doctor or health care professional if you are unable to keep an appointment. What may interact with this medicine? This medicine may interact with the following medications: -6-mercaptopurine -paclitaxel -phenytoin -St. John's Wort -trastuzumab -verapamil This list may not describe all possible interactions. Give your health care provider a list of all the medicines, herbs, non-prescription drugs, or dietary supplements you use. Also tell them if you smoke, drink alcohol, or use illegal drugs. Some items may interact with your medicine. What should I watch for while using this medicine? This drug may make you feel generally unwell. This is not uncommon, as chemotherapy can affect healthy cells as well as cancer cells. Report any side effects. Continue your course of treatment even though you feel ill unless your doctor tells you to stop. There is a maximum amount of this medicine you should receive throughout your life. The amount depends on the   medical condition being treated and your overall health. Your doctor will watch how much of this medicine you receive in your lifetime. Tell your doctor if you have taken this medicine before. You  may need blood work done while you are taking this medicine. Your urine may turn red for a few days after your dose. This is not blood. If your urine is dark or brown, call your doctor. In some cases, you may be given additional medicines to help with side effects. Follow all directions for their use. Call your doctor or health care professional for advice if you get a fever, chills or sore throat, or other symptoms of a cold or flu. Do not treat yourself. This drug decreases your body's ability to fight infections. Try to avoid being around people who are sick. This medicine may increase your risk to bruise or bleed. Call your doctor or health care professional if you notice any unusual bleeding. Talk to your doctor about your risk of cancer. You may be more at risk for certain types of cancers if you take this medicine. Do not become pregnant while taking this medicine or for 6 months after stopping it. Women should inform their doctor if they wish to become pregnant or think they might be pregnant. Men should not father a child while taking this medicine and for 6 months after stopping it. There is a potential for serious side effects to an unborn child. Talk to your health care professional or pharmacist for more information. Do not breast-feed an infant while taking this medicine. This medicine has caused ovarian failure in some women and reduced sperm counts in some men This medicine may interfere with the ability to have a child. Talk with your doctor or health care professional if you are concerned about your fertility. What side effects may I notice from receiving this medicine? Side effects that you should report to your doctor or health care professional as soon as possible: -allergic reactions like skin rash, itching or hives, swelling of the face, lips, or tongue -breathing problems -chest pain -fast or irregular heartbeat -low blood counts - this medicine may decrease the number of white  blood cells, red blood cells and platelets. You may be at increased risk for infections and bleeding. -pain, redness, or irritation at site where injected -signs of infection - fever or chills, cough, sore throat, pain or difficulty passing urine -signs of decreased platelets or bleeding - bruising, pinpoint red spots on the skin, black, tarry stools, blood in the urine -swelling of the ankles, feet, hands -tiredness -weakness Side effects that usually do not require medical attention (report to your doctor or health care professional if they continue or are bothersome): -diarrhea -hair loss -mouth sores -nail discoloration or damage -nausea -red colored urine -vomiting This list may not describe all possible side effects. Call your doctor for medical advice about side effects. You may report side effects to FDA at 1-800-FDA-1088. Where should I keep my medicine? This drug is given in a hospital or clinic and will not be stored at home. NOTE: This sheet is a summary. It may not cover all possible information. If you have questions about this medicine, talk to your doctor, pharmacist, or health care provider.  2018 Elsevier/Gold Standard (2015-12-14 11:28:51)  Cyclophosphamide injection What is this medicine? CYCLOPHOSPHAMIDE (sye kloe FOSS fa mide) is a chemotherapy drug. It slows the growth of cancer cells. This medicine is used to treat many types of cancer like lymphoma, myeloma,  leukemia, breast cancer, and ovarian cancer, to name a few. This medicine may be used for other purposes; ask your health care provider or pharmacist if you have questions. COMMON BRAND NAME(S): Cytoxan, Neosar What should I tell my health care provider before I take this medicine? They need to know if you have any of these conditions: -blood disorders -history of other chemotherapy -infection -kidney disease -liver disease -recent or ongoing radiation therapy -tumors in the bone marrow -an unusual or  allergic reaction to cyclophosphamide, other chemotherapy, other medicines, foods, dyes, or preservatives -pregnant or trying to get pregnant -breast-feeding How should I use this medicine? This drug is usually given as an injection into a vein or muscle or by infusion into a vein. It is administered in a hospital or clinic by a specially trained health care professional. Talk to your pediatrician regarding the use of this medicine in children. Special care may be needed. Overdosage: If you think you have taken too much of this medicine contact a poison control center or emergency room at once. NOTE: This medicine is only for you. Do not share this medicine with others. What if I miss a dose? It is important not to miss your dose. Call your doctor or health care professional if you are unable to keep an appointment. What may interact with this medicine? This medicine may interact with the following medications: -amiodarone -amphotericin B -azathioprine -certain antiviral medicines for HIV or AIDS such as protease inhibitors (e.g., indinavir, ritonavir) and zidovudine -certain blood pressure medications such as benazepril, captopril, enalapril, fosinopril, lisinopril, moexipril, monopril, perindopril, quinapril, ramipril, trandolapril -certain cancer medications such as anthracyclines (e.g., daunorubicin, doxorubicin), busulfan, cytarabine, paclitaxel, pentostatin, tamoxifen, trastuzumab -certain diuretics such as chlorothiazide, chlorthalidone, hydrochlorothiazide, indapamide, metolazone -certain medicines that treat or prevent blood clots like warfarin -certain muscle relaxants such as succinylcholine -cyclosporine -etanercept -indomethacin -medicines to increase blood counts like filgrastim, pegfilgrastim, sargramostim -medicines used as general anesthesia -metronidazole -natalizumab This list may not describe all possible interactions. Give your health care provider a list of all the  medicines, herbs, non-prescription drugs, or dietary supplements you use. Also tell them if you smoke, drink alcohol, or use illegal drugs. Some items may interact with your medicine. What should I watch for while using this medicine? Visit your doctor for checks on your progress. This drug may make you feel generally unwell. This is not uncommon, as chemotherapy can affect healthy cells as well as cancer cells. Report any side effects. Continue your course of treatment even though you feel ill unless your doctor tells you to stop. Drink water or other fluids as directed. Urinate often, even at night. In some cases, you may be given additional medicines to help with side effects. Follow all directions for their use. Call your doctor or health care professional for advice if you get a fever, chills or sore throat, or other symptoms of a cold or flu. Do not treat yourself. This drug decreases your body's ability to fight infections. Try to avoid being around people who are sick. This medicine may increase your risk to bruise or bleed. Call your doctor or health care professional if you notice any unusual bleeding. Be careful brushing and flossing your teeth or using a toothpick because you may get an infection or bleed more easily. If you have any dental work done, tell your dentist you are receiving this medicine. You may get drowsy or dizzy. Do not drive, use machinery, or do anything that needs mental alertness until  you know how this medicine affects you. Do not become pregnant while taking this medicine or for 1 year after stopping it. Women should inform their doctor if they wish to become pregnant or think they might be pregnant. Men should not father a child while taking this medicine and for 4 months after stopping it. There is a potential for serious side effects to an unborn child. Talk to your health care professional or pharmacist for more information. Do not breast-feed an infant while taking  this medicine. This medicine may interfere with the ability to have a child. This medicine has caused ovarian failure in some women. This medicine has caused reduced sperm counts in some men. You should talk with your doctor or health care professional if you are concerned about your fertility. If you are going to have surgery, tell your doctor or health care professional that you have taken this medicine. What side effects may I notice from receiving this medicine? Side effects that you should report to your doctor or health care professional as soon as possible: -allergic reactions like skin rash, itching or hives, swelling of the face, lips, or tongue -low blood counts - this medicine may decrease the number of white blood cells, red blood cells and platelets. You may be at increased risk for infections and bleeding. -signs of infection - fever or chills, cough, sore throat, pain or difficulty passing urine -signs of decreased platelets or bleeding - bruising, pinpoint red spots on the skin, black, tarry stools, blood in the urine -signs of decreased red blood cells - unusually weak or tired, fainting spells, lightheadedness -breathing problems -dark urine -dizziness -palpitations -swelling of the ankles, feet, hands -trouble passing urine or change in the amount of urine -weight gain -yellowing of the eyes or skin Side effects that usually do not require medical attention (report to your doctor or health care professional if they continue or are bothersome): -changes in nail or skin color -hair loss -missed menstrual periods -mouth sores -nausea, vomiting This list may not describe all possible side effects. Call your doctor for medical advice about side effects. You may report side effects to FDA at 1-800-FDA-1088. Where should I keep my medicine? This drug is given in a hospital or clinic and will not be stored at home. NOTE: This sheet is a summary. It may not cover all possible  information. If you have questions about this medicine, talk to your doctor, pharmacist, or health care provider.  2018 Elsevier/Gold Standard (2012-08-31 16:22:58)  Pegfilgrastim injection What is this medicine? PEGFILGRASTIM (PEG fil gra stim) is a long-acting granulocyte colony-stimulating factor that stimulates the growth of neutrophils, a type of white blood cell important in the body's fight against infection. It is used to reduce the incidence of fever and infection in patients with certain types of cancer who are receiving chemotherapy that affects the bone marrow, and to increase survival after being exposed to high doses of radiation. This medicine may be used for other purposes; ask your health care provider or pharmacist if you have questions. COMMON BRAND NAME(S): Neulasta What should I tell my health care provider before I take this medicine? They need to know if you have any of these conditions: -kidney disease -latex allergy -ongoing radiation therapy -sickle cell disease -skin reactions to acrylic adhesives (On-Body Injector only) -an unusual or allergic reaction to pegfilgrastim, filgrastim, other medicines, foods, dyes, or preservatives -pregnant or trying to get pregnant -breast-feeding How should I use this medicine? This medicine   is for injection under the skin. If you get this medicine at home, you will be taught how to prepare and give the pre-filled syringe or how to use the On-body Injector. Refer to the patient Instructions for Use for detailed instructions. Use exactly as directed. Tell your healthcare provider immediately if you suspect that the On-body Injector may not have performed as intended or if you suspect the use of the On-body Injector resulted in a missed or partial dose. It is important that you put your used needles and syringes in a special sharps container. Do not put them in a trash can. If you do not have a sharps container, call your pharmacist or  healthcare provider to get one. Talk to your pediatrician regarding the use of this medicine in children. While this drug may be prescribed for selected conditions, precautions do apply. Overdosage: If you think you have taken too much of this medicine contact a poison control center or emergency room at once. NOTE: This medicine is only for you. Do not share this medicine with others. What if I miss a dose? It is important not to miss your dose. Call your doctor or health care professional if you miss your dose. If you miss a dose due to an On-body Injector failure or leakage, a new dose should be administered as soon as possible using a single prefilled syringe for manual use. What may interact with this medicine? Interactions have not been studied. Give your health care provider a list of all the medicines, herbs, non-prescription drugs, or dietary supplements you use. Also tell them if you smoke, drink alcohol, or use illegal drugs. Some items may interact with your medicine. This list may not describe all possible interactions. Give your health care provider a list of all the medicines, herbs, non-prescription drugs, or dietary supplements you use. Also tell them if you smoke, drink alcohol, or use illegal drugs. Some items may interact with your medicine. What should I watch for while using this medicine? You may need blood work done while you are taking this medicine. If you are going to need a MRI, CT scan, or other procedure, tell your doctor that you are using this medicine (On-Body Injector only). What side effects may I notice from receiving this medicine? Side effects that you should report to your doctor or health care professional as soon as possible: -allergic reactions like skin rash, itching or hives, swelling of the face, lips, or tongue -dizziness -fever -pain, redness, or irritation at site where injected -pinpoint red spots on the skin -red or dark-brown urine -shortness of  breath or breathing problems -stomach or side pain, or pain at the shoulder -swelling -tiredness -trouble passing urine or change in the amount of urine Side effects that usually do not require medical attention (report to your doctor or health care professional if they continue or are bothersome): -bone pain -muscle pain This list may not describe all possible side effects. Call your doctor for medical advice about side effects. You may report side effects to FDA at 1-800-FDA-1088. Where should I keep my medicine? Keep out of the reach of children. Store pre-filled syringes in a refrigerator between 2 and 8 degrees C (36 and 46 degrees F). Do not freeze. Keep in carton to protect from light. Throw away this medicine if it is left out of the refrigerator for more than 48 hours. Throw away any unused medicine after the expiration date. NOTE: This sheet is a summary. It may not   cover all possible information. If you have questions about this medicine, talk to your doctor, pharmacist, or health care provider.  2018 Elsevier/Gold Standard (2016-10-13 12:58:03)      

## 2017-08-03 NOTE — Telephone Encounter (Signed)
Printed avs and calender per 10/4 los, and gave to patient. She also have [place] account.

## 2017-08-03 NOTE — Assessment & Plan Note (Signed)
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 Neoadjuvant chemotherapy with Adriamycin and Cytoxan dose dense 4 followed by Taxol weekly 12 2. Followed by breast conserving surgery with sentinel lymph node study + targeted axillary dissection 3. Followed by adjuvant radiation therapy --------------------------------------------------------------------------- Current treatment: Cycle 1 day 1 neoadjuvant dose dense Adriamycin and Cytoxan Chemotherapy consent obtained [place] has been placed Labs are reviewed Antiemetics were reviewed Echocardiogram 07/27/2017: EF 60-65%  Return to clinic in one week for toxicity check

## 2017-08-03 NOTE — Progress Notes (Signed)
Patient tolerated treatment well. Patient complained of nasal burning at the end of Cytoxan. Patient voiced relief of nasal burning 28mins post Cytoxan completion. Neulasta and Chemo discharge instructions reviewed verbally and printed; patient's questions answered. No concerns on exit.

## 2017-08-04 ENCOUNTER — Encounter: Payer: Self-pay | Admitting: Physical Therapy

## 2017-08-04 ENCOUNTER — Ambulatory Visit: Payer: 59 | Admitting: Physical Therapy

## 2017-08-04 DIAGNOSIS — C50411 Malignant neoplasm of upper-outer quadrant of right female breast: Secondary | ICD-10-CM | POA: Diagnosis not present

## 2017-08-04 DIAGNOSIS — M25511 Pain in right shoulder: Secondary | ICD-10-CM

## 2017-08-04 DIAGNOSIS — Z171 Estrogen receptor negative status [ER-]: Principal | ICD-10-CM

## 2017-08-04 DIAGNOSIS — R293 Abnormal posture: Secondary | ICD-10-CM

## 2017-08-04 DIAGNOSIS — M25611 Stiffness of right shoulder, not elsewhere classified: Secondary | ICD-10-CM

## 2017-08-04 NOTE — Therapy (Signed)
[facility] [address] [place], [place], [address] Phone: [phone]   Fax:  [phone]  Physical Therapy Treatment  Patient Details  Name: Kara Crawford MRN: [id number] Date of Birth: 1966/07/26 Referring Provider: Dr. Alphonsa Overall  Encounter Date: 08/04/2017      PT End of Session - 08/04/17 0948    Visit Number 4   Number of Visits 8   Date for PT Re-Evaluation 08/23/17   PT Start Time 0947  Pt late, worked her in   PT Stop Time 1017   PT Time Calculation (min) 30 min   Activity Tolerance Patient tolerated treatment well   Behavior During Therapy [facility] for tasks assessed/performed      Past Medical History:  Diagnosis Date  . Anemia    anemia prior to uterine emboliztion procedure.  . Breast cancer ([place])   . Diabetes mellitus without complication ([place])    type 2  . Family history of breast cancer   . Family history of ovarian cancer   . Family history of prostate cancer   . Fibroids   . Headache   . Heart murmur   . Hyperlipidemia   . Hypertension     Past Surgical History:  Procedure Laterality Date  . BREATH TEK H PYLORI N/A 04/17/2014   Procedure: BREATH TEK H PYLORI;  Surgeon: Shann Medal, MD;  Location: [facility];  Service: General;  Laterality: N/A;  . CESAREAN SECTION     x3- normal development  . ESOPHAGOGASTRODUODENOSCOPY N/A 08/20/2015   Procedure: ESOPHAGOGASTRODUODENOSCOPY (EGD);  Surgeon: Alphonsa Overall, MD;  Location: [facility];  Service: General;  Laterality: N/A;  . EXPLORATORY LAPAROTOMY  05/13/2015  . GASTRIC ROUX-EN-Y N/A 10/06/2014   Procedure: LAPAROSCOPIC ROUX-EN-Y GASTRIC BYPASS WITH UPPER ENDOSCOPY;  Surgeon: Pedro Earls, MD;  [facility]: [facility];  Service: General;  Laterality: N/A;  . LAPAROSCOPY N/A 05/12/2015   Procedure: LAPAROSCOPY REPAIR OF PERFORATED BOWEL;  Surgeon: Alphonsa Overall, MD;  Location: [place];  Service: General;  Laterality: N/A;  . PORTACATH  PLACEMENT N/A 08/02/2017   Procedure: INSERTION PORT-A-CATH;  Surgeon: Alphonsa Overall, MD;  Location: [place];  Service: General;  Laterality: N/A;  . UTERINE ARTERY EMBOLIZATION  08-16-2010    There were no vitals filed for this visit.      Subjective Assessment - 08/04/17 0949    Subjective My shoulder pain is "98%" better. I had my first chemo yesterday. Reports only mild nauesa this AM.    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 It measures 1 cm and is located in the upper outer quadrant with a Ki67 of 90%. She had 3 abnormal looking axillary nodes on ultrasound and 1 was biopsied and found to be positive. Her right shoulder pain has been present for 3-4 weeks for unknown reasons. She had a cortisone injection in her shoulder which she reports did not help her pain.   Currently in Pain? No/denies   Multiple Pain Sites No                         [facility] Adult PT Treatment/Exercise - 08/04/17 0001      Shoulder Exercises: Seated   External Rotation Strengthening;Both;20 reps;Theraband   Theraband Level (Shoulder External Rotation) Level 1 (Yellow)     Shoulder Exercises: Standing   Flexion --  Wall ladder 8x with PTA inhibiting  the upper trap: 15   Extension Strengthening;Both;Theraband   Theraband Level (Shoulder Extension) Level 1 (Yellow)  2x 15   Row Strengthening;Both;Theraband   Theraband Level (Shoulder Row) Level 2 (Red)  2 x15     Shoulder Exercises: Pulleys   Flexion 3 minutes   ABduction 3 minutes     Shoulder Exercises: ROM/Strengthening   Other ROM/Strengthening Exercises Lat pull 2 plates 2x 15  Instructed pt to look for this machine at the gym and try     Shoulder Exercises: Stretch   Internal Rotation Stretch --  Behind the back stretch x 5 min with shot breaks. Added HEP     Iontophoresis   Type of Iontophoresis Dexamethasone  #3 skin  intact   Location Anterior Rt shoulder   Dose 1 ml   Time 6 hr wear  Pt verbalized understanding, skin intact                PT Education - 08/04/17 0959    Education provided Yes   Education Details Yellow band Ext Rotation for HEP: pt has a band, Stretching behind the back   Person(s) Educated Patient   Methods Explanation;Demonstration   Comprehension Verbalized understanding;Returned demonstration             PT Long Term Goals - 08/04/17 1022      PT LONG TERM GOAL #1   Title Patient will be independent in a home exercise program to improve shoulder ROM.   Time 4   Period Weeks   Status On-going   Target Date 08/23/17     PT LONG TERM GOAL #2   Title Increase right shoulder abduction to >/= 160 degrees for increased ease of reaching and to obtain required radiation positioning.   Time 4   Period Weeks   Status On-going   Target Date 08/23/17     PT LONG TERM GOAL #3   Title Increase right shoulder active flexion to >/= 144 degrees for increased ease of reaching   Time 4   Period Weeks   Status New   Target Date 08/23/17     PT LONG TERM GOAL #4   Title Patient will report >/= 50% decrease in right shoulder pain to tolerate daily tasks with greater ease.   Time 4   Period Weeks   Status Achieved  98%   Target Date 08/23/17         Breast Clinic Goals - 07/26/17 1125      Patient will be able to verbalize understanding of pertinent lymphedema risk reduction practices relevant to her diagnosis specifically related to skin care.   Time 1   Period Days   Status Achieved     Patient will be able to return demonstrate and/or verbalize understanding of the post-op home exercise program related to regaining shoulder range of motion.   Time 1   Period Days   Status Achieved     Patient will be able to verbalize understanding of the importance of attending the postoperative After Breast Cancer Class for further lymphedema risk reduction education  and therapeutic exercise.   Time 1   Period Days   Status Achieved          Long Term Clinic Goals - 07/26/17 1126      CC Long Term Goal  #1   Title Patient will be independent in a home exercise program to improve shoulder ROM.   Time 4   Period Weeks   Status New  CC Long Term Goal  #2   Title Increase right shoulder active flexion to >/= 144 degrees for increased ease of reaching.   Time 4   Period Weeks   Status New     CC Long Term Goal  #3   Title Increase right shoulder abduction to >/= 160 degrees for increased ease of reaching and to obtain required radiation positioning.   Time 4   Period Weeks   Status New     CC Long Term Goal  #4   Title Patient will report >/= 50% decrease in right shoulder pain to tolerate daily tasks with greater ease.   Time 4   Period Weeks   Status New            Plan - 08/04/17 0949    Clinical Impression Statement Today pt reports her Rt shoulder pain is 98% better. She feels the ionto patches have helped reduced the local pain adn she feels the stretches and strengtheniing exercises have helped her use it correctly during the day. She received her 3rd patch today. and had some exercises added to her HEP. She verbalized that next week she feels like wrapping it up as she is really getting into her chemo at this time.    Rehab Potential Good   Clinical Impairments Affecting Rehab Potential Cancer diagnosis   PT Frequency 3x / week   PT Duration 4 weeks   PT Treatment/Interventions Patient/family education;Passive range of motion;Manual techniques;Dry needling;Taping;Therapeutic exercise;Therapeutic activities;ADLs/Self Care Home Management;Moist Heat;Cryotherapy;Iontophoresis '4mg'$ /ml Dexamethasone   PT Next Visit Plan Ionto 4, measure AROM for goals, begin to finalize HEP   Consulted and Agree with Plan of Care Patient      Patient will benefit from skilled therapeutic intervention in order to improve the following deficits  and impairments:  Decreased range of motion, Impaired UE functional use, Pain, Decreased knowledge of precautions, Postural dysfunction  Visit Diagnosis: Carcinoma of upper-outer quadrant of right breast in female, estrogen receptor negative (HCC)  Abnormal posture  Acute pain of right shoulder  Stiffness of right shoulder, not elsewhere classified     Problem List Patient Active Problem List   Diagnosis Date Noted  . Family history of breast cancer   . Family history of ovarian cancer   . Family history of prostate cancer   . Malignant neoplasm of upper-outer quadrant of right breast in female, estrogen receptor negative ([place]) 07/25/2017  . Perforated bowel ([place]) 05/13/2015  . Free intraperitoneal air 05/12/2015  . Submucosal lesion of esophagus 10/06/2014  . 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 Diabetes ([place]) 09/24/2014  . Morbid obesity ([place]) 03/20/2014    Jilliane Kazanjian, PTA 08/04/2017, 10:33 AM   [facility] [address], [place], [place], [address] Phone: [phone]   Fax:  [phone]  Name: Keisha Amer MRN: [id number] Date of Birth: [birth year]

## 2017-08-07 ENCOUNTER — Ambulatory Visit: Payer: 59 | Admitting: Physical Therapy

## 2017-08-08 ENCOUNTER — Ambulatory Visit
Admission: RE | Admit: 2017-08-08 | Discharge: 2017-08-08 | Disposition: A | Discharge status: Home / Self Care | Payer: 59 | Source: Ambulatory Visit | Attending: Surgery | Admitting: Surgery

## 2017-08-08 DIAGNOSIS — C50911 Malignant neoplasm of unspecified site of right female breast: Secondary | ICD-10-CM

## 2017-08-08 DIAGNOSIS — D242 Benign neoplasm of left breast: Secondary | ICD-10-CM | POA: Diagnosis not present

## 2017-08-08 DIAGNOSIS — N6489 Other specified disorders of breast: Secondary | ICD-10-CM | POA: Diagnosis not present

## 2017-08-08 MED ORDER — GADOBENATE DIMEGLUMINE 529 MG/ML IV SOLN
17.0000 mL | Freq: Once | INTRAVENOUS | Status: AC | PRN
  Administered 2017-08-08: 17 mL via INTRAVENOUS

## 2017-08-09 ENCOUNTER — Ambulatory Visit: Payer: 59 | Admitting: Physical Therapy

## 2017-08-09 ENCOUNTER — Encounter: Payer: Self-pay | Admitting: Physical Therapy

## 2017-08-09 DIAGNOSIS — C50411 Malignant neoplasm of upper-outer quadrant of right female breast: Secondary | ICD-10-CM

## 2017-08-09 DIAGNOSIS — M25511 Pain in right shoulder: Secondary | ICD-10-CM

## 2017-08-09 DIAGNOSIS — R293 Abnormal posture: Secondary | ICD-10-CM

## 2017-08-09 DIAGNOSIS — M25611 Stiffness of right shoulder, not elsewhere classified: Secondary | ICD-10-CM

## 2017-08-09 DIAGNOSIS — Z171 Estrogen receptor negative status [ER-]: Principal | ICD-10-CM

## 2017-08-09 NOTE — Patient Instructions (Signed)
New stretches for home to take the shoulder to 100% motion.  1. Behind the back stretch: gently clasp the hands together behind you. Breathe slowly 3-4 x relaxing the front of your shoulder on each exhale. Do this a few times a day.   2. Doorway stretch: Slide your RT hand up the side of the door frame/overhead, until you feel a stretch in your shoulder. Hold 3 breaths then repeat 2x day.   3: Countertop stretch: Hold onto the counter or sink, and walk back putting your head between your arms and lean back a little. Hold 3-4 breaths again and do 3x. Do this 2x day.

## 2017-08-09 NOTE — Therapy (Signed)
[facility] [address] [place], [place], [address] Phone: [phone]   Fax:  [phone]  Physical Therapy Treatment  Patient Details  Name: Kara Crawford MRN: [id number] Date of Birth: 1966/02/14 Referring Provider: Dr. Alphonsa Overall  Encounter Date: 08/09/2017      PT End of Session - 08/09/17 0938    Visit Number 5   Number of Visits 8   Date for PT Re-Evaluation 08/23/17   PT Start Time 0937   PT Stop Time 1015   PT Time Calculation (min) 38 min   Activity Tolerance Patient tolerated treatment well   Behavior During Therapy [facility] for tasks assessed/performed      Past Medical History:  Diagnosis Date  . Anemia    anemia prior to uterine emboliztion procedure.  . Breast cancer ([place])   . Diabetes mellitus without complication ([place])    type 2  . Family history of breast cancer   . Family history of ovarian cancer   . Family history of prostate cancer   . Fibroids   . Headache   . Heart murmur   . Hyperlipidemia   . Hypertension     Past Surgical History:  Procedure Laterality Date  . BREATH TEK H PYLORI N/A 04/17/2014   Procedure: BREATH TEK H PYLORI;  Surgeon: Shann Medal, MD;  Location: [facility];  Service: General;  Laterality: N/A;  . CESAREAN SECTION     x3- normal development  . ESOPHAGOGASTRODUODENOSCOPY N/A 08/20/2015   Procedure: ESOPHAGOGASTRODUODENOSCOPY (EGD);  Surgeon: Alphonsa Overall, MD;  Location: [facility];  Service: General;  Laterality: N/A;  . EXPLORATORY LAPAROTOMY  05/13/2015  . GASTRIC ROUX-EN-Y N/A 10/06/2014   Procedure: LAPAROSCOPIC ROUX-EN-Y GASTRIC BYPASS WITH UPPER ENDOSCOPY;  Surgeon: Pedro Earls, MD;  [facility];  Service: General;  Laterality: N/A;  . LAPAROSCOPY N/A 05/12/2015   Procedure: LAPAROSCOPY REPAIR OF PERFORATED BOWEL;  Surgeon: Alphonsa Overall, MD;  Location: [place];  Service: General;  Laterality: N/A;  . PORTACATH PLACEMENT N/A 08/02/2017    Procedure: INSERTION PORT-A-CATH;  Surgeon: Alphonsa Overall, MD;  Location: [place];  Service: General;  Laterality: N/A;  . UTERINE ARTERY EMBOLIZATION  08-16-2010    There were no vitals filed for this visit.      Subjective Assessment - 08/09/17 0939    Subjective Still doing well. Sleeping at night can be difficult.    Currently in Pain? No/denies   Multiple Pain Sites No                         [facility] Adult PT Treatment/Exercise - 08/09/17 0001      Shoulder Exercises: Seated   External Rotation Strengthening;Both;20 reps;Theraband   Theraband Level (Shoulder External Rotation) Level 1 (Yellow)     Shoulder Exercises: Standing   Flexion --  Wall ladder 8x with PTA inhibiting the upper trap:#17, 1#    Extension Strengthening;Both;Theraband   Theraband Level (Shoulder Extension) Level 1 (Yellow)  2x 15   Row Strengthening;Both;Theraband   Theraband Level (Shoulder Row) Level 2 (Red)  2 x15     Shoulder Exercises: Pulleys   Flexion 3 minutes  Concurrent review of status   ABduction 3 minutes     Shoulder Exercises: Stretch   Internal Rotation Stretch --  Pulleys for behind the back: 3x10   Internal Rotation Stretch Limitations Hands clasped behind the back 3 breaths 3x   Other Shoulder Stretches Counter  top stretch 3x 30 sec for shld flex   Other Shoulder Stretches Standing doorway overhead stretch for end range flexion.     Iontophoresis   Type of Iontophoresis Dexamethasone  #4 skin intact   Location Anterior Rt shoulder   Dose 1 ml   Time 6 hr wear  Pt verbalized understanding, skin intact                PT Education - 08/09/17 1014    Education provided Yes   Education Details End range stretching for HEP   Person(s) Educated Patient   Methods Explanation;Demonstration;Tactile cues;Verbal cues;Handout   Comprehension Verbalized understanding;Returned demonstration             PT Long Term Goals - 08/09/17  1001      PT LONG TERM GOAL #1   Title Patient will be independent in a home exercise program to improve shoulder ROM.   Time 4   Period Weeks   Status Achieved         Breast Clinic Goals - 07/26/17 1125      Patient will be able to verbalize understanding of pertinent lymphedema risk reduction practices relevant to her diagnosis specifically related to skin care.   Time 1   Period Days   Status Achieved     Patient will be able to return demonstrate and/or verbalize understanding of the post-op home exercise program related to regaining shoulder range of motion.   Time 1   Period Days   Status Achieved     Patient will be able to verbalize understanding of the importance of attending the postoperative After Breast Cancer Class for further lymphedema risk reduction education and therapeutic exercise.   Time 1   Period Days   Status Achieved          Long Term Clinic Goals - 07/26/17 1126      CC Long Term Goal  #1   Title Patient will be independent in a home exercise program to improve shoulder ROM.   Time 4   Period Weeks   Status New     CC Long Term Goal  #2   Title Increase right shoulder active flexion to >/= 144 degrees for increased ease of reaching.   Time 4   Period Weeks   Status New     CC Long Term Goal  #3   Title Increase right shoulder abduction to >/= 160 degrees for increased ease of reaching and to obtain required radiation positioning.   Time 4   Period Weeks   Status New     CC Long Term Goal  #4   Title Patient will report >/= 50% decrease in right shoulder pain to tolerate daily tasks with greater ease.   Time 4   Period Weeks   Status New            Plan - 08/09/17 1275    Clinical Impression Statement Pt reports today she is doing well with some limitations in the extreme/or end ranges of overhead and behind her back.  We worked on this during her session as well as added this to HEP. Pt continues to do well with the ionto  patch.    Clinical Impairments Affecting Rehab Potential Cancer diagnosis   PT Frequency 3x / week   PT Duration 4 weeks   PT Treatment/Interventions Patient/family education;Passive range of motion;Manual techniques;Dry needling;Taping;Therapeutic exercise;Therapeutic activities;ADLs/Self Care Home Management;Moist Heat;Cryotherapy;Iontophoresis 4mg /ml Dexamethasone   PT Next Visit Plan Ionto #  5, pt would like to discharge on next appt. Measure next, ROM & MMT   Consulted and Agree with Plan of Care Patient      Patient will benefit from skilled therapeutic intervention in order to improve the following deficits and impairments:  Decreased range of motion, Impaired UE functional use, Pain, Decreased knowledge of precautions, Postural dysfunction  Visit Diagnosis: Carcinoma of upper-outer quadrant of right breast in female, estrogen receptor negative (HCC)  Abnormal posture  Acute pain of right shoulder  Stiffness of right shoulder, not elsewhere classified     Problem List Patient Active Problem List   Diagnosis Date Noted  . Family history of breast cancer   . Family history of ovarian cancer   . Family history of prostate cancer   . Malignant neoplasm of upper-outer quadrant of right breast in female, estrogen receptor negative ([place]) 07/25/2017  . Perforated bowel ([place]) 05/13/2015  . Free intraperitoneal air 05/12/2015  . Submucosal lesion of esophagus 10/06/2014  . 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 Diabetes ([place]) 09/24/2014  . Morbid obesity ([place]) 03/20/2014    Ahnika Hannibal, PTA 08/09/2017, 11:08 AM  [facility] [address], [place], [place], [address] Phone: [phone]   Fax:  [phone]  Name: Kara Crawford MRN: [id number] Date of Birth: 02/03/1966

## 2017-08-10 ENCOUNTER — Encounter: Payer: Self-pay | Admitting: Hematology and Oncology

## 2017-08-10 ENCOUNTER — Ambulatory Visit (HOSPITAL_BASED_OUTPATIENT_CLINIC_OR_DEPARTMENT_OTHER): Payer: 59 | Admitting: Hematology and Oncology

## 2017-08-10 ENCOUNTER — Other Ambulatory Visit (HOSPITAL_BASED_OUTPATIENT_CLINIC_OR_DEPARTMENT_OTHER): Payer: 59

## 2017-08-10 DIAGNOSIS — C50411 Malignant neoplasm of upper-outer quadrant of right female breast: Secondary | ICD-10-CM

## 2017-08-10 DIAGNOSIS — Z171 Estrogen receptor negative status [ER-]: Secondary | ICD-10-CM | POA: Diagnosis not present

## 2017-08-10 LAB — CBC WITH DIFFERENTIAL/PLATELET
BASO%: 0 % (ref 0.0–2.0)
Basophils Absolute: 0 10*3/uL (ref 0.0–0.1)
EOS%: 4.7 % (ref 0.0–7.0)
Eosinophils Absolute: 0 10*3/uL (ref 0.0–0.5)
HCT: 33.8 % — ABNORMAL LOW (ref 34.8–46.6)
HGB: 11.3 g/dL — ABNORMAL LOW (ref 11.6–15.9)
LYMPH#: 0.8 10*3/uL — AB (ref 0.9–3.3)
LYMPH%: 80.9 % — AB (ref 14.0–49.7)
MCH: 30.7 pg (ref 25.1–34.0)
MCHC: 33.4 g/dL (ref 31.5–36.0)
MCV: 92 fL (ref 79.5–101.0)
MONO#: 0 10*3/uL — ABNORMAL LOW (ref 0.1–0.9)
MONO%: 3.6 % (ref 0.0–14.0)
NEUT#: 0.1 10*3/uL — CL (ref 1.5–6.5)
NEUT%: 10.8 % — AB (ref 38.4–76.8)
Platelets: 199 10*3/uL (ref 145–400)
RBC: 3.67 10*6/uL — ABNORMAL LOW (ref 3.70–5.45)
RDW: 12.5 % (ref 11.2–14.5)
WBC: 1 10*3/uL — ABNORMAL LOW (ref 3.9–10.3)

## 2017-08-10 LAB — COMPREHENSIVE METABOLIC PANEL
ALBUMIN: 3.9 g/dL (ref 3.5–5.0)
ALK PHOS: 64 U/L (ref 40–150)
ALT: 14 U/L (ref 0–55)
ANION GAP: 9 meq/L (ref 3–11)
AST: 13 U/L (ref 5–34)
BILIRUBIN TOTAL: 0.5 mg/dL (ref 0.20–1.20)
BUN: 11.4 mg/dL (ref 7.0–26.0)
CALCIUM: 10 mg/dL (ref 8.4–10.4)
CO2: 28 mEq/L (ref 22–29)
Chloride: 101 mEq/L (ref 98–109)
Creatinine: 0.8 mg/dL (ref 0.6–1.1)
GLUCOSE: 94 mg/dL (ref 70–140)
Potassium: 4.2 mEq/L (ref 3.5–5.1)
SODIUM: 138 meq/L (ref 136–145)
TOTAL PROTEIN: 7.4 g/dL (ref 6.4–8.3)

## 2017-08-10 NOTE — Assessment & Plan Note (Signed)
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 Neoadjuvant chemotherapy with Adriamycin and Cytoxan dose dense 4 followed by Taxol weekly 12 2. Followed by breast conserving surgery with sentinel lymph node study + targeted axillary dissection 3. Followed by adjuvant radiation therapy --------------------------------------------------------------------------- Current treatment: Cycle 1 day 8 neoadjuvant dose dense Adriamycin and Cytoxan  Chemotherapy toxicities: 1 ANC 0.1:  neutropenic precautions and instructed her to wear a mask and crowded places. 2. constipation discussed her to use Colace and MiraLAX as needed 3. Fatigue related to chemotherapy that lasted 2 days   Patient did not report any nausea Return to clinic in one week for cycle 2

## 2017-08-10 NOTE — Progress Notes (Signed)
Patient Care Team: Cari Caraway, MD as PCP - General (Family Medicine) Christophe Louis, MD as Consulting Physician (Obstetrics and Gynecology) Alphonsa Overall, MD as Consulting Physician (General Surgery) Nicholas Lose, MD as Consulting Physician (Hematology and Oncology) Eppie Gibson, MD as Attending Physician (Radiation Oncology)  DIAGNOSIS:  Encounter Diagnosis  Name Primary?  . Malignant neoplasm of upper-outer quadrant of right breast in female, estrogen receptor negative (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 Today is cycle 1 day 8. She had tolerated cycle 1 extremely well. She did not have any nausea vomiting. She did however have constipation that was relieved with over-the-counter laxatives. She denies any fevers or chills. She had fatigue that lasted a couple of days after chemotherapy.  REVIEW OF SYSTEMS:   Constitutional: Denies fevers, chills or abnormal weight loss Eyes: Denies blurriness of vision Ears, nose, mouth, throat, and face: Denies mucositis or sore throat Respiratory: Denies cough, dyspnea or wheezes Cardiovascular: Denies palpitation, chest discomfort Gastrointestinal:   Denies nausea, heartburn or change in bowel habits Skin: Denies abnormal skin rashes Lymphatics: Denies new lymphadenopathy or easy bruising Neurological:Denies numbness, tingling or new weaknesses Behavioral/Psych: Mood is stable, no new changes  Extremities: No lower extremity edema All other systems were reviewed with the patient and are negative.  I have reviewed the past medical history, past surgical history, social history and family history with the patient and they are unchanged from previous note.  ALLERGIES:  has No Known Allergies.  MEDICATIONS:  Current Outpatient Prescriptions  Medication Sig Dispense Refill  . acetaminophen (TYLENOL) 325 MG tablet Take 650 mg by mouth as needed.    Marland Kitchen amLODipine (NORVASC) 10 MG tablet Take 10 mg by mouth every morning.     Marland Kitchen atorvastatin (LIPITOR) 80 MG tablet Take 80 mg by mouth every morning.     . Calcium Carbonate-Vitamin D (CALCIUM 500/D PO) Take 2 tablets by mouth daily.    . Cholecalciferol (VITAMIN D3 PO) Take by mouth.    . Cyanocobalamin (VITAMIN B-12 PO) Take 1 tablet by mouth every morning.    Marland Kitchen dexamethasone (DECADRON) 4 MG tablet Take 2 tablets by mouth once a day on the day after chemotherapy and then take 2 tablets two times a day for 2 days. Take with food. 30 tablet 1  . HYDROcodone-acetaminophen (NORCO/VICODIN) 5-325 MG tablet Take 1-2 tablets by mouth every 6 (six) hours as needed for moderate pain. 15 tablet 0  . lidocaine-prilocaine (EMLA) cream Apply to affected area once 30 g 3  . lisinopril-hydrochlorothiazide (PRINZIDE,ZESTORETIC) 20-12.5 MG per tablet Take 2 tablets by mouth daily.     Marland Kitchen LORazepam (ATIVAN) 0.5 MG tablet Take 1 tablet (0.5 mg total) by mouth  every 6 (six) hours as needed (Nausea or vomiting). 30 tablet 0  . Melatonin 10 MG CAPS Take 1 tablet by mouth at bedtime.    . metoprolol succinate (TOPROL-XL) 25 MG 24 hr tablet Take 25 mg by mouth every morning.     . ondansetron (ZOFRAN) 8 MG tablet Take 1  tablet (8 mg total) by mouth 2 (two) times daily as needed. Start on the third day after chemotherapy. 30 tablet 1  . pantoprazole (PROTONIX) 40 MG tablet Take 40 mg by mouth daily.    . prochlorperazine (COMPAZINE) 10 MG tablet TAKE 1 TABLET(10 MG) BY MOUTH EVERY 6 HOURS AS NEEDED FOR NAUSEA OR VOMITING 385 tablet 1  . sitaGLIPtin-metformin (JANUMET) 50-500 MG per tablet Take 1 tablet by mouth 2 (two) times daily with a meal.      No current facility-administered medications for this visit.    Facility-Administered Medications Ordered in Other Visits  Medication Dose Route Frequency Provider Last Rate Last Dose  . sodium chloride flush (NS) 0.9 % injection 10 mL  10 mL Intravenous PRN Nicholas Lose, MD   10 mL at 08/03/17 1516    PHYSICAL EXAMINATION: ECOG PERFORMANCE STATUS: 1 - Symptomatic but completely ambulatory  Vitals:   08/10/17 1008  BP: 128/68  Pulse: 81  Resp: 19  Temp: 98.1 F (36.7 C)  SpO2: 100%   Filed Weights   08/10/17 1008  Weight: 183 lb 8 oz (83.2 kg)    GENERAL:alert, no distress and comfortable SKIN: skin color, texture, turgor are normal, no rashes or significant lesions EYES: normal, Conjunctiva are pink and non-injected, sclera clear OROPHARYNX:no exudate, no erythema and lips, buccal mucosa, and tongue normal  NECK: supple, thyroid normal size, non-tender, without nodularity LYMPH:  no palpable lymphadenopathy in the cervical, axillary or inguinal LUNGS: clear to auscultation and percussion with normal breathing effort HEART: regular rate & rhythm and no murmurs and no lower extremity edema ABDOMEN:abdomen soft, non-tender and normal bowel sounds MUSCULOSKELETAL:no cyanosis of digits and no clubbing  NEURO: alert & oriented x 3 with fluent speech, no focal motor/sensory deficits EXTREMITIES: No lower extremity edema   LABORATORY DATA:  I have reviewed the data as listed   Chemistry      Component Value Date/Time   NA 138 08/10/2017 0943   K  4.2 08/10/2017 0943   CL 102 05/15/2015 0407   CO2 28 08/10/2017 0943   BUN 11.4 08/10/2017 0943   CREATININE 0.8 08/10/2017 0943      Component Value Date/Time   CALCIUM 10.0 08/10/2017 0943   ALKPHOS 64 08/10/2017 0943   AST 13 08/10/2017 0943   ALT 14 08/10/2017 0943   BILITOT 0.50 08/10/2017 0943       Lab Results  Component Value Date   WBC 1.0 (L) 08/10/2017   HGB 11.3 (L) 08/10/2017   HCT 33.8 (L) 08/10/2017   MCV 92.0 08/10/2017   PLT 199 08/10/2017   NEUTROABS 0.1 (LL) 08/10/2017    ASSESSMENT & PLAN:  Malignant neoplasm of upper-outer quadrant of right breast in female, estrogen receptor negative ([place]) 07/17/2017: Right upper outer quadrant painful palpable right breast mass by ultrasound measured 1 cm with 3 abnormal axillary lymph nodes 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 Neoadjuvant chemotherapy with Adriamycin and Cytoxan dose dense 4 followed by Taxol weekly 12 2. Followed by breast conserving surgery with  sentinel lymph node study + targeted axillary dissection 3. Followed by adjuvant radiation therapy --------------------------------------------------------------------------- Current treatment: Cycle 1 day 8 neoadjuvant dose dense Adriamycin and Cytoxan  Chemotherapy toxicities: 1 ANC 0.1:  neutropenic precautions and instructed her to wear a mask and crowded places. 2. constipation discussed her to use Colace and MiraLAX as needed 3. Fatigue related to chemotherapy that lasted 2 days   Patient did not report any nausea Return to clinic in one week for cycle 2    I spent 25 minutes talking to the patient of which more than half was spent in counseling and coordination of care.  No orders of the defined types were placed in this encounter.  The patient has a good understanding of the overall plan. she agrees with it. she  will call with any problems that may develop before the next visit here.   Rulon Eisenmenger, MD 08/10/17

## 2017-08-11 ENCOUNTER — Ambulatory Visit: Payer: 59 | Admitting: Physical Therapy

## 2017-08-11 ENCOUNTER — Encounter: Payer: Self-pay | Admitting: Physical Therapy

## 2017-08-11 DIAGNOSIS — M25511 Pain in right shoulder: Secondary | ICD-10-CM

## 2017-08-11 DIAGNOSIS — R293 Abnormal posture: Secondary | ICD-10-CM

## 2017-08-11 DIAGNOSIS — C50411 Malignant neoplasm of upper-outer quadrant of right female breast: Secondary | ICD-10-CM | POA: Diagnosis not present

## 2017-08-11 DIAGNOSIS — Z171 Estrogen receptor negative status [ER-]: Principal | ICD-10-CM

## 2017-08-11 DIAGNOSIS — M25611 Stiffness of right shoulder, not elsewhere classified: Secondary | ICD-10-CM

## 2017-08-11 NOTE — Therapy (Addendum)
[facility] [address], [place], [place], [address] Phone: [phone]   Fax:  [phone]  Physical Therapy Treatment  Patient Details  Name: Channah Godeaux MRN: [id number] Date of Birth: [birth year] Referring Provider: Dr. Alphonsa Overall  Encounter Date: 08/11/2017      PT End of Session - 08/11/17 1022    Visit Number 6   Number of Visits 8   Date for PT Re-Evaluation 08/23/17   PT Start Time 1020   PT Stop Time 1055   PT Time Calculation (min) 35 min   Activity Tolerance Patient tolerated treatment well   Behavior During Therapy [facility] for tasks assessed/performed      Past Medical History:  Diagnosis Date  . Anemia    anemia prior to uterine emboliztion procedure.  . Breast cancer ([place])   . Diabetes mellitus without complication ([place])    type 2  . Family history of breast cancer   . Family history of ovarian cancer   . Family history of prostate cancer   . Fibroids   . Headache   . Heart murmur   . Hyperlipidemia   . Hypertension     Past Surgical History:  Procedure Laterality Date  . BREATH TEK H PYLORI N/A 04/17/2014   Procedure: BREATH TEK H PYLORI;  Surgeon: Shann Medal, MD;  Location: [facility];  Service: General;  Laterality: N/A;  . CESAREAN SECTION     x3- normal development  . ESOPHAGOGASTRODUODENOSCOPY N/A 08/20/2015   Procedure: ESOPHAGOGASTRODUODENOSCOPY (EGD);  Surgeon: Alphonsa Overall, MD;  Location: [facility];  Service: General;  Laterality: N/A;  . EXPLORATORY LAPAROTOMY  05/13/2015  . GASTRIC ROUX-EN-Y N/A 10/06/2014   Procedure: LAPAROSCOPIC ROUX-EN-Y GASTRIC BYPASS WITH UPPER ENDOSCOPY;  Surgeon: Pedro Earls, MD;  Location: [facility];  Service: General;  Laterality: N/A;  . LAPAROSCOPY N/A 05/12/2015   Procedure: LAPAROSCOPY REPAIR OF PERFORATED BOWEL;  Surgeon: Alphonsa Overall, MD;  Location: [facility];  Service: General;  Laterality: N/A;  . PORTACATH PLACEMENT N/A 08/02/2017    Procedure: INSERTION PORT-A-CATH;  Surgeon: Alphonsa Overall, MD;  Location: [facility];  Service: General;  Laterality: N/A;  . UTERINE ARTERY EMBOLIZATION  08-16-2010    There were no vitals filed for this visit.      Subjective Assessment - 08/11/17 1024    Subjective I feel "99%" better at this time. I am working on reaching behind my back better.    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 It measures 1 cm and is located in the upper outer quadrant with a Ki67 of 90%. She had 3 abnormal looking axillary nodes on ultrasound and 1 was biopsied and found to be positive. Her right shoulder pain has been present for 3-4 weeks for unknown reasons. She had a cortisone injection in her shoulder which she reports did not help her pain.   Currently in Pain? No/denies   Multiple Pain Sites No            [facility] PT Assessment - 08/11/17 0001      AROM   Right Shoulder Flexion 146 Degrees   Right Shoulder ABduction 150 Degrees   Right Shoulder Internal Rotation --  Behind the back 3 inch diff bt Rt & LT     Strength   Right Shoulder Flexion 5/5   Right Shoulder ABduction 4+/5   Right Shoulder External Rotation 4+/5  [facility] Adult PT Treatment/Exercise - 08/11/17 0001      Shoulder Exercises: Standing   External Rotation Strengthening;Both;Theraband   Theraband Level (Shoulder External Rotation) Level 1 (Yellow)  3x10   Extension Strengthening;Both;Theraband   Theraband Level (Shoulder Extension) Level 1 (Yellow)  2x 15   Row Strengthening;Both;Theraband   Theraband Level (Shoulder Row) Level 2 (Red)  2 x15     Shoulder Exercises: Pulleys   Flexion 3 minutes  Concurrent review of status   ABduction 3 minutes     Shoulder Exercises: Stretch   Internal Rotation Stretch Limitations Hands clasped behind the back 3 breaths 5x   Other Shoulder Stretches  Counter top stretch 3x 30 sec for shld flex   Other Shoulder Stretches Standing doorway overhead stretch for end range flexion.     Iontophoresis   Type of Iontophoresis Dexamethasone  #4 skin intact   Location Anterior Rt shoulder   Dose 1 ml   Time 6 hr wear  Pt verbalized understanding, skin intact                PT Education - 08/11/17 1054    Education provided Yes   Education Details Where to purchase over the door pulley if she feels she needs one.   Person(s) Educated Patient   Methods Explanation   Comprehension Verbalized understanding             PT Long Term Goals - 08/11/17 1039      PT LONG TERM GOAL #1   Title Patient will be independent in a home exercise program to improve shoulder ROM.   Time 4   Period Weeks   Status Achieved     PT LONG TERM GOAL #2   Title Increase right shoulder abduction to >/= 160 degrees for increased ease of reaching and to obtain required radiation positioning.   Time 4   Period Weeks   Status Not Met  150 degrees     PT LONG TERM GOAL #3   Title Increase right shoulder active flexion to >/= 144 degrees for increased ease of reaching   Time 4   Period Weeks   Status Achieved     PT LONG TERM GOAL #4   Title Patient will report >/= 50% decrease in right shoulder pain to tolerate daily tasks with greater ease.   Time 4   Period Weeks   Status Achieved         Breast Clinic Goals - 07/26/17 1125      Patient will be able to verbalize understanding of pertinent lymphedema risk reduction practices relevant to her diagnosis specifically related to skin care.   Time 1   Period Days   Status Achieved     Patient will be able to return demonstrate and/or verbalize understanding of the post-op home exercise program related to regaining shoulder range of motion.   Time 1   Period Days   Status Achieved     Patient will be able to verbalize understanding of the importance of attending the postoperative After  Breast Cancer Class for further lymphedema risk reduction education and therapeutic exercise.   Time 1   Period Days   Status Achieved          Long Term Clinic Goals - 07/26/17 1126      CC Long Term Goal  #1   Title Patient will be independent in a home exercise program to improve shoulder ROM.   Time 4   Period Weeks  Status New     CC Long Term Goal  #2   Title Increase right shoulder active flexion to >/= 144 degrees for increased ease of reaching.   Time 4   Period Weeks   Status New     CC Long Term Goal  #3   Title Increase right shoulder abduction to >/= 160 degrees for increased ease of reaching and to obtain required radiation positioning.   Time 4   Period Weeks   Status New     CC Long Term Goal  #4   Title Patient will report >/= 50% decrease in right shoulder pain to tolerate daily tasks with greater ease.   Time 4   Period Weeks   Status New            Plan - 08/11/17 1022    Clinical Impression Statement Today pt reports she feels overall 98% improved. She essentially has no pain with ADLs, only some tightness/soreness stretching behind her back. AROM improved in all directions since eval, meeting goals except for abduction where pt measured 150 degrees vs goal of 160 degrees. She is going to the gym for exercise in addition to her HEP for shoulder AROM and scapular stabilization. Pt is pleased with her progress and would like today to be her last session.     Rehab Potential Good   Clinical Impairments Affecting Rehab Potential Cancer diagnosis   PT Frequency 3x / week   PT Duration 4 weeks   PT Treatment/Interventions Patient/family education;Passive range of motion;Manual techniques;Dry needling;Taping;Therapeutic exercise;Therapeutic activities;ADLs/Self Care Home Management;Moist Heat;Cryotherapy;Iontophoresis '4mg'$ /ml Dexamethasone   PT Next Visit Plan Discharge to HEP, pt is satisfied with her progress.    Consulted and Agree with Plan of Care  --      Patient will benefit from skilled therapeutic intervention in order to improve the following deficits and impairments:  Decreased range of motion, Impaired UE functional use, Pain, Decreased knowledge of precautions, Postural dysfunction  Visit Diagnosis: Carcinoma of upper-outer quadrant of right breast in female, estrogen receptor negative ([facility])  Abnormal posture  Acute pain of right shoulder  Stiffness of right shoulder, not elsewhere classified     Problem List Patient Active Problem List   Diagnosis Date Noted  . Family history of breast cancer   . Family history of ovarian cancer   . Family history of prostate cancer   . Malignant neoplasm of upper-outer quadrant of right breast in female, estrogen receptor negative ([place]) 07/25/2017  . Perforated bowel ([place]) 05/13/2015  . Free intraperitoneal air 05/12/2015  . Submucosal lesion of esophagus 10/06/2014  . 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 Diabetes ([place]) 09/24/2014  . Morbid obesity ([place]) 03/20/2014    Myrene Galas, PTA 08/11/17 11:04 AM  [facility] [address], [place], [place], [address] Phone: [phone]   Fax:  [phone]  Name: Sumeya Yontz MRN: [id number] Date of Birth: [birth year]  PHYSICAL THERAPY DISCHARGE SUMMARY  Visits from Start of Care: 6  Current functional level related to goals / functional outcomes: Function restored in shoulder and pt reports 98% improvement. ROM is functional. Goals met except abduction is 150 degrees instead of 160 but that is still much improved and functional.   Remaining deficits: None   Education / Equipment: Home exercises Plan: Patient agrees to discharge.  Patient goals were partially met. Patient is being discharged due to meeting the stated rehab  goals.  ?????    Annia Friendly, [place] 08/14/17 2:35 PM

## 2017-08-16 NOTE — Assessment & Plan Note (Addendum)
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 Neoadjuvant chemotherapy with Adriamycin and Cytoxan dose dense 4 followed by Taxol weekly 12 2. Followed by breast conserving surgery with sentinel lymph node study + targeted axillary dissection 3. Followed by adjuvant radiation therapy --------------------------------------------------------------------------- Current treatment: Cycle 2 day 1 neoadjuvant dose dense Adriamycin and Cytoxan  Chemotherapy toxicities: 1. Neutropenia: Neulasta support, to wear mask and avoid crowds between days 6-10 following chemotherapy 2. constipation  Colace and MiraLAX PRN.  I encouraged her to get this today. 3. Fatigue  Lonnie shared with me that her son is having a hard time dealing with his mom's new diagnosis.  I discussed this in detail with her, and gave her resources to [place].    Return to clinic in two weeks for cycle 3

## 2017-08-16 NOTE — Progress Notes (Signed)
[facility] Follow up:    Kara Caraway, MD [place] [address]   DIAGNOSIS: Cancer Staging Malignant neoplasm of upper-outer quadrant of right breast in female, estrogen receptor negative ([place]) Staging form: Breast, AJCC 8th Edition - Clinical stage from 07/26/2017: Stage IIB (cT1c, cN1, cM0, G3, ER: Negative, PR: Negative, HER2: Negative) - Unsigned Staging comments: Staged at breast conference on 9.26.18   SUMMARY OF ONCOLOGIC HISTORY:   Malignant neoplasm of upper-outer quadrant of right breast in female, estrogen receptor negative ([place])   07/17/2017 Initial Diagnosis    Right upper outer quadrant painful palpable right breast mass by ultrasound measured 1 cm with 3 abnormal axillary lymph nodes 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 female returns for evaluation prior to receiving cycle 2 of Adriamycin and Cytoxan with Neulasta support. Her port is occasionally painful, particularly with positioning.  She has pain under her right arm, her husband seems to think it is in her head.  She did have some tenderness, however no lumps or bumps.    Patient Active Problem List   Diagnosis Date Noted  . Family history of breast cancer   . Family history of ovarian cancer   . Family history of prostate cancer   . Malignant neoplasm of upper-outer quadrant of right breast in female, estrogen receptor negative ([place]) 07/25/2017  . Perforated bowel ([place]) 05/13/2015  . Free intraperitoneal air 05/12/2015  . Submucosal lesion of esophagus 10/06/2014  . 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  Diabetes ([place]) 09/24/2014  . Morbid obesity ([place]) 03/20/2014    has No Known Allergies.  MEDICAL HISTORY: Past Medical History:  Diagnosis Date  . Anemia    anemia prior to uterine emboliztion procedure.  . Breast cancer ([place])   . Diabetes mellitus without complication ([place])    type 2  . Family history of breast cancer   . Family history of ovarian cancer   . Family history of prostate cancer   . Fibroids   . Headache   . Heart murmur   . Hyperlipidemia   . Hypertension     SURGICAL HISTORY: Past Surgical History:  Procedure Laterality Date  . BREATH TEK H PYLORI N/A 04/17/2014   Procedure: BREATH TEK H PYLORI;  Surgeon: Shann Medal, MD;  Location: [facility];  Service: General;  Laterality: N/A;  . CESAREAN SECTION     x3- normal development  . ESOPHAGOGASTRODUODENOSCOPY N/A 08/20/2015   Procedure: ESOPHAGOGASTRODUODENOSCOPY (EGD);  Surgeon: Alphonsa Overall, MD;  Location: [facility];  Service: General;  Laterality: N/A;  . EXPLORATORY LAPAROTOMY  05/13/2015  . GASTRIC ROUX-EN-Y N/A 10/06/2014   Procedure: LAPAROSCOPIC ROUX-EN-Y GASTRIC BYPASS WITH UPPER ENDOSCOPY;  Surgeon: Pedro Earls, MD;  Location: [facility];  Service: General;  Laterality: N/A;  . LAPAROSCOPY N/A 05/12/2015   Procedure: LAPAROSCOPY REPAIR OF PERFORATED BOWEL;  Surgeon: Alphonsa Overall, MD;  Location: [facility];  Service: General;  Laterality: N/A;  . PORTACATH PLACEMENT N/A 08/02/2017   Procedure: INSERTION PORT-A-CATH;  Surgeon: Alphonsa Overall, MD;  Location: [facility];  Service:  General;  Laterality: N/A;  . UTERINE ARTERY EMBOLIZATION  08-16-2010    SOCIAL HISTORY: Social History   Social History  . Marital status: Married    Spouse name: N/A  . Number of children: N/A  . Years of education: N/A   Occupational History  . Not on file.   Social History Main Topics  . Smoking status: Never Smoker  . Smokeless tobacco: Never Used  . Alcohol use 0.6 oz/week    1 Glasses of wine  per week     Comment: social -rare occ.  . Drug use: No  . Sexual activity: Yes   Other Topics Concern  . Not on file   Social History Narrative  . No narrative on file    FAMILY HISTORY: Family History  Problem Relation Age of Onset  . Diabetes Mother   . Hypertension Mother   . Stroke Mother   . Diabetes Father   . Hypertension Father   . Prostate cancer Father 89       found at stage II  . Prostate cancer Paternal Uncle 11  . Breast cancer Paternal Aunt 14  . Cancer Maternal Aunt 55       type unknown  . Ovarian cancer Paternal Grandmother 52  . Cancer Other        type unk- dx in 40's  . Breast cancer Cousin 65    Review of Systems  Constitutional: Negative for appetite change, chills, fatigue, fever and unexpected weight change.  HENT:   Negative for hearing loss and lump/mass.   Eyes: Negative for eye problems and icterus.  Respiratory: Negative for chest tightness, cough and shortness of breath.   Cardiovascular: Negative for chest pain, leg swelling and palpitations.  Gastrointestinal: Negative for abdominal distention and abdominal pain.  Endocrine: Negative for hot flashes.  Genitourinary: Negative for difficulty urinating.   Musculoskeletal: Negative for arthralgias.  Skin: Negative for itching and rash.  Neurological: Negative for dizziness, extremity weakness, headaches and numbness.  Hematological: Negative for adenopathy. Does not bruise/bleed easily.  Psychiatric/Behavioral: Negative for depression. The patient is not nervous/anxious.       PHYSICAL EXAMINATION  ECOG PERFORMANCE STATUS: 0 - Asymptomatic  Vitals:   08/17/17 1142  BP: 117/70  Pulse: 80  Resp: 20  Temp: 98.4 F (36.9 C)  SpO2: 100%    Physical Exam  Constitutional: She is well-developed, well-nourished, and in no distress.  HENT:  Head: Normocephalic and atraumatic.  Mouth/Throat: Oropharynx is clear and moist. No oropharyngeal exudate.  Eyes: Pupils are equal, round,  and reactive to light. No scleral icterus.  Neck: Neck supple.  Cardiovascular: Normal rate, regular rhythm and normal heart sounds.   Pulmonary/Chest: [place]ort normal and breath sounds normal.  Abdominal: Soft. Bowel sounds are normal. She exhibits no distension and no mass. There is no tenderness. There is no rebound and no guarding.  Musculoskeletal: She exhibits no edema.  Lymphadenopathy:    She has no cervical adenopathy.  Neurological: She is alert.  Skin:  Axilla with tiny 12m circular area of erythema, likely irritated hair follicle.  Psychiatric: Mood and affect normal.    LABORATORY DATA:  CBC    Component Value Date/Time   WBC 9.5 08/17/2017 1112   WBC 6.2 05/15/2015 0407   RBC 3.58 (L) 08/17/2017 1112   RBC 3.64 (L) 05/15/2015 0407   HGB 11.3 (L) 08/17/2017 1112   HCT 33.0 (L) 08/17/2017 1112   PLT 249 08/17/2017 1112   MCV 92.2 08/17/2017  1112   MCH 31.6 08/17/2017 1112   MCH 31.3 05/15/2015 0407   MCHC 34.2 08/17/2017 1112   MCHC 33.2 05/15/2015 0407   RDW 13.0 08/17/2017 1112   LYMPHSABS 1.7 08/17/2017 1112   MONOABS 0.7 08/17/2017 1112   EOSABS 0.0 08/17/2017 1112   BASOSABS 0.1 08/17/2017 1112    CMP     Component Value Date/Time   NA 141 08/17/2017 1112   K 3.8 08/17/2017 1112   CL 102 05/15/2015 0407   CO2 28 08/17/2017 1112   GLUCOSE 95 08/17/2017 1112   BUN 13.3 08/17/2017 1112   CREATININE 1.0 08/17/2017 1112   CALCIUM 9.9 08/17/2017 1112   PROT 7.4 08/17/2017 1112   ALBUMIN 4.1 08/17/2017 1112   AST 18 08/17/2017 1112   ALT 16 08/17/2017 1112   ALKPHOS 69 08/17/2017 1112   BILITOT 0.29 08/17/2017 1112   GFRNONAA >60 05/15/2015 0407   GFRAA >60 05/15/2015 0407     ASSESSMENT and  PLAN:   Malignant neoplasm of upper-outer quadrant of right breast in female, estrogen receptor negative ([place]) 07/17/2017: Right upper outer quadrant painful palpable right breast mass by ultrasound measured 1 cm with 3 abnormal axillary lymph nodes biopsy  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 Neoadjuvant chemotherapy with Adriamycin and Cytoxan dose dense 4 followed by Taxol weekly 12 2. Followed by breast conserving surgery with sentinel lymph node study + targeted axillary dissection 3. Followed by adjuvant radiation therapy --------------------------------------------------------------------------- Current treatment: Cycle 2 day 1 neoadjuvant dose dense Adriamycin and Cytoxan  Chemotherapy toxicities: 1. Neutropenia: Neulasta support, to wear mask and avoid crowds between days 6-10 following chemotherapy 2. constipation  Colace and MiraLAX PRN.  I encouraged her to get this today. 3. Fatigue  Kara Crawford shared with me that her son is having a hard time dealing with his mom's new diagnosis.  I discussed this in detail with her, and gave her resources to [place].    Return to clinic in two weeks for cycle 3      All questions were answered. The patient knows to call the clinic with any problems, questions or concerns. We can certainly see the patient much sooner if necessary.  A total of (30) minutes of face-to-face time was spent with this patient with greater than 50% of that time in counseling and care-coordination.  This note was electronically signed. Scot Dock, NP 08/17/2017

## 2017-08-17 ENCOUNTER — Ambulatory Visit: Payer: 59

## 2017-08-17 ENCOUNTER — Encounter: Payer: Self-pay | Admitting: Adult Health

## 2017-08-17 ENCOUNTER — Other Ambulatory Visit (HOSPITAL_BASED_OUTPATIENT_CLINIC_OR_DEPARTMENT_OTHER): Payer: 59

## 2017-08-17 ENCOUNTER — Ambulatory Visit (HOSPITAL_BASED_OUTPATIENT_CLINIC_OR_DEPARTMENT_OTHER): Payer: 59

## 2017-08-17 ENCOUNTER — Telehealth: Payer: Self-pay

## 2017-08-17 ENCOUNTER — Ambulatory Visit (HOSPITAL_BASED_OUTPATIENT_CLINIC_OR_DEPARTMENT_OTHER): Payer: 59 | Admitting: Adult Health

## 2017-08-17 VITALS — BP 117/70 | HR 80 | Temp 98.4°F | Resp 20 | Ht 61.0 in | Wt 182.2 lb

## 2017-08-17 DIAGNOSIS — Z171 Estrogen receptor negative status [ER-]: Secondary | ICD-10-CM | POA: Diagnosis not present

## 2017-08-17 DIAGNOSIS — D701 Agranulocytosis secondary to cancer chemotherapy: Secondary | ICD-10-CM | POA: Diagnosis not present

## 2017-08-17 DIAGNOSIS — Z5111 Encounter for antineoplastic chemotherapy: Secondary | ICD-10-CM

## 2017-08-17 DIAGNOSIS — C50411 Malignant neoplasm of upper-outer quadrant of right female breast: Secondary | ICD-10-CM

## 2017-08-17 DIAGNOSIS — R530 Neoplastic (malignant) related fatigue: Secondary | ICD-10-CM | POA: Diagnosis not present

## 2017-08-17 DIAGNOSIS — K5900 Constipation, unspecified: Secondary | ICD-10-CM

## 2017-08-17 LAB — COMPREHENSIVE METABOLIC PANEL
ALK PHOS: 69 U/L (ref 40–150)
ALT: 16 U/L (ref 0–55)
ANION GAP: 10 meq/L (ref 3–11)
AST: 18 U/L (ref 5–34)
Albumin: 4.1 g/dL (ref 3.5–5.0)
BUN: 13.3 mg/dL (ref 7.0–26.0)
CALCIUM: 9.9 mg/dL (ref 8.4–10.4)
CHLORIDE: 103 meq/L (ref 98–109)
CO2: 28 mEq/L (ref 22–29)
Creatinine: 1 mg/dL (ref 0.6–1.1)
EGFR: >60 mL/min/{1.73_m2} (ref >60)
Glucose: 95 mg/dl (ref 70–140)
POTASSIUM: 3.8 meq/L (ref 3.5–5.1)
Sodium: 141 mEq/L (ref 136–145)
Total Bilirubin: 0.29 mg/dL (ref 0.20–1.20)
Total Protein: 7.4 g/dL (ref 6.4–8.3)

## 2017-08-17 LAB — CBC WITH DIFFERENTIAL/PLATELET
BASO%: 0.8 % (ref 0.0–2.0)
BASOS ABS: 0.1 10*3/uL (ref 0.0–0.1)
EOS%: 0.3 % (ref 0.0–7.0)
Eosinophils Absolute: 0 10*3/uL (ref 0.0–0.5)
HEMATOCRIT: 33 % — AB (ref 34.8–46.6)
HGB: 11.3 g/dL — ABNORMAL LOW (ref 11.6–15.9)
LYMPH#: 1.7 10*3/uL (ref 0.9–3.3)
LYMPH%: 17.4 % (ref 14.0–49.7)
MCH: 31.6 pg (ref 25.1–34.0)
MCHC: 34.2 g/dL (ref 31.5–36.0)
MCV: 92.2 fL (ref 79.5–101.0)
MONO#: 0.7 10*3/uL (ref 0.1–0.9)
MONO%: 7 % (ref 0.0–14.0)
NEUT#: 7.1 10*3/uL — ABNORMAL HIGH (ref 1.5–6.5)
NEUT%: 74.5 % (ref 38.4–76.8)
PLATELETS: 249 10*3/uL (ref 145–400)
RBC: 3.58 10*6/uL — ABNORMAL LOW (ref 3.70–5.45)
RDW: 13 % (ref 11.2–14.5)
WBC: 9.5 10*3/uL (ref 3.9–10.3)

## 2017-08-17 MED ORDER — PEGFILGRASTIM 6 MG/0.6ML ~~LOC~~ PSKT
6.0000 mg | PREFILLED_SYRINGE | Freq: Once | SUBCUTANEOUS | Status: AC
  Administered 2017-08-17: 6 mg via SUBCUTANEOUS
  Filled 2017-08-17: qty 0.6

## 2017-08-17 MED ORDER — SODIUM CHLORIDE 0.9 % IV SOLN
Freq: Once | INTRAVENOUS | Status: AC
  Administered 2017-08-17: 14:00:00 via INTRAVENOUS
  Filled 2017-08-17: qty 5

## 2017-08-17 MED ORDER — SODIUM CHLORIDE 0.9 % IV SOLN
Freq: Once | INTRAVENOUS | Status: AC
  Administered 2017-08-17: 13:00:00 via INTRAVENOUS

## 2017-08-17 MED ORDER — PALONOSETRON HCL INJECTION 0.25 MG/5ML
INTRAVENOUS | Status: AC
  Filled 2017-08-17: qty 5

## 2017-08-17 MED ORDER — DOXORUBICIN HCL CHEMO IV INJECTION 2 MG/ML
50.0000 mg/m2 | Freq: Once | INTRAVENOUS | Status: AC
  Administered 2017-08-17: 94 mg via INTRAVENOUS
  Filled 2017-08-17: qty 47

## 2017-08-17 MED ORDER — SODIUM CHLORIDE 0.9% FLUSH
10.0000 mL | INTRAVENOUS | Status: AC | PRN
  Administered 2017-08-17 (×2): 10 mL via INTRAVENOUS
  Filled 2017-08-17: qty 10

## 2017-08-17 MED ORDER — SODIUM CHLORIDE 0.9 % IV SOLN
500.0000 mg/m2 | Freq: Once | INTRAVENOUS | Status: AC
  Administered 2017-08-17: 940 mg via INTRAVENOUS
  Filled 2017-08-17: qty 47

## 2017-08-17 MED ORDER — PALONOSETRON HCL INJECTION 0.25 MG/5ML
0.2500 mg | Freq: Once | INTRAVENOUS | Status: AC
  Administered 2017-08-17: 0.25 mg via INTRAVENOUS

## 2017-08-17 MED ORDER — SODIUM CHLORIDE 0.9% FLUSH
10.0000 mL | INTRAVENOUS | Status: DC | PRN
  Filled 2017-08-17: qty 10

## 2017-08-17 MED ORDER — HEPARIN SOD (PORK) LOCK FLUSH 100 UNIT/ML IV SOLN
500.0000 [IU] | Freq: Once | INTRAVENOUS | Status: AC | PRN
  Administered 2017-08-17: 500 [IU]
  Filled 2017-08-17: qty 5

## 2017-08-17 NOTE — Patient Instructions (Signed)
[facility] Discharge Instructions for Patients Receiving Chemotherapy  Today you received the following chemotherapy agents Adriamycin and Cytoxan  To help prevent nausea and vomiting after your treatment, we encourage you to take your nausea medication as directed   If you develop nausea and vomiting that is not controlled by your nausea medication, call the clinic.   BELOW ARE SYMPTOMS THAT SHOULD BE REPORTED IMMEDIATELY:  *FEVER GREATER THAN 100.5 F  *CHILLS WITH OR WITHOUT FEVER  NAUSEA AND VOMITING THAT IS NOT CONTROLLED WITH YOUR NAUSEA MEDICATION  *UNUSUAL SHORTNESS OF BREATH  *UNUSUAL BRUISING OR BLEEDING  TENDERNESS IN MOUTH AND THROAT WITH OR WITHOUT PRESENCE OF ULCERS  *URINARY PROBLEMS  *BOWEL PROBLEMS  UNUSUAL RASH Items with * indicate a potential emergency and should be followed up as soon as possible.  Feel free to call the clinic should you have any questions or concerns. The clinic phone number is [phone].  Please show the [place] at check-in to the Emergency Department and triage nurse.    Pegfilgrastim (Neulasta) injection What is this medicine? PEGFILGRASTIM (PEG fil gra stim) is a long-acting granulocyte colony-stimulating factor that stimulates the growth of neutrophils, a type of white blood cell important in the body's fight against infection. It is used to reduce the incidence of fever and infection in patients with certain types of cancer who are receiving chemotherapy that affects the bone marrow, and to increase survival after being exposed to high doses of radiation. This medicine may be used for other purposes; ask your health care provider or pharmacist if you have questions. COMMON BRAND NAME(S): Neulasta What should I tell my health care provider before I take this medicine? They need to know if you have any of these conditions: -kidney disease -latex allergy -ongoing radiation therapy -sickle cell  disease -skin reactions to acrylic adhesives (On-Body Injector only) -an unusual or allergic reaction to pegfilgrastim, filgrastim, other medicines, foods, dyes, or preservatives -pregnant or trying to get pregnant -breast-feeding How should I use this medicine? This medicine is for injection under the skin. If you get this medicine at home, you will be taught how to prepare and give the pre-filled syringe or how to use the On-body Injector. Refer to the patient Instructions for Use for detailed instructions. Use exactly as directed. Tell your healthcare provider immediately if you suspect that the On-body Injector may not have performed as intended or if you suspect the use of the On-body Injector resulted in a missed or partial dose. It is important that you put your used needles and syringes in a special sharps container. Do not put them in a trash can. If you do not have a sharps container, call your pharmacist or healthcare provider to get one. Talk to your pediatrician regarding the use of this medicine in children. While this drug may be prescribed for selected conditions, precautions do apply. Overdosage: If you think you have taken too much of this medicine contact a poison control center or emergency room at once. NOTE: This medicine is only for you. Do not share this medicine with others. What if I miss a dose? It is important not to miss your dose. Call your doctor or health care professional if you miss your dose. If you miss a dose due to an On-body Injector failure or leakage, a new dose should be administered as soon as possible using a single prefilled syringe for manual use. What may interact with this medicine? Interactions have not  been studied. Give your health care provider a list of all the medicines, herbs, non-prescription drugs, or dietary supplements you use. Also tell them if you smoke, drink alcohol, or use illegal drugs. Some items may interact with your medicine. This  list may not describe all possible interactions. Give your health care provider a list of all the medicines, herbs, non-prescription drugs, or dietary supplements you use. Also tell them if you smoke, drink alcohol, or use illegal drugs. Some items may interact with your medicine. What should I watch for while using this medicine? You may need blood work done while you are taking this medicine. If you are going to need a MRI, CT scan, or other procedure, tell your doctor that you are using this medicine (On-Body Injector only). What side effects may I notice from receiving this medicine? Side effects that you should report to your doctor or health care professional as soon as possible: -allergic reactions like skin rash, itching or hives, swelling of the face, lips, or tongue -dizziness -fever -pain, redness, or irritation at site where injected -pinpoint red spots on the skin -red or dark-brown urine -shortness of breath or breathing problems -stomach or side pain, or pain at the shoulder -swelling -tiredness -trouble passing urine or change in the amount of urine Side effects that usually do not require medical attention (report to your doctor or health care professional if they continue or are bothersome): -bone pain -muscle pain This list may not describe all possible side effects. Call your doctor for medical advice about side effects. You may report side effects to FDA at 1-800-FDA-1088. Where should I keep my medicine? Keep out of the reach of children. Store pre-filled syringes in a refrigerator between 2 and 8 degrees C (36 and 46 degrees F). Do not freeze. Keep in carton to protect from light. Throw away this medicine if it is left out of the refrigerator for more than 48 hours. Throw away any unused medicine after the expiration date. NOTE: This sheet is a summary. It may not cover all possible information. If you have questions about this medicine, talk to your doctor, pharmacist,  or health care provider.  2018 Elsevier/Gold Standard (2016-10-13 12:58:03)

## 2017-08-17 NOTE — Patient Instructions (Signed)
Implanted Port Home Guide An implanted port is a type of central line that is placed under the skin. Central lines are used to provide IV access when treatment or nutrition needs to be given through a person's veins. Implanted ports are used for long-term IV access. An implanted port may be placed because:  You need IV medicine that would be irritating to the small veins in your hands or arms.  You need long-term IV medicines, such as antibiotics.  You need IV nutrition for a long period.  You need frequent blood draws for lab tests.  You need dialysis.  Implanted ports are usually placed in the chest area, but they can also be placed in the upper arm, the abdomen, or the leg. An implanted port has two main parts:  Reservoir. The reservoir is round and will appear as a small, raised area under your skin. The reservoir is the part where a needle is inserted to give medicines or draw blood.  Catheter. The catheter is a thin, flexible tube that extends from the reservoir. The catheter is placed into a large vein. Medicine that is inserted into the reservoir goes into the catheter and then into the vein.  How will I care for my incision site? Do not get the incision site wet. Bathe or shower as directed by your health care provider. How is my port accessed? Special steps must be taken to access the port:  Before the port is accessed, a numbing cream can be placed on the skin. This helps numb the skin over the port site.  Your health care provider uses a sterile technique to access the port. ? Your health care provider must put on a mask and sterile gloves. ? The skin over your port is cleaned carefully with an antiseptic and allowed to dry. ? The port is gently pinched between sterile gloves, and a needle is inserted into the port.  Only "non-coring" port needles should be used to access the port. Once the port is accessed, a blood return should be checked. This helps ensure that the port  is in the vein and is not clogged.  If your port needs to remain accessed for a constant infusion, a clear (transparent) bandage will be placed over the needle site. The bandage and needle will need to be changed every week, or as directed by your health care provider.  Keep the bandage covering the needle clean and dry. Do not get it wet. Follow your health care provider's instructions on how to take a shower or bath while the port is accessed.  If your port does not need to stay accessed, no bandage is needed over the port.  What is flushing? Flushing helps keep the port from getting clogged. Follow your health care provider's instructions on how and when to flush the port. Ports are usually flushed with saline solution or a medicine called heparin. The need for flushing will depend on how the port is used.  If the port is used for intermittent medicines or blood draws, the port will need to be flushed: ? After medicines have been given. ? After blood has been drawn. ? As part of routine maintenance.  If a constant infusion is running, the port may not need to be flushed.  How long will my port stay implanted? The port can stay in for as long as your health care provider thinks it is needed. When it is time for the port to come out, surgery will be   done to remove it. The procedure is similar to the one performed when the port was put in. When should I seek immediate medical care? When you have an implanted port, you should seek immediate medical care if:  You notice a bad smell coming from the incision site.  You have swelling, redness, or drainage at the incision site.  You have more swelling or pain at the port site or the surrounding area.  You have a fever that is not controlled with medicine.  This information is not intended to replace advice given to you by your health care provider. Make sure you discuss any questions you have with your health care provider. Document  Released: 10/17/2005 Document Revised: 03/24/2016 Document Reviewed: 06/24/2013 Elsevier Interactive Patient Education  2017 Elsevier Inc.  

## 2017-08-17 NOTE — Telephone Encounter (Signed)
Pt called with question if she should put cream on her port for flush appt. Pt already arrived at [facility] when this RN took message off machine.

## 2017-08-18 ENCOUNTER — Telehealth: Payer: Self-pay | Admitting: Genetics

## 2017-08-18 ENCOUNTER — Encounter: Payer: Self-pay | Admitting: Genetics

## 2017-08-18 ENCOUNTER — Ambulatory Visit: Payer: Self-pay | Admitting: Genetics

## 2017-08-18 DIAGNOSIS — Z1501 Genetic susceptibility to malignant neoplasm of breast: Secondary | ICD-10-CM | POA: Insufficient documentation

## 2017-08-18 DIAGNOSIS — Z1509 Genetic susceptibility to other malignant neoplasm: Secondary | ICD-10-CM

## 2017-08-18 DIAGNOSIS — Z1379 Encounter for other screening for genetic and chromosomal anomalies: Secondary | ICD-10-CM | POA: Insufficient documentation

## 2017-08-18 DIAGNOSIS — Z8042 Family history of malignant neoplasm of prostate: Secondary | ICD-10-CM

## 2017-08-18 DIAGNOSIS — Z171 Estrogen receptor negative status [ER-]: Principal | ICD-10-CM

## 2017-08-18 DIAGNOSIS — C50411 Malignant neoplasm of upper-outer quadrant of right female breast: Secondary | ICD-10-CM

## 2017-08-18 DIAGNOSIS — Z8041 Family history of malignant neoplasm of ovary: Secondary | ICD-10-CM

## 2017-08-18 DIAGNOSIS — Z803 Family history of malignant neoplasm of breast: Secondary | ICD-10-CM

## 2017-08-18 NOTE — Progress Notes (Addendum)
GENETIC TEST RESULTS   Patient Name: Kara Crawford Patient Age: 51 y.o. Encounter Date: 08/18/2017  Referring Provider: Nicholas Lose, MD  Kara Crawford was seen in the [facility] on 07/27/2017 due to a personal and family history of cancer and concern regarding a hereditary predisposition to cancer in the family. Please refer to the prior Genetics clinic note for more information regarding Kara Crawford medical and family histories and our assessment at the time.   FAMILY HISTORY:  We obtained a detailed, 4-generation family history.  Significant diagnoses are listed below: Family History  Problem Relation Age of Onset  . Diabetes Mother   . Hypertension Mother   . Stroke Mother   . Diabetes Father   . Hypertension Father   . Prostate cancer Father 65       found at stage II  . Prostate cancer Paternal Uncle 69  . Breast cancer Paternal Aunt 15  . Cancer Maternal Aunt 55       type unknown  . Ovarian cancer Paternal Grandmother 85  . Cancer Other        type unk- dx in 40's  . Breast cancer Cousin 64   Kara Crawford has a 27 year-old daughter, a 86 year-old daughter, and a 39 year-old son with no history of cancer.  Kara Crawford has a 28 year-old sister with no history of cancer.  This sister has a son and a daughter with no history of cancer. Kara Crawford also has a brother who died at 76 due to an accident. He had a son who has no history of cancer.  Kara Crawford also has a 51 year-old paternal half-sister with no children or any history of cancer.   Kara Crawford' father was recently diagnosed with prostate cancer at 63.  She reports that it was stage 2.  Kara Crawford has 4 paternal aunts and 1 paternal uncle listed below: -1 paternal aunt was diagnosed with breast cancer at 73 and is now 20.  She has 1 daughter, but her health information is unknown.  -1 paternal uncle was diagnosed with prostate cancer at 48.  He has 4 children with no known history of cancer.  -3 paternal aunts  are in th eir 75's and 22's without any history of cancer.  Kara Crawford has several other paternal cousins, none with any known history of cancer. Kara Crawford' paternal grandfather is 67 with no history of cancer.  Kara Crawford' paternal grandmother died of ovarian cancer in her 48's.  This grandmother had a sister with cancer in her 42's, but the type is unknown.   Kara Crawford' mother is 49 with no history of cancer.  Kara Crawford has 69 maternal aunts and uncles.  One of these aunts was diagnosed with an unknown type of cancer in her 35's.  One maternal cousin was diagnosed with breast cancer in her 87's and is now 74.  [name] maternal grandparents died in their 56's/80's with no history of cancer.    Kara Crawford is unaware of previous family history of genetic testing for hereditary cancer risks. Patient's maternal ancestors are of African American descent, and paternal ancestors are of African American descent. There is no reported Ashkenazi Jewish ancestry. There is no known consanguinity.  Kara Crawford does report that her father and his sister married her mother and her mother's brother. Her parents are not related to each other, but she does have a double cousin' as a result.   GENETIC TESTING:  At the time of Kara Crawford visit, we recommended she pursue genetic testing of the Common Hereditary Cancer Panel genetic test. The genetic testing reported on 08/18/2017 through the Common Hereditary Cancer Panel offered by [facility] identified a single, heterozygous pathogenic gene mutation called BRCA2, [id number] (p.Asp1540Glyfs*6).       The Hereditary Gene Panel offered by [facility] includes sequencing and/or deletion duplication testing of the following 46 genes: APC, ATM, AXIN2, BARD1, BMPR1A, BRCA1, BRCA2, BRIP1, CDH1, CDKN2A (p14ARF), CDKN2A (p16INK4a), CHEK2, CTNNA1, DICER1, EPCAM (Deletion/duplication testing only), GREM1 (promoter region deletion/duplication testing only), KIT, MEN1,  MLH1, MSH2, MSH3, MSH6, MUTYH, NBN, NF1, NHTL1, PALB2, PDGFRA, PMS2, POLD1, POLE, PTEN, RAD50, RAD51C, RAD51D, SDHB, SDHC, SDHD, SMAD4, SMARCA4. STK11, TP53, TSC1, TSC2, and VHL.  The following genes were evaluated for sequence changes only: SDHA and HOXB13 c.251G>A variant only.  MEDICAL MANAGEMENT: Women who have a BRCA mutation have an increased risk for both breast and ovarian cancer.   As discussed with Kara Crawford, to reduce the risk for breast cancer, prophylactic bilateral mastectomy is the most effective option. However, for women who choose to keep their breasts, we recommend yearly mammograms, yearly breast MRI, twice-yearly clinical breast exams through a high-risk clinic, and monthly self-breast exams.  We discussed that there is an increased risk for a contralateral breast cancer to develop in women with BRCA2 mutations.    To reduce the risk for ovarian cancer, we recommend Kara Crawford  have a prophylactic bilateral salpingo-oophorectomy when childbearing is completed, if planned. We discussed that screening with CA-125 blood tests and transvaginal ultrasounds can be done twice per year. However, these tests have not been shown to detect ovarian cancer at an early stage.  Men with mutations in the BRCA2 gene are at an increased risk to develop prostate cancer.  It is recommended men with BRCA2 mutations have prostate cancer screening starting at age 51.  It is also recommended that men have clinical breast exams every year starting at age 48 and perform self breast exams at 38.    Individuals with a mutation in BRCA2 also have a somewhat increased risk for pancreatic cancer and melanoma. No specific screening guidelines exist for pancreatic cancer and melanoma, but screening may be individualized based on cancers observed in the family.  Kara Crawford does not report either of these types of cancer in her family history.      RISK REDUCTION: There are several things that can be offered to  individuals who are carriers for BRCA mutations that will reduce the risk for getting cancer.   The use of oral contraceptives can lower the risk for ovarian cancer, and, per case control studies, does not significantly increase the risk for breast cancer in BRCA patients. Case control studies have shown that oral contraceptives can lower the risk for ovarian cancer in women with BRCA mutations. Additionally, a more recent meta-analysis, including one corhort (n=3,181) and one case control study (1,096 cases and 2,878 controls) also showed an inverse correlation between ovarian cancer and ever having used oral contraceptives (OR, 0.58; 95% CI = 0.46-0.73). Studies on oral contraceptives and breast cancer have been conflicting, with some studies suggesting that there is not an increased risk for breast cancer in BRCA mutation carriers, while others suggest that there could be a risk. That said, two meta-analysis studies have shown that there is not an increased risk for breast cancer with oral contraceptive use in BRCA1 and BRCA2 carriers.   In individuals who have a prophylactic  bilateral salpino-oophorectomy (BSO), the risk for breast cancer is reduced by up to 50%. It has been reported that short term hormone replacement therapy in women undergoing prophylactic BSO does not negate the reduction of breast cancer risk associated with surgery (1.2018 NCCN guidelines).  Ms. Staff was very interested in having a BSO and did inquire about if this could be done with her breast surgery.  I informed her to ask her surgeon and oncology care team to see if this could be arranged or not.     FAMILY MEMBERS: It is important that all of Ms. Roessner's relatives (both men and women) know of the presence of this gene mutation. Site-specific genetic testing can sort out who in the family is at risk and who is not.   Ms. Kiddy children and siblings have a 50% chance to have inherited this mutation. We recommend  they have genetic testing for this same mutation, as identifying the presence of this mutation would allow them to also take advantage of risk-reducing measures. Her son is currently only 65, when he becomes an adult, we recommend genetic testing for him as well.   At this time we do not know weather this BRCA2 mutation came from Ms. Lozano' mother or father.  Based on the family history it is more likely it came from her father.  We recommended all relatives on both sides of the family have genetic testing for this mutation until it can be determined if it came from her father or mother's side.  We recommended starting with genetic testing in her father and paternal relatives.    Individuals with mutations in both copies of their BRCA2 genes have a condition called Fanconi Anemia type D1.  Ms. Philipson only has 1 BRCA2 mutation and therefore does not have this disease, but is a carrier for this condition.  Being a carrier for Fanconi anemia may carries reproductive risks.  If an individual with a BRCA2 mutation is making reproductive decisions, discussion with a genetics professional can be helpful to understand risks.    SUPPORT AND RESOURCES: If Ms. Meanor is interested in BRCA-specific information and support, there are two groups, Facing Our Risk (www.facingourrisk.com) and Bright Pink (www.brightpink.org) which some people have found useful. They provide opportunities to speak with other individuals from high-risk families. To locate genetic counselors in other cities, visit the website of the [employer] (ArtistMovie.se) and Secretary/administrator for a Social worker by zip code.  We encouraged Ms. Decelles to remain in contact with [place] on an annual basis so we can update her personal and family histories, and let her know of advances in cancer genetics that may benefit the family. Our contact number was provided. Ms. Zelenak questions were answered to her satisfaction today, and she knows she is  welcome to call anytime with additional questions.   Tana Felts, MS Genetic Counselor [email or web address] phone: [phone]

## 2017-08-18 NOTE — Telephone Encounter (Signed)
I revealed to Kara Crawford that her genetic testing was positive for a mutation in the BRCA2 gene.  I explained to her that this likely explains her current diagnosis of breast cancer and increases her risk to develop a 2nd breast cancer.  This may impact her decision for breast surgery.  I also recommended that she have her ovaries and fallopian tubes removed.  She asked if these surgeries could be done together, and I informed her that her surgeons may be able to answer weather that could be worked out logistically or not.  She requested I send these results to her GYN, Kara Crawford as well.    I explained that this has implications for her relatives and that we recommend her sister and daughters have genetic counseling and consider genetic testing for this gene mutation.  Her son is still under 3, but is recommended to have testing when he is an adult.  Based on the family history it appears that this mutation likely came from her father's side of the family, however we cannot be sure without other relatives doing genetic testing. I suggested we start by testing her father and her paternal relatives, however her maternal relatives would also be recommended to have genetic testing if this is not possible at this time.   I suggested Kara Crawford come in for a follow-up appointment with genetics to discuss the risks and her surgical/screening decisions further, at this appointment other relatives could also attend and have genetic testing if they would like to.  She was interested in coming in to discuss these results further, but wanted to tell her family and talk to her daughters first and see if they would be able to come in with her.  She will call back to schedule this appointment after talking with them.    I will send Kara Crawford some information about this gene mutation, and she will discuss this with her family.  If she or her family has any further questions she was encouraged to contact us.

## 2017-08-31 ENCOUNTER — Other Ambulatory Visit (HOSPITAL_BASED_OUTPATIENT_CLINIC_OR_DEPARTMENT_OTHER): Payer: 59

## 2017-08-31 ENCOUNTER — Telehealth: Payer: Self-pay | Admitting: Hematology and Oncology

## 2017-08-31 ENCOUNTER — Ambulatory Visit: Payer: 59

## 2017-08-31 ENCOUNTER — Other Ambulatory Visit: Payer: Self-pay

## 2017-08-31 ENCOUNTER — Ambulatory Visit (HOSPITAL_BASED_OUTPATIENT_CLINIC_OR_DEPARTMENT_OTHER): Payer: 59 | Admitting: Adult Health

## 2017-08-31 ENCOUNTER — Ambulatory Visit (HOSPITAL_BASED_OUTPATIENT_CLINIC_OR_DEPARTMENT_OTHER): Payer: 59

## 2017-08-31 ENCOUNTER — Encounter: Payer: Self-pay | Admitting: Hematology and Oncology

## 2017-08-31 DIAGNOSIS — C50411 Malignant neoplasm of upper-outer quadrant of right female breast: Secondary | ICD-10-CM

## 2017-08-31 DIAGNOSIS — Z5111 Encounter for antineoplastic chemotherapy: Secondary | ICD-10-CM

## 2017-08-31 DIAGNOSIS — Z171 Estrogen receptor negative status [ER-]: Principal | ICD-10-CM

## 2017-08-31 DIAGNOSIS — R530 Neoplastic (malignant) related fatigue: Secondary | ICD-10-CM | POA: Diagnosis not present

## 2017-08-31 DIAGNOSIS — Z1501 Genetic susceptibility to malignant neoplasm of breast: Secondary | ICD-10-CM

## 2017-08-31 LAB — CBC WITH DIFFERENTIAL/PLATELET
BASO%: 0.5 % (ref 0.0–2.0)
BASOS ABS: 0.1 10*3/uL (ref 0.0–0.1)
EOS ABS: 0.1 10*3/uL (ref 0.0–0.5)
EOS%: 0.5 % (ref 0.0–7.0)
HEMATOCRIT: 33.8 % — AB (ref 34.8–46.6)
HEMOGLOBIN: 11 g/dL — AB (ref 11.6–15.9)
LYMPH#: 1.8 10*3/uL (ref 0.9–3.3)
LYMPH%: 16 % (ref 14.0–49.7)
MCH: 30.6 pg (ref 25.1–34.0)
MCHC: 32.5 g/dL (ref 31.5–36.0)
MCV: 93.9 fL (ref 79.5–101.0)
MONO#: 0.6 10*3/uL (ref 0.1–0.9)
MONO%: 5.5 % (ref 0.0–14.0)
NEUT%: 77.5 % — ABNORMAL HIGH (ref 38.4–76.8)
NEUTROS ABS: 8.6 10*3/uL — AB (ref 1.5–6.5)
Platelets: 193 10*3/uL (ref 145–400)
RBC: 3.6 10*6/uL — ABNORMAL LOW (ref 3.70–5.45)
RDW: 13.9 % (ref 11.2–14.5)
WBC: 11.1 10*3/uL — AB (ref 3.9–10.3)

## 2017-08-31 LAB — COMPREHENSIVE METABOLIC PANEL
ALBUMIN: 4.1 g/dL (ref 3.5–5.0)
ALT: 17 U/L (ref 0–55)
AST: 16 U/L (ref 5–34)
Alkaline Phosphatase: 68 U/L (ref 40–150)
Anion Gap: 9 mEq/L (ref 3–11)
BUN: 14.9 mg/dL (ref 7.0–26.0)
CALCIUM: 9.9 mg/dL (ref 8.4–10.4)
CO2: 27 mEq/L (ref 22–29)
Chloride: 105 mEq/L (ref 98–109)
Creatinine: 0.9 mg/dL (ref 0.6–1.1)
GLUCOSE: 105 mg/dL (ref 70–140)
Potassium: 3.6 mEq/L (ref 3.5–5.1)
SODIUM: 142 meq/L (ref 136–145)
TOTAL PROTEIN: 7.3 g/dL (ref 6.4–8.3)
Total Bilirubin: 0.29 mg/dL (ref 0.20–1.20)

## 2017-08-31 MED ORDER — SODIUM CHLORIDE 0.9% FLUSH
10.0000 mL | INTRAVENOUS | Status: DC | PRN
  Administered 2017-08-31: 10 mL via INTRAVENOUS
  Filled 2017-08-31: qty 10

## 2017-08-31 MED ORDER — LORAZEPAM 0.5 MG PO TABS: 0.5000 mg | ORAL_TABLET | Freq: Four times a day (QID) | ORAL | 0 refills | Status: DC | PRN

## 2017-08-31 MED ORDER — SODIUM CHLORIDE 0.9% FLUSH
10.0000 mL | INTRAVENOUS | Status: DC | PRN
  Administered 2017-08-31: 10 mL
  Filled 2017-08-31: qty 10

## 2017-08-31 MED ORDER — SODIUM CHLORIDE 0.9 % IV SOLN
Freq: Once | INTRAVENOUS | Status: AC
  Administered 2017-08-31: 13:00:00 via INTRAVENOUS

## 2017-08-31 MED ORDER — PALONOSETRON HCL INJECTION 0.25 MG/5ML
0.2500 mg | Freq: Once | INTRAVENOUS | Status: AC
  Administered 2017-08-31: 0.25 mg via INTRAVENOUS

## 2017-08-31 MED ORDER — SODIUM CHLORIDE 0.9 % IV SOLN
Freq: Once | INTRAVENOUS | Status: AC
  Administered 2017-08-31: 13:00:00 via INTRAVENOUS
  Filled 2017-08-31: qty 5

## 2017-08-31 MED ORDER — PEGFILGRASTIM 6 MG/0.6ML ~~LOC~~ PSKT
6.0000 mg | PREFILLED_SYRINGE | Freq: Once | SUBCUTANEOUS | Status: AC
  Administered 2017-08-31: 6 mg via SUBCUTANEOUS
  Filled 2017-08-31: qty 0.6

## 2017-08-31 MED ORDER — HEPARIN SOD (PORK) LOCK FLUSH 100 UNIT/ML IV SOLN
500.0000 [IU] | Freq: Once | INTRAVENOUS | Status: AC | PRN
  Administered 2017-08-31: 500 [IU]
  Filled 2017-08-31: qty 5

## 2017-08-31 MED ORDER — DOXORUBICIN HCL CHEMO IV INJECTION 2 MG/ML
50.0000 mg/m2 | Freq: Once | INTRAVENOUS | Status: AC
  Administered 2017-08-31: 94 mg via INTRAVENOUS
  Filled 2017-08-31: qty 47

## 2017-08-31 MED ORDER — PALONOSETRON HCL INJECTION 0.25 MG/5ML
INTRAVENOUS | Status: AC
  Filled 2017-08-31: qty 5

## 2017-08-31 MED ORDER — SODIUM CHLORIDE 0.9 % IV SOLN
500.0000 mg/m2 | Freq: Once | INTRAVENOUS | Status: AC
  Administered 2017-08-31: 940 mg via INTRAVENOUS
  Filled 2017-08-31: qty 47

## 2017-08-31 NOTE — Telephone Encounter (Signed)
Gave patient avs and calendar with appt per 11/1 los.

## 2017-08-31 NOTE — Progress Notes (Addendum)
[facility] Follow up:    Cari Caraway, MD [place] [address]   DIAGNOSIS: Cancer Staging Malignant neoplasm of upper-outer quadrant of right breast in female, estrogen receptor negative ([place]) Staging form: Breast, AJCC 8th Edition - Clinical stage from 07/26/2017: Stage IIB (cT1c, cN1, cM0, G3, ER: Negative, PR: Negative, HER2: Negative) - Unsigned Staging comments: Staged at breast conference on 9.26.18   SUMMARY OF ONCOLOGIC HISTORY:   Malignant neoplasm of upper-outer quadrant of right breast in female, estrogen receptor negative ([place])   07/17/2017 Initial Diagnosis    Right upper outer quadrant painful palpable right breast mass by ultrasound measured 1 cm with 3 abnormal axillary lymph nodes 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  The Common Hereditary Cancers Panel was ordered.  The Hereditary Gene Panel offered by [facility] includes sequencing and/or deletion duplication testing of the following 46 genes: APC, ATM, AXIN2, BARD1, BMPR1A, BRCA1, BRCA2, BRIP1, CDH1, CDKN2A (p14ARF), CDKN2A (p16INK4a), CHEK2, CTNNA1, DICER1, EPCAM (Deletion/duplication testing only), GREM1 (promoter region deletion/duplication testing only), KIT, MEN1, MLH1, MSH2, MSH3, MSH6, MUTYH, NBN, NF1, NHTL1, PALB2, PDGFRA, PMS2, POLD1, POLE, PTEN, RAD50, RAD51C, RAD51D, SDHB, SDHC, SDHD, SMAD4, SMARCA4. STK11, TP53, TSC1, TSC2, and VHL.  The following genes were evaluated for sequence changes only: SDHA and HOXB13 c.251G>A variant only.       Results: POSITIVE for a mutation in BRCA2  c.[id number] (p.Asp1540Glyfs*6).  The date of this test report is 08/18/2017.       CURRENT THERAPY: Adriamycin cycle 3 day 1  INTERVAL HISTORY: Tawney Vanorman 51 y.o. female returns for eval prior to receiving cycle 3 of chemotherapy with Adriamcyin.  She is doing well today.  She denies any issues with receiving her chemotherapy other than fatigue.  She does have a recently noted BRCA 2 mutation from her genetic testing.     Patient Active Problem List   Diagnosis Date Noted  . Genetic testing 08/18/2017  . BRCA2 positive 08/18/2017  . Family history of breast cancer   . Family history of ovarian cancer   . Family history of prostate cancer   . Malignant neoplasm of upper-outer quadrant of right breast in female, estrogen receptor negative ([place]) 07/25/2017  . Perforated bowel ([place]) 05/13/2015  . Free intraperitoneal air 05/12/2015  . Submucosal lesion of esophagus 10/06/2014  . 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 Diabetes ([place]) 09/24/2014  . Morbid obesity ([place]) 03/20/2014    has No Known Allergies.  MEDICAL HISTORY: Past Medical History:  Diagnosis Date  . Anemia    anemia prior to uterine emboliztion procedure.  . Breast cancer ([place])   . Diabetes mellitus without complication ([place])    type 2  . Family history of breast cancer   . Family history of ovarian cancer   . Family history of prostate cancer   . Fibroids   . Headache   . Heart murmur   . Hyperlipidemia   . Hypertension     SURGICAL HISTORY: Past Surgical History:  Procedure Laterality Date  . BREATH TEK H PYLORI N/A 04/17/2014   Procedure: BREATH TEK H PYLORI;  Surgeon: Shann Medal, MD;  Location: [facility];  Service: General;  Laterality: N/A;  . CESAREAN SECTION     x3- normal development  . ESOPHAGOGASTRODUODENOSCOPY N/A 08/20/2015   Procedure: ESOPHAGOGASTRODUODENOSCOPY (EGD);  Surgeon: Alphonsa Overall, MD;  Location: [facility];  Service:  General;  Laterality: N/A;  . EXPLORATORY LAPAROTOMY  05/13/2015  . GASTRIC ROUX-EN-Y N/A 10/06/2014   Procedure: LAPAROSCOPIC ROUX-EN-Y GASTRIC BYPASS WITH UPPER ENDOSCOPY;  Surgeon: Pedro Earls, MD;  Location: [facility];  Service: General;  Laterality: N/A;  . LAPAROSCOPY N/A 05/12/2015   Procedure: LAPAROSCOPY REPAIR OF PERFORATED BOWEL;  Surgeon: Alphonsa Overall, MD;  Location: [facility];  Service: General;  Laterality: N/A;  . PORTACATH PLACEMENT N/A 08/02/2017   Procedure: INSERTION PORT-A-CATH;  Surgeon: Alphonsa Overall, MD;  Location: [facility];  Service: General;  Laterality: N/A;  . UTERINE ARTERY EMBOLIZATION  08-16-2010    SOCIAL HISTORY: Social History   Social History  . Marital status: Married    Spouse name: N/A  . Number of children: N/A  . Years of education: N/A   Occupational History  . Not on file.   Social History Main Topics  . Smoking status: Never Smoker  . Smokeless tobacco: Never Used  . Alcohol use 0.6 oz/week    1 Glasses of wine per week     Comment: social -rare occ.  . Drug use: No  . Sexual activity: Yes   Other Topics Concern  . Not on file   Social History Narrative  . No narrative on file    FAMILY HISTORY: Family History  Problem Relation Age of Onset  . Diabetes Mother   . Hypertension Mother   . Stroke Mother   . Diabetes Father   . Hypertension Father   . Prostate cancer Father 67       found at stage II  . Prostate cancer Paternal Uncle 53  . Breast cancer Paternal Aunt 43  . Cancer Maternal Aunt 55       type unknown  . Ovarian cancer Paternal Grandmother 63  . Cancer Other        type unk- dx in 40's  . Breast cancer Cousin 65    Review of Systems  Constitutional: Positive for fatigue. Negative for appetite change and chills.  HENT:   Negative for hearing loss and lump/mass.   Eyes: Negative for eye problems and icterus.  Respiratory: Negative for chest tightness, cough and shortness of breath.    Cardiovascular: Negative for chest pain, leg swelling and palpitations.  Gastrointestinal: Negative for abdominal distention, abdominal pain, constipation, diarrhea, nausea and vomiting.  Endocrine: Negative for hot flashes.  Musculoskeletal: Negative for arthralgias.  Skin: Negative for itching and rash.  Neurological: Negative for dizziness, extremity weakness, headaches and numbness.  Hematological: Negative for adenopathy. Does not bruise/bleed easily.  Psychiatric/Behavioral: Negative for depression. The patient is not nervous/anxious.       PHYSICAL EXAMINATION  ECOG PERFORMANCE STATUS: 1 - Symptomatic but completely ambulatory  Vitals:   08/31/17 1104  BP: 104/63  Pulse: 72  Resp: 18  Temp: 98.8 F (37.1 C)  SpO2: 98%    Physical Exam  Constitutional: She is oriented to person, place, and time and well-developed, well-nourished, and in no distress.  HENT:  Head: Normocephalic and atraumatic.  Mouth/Throat: Oropharynx is clear and moist. No oropharyngeal exudate.  Eyes:  Pupils are equal, round, and reactive to light. No scleral icterus.  Neck: Neck supple.  Cardiovascular: Normal rate, regular rhythm, normal heart sounds and intact distal pulses.   Pulmonary/Chest: Effort normal and breath sounds normal.  Abdominal: Soft. Bowel sounds are normal. She exhibits no distension and no mass. There is no tenderness. There is no rebound and no guarding.  Musculoskeletal: She exhibits no edema.  Lymphadenopathy:    She has no cervical adenopathy.  Neurological: She is alert and oriented to person, place, and time.  Skin: Skin is warm and dry. No rash noted.  Psychiatric: Mood and affect normal.    LABORATORY DATA:  CBC    Component Value Date/Time   WBC 11.1 (H) 08/31/2017 1016   WBC 6.2 05/15/2015 0407   RBC 3.60 (L) 08/31/2017 1016   RBC 3.64 (L) 05/15/2015 0407   HGB 11.0 (L) 08/31/2017 1016   HCT 33.8 (L) 08/31/2017 1016   PLT 193 08/31/2017 1016   MCV 93.9  08/31/2017 1016   MCH 30.6 08/31/2017 1016   MCH 31.3 05/15/2015 0407   MCHC 32.5 08/31/2017 1016   MCHC 33.2 05/15/2015 0407   RDW 13.9 08/31/2017 1016   LYMPHSABS 1.8 08/31/2017 1016   MONOABS 0.6 08/31/2017 1016   EOSABS 0.1 08/31/2017 1016   BASOSABS 0.1 08/31/2017 1016    CMP     Component Value Date/Time   NA 142 08/31/2017 1016   K 3.6 08/31/2017 1016   CL 102 05/15/2015 0407   CO2 27 08/31/2017 1016   GLUCOSE 105 08/31/2017 1016   BUN 14.9 08/31/2017 1016   CREATININE 0.9 08/31/2017 1016   CALCIUM 9.9 08/31/2017 1016   PROT 7.3 08/31/2017 1016   ALBUMIN 4.1 08/31/2017 1016   AST 16 08/31/2017 1016   ALT 17 08/31/2017 1016   ALKPHOS 68 08/31/2017 1016   BILITOT 0.29 08/31/2017 1016   GFRNONAA >60 05/15/2015 0407   GFRAA >60 05/15/2015 0407           ASSESSMENT and THERAPY PLAN:   Malignant neoplasm of upper-outer quadrant of right breast in female, estrogen receptor negative ([place]) 07/17/2017: Right upper outer quadrant painful palpable right breast mass by ultrasound measured 1 cm with 3 abnormal axillary lymph nodes 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 Neoadjuvant chemotherapy with Adriamycin and Cytoxan dose dense 4 followed by Taxol weekly 12 2. Followed by breast conserving surgery with sentinel lymph node study + targeted axillary dissection 3. Followed by adjuvant radiation therapy --------------------------------------------------------------------------- Current treatment: Cycle 3 day 1 neoadjuvant dose dense Adriamycin and Cytoxan  Chemotherapy toxicities: 1 ANC 0.1:  neutropenic precautions were reviewed with patients 2. constipation discussed her to use Colace and MiraLAX as needed 3. Fatigue related to chemotherapy  Patient has a newly discovered BRCA 2 mutation on genetic testing.  Her risks of breast cancer and  ovarian cancer were reviewed with her in detail by Dr. Lindi Adie along with risk reducing surgery (i.e. Bilateral mastectomies and TAH/BSO) has been discussed. Gusta is leaning towards bilateral mastectomies and is considering reconstruction.  She will continue to weight these options.     All questions were answered. The patient knows to call the clinic with any problems, questions or concerns. We can certainly see the patient much sooner if necessary.  A total of (30) minutes of face-to-face time was spent with this patient with greater than 50%  of that time in counseling and care-coordination.  This note was electronically signed. Scot Dock, NP 08/31/2017   Attending Note  I personally saw and examined Janae Sauce. The plan of care was discussed with her. I agree with the assessment and plan as documented above. Proceed with chemo Discussed BRCA2 mutation and its significance in detail. Signed Rulon Eisenmenger, MD

## 2017-08-31 NOTE — Telephone Encounter (Signed)
Left voicemail for patient regarding changes in her appts added per 10/31 sch msg.

## 2017-08-31 NOTE — Assessment & Plan Note (Addendum)
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 Neoadjuvant chemotherapy with Adriamycin and Cytoxan dose dense 4 followed by Taxol weekly 12 2. Followed by breast conserving surgery with sentinel lymph node study + targeted axillary dissection 3. Followed by adjuvant radiation therapy --------------------------------------------------------------------------- Current treatment: Cycle 3 day 1 neoadjuvant dose dense Adriamycin and Cytoxan  Chemotherapy toxicities: 1 ANC 0.1:  neutropenic precautions were reviewed with patients 2. constipation discussed her to use Colace and MiraLAX as needed 3. Fatigue related to chemotherapy  Patient has a newly discovered BRCA 2 mutation on genetic testing.  Her risks of breast cancer and ovarian cancer were reviewed with her in detail by Dr. Lindi Adie along with risk reducing surgery (i.e. Bilateral mastectomies and TAH/BSO) has been discussed. Kara Crawford is leaning towards bilateral mastectomies and is considering reconstruction.  She will continue to weight these options.

## 2017-08-31 NOTE — Progress Notes (Signed)
Patient Care Team: Cari Caraway, MD as PCP - General (Family Medicine) Christophe Louis, MD as Consulting Physician (Obstetrics and Gynecology) Alphonsa Overall, MD as Consulting Physician (General Surgery) Nicholas Lose, MD as Consulting Physician (Hematology and Oncology) Eppie Gibson, MD as Attending Physician (Radiation Oncology)  DIAGNOSIS:  Encounter Diagnosis  Name Primary?  . 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  The Common Hereditary Cancers Panel was ordered.  The Hereditary Gene Panel offered by [facility] includes sequencing and/or deletion duplication testing of the following 46 genes: APC, ATM, AXIN2, BARD1, BMPR1A, BRCA1, BRCA2, BRIP1, CDH1, CDKN2A (p14ARF), CDKN2A (p16INK4a), CHEK2, CTNNA1, DICER1, EPCAM (Deletion/duplication testing only), GREM1 (promoter region deletion/duplication testing only), KIT, MEN1, MLH1, MSH2, MSH3, MSH6, MUTYH, NBN, NF1, NHTL1, PALB2, PDGFRA, PMS2, POLD1, POLE, PTEN, RAD50, RAD51C, RAD51D, SDHB, SDHC, SDHD, SMAD4, SMARCA4. STK11, TP53, TSC1, TSC2, and VHL.  The following genes were evaluated for sequence changes only: SDHA and HOXB13 c.251G>A variant only.       Results:  POSITIVE for a mutation in BRCA2 c.[id number] (p.Asp1540Glyfs*6).  The date of this test report is 08/18/2017.       CHIEF COMPLIANT: Cycle 2 dose dense Adriamycin and Cytoxan  INTERVAL HISTORY: Kara Crawford is a 51 year old with above-mentioned history of right breast cancer currently neoadjuvant chemotherapy and today is cycle 2 of dose dense Adriamycin and Cytoxan.  Overall she done extremely well from a chemotherapy standpoint.  She denies any nausea vomiting.  She is feeling guilty that she is feeling so well.  On genetic testing she was found to have BRCA2 mutation.  REVIEW OF SYSTEMS:   Constitutional: Denies fevers, chills or abnormal weight loss Eyes: Denies blurriness of vision Ears, nose, mouth, throat, and face: Denies mucositis or sore throat Respiratory: Denies cough, dyspnea or wheezes Cardiovascular: Denies palpitation, chest discomfort Gastrointestinal:  Denies nausea, heartburn or change in bowel habits Skin: Denies abnormal skin rashes Lymphatics: Denies new lymphadenopathy or easy bruising Neurological:Denies numbness, tingling or new weaknesses Behavioral/Psych: Mood is stable, no new changes  Extremities: No lower extremity edema Breast:  denies any pain or lumps or nodules in either breasts All other systems were reviewed with the patient and are negative.  I have reviewed the past medical history, past surgical history, social history and family history with the patient and they are unchanged from previous note.  ALLERGIES:  has No Known Allergies.  MEDICATIONS:  Current Outpatient Prescriptions  Medication Sig Dispense Refill  . acetaminophen (TYLENOL) 325 MG tablet Take 650 mg by mouth as needed.    Marland Kitchen amLODipine (NORVASC) 10 MG tablet Take 10 mg by mouth every morning.     Marland Kitchen atorvastatin (LIPITOR) 80 MG tablet Take 80 mg  by mouth every morning.     . Calcium Carbonate-Vitamin D (CALCIUM 500/D PO) Take 2 tablets by mouth daily.    . Cholecalciferol  (VITAMIN D3 PO) Take by mouth.    . Cyanocobalamin (VITAMIN B-12 PO) Take 1 tablet by mouth every morning.    . dexamethasone (DECADRON) 4 MG tablet Take 2 tablets by mouth once a day on the day after chemotherapy and then take 2 tablets two times a day for 2 days. Take with food. 30 tablet 1  . HYDROcodone-acetaminophen (NORCO/VICODIN) 5-325 MG tablet Take 1-2 tablets by mouth every 6 (six) hours as needed for moderate pain. 15 tablet 0  . lidocaine-prilocaine (EMLA) cream Apply to affected area once 30 g 3  . lisinopril-hydrochlorothiazide (PRINZIDE,ZESTORETIC) 20-12.5 MG per tablet Take 2 tablets by mouth daily.     . LORazepam (ATIVAN) 0.5 MG tablet Take 1 tablet (0.5 mg total) by mouth every 6 (six) hours as needed (Nausea or vomiting). 30 tablet 0  . Melatonin 10 MG CAPS Take 1 tablet by mouth at bedtime.    . metoprolol succinate (TOPROL-XL) 25 MG 24 hr tablet Take 25 mg by mouth every morning.     . ondansetron (ZOFRAN) 8 MG tablet Take 1 tablet (8 mg total) by mouth 2 (two) times daily as needed. Start on the third day after chemotherapy. 30 tablet 1  . pantoprazole (PROTONIX) 40 MG tablet Take 40 mg by mouth daily.    . prochlorperazine (COMPAZINE) 10 MG tablet TAKE 1 TABLET(10 MG) BY MOUTH EVERY 6 HOURS AS NEEDED FOR NAUSEA OR VOMITING 385 tablet 1  . sitaGLIPtin-metformin (JANUMET) 50-500 MG per tablet Take 1 tablet by mouth 2 (two) times daily with a meal.      No current facility-administered medications for this visit.    Facility-Administered Medications Ordered in Other Visits  Medication Dose Route Frequency Provider Last Rate Last Dose  . sodium chloride flush (NS) 0.9 % injection 10 mL  10 mL Intravenous PRN , , MD   10 mL at 08/03/17 1516  . sodium chloride flush (NS) 0.9 % injection 10 mL  10 mL Intravenous PRN , , MD   10 mL at 08/17/17 1531    PHYSICAL EXAMINATION: ECOG PERFORMANCE STATUS: 0 - Asymptomatic  Vitals:   08/31/17 1104  BP: 104/63    Pulse: 72  Resp: 18  Temp: 98.8 F (37.1 C)  SpO2: 98%   Filed Weights   08/31/17 1104  Weight: 180 lb (81.6 kg)    GENERAL:alert, no distress and comfortable SKIN: skin color, texture, turgor are normal, no rashes or significant lesions EYES: normal, Conjunctiva are pink and non-injected, sclera clear OROPHARYNX:no exudate, no erythema and lips, buccal mucosa, and tongue normal  NECK: supple, thyroid normal size, non-tender, without nodularity LYMPH:  no palpable lymphadenopathy in the cervical, axillary or inguinal LUNGS: clear to auscultation and percussion with normal breathing effort HEART: regular rate & rhythm and no murmurs and no lower extremity edema ABDOMEN:abdomen soft, non-tender and normal bowel sounds MUSCULOSKELETAL:no cyanosis of digits and no clubbing  NEURO: alert & oriented x 3 with fluent speech, no focal motor/sensory deficits EXTREMITIES: No lower extremity edema  LABORATORY DATA:  I have reviewed the data as listed   Chemistry      Component Value Date/Time   NA 142 08/31/2017 1016   K 3.6 08/31/2017 1016   CL 102 05/15/2015 0407   CO2 27 08/31/2017 1016   BUN 14.9 08/31/2017 1016   CREATININE   0.9 08/31/2017 1016      Component Value Date/Time   CALCIUM 9.9 08/31/2017 1016   ALKPHOS 68 08/31/2017 1016   AST 16 08/31/2017 1016   ALT 17 08/31/2017 1016   BILITOT 0.29 08/31/2017 1016       Lab Results  Component Value Date   WBC 11.1 (H) 08/31/2017   HGB 11.0 (L) 08/31/2017   HCT 33.8 (L) 08/31/2017   MCV 93.9 08/31/2017   PLT 193 08/31/2017   NEUTROABS 8.6 (H) 08/31/2017    ASSESSMENT & PLAN:  Malignant neoplasm of upper-outer quadrant of right breast in female, estrogen receptor negative ([facility]) 07/17/2017: Right upper outer quadrant painful palpable right breast mass by ultrasound measured 1 cm with 3 abnormal axillary lymph nodes 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 Neoadjuvant chemotherapy with Adriamycin and Cytoxan dose dense 4 followed by Taxol weekly 12 2. Followed by bilateral mastectomies with reconstruction with sentinel lymph node study + targeted axillary dissection 3. Followed by adjuvant radiation therapy Genetic testing: BRCA2 mutation positive --------------------------------------------------------------------------- Current treatment: Cycle 2 day 1 neoadjuvant dose dense Adriamycin and Cytoxan  Chemotherapy toxicities: 1 ANC 0.1:  neutropenic precautions and instructed her to wear a mask and crowded places. 2. constipation discussed her to use Colace and MiraLAX as needed 3. Fatigue related to chemotherapy that lasted 2 days   BRCA2 mutation: I discussed with the patient that BRCA2 is a tumor suppressor gene which helps repair damaged DNA or destroy cells of the VNA cannot be repaired. In patients with BRCA1 or 2 mutations, the damaged DNA could not be repaired properly increasing the risk of cancers. BRCA2 gene is located on long arm of chromosome 13. These mutations are inherited in autosomal dominant fashion and hence 50% probability that their children may have a BRCA mutation.  Cancer risk:  Average to risk of cancer by age of 70: (Antoniou et al pooled pedigree data from 22 studies of [id number] index patients with breast or ovarian cancer) Breast cancer risk: 45 percent (95% CI, 33 to 54 percent) Ovarian cancer risk: 11 percent (95% CI, 4.1 to 18 percent)  [place] study: Tumor limited to risk by age of 70: Breast cancer risk: 55 percent (95% CI, 41 to 70 percent) Ovarian cancer risk: 16.5 percent (95% CI, 7.5 to 34 percent)  I discussed the difference between BRCA1 and BRCA2 wherein patients BRCA1 and 2 have early onset disease and much higher risk of breast cancer. The mean age of diagnosis of breast cancer between BRCA1 and 2 are 43 versus 47 years, risk of ovarian cancer is  also higher with BRCA1 but the overall risk under the age of 40 is very low.  Other cancer risks:  1. Pancreatic cancer (relative risk is 3.51) with incidence of 4.9% in BRCA2 carriers 2. Fallopian tube carcinoma and primary peritoneal carcinoma 3. Uterine papillary serous carcinoma: Overall risk is very low 4. Colorectal cancers: In many studies there was no increased risk But there may be some for BRCA1 carriers 5. Melanoma and other skin cancers: The risk of oatmeal melanoma is increase in BRCA2 mutation carriers but it still extremely rare. 6. Endometrial cancer: Not very clear in terms of risks it is thought to be very low  Ovarian cancer risk reduction: Patient plans bilateral salpingo-nephrectomy  Patient did not report any nausea Return to clinic in 2 weeks for cycle 3     I spent 25 minutes talking to the patient of which more than half was spent in counseling and coordination of care.  No orders of the defined types were placed in this encounter.  The patient has a good understanding of the overall plan. she agrees with it. she will call with any problems that may develop before the next visit here.   ,  K, MD 08/31/17    

## 2017-08-31 NOTE — Patient Instructions (Signed)

## 2017-08-31 NOTE — Patient Instructions (Signed)
[facility] Discharge Instructions for Patients Receiving Chemotherapy  Today you received the following chemotherapy agents Adriamycin and Cytoxan  To help prevent nausea and vomiting after your treatment, we encourage you to take your nausea medication as directed.  If you develop nausea and vomiting that is not controlled by your nausea medication, call the clinic.   BELOW ARE SYMPTOMS THAT SHOULD BE REPORTED IMMEDIATELY:  *FEVER GREATER THAN 100.5 F  *CHILLS WITH OR WITHOUT FEVER  NAUSEA AND VOMITING THAT IS NOT CONTROLLED WITH YOUR NAUSEA MEDICATION  *UNUSUAL SHORTNESS OF BREATH  *UNUSUAL BRUISING OR BLEEDING  TENDERNESS IN MOUTH AND THROAT WITH OR WITHOUT PRESENCE OF ULCERS  *URINARY PROBLEMS  *BOWEL PROBLEMS  UNUSUAL RASH Items with * indicate a potential emergency and should be followed up as soon as possible.  Feel free to call the clinic should you have any questions or concerns. The clinic phone number is [phone].  Please show the CHEMO ALERT CARD at check-in to the Emergency Department and triage nurse.   

## 2017-09-01 ENCOUNTER — Telehealth: Payer: Self-pay | Admitting: Hematology and Oncology

## 2017-09-01 NOTE — Telephone Encounter (Signed)
09/02/2017 Faxed completed FMLA forms to [employer] @ [phone] @ 9:20 am. Hulen Skains patient's husband, Dominica Severin, @ [phone] @ 9:55 am and left message informing him that forms were faxed and that if he'd like a copy to please give me a call and I would mail them.

## 2017-09-14 ENCOUNTER — Ambulatory Visit (HOSPITAL_BASED_OUTPATIENT_CLINIC_OR_DEPARTMENT_OTHER): Payer: 59 | Admitting: Hematology and Oncology

## 2017-09-14 ENCOUNTER — Other Ambulatory Visit (HOSPITAL_BASED_OUTPATIENT_CLINIC_OR_DEPARTMENT_OTHER): Payer: 59

## 2017-09-14 ENCOUNTER — Ambulatory Visit: Payer: 59

## 2017-09-14 ENCOUNTER — Ambulatory Visit (HOSPITAL_BASED_OUTPATIENT_CLINIC_OR_DEPARTMENT_OTHER): Payer: 59

## 2017-09-14 ENCOUNTER — Encounter: Payer: Self-pay | Admitting: *Deleted

## 2017-09-14 DIAGNOSIS — C50411 Malignant neoplasm of upper-outer quadrant of right female breast: Secondary | ICD-10-CM | POA: Diagnosis not present

## 2017-09-14 DIAGNOSIS — Z1501 Genetic susceptibility to malignant neoplasm of breast: Secondary | ICD-10-CM

## 2017-09-14 DIAGNOSIS — Z171 Estrogen receptor negative status [ER-]: Principal | ICD-10-CM

## 2017-09-14 DIAGNOSIS — Z5111 Encounter for antineoplastic chemotherapy: Secondary | ICD-10-CM | POA: Diagnosis not present

## 2017-09-14 DIAGNOSIS — L658 Other specified nonscarring hair loss: Secondary | ICD-10-CM | POA: Diagnosis not present

## 2017-09-14 LAB — COMPREHENSIVE METABOLIC PANEL
ALBUMIN: 4 g/dL (ref 3.5–5.0)
ALT: 15 U/L (ref 0–55)
AST: 14 U/L (ref 5–34)
Alkaline Phosphatase: 66 U/L (ref 40–150)
Anion Gap: 8 mEq/L (ref 3–11)
BUN: 12.9 mg/dL (ref 7.0–26.0)
CALCIUM: 10 mg/dL (ref 8.4–10.4)
CO2: 27 mEq/L (ref 22–29)
Chloride: 104 mEq/L (ref 98–109)
Creatinine: 1 mg/dL (ref 0.6–1.1)
EGFR: >60 mL/min/{1.73_m2} (ref >60)
GLUCOSE: 75 mg/dL (ref 70–140)
POTASSIUM: 3.8 meq/L (ref 3.5–5.1)
SODIUM: 140 meq/L (ref 136–145)
TOTAL PROTEIN: 7.2 g/dL (ref 6.4–8.3)
Total Bilirubin: 0.33 mg/dL (ref 0.20–1.20)

## 2017-09-14 LAB — CBC WITH DIFFERENTIAL/PLATELET
BASO%: 0.5 % (ref 0.0–2.0)
Basophils Absolute: 0.1 10*3/uL (ref 0.0–0.1)
EOS%: 0.9 % (ref 0.0–7.0)
Eosinophils Absolute: 0.1 10*3/uL (ref 0.0–0.5)
HEMATOCRIT: 32.1 % — AB (ref 34.8–46.6)
HEMOGLOBIN: 10.6 g/dL — AB (ref 11.6–15.9)
LYMPH#: 1.3 10*3/uL (ref 0.9–3.3)
LYMPH%: 11.9 % — ABNORMAL LOW (ref 14.0–49.7)
MCH: 31.4 pg (ref 25.1–34.0)
MCHC: 33 g/dL (ref 31.5–36.0)
MCV: 95 fL (ref 79.5–101.0)
MONO#: 0.7 10*3/uL (ref 0.1–0.9)
MONO%: 6.1 % (ref 0.0–14.0)
NEUT%: 80.6 % — ABNORMAL HIGH (ref 38.4–76.8)
NEUTROS ABS: 8.9 10*3/uL — AB (ref 1.5–6.5)
Platelets: 221 10*3/uL (ref 145–400)
RBC: 3.38 10*6/uL — ABNORMAL LOW (ref 3.70–5.45)
RDW: 15.3 % — AB (ref 11.2–14.5)
WBC: 11.1 10*3/uL — AB (ref 3.9–10.3)

## 2017-09-14 MED ORDER — SODIUM CHLORIDE 0.9% FLUSH
10.0000 mL | INTRAVENOUS | Status: DC | PRN
  Administered 2017-09-14: 10 mL
  Filled 2017-09-14: qty 10

## 2017-09-14 MED ORDER — SODIUM CHLORIDE 0.9 % IV SOLN
Freq: Once | INTRAVENOUS | Status: AC
  Administered 2017-09-14: 12:00:00 via INTRAVENOUS
  Filled 2017-09-14: qty 5

## 2017-09-14 MED ORDER — PALONOSETRON HCL INJECTION 0.25 MG/5ML
0.2500 mg | Freq: Once | INTRAVENOUS | Status: AC
  Administered 2017-09-14: 0.25 mg via INTRAVENOUS

## 2017-09-14 MED ORDER — SODIUM CHLORIDE 0.9 % IV SOLN
500.0000 mg/m2 | Freq: Once | INTRAVENOUS | Status: AC
  Administered 2017-09-14: 940 mg via INTRAVENOUS
  Filled 2017-09-14: qty 47

## 2017-09-14 MED ORDER — DOXORUBICIN HCL CHEMO IV INJECTION 2 MG/ML
50.0000 mg/m2 | Freq: Once | INTRAVENOUS | Status: AC
  Administered 2017-09-14: 94 mg via INTRAVENOUS
  Filled 2017-09-14: qty 47

## 2017-09-14 MED ORDER — SODIUM CHLORIDE 0.9 % IV SOLN
Freq: Once | INTRAVENOUS | Status: AC
  Administered 2017-09-14: 11:00:00 via INTRAVENOUS

## 2017-09-14 MED ORDER — PEGFILGRASTIM 6 MG/0.6ML ~~LOC~~ PSKT
6.0000 mg | PREFILLED_SYRINGE | Freq: Once | SUBCUTANEOUS | Status: AC
  Administered 2017-09-14: 6 mg via SUBCUTANEOUS
  Filled 2017-09-14: qty 0.6

## 2017-09-14 MED ORDER — HEPARIN SOD (PORK) LOCK FLUSH 100 UNIT/ML IV SOLN
500.0000 [IU] | Freq: Once | INTRAVENOUS | Status: AC | PRN
  Administered 2017-09-14: 500 [IU]
  Filled 2017-09-14: qty 5

## 2017-09-14 MED ORDER — PALONOSETRON HCL INJECTION 0.25 MG/5ML
INTRAVENOUS | Status: AC
  Filled 2017-09-14: qty 5

## 2017-09-14 NOTE — Assessment & Plan Note (Addendum)
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 Neoadjuvant chemotherapy with Adriamycin and Cytoxan dose dense 4 followed by Taxol weekly 12 2. Followed by bilateral mastectomies with reconstruction with sentinel lymph node study + targeted axillary dissection 3. Followed by adjuvant radiation therapy Genetic testing: BRCA2 mutation positive --------------------------------------------------------------------------- Current treatment: Cycle 3 day 1neoadjuvant dose dense Adriamycin and Cytoxan  Chemotherapy toxicities: 1ANC 0.1: neutropenic precautions and instructed her to wear a mask and crowded places. 2. constipation discussed her to use Colace and MiraLAX as needed 3.Fatigue related to chemotherapy that lasted 2 days  4.  Alopecia  I discussed with her that because she has a BRCA2 mutation, I would like to add carboplatin with Taxol.  Return to clinic in 2 weeks for cycle 4

## 2017-09-14 NOTE — Progress Notes (Signed)
Patient Care Team: Cari Caraway, MD as PCP - General (Family Medicine) Christophe Louis, MD as Consulting Physician (Obstetrics and Gynecology) Alphonsa Overall, MD as Consulting Physician (General Surgery) Nicholas Lose, MD as Consulting Physician (Hematology and Oncology) Eppie Gibson, MD as Attending Physician (Radiation Oncology)  DIAGNOSIS:  Encounter Diagnosis  Name Primary?  . 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  The Common Hereditary Cancers Panel was ordered.  The Hereditary Gene Panel offered by [facility] includes sequencing and/or deletion duplication testing of the following 46 genes: APC, ATM, AXIN2, BARD1, BMPR1A, BRCA1, BRCA2, BRIP1, CDH1, CDKN2A (p14ARF), CDKN2A (p16INK4a), CHEK2, CTNNA1, DICER1, EPCAM (Deletion/duplication testing only), GREM1 (promoter region deletion/duplication testing only), KIT, MEN1, MLH1, MSH2, MSH3, MSH6, MUTYH, NBN, NF1, NHTL1, PALB2, PDGFRA, PMS2, POLD1, POLE, PTEN, RAD50, RAD51C, RAD51D, SDHB, SDHC, SDHD, SMAD4, SMARCA4. STK11, TP53, TSC1, TSC2, and VHL.  The following genes were evaluated for sequence changes only: SDHA and HOXB13 c.251G>A variant only.       Results:  POSITIVE for a mutation in BRCA2 c.[id number] (p.Asp1540Glyfs*6).  The date of this test report is 08/18/2017.       CHIEF COMPLIANT: Cycle 4 dose dense Adriamycin and Cytoxan  INTERVAL HISTORY: Kara Crawford is a 51 year old with above-mentioned history of triple negative right breast cancer currently on neoadjuvant chemotherapy with dose dense Adriamycin and Cytoxan.  She appears to be tolerating the treatment fairly well.  She did have fatigue and some nausea.  Denies any mouth sores.  REVIEW OF SYSTEMS:   Constitutional: Denies fevers, chills or abnormal weight loss Eyes: Denies blurriness of vision Ears, nose, mouth, throat, and face: Denies mucositis or sore throat Respiratory: Denies cough, dyspnea or wheezes Cardiovascular: Denies palpitation, chest discomfort Gastrointestinal:  Denies nausea, heartburn or change in bowel habits Skin: Denies abnormal skin rashes Lymphatics: Denies new lymphadenopathy or easy bruising Neurological:Denies numbness, tingling or new weaknesses Behavioral/Psych: Mood is stable, no new changes  Extremities: No lower extremity edema All other systems were reviewed with the patient and are negative.  I have reviewed the past medical history, past surgical history, social history and family history with the patient and they are unchanged from previous note.  ALLERGIES:  has No Known Allergies.  MEDICATIONS:  Current Outpatient Medications  Medication Sig Dispense Refill  . acetaminophen (TYLENOL) 325 MG tablet Take 650 mg by mouth as needed.    Marland Kitchen amLODipine (NORVASC) 10 MG tablet Take 10 mg by mouth every morning.     Marland Kitchen atorvastatin (LIPITOR) 80 MG tablet Take 80 mg by mouth every morning.     . Calcium Carbonate-Vitamin D (CALCIUM 500/D PO) Take 2 tablets by mouth daily.    . Cholecalciferol (VITAMIN D3 PO)  Take by mouth.    . Cyanocobalamin (VITAMIN B-12 PO) Take 1 tablet by mouth every morning.    Marland Kitchen dexamethasone (DECADRON) 4 MG tablet  Take 2 tablets by mouth once a day on the day after chemotherapy and then take 2 tablets two times a day for 2 days. Take with food. 30 tablet 1  . HYDROcodone-acetaminophen (NORCO/VICODIN) 5-325 MG tablet Take 1-2 tablets by mouth every 6 (six) hours as needed for moderate pain. 15 tablet 0  . lidocaine-prilocaine (EMLA) cream Apply to affected area once 30 g 3  . lisinopril-hydrochlorothiazide (PRINZIDE,ZESTORETIC) 20-12.5 MG per tablet Take 2 tablets by mouth daily.     Marland Kitchen LORazepam (ATIVAN) 0.5 MG tablet Take 1 tablet (0.5 mg total) by mouth every 6 (six) hours as needed (Nausea or vomiting). 30 tablet 0  . Melatonin 10 MG CAPS Take 1 tablet by mouth at bedtime.    . metoprolol succinate (TOPROL-XL) 25 MG 24 hr tablet Take 25 mg by mouth every morning.     . ondansetron (ZOFRAN) 8 MG tablet Take 1 tablet (8 mg total) by mouth 2 (two) times daily as needed. Start on the third day after chemotherapy. 30 tablet 1  . pantoprazole (PROTONIX) 40 MG tablet Take 40 mg by mouth daily.    . prochlorperazine (COMPAZINE) 10 MG tablet TAKE 1 TABLET(10 MG) BY MOUTH EVERY 6 HOURS AS NEEDED FOR NAUSEA OR VOMITING 385 tablet 1  . sitaGLIPtin-metformin (JANUMET) 50-500 MG per tablet Take 1 tablet by mouth 2 (two) times daily with a meal.      No current facility-administered medications for this visit.    Facility-Administered Medications Ordered in Other Visits  Medication Dose Route Frequency Provider Last Rate Last Dose  . sodium chloride flush (NS) 0.9 % injection 10 mL  10 mL Intravenous PRN Nicholas Lose, MD   10 mL at 08/03/17 1516  . sodium chloride flush (NS) 0.9 % injection 10 mL  10 mL Intravenous PRN Nicholas Lose, MD   10 mL at 08/17/17 1531    PHYSICAL EXAMINATION: ECOG PERFORMANCE STATUS: 1 - Symptomatic but completely ambulatory  Vitals:   09/14/17 1039  BP: 110/62  Pulse: 79  Resp: 20  Temp: 98.4 F (36.9 C)  SpO2: 98%   Filed Weights   09/14/17 1039  Weight: 184 lb 3.2 oz (83.6  kg)    GENERAL:alert, no distress and comfortable SKIN: skin color, texture, turgor are normal, no rashes or significant lesions EYES: normal, Conjunctiva are pink and non-injected, sclera clear OROPHARYNX:no exudate, no erythema and lips, buccal mucosa, and tongue normal  NECK: supple, thyroid normal size, non-tender, without nodularity LYMPH:  no palpable lymphadenopathy in the cervical, axillary or inguinal LUNGS: clear to auscultation and percussion with normal breathing effort HEART: regular rate & rhythm and no murmurs and no lower extremity edema ABDOMEN:abdomen soft, non-tender and normal bowel sounds MUSCULOSKELETAL:no cyanosis of digits and no clubbing  NEURO: alert & oriented x 3 with fluent speech, no focal motor/sensory deficits EXTREMITIES: No lower extremity edema  LABORATORY DATA:  I have reviewed the data as listed   Chemistry      Component Value Date/Time   NA 142 08/31/2017 1016   K 3.6 08/31/2017 1016   CL 102 05/15/2015 0407   CO2 27 08/31/2017 1016   BUN 14.9 08/31/2017 1016   CREATININE 0.9 08/31/2017 1016      Component Value Date/Time   CALCIUM 9.9 08/31/2017 1016   ALKPHOS 68 08/31/2017 1016  AST 16 08/31/2017 1016   ALT 17 08/31/2017 1016   BILITOT 0.29 08/31/2017 1016       Lab Results  Component Value Date   WBC 11.1 (H) 09/14/2017   HGB 10.6 (L) 09/14/2017   HCT 32.1 (L) 09/14/2017   MCV 95.0 09/14/2017   PLT 221 09/14/2017   NEUTROABS 8.9 (H) 09/14/2017    ASSESSMENT & PLAN:  Malignant neoplasm of upper-outer quadrant of right breast in female, estrogen receptor negative ([place]) 07/17/2017: Right upper outer quadrant painful palpable right breast mass by ultrasound measured 1 cm with 3 abnormal axillary lymph nodes 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 Neoadjuvant chemotherapy with Adriamycin and Cytoxan dose  dense 4 followed by Taxol weekly 12 2. Followed by bilateral mastectomies with reconstruction with sentinel lymph node study + targeted axillary dissection 3. Followed by adjuvant radiation therapy Genetic testing: BRCA2 mutation positive --------------------------------------------------------------------------- Current treatment: Cycle 3 day 1neoadjuvant dose dense Adriamycin and Cytoxan  Chemotherapy toxicities: 1ANC 0.1: neutropenic precautions and instructed her to wear a mask and crowded places. 2. constipation discussed her to use Colace and MiraLAX as needed 3.Fatigue related to chemotherapy that lasted 2 days  4.  Alopecia  I discussed with her that because she has a BRCA2 mutation, I would like to add carboplatin with Taxol.  Return to clinic in 2 weeks for cycle 4    I spent 25 minutes talking to the patient of which more than half was spent in counseling and coordination of care.  No orders of the defined types were placed in this encounter.  The patient has a good understanding of the overall plan. she agrees with it. she will call with any problems that may develop before the next visit here.   Rulon Eisenmenger, MD 09/14/17

## 2017-09-14 NOTE — Patient Instructions (Signed)
[facility] Discharge Instructions for Patients Receiving Chemotherapy  Today you received the following chemotherapy agents Adriamycin and Cytoxan  To help prevent nausea and vomiting after your treatment, we encourage you to take your nausea medication as directed.  If you develop nausea and vomiting that is not controlled by your nausea medication, call the clinic.   BELOW ARE SYMPTOMS THAT SHOULD BE REPORTED IMMEDIATELY:  *FEVER GREATER THAN 100.5 F  *CHILLS WITH OR WITHOUT FEVER  NAUSEA AND VOMITING THAT IS NOT CONTROLLED WITH YOUR NAUSEA MEDICATION  *UNUSUAL SHORTNESS OF BREATH  *UNUSUAL BRUISING OR BLEEDING  TENDERNESS IN MOUTH AND THROAT WITH OR WITHOUT PRESENCE OF ULCERS  *URINARY PROBLEMS  *BOWEL PROBLEMS  UNUSUAL RASH Items with * indicate a potential emergency and should be followed up as soon as possible.  Feel free to call the clinic should you have any questions or concerns. The clinic phone number is [phone].  Please show the CHEMO ALERT CARD at check-in to the Emergency Department and triage nurse.   

## 2017-09-22 ENCOUNTER — Telehealth: Payer: Self-pay | Admitting: Hematology and Oncology

## 2017-09-22 NOTE — Telephone Encounter (Signed)
Per 11/15 - no los at check out

## 2017-09-28 ENCOUNTER — Ambulatory Visit (HOSPITAL_BASED_OUTPATIENT_CLINIC_OR_DEPARTMENT_OTHER): Payer: 59

## 2017-09-28 ENCOUNTER — Ambulatory Visit (HOSPITAL_BASED_OUTPATIENT_CLINIC_OR_DEPARTMENT_OTHER): Payer: 59 | Admitting: Hematology and Oncology

## 2017-09-28 ENCOUNTER — Other Ambulatory Visit (HOSPITAL_BASED_OUTPATIENT_CLINIC_OR_DEPARTMENT_OTHER): Payer: 59

## 2017-09-28 ENCOUNTER — Ambulatory Visit: Payer: 59

## 2017-09-28 VITALS — BP 130/73 | HR 89 | Temp 98.7°F | Resp 18

## 2017-09-28 DIAGNOSIS — L658 Other specified nonscarring hair loss: Secondary | ICD-10-CM

## 2017-09-28 DIAGNOSIS — C50411 Malignant neoplasm of upper-outer quadrant of right female breast: Secondary | ICD-10-CM

## 2017-09-28 DIAGNOSIS — R530 Neoplastic (malignant) related fatigue: Secondary | ICD-10-CM | POA: Diagnosis not present

## 2017-09-28 DIAGNOSIS — Z5111 Encounter for antineoplastic chemotherapy: Secondary | ICD-10-CM

## 2017-09-28 DIAGNOSIS — Z171 Estrogen receptor negative status [ER-]: Secondary | ICD-10-CM | POA: Diagnosis not present

## 2017-09-28 DIAGNOSIS — Z1501 Genetic susceptibility to malignant neoplasm of breast: Secondary | ICD-10-CM

## 2017-09-28 LAB — CBC WITH DIFFERENTIAL/PLATELET
BASO%: 0.5 % (ref 0.0–2.0)
Basophils Absolute: 0.1 10*3/uL (ref 0.0–0.1)
EOS%: 0.5 % (ref 0.0–7.0)
Eosinophils Absolute: 0.1 10*3/uL (ref 0.0–0.5)
HCT: 32.6 % — ABNORMAL LOW (ref 34.8–46.6)
HGB: 10.7 g/dL — ABNORMAL LOW (ref 11.6–15.9)
LYMPH%: 12.5 % — AB (ref 14.0–49.7)
MCH: 31.5 pg (ref 25.1–34.0)
MCHC: 32.8 g/dL (ref 31.5–36.0)
MCV: 95.9 fL (ref 79.5–101.0)
MONO#: 0.6 10*3/uL (ref 0.1–0.9)
MONO%: 5.7 % (ref 0.0–14.0)
NEUT#: 7.8 10*3/uL — ABNORMAL HIGH (ref 1.5–6.5)
NEUT%: 80.8 % — AB (ref 38.4–76.8)
PLATELETS: 174 10*3/uL (ref 145–400)
RBC: 3.4 10*6/uL — AB (ref 3.70–5.45)
RDW: 16 % — ABNORMAL HIGH (ref 11.2–14.5)
WBC: 9.7 10*3/uL (ref 3.9–10.3)
lymph#: 1.2 10*3/uL (ref 0.9–3.3)

## 2017-09-28 LAB — COMPREHENSIVE METABOLIC PANEL
ALBUMIN: 4.4 g/dL (ref 3.5–5.0)
ALK PHOS: 70 U/L (ref 40–150)
ALT: 14 U/L (ref 0–55)
AST: 16 U/L (ref 5–34)
Anion Gap: 10 mEq/L (ref 3–11)
BILIRUBIN TOTAL: 0.41 mg/dL (ref 0.20–1.20)
BUN: 12.8 mg/dL (ref 7.0–26.0)
CO2: 28 mEq/L (ref 22–29)
Calcium: 9.9 mg/dL (ref 8.4–10.4)
Chloride: 103 mEq/L (ref 98–109)
Creatinine: 0.9 mg/dL (ref 0.6–1.1)
EGFR: >60 mL/min/{1.73_m2} (ref >60)
Glucose: 88 mg/dl (ref 70–140)
Potassium: 3.6 mEq/L (ref 3.5–5.1)
SODIUM: 141 meq/L (ref 136–145)
TOTAL PROTEIN: 7.4 g/dL (ref 6.4–8.3)

## 2017-09-28 MED ORDER — SODIUM CHLORIDE 0.9 % IV SOLN
Freq: Once | INTRAVENOUS | Status: AC
  Administered 2017-09-28: 12:00:00 via INTRAVENOUS

## 2017-09-28 MED ORDER — HEPARIN SOD (PORK) LOCK FLUSH 100 UNIT/ML IV SOLN
500.0000 [IU] | Freq: Once | INTRAVENOUS | Status: AC | PRN
  Administered 2017-09-28: 500 [IU]
  Filled 2017-09-28: qty 5

## 2017-09-28 MED ORDER — SODIUM CHLORIDE 0.9 % IV SOLN
239.2000 mg | Freq: Once | INTRAVENOUS | Status: AC
  Administered 2017-09-28: 240 mg via INTRAVENOUS
  Filled 2017-09-28: qty 24

## 2017-09-28 MED ORDER — DIPHENHYDRAMINE HCL 50 MG/ML IJ SOLN
50.0000 mg | Freq: Once | INTRAMUSCULAR | Status: AC
  Administered 2017-09-28: 50 mg via INTRAVENOUS

## 2017-09-28 MED ORDER — SODIUM CHLORIDE 0.9 % IV SOLN
50.0000 mg/m2 | Freq: Once | INTRAVENOUS | Status: AC
  Administered 2017-09-28: 96 mg via INTRAVENOUS
  Filled 2017-09-28: qty 16

## 2017-09-28 MED ORDER — SODIUM CHLORIDE 0.9% FLUSH
10.0000 mL | INTRAVENOUS | Status: DC | PRN
  Administered 2017-09-28: 10 mL
  Filled 2017-09-28: qty 10

## 2017-09-28 MED ORDER — DEXAMETHASONE SODIUM PHOSPHATE 100 MG/10ML IJ SOLN
20.0000 mg | Freq: Once | INTRAMUSCULAR | Status: AC
  Administered 2017-09-28: 20 mg via INTRAVENOUS
  Filled 2017-09-28: qty 2

## 2017-09-28 MED ORDER — DIPHENHYDRAMINE HCL 50 MG/ML IJ SOLN
INTRAMUSCULAR | Status: AC
Start: 2017-09-28 — End: ?
  Filled 2017-09-28: qty 1

## 2017-09-28 MED ORDER — FAMOTIDINE IN NACL 20-0.9 MG/50ML-% IV SOLN
20.0000 mg | Freq: Once | INTRAVENOUS | Status: AC
Start: 2017-09-28 — End: 2017-09-28
  Administered 2017-09-28: 20 mg via INTRAVENOUS

## 2017-09-28 MED ORDER — FAMOTIDINE IN NACL 20-0.9 MG/50ML-% IV SOLN
INTRAVENOUS | Status: AC
  Filled 2017-09-28: qty 50

## 2017-09-28 NOTE — Progress Notes (Signed)
Patient Care Team: Cari Caraway, MD as PCP - General (Family Medicine) Christophe Louis, MD as Consulting Physician (Obstetrics and Gynecology) Alphonsa Overall, MD as Consulting Physician (General Surgery) Nicholas Lose, MD as Consulting Physician (Hematology and Oncology) Eppie Gibson, MD as Attending Physician (Radiation Oncology)  DIAGNOSIS:  Encounter Diagnosis  Name Primary?  . 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  The Common Hereditary Cancers Panel was ordered.  The Hereditary Gene Panel offered by [facility] includes sequencing and/or deletion duplication testing of the following 46 genes: APC, ATM, AXIN2, BARD1, BMPR1A, BRCA1, BRCA2, BRIP1, CDH1, CDKN2A (p14ARF), CDKN2A (p16INK4a), CHEK2, CTNNA1, DICER1, EPCAM (Deletion/duplication testing only), GREM1 (promoter region deletion/duplication testing only), KIT, MEN1, MLH1, MSH2, MSH3, MSH6, MUTYH, NBN, NF1, NHTL1, PALB2, PDGFRA, PMS2, POLD1, POLE, PTEN, RAD50, RAD51C, RAD51D, SDHB, SDHC, SDHD, SMAD4, SMARCA4. STK11, TP53, TSC1, TSC2, and VHL.  The following genes were evaluated for sequence changes only: SDHA and HOXB13 c.251G>A variant only.       Results:  POSITIVE for a mutation in BRCA2 [id number] (p.Asp1540Glyfs*6).  The date of this test report is 08/18/2017.       CHIEF COMPLIANT: Cycle 1 carboplatin and Taxol  INTERVAL HISTORY: Presleigh Feldstein is a 51 year old with above-mentioned history of right breast cancer who is here to receive first cycle of carboplatin and Taxol.  This is primarily because she has a BRCA mutation.  Her complaints are related to fatigue, dripping nose and tearing of the eyes.  REVIEW OF SYSTEMS:   Constitutional: Denies fevers, chills or abnormal weight loss Eyes: Denies blurriness of vision Ears, nose, mouth, throat, and face: Denies mucositis or sore throat Respiratory: Denies cough, dyspnea or wheezes Cardiovascular: Denies palpitation, chest discomfort Gastrointestinal:  Denies nausea, heartburn or change in bowel habits Skin: Denies abnormal skin rashes Lymphatics: Denies new lymphadenopathy or easy bruising Neurological:Denies numbness, tingling or new weaknesses Behavioral/Psych: Mood is stable, no new changes  Extremities: No lower extremity edema All other systems were reviewed with the patient and are negative.  I have reviewed the past medical history, past surgical history, social history and family history with the patient and they are unchanged from previous note.  ALLERGIES:  has No Known Allergies.  MEDICATIONS:  Current Outpatient Medications  Medication Sig Dispense Refill  . acetaminophen (TYLENOL) 325 MG tablet Take 650 mg by mouth as needed.    Marland Kitchen amLODipine (NORVASC) 10 MG tablet Take 10 mg by mouth every morning.     Marland Kitchen atorvastatin (LIPITOR) 80 MG tablet Take 80 mg by mouth every morning.     . Calcium Carbonate-Vitamin D (CALCIUM 500/D PO) Take 2 tablets by mouth daily.    . Cholecalciferol (VITAMIN D3 PO) Take by  mouth.    . Cyanocobalamin (VITAMIN B-12 PO) Take 1 tablet by mouth every morning.    Marland Kitchen HYDROcodone-acetaminophen (NORCO/VICODIN) 5-325 MG tablet Take 1-2 tablets  by mouth every 6 (six) hours as needed for moderate pain. 15 tablet 0  . lidocaine-prilocaine (EMLA) cream Apply to affected area once 30 g 3  . lisinopril-hydrochlorothiazide (PRINZIDE,ZESTORETIC) 20-12.5 MG per tablet Take 2 tablets by mouth daily.     Marland Kitchen LORazepam (ATIVAN) 0.5 MG tablet Take 1 tablet (0.5 mg total) by mouth every 6 (six) hours as needed (Nausea or vomiting). 30 tablet 0  . Melatonin 10 MG CAPS Take 1 tablet by mouth at bedtime.    . metoprolol succinate (TOPROL-XL) 25 MG 24 hr tablet Take 25 mg by mouth every morning.     . ondansetron (ZOFRAN) 8 MG tablet Take 1 tablet (8 mg total) by mouth 2 (two) times daily as needed. Start on the third day after chemotherapy. 30 tablet 1  . pantoprazole (PROTONIX) 40 MG tablet Take 40 mg by mouth daily.    . prochlorperazine (COMPAZINE) 10 MG tablet TAKE 1 TABLET(10 MG) BY MOUTH EVERY 6 HOURS AS NEEDED FOR NAUSEA OR VOMITING 385 tablet 1  . sitaGLIPtin-metformin (JANUMET) 50-500 MG per tablet Take 1 tablet by mouth 2 (two) times daily with a meal.      No current facility-administered medications for this visit.    Facility-Administered Medications Ordered in Other Visits  Medication Dose Route Frequency Provider Last Rate Last Dose  . sodium chloride flush (NS) 0.9 % injection 10 mL  10 mL Intravenous PRN Nicholas Lose, MD   10 mL at 08/03/17 1516  . sodium chloride flush (NS) 0.9 % injection 10 mL  10 mL Intravenous PRN Nicholas Lose, MD   10 mL at 08/17/17 1531    PHYSICAL EXAMINATION: ECOG PERFORMANCE STATUS: 1 - Symptomatic but completely ambulatory  Vitals:   09/28/17 1030  BP: (!) 144/91  Pulse: 80  Resp: 20  Temp: 99.1 F (37.3 C)  SpO2: 99%   Filed Weights   09/28/17 1030  Weight: 179 lb 4.8 oz (81.3 kg)    GENERAL:alert, no distress and comfortable SKIN: skin color, texture, turgor are normal, no rashes or significant lesions EYES: normal, Conjunctiva are pink and non-injected, sclera clear OROPHARYNX:no  exudate, no erythema and lips, buccal mucosa, and tongue normal  NECK: supple, thyroid normal size, non-tender, without nodularity LYMPH:  no palpable lymphadenopathy in the cervical, axillary or inguinal LUNGS: clear to auscultation and percussion with normal breathing effort HEART: regular rate & rhythm and no murmurs and no lower extremity edema ABDOMEN:abdomen soft, non-tender and normal bowel sounds MUSCULOSKELETAL:no cyanosis of digits and no clubbing  NEURO: alert & oriented x 3 with fluent speech, no focal motor/sensory deficits EXTREMITIES: No lower extremity edema  LABORATORY DATA:  I have reviewed the data as listed   Chemistry      Component Value Date/Time   NA 140 09/14/2017 1007   K 3.8 09/14/2017 1007   CL 102 05/15/2015 0407   CO2 27 09/14/2017 1007   BUN 12.9 09/14/2017 1007   CREATININE 1.0 09/14/2017 1007      Component Value Date/Time   CALCIUM 10.0 09/14/2017 1007   ALKPHOS 66 09/14/2017 1007   AST 14 09/14/2017 1007   ALT 15 09/14/2017 1007   BILITOT 0.33 09/14/2017 1007       Lab Results  Component Value Date   WBC 9.7 09/28/2017   HGB 10.7 (L) 09/28/2017  HCT 32.6 (L) 09/28/2017   MCV 95.9 09/28/2017   PLT 174 09/28/2017   NEUTROABS 7.8 (H) 09/28/2017    ASSESSMENT & PLAN:  Malignant neoplasm of upper-outer quadrant of right breast in female, estrogen receptor negative ([place]) 07/17/2017: Right upper outer quadrant painful palpable right breast mass by ultrasound measured 1 cm with 3 abnormal axillary lymph nodes 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 Neoadjuvant chemotherapy with Adriamycin and Cytoxan dose dense 4 followed by Taxol weekly 12 2. Followed bybilateral mastectomies with reconstructionwith sentinel lymph node study + targeted axillary dissection 3. Followed by adjuvant radiation therapy Genetic testing:  BRCA2 mutation positive --------------------------------------------------------------------------- Current treatment: Cycle1 Taxol and carboplatin weekly  Chemotherapy toxicities: 1constipation discussed her to use Colace and MOM as needed 2.Fatigue related to chemotherapy  3.  Alopecia  I discussed with her that because she has a BRCA2 mutation, I would like to add carboplatin with Taxol.  Return to clinic in 1 week for cycle 2 taxol- Carbo and toxicity check     I spent 25 minutes talking to the patient of which more than half was spent in counseling and coordination of care.  No orders of the defined types were placed in this encounter.  The patient has a good understanding of the overall plan. she agrees with it. she will call with any problems that may develop before the next visit here.   Rulon Eisenmenger, MD 09/28/17

## 2017-09-28 NOTE — Assessment & Plan Note (Signed)
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 Neoadjuvant chemotherapy with Adriamycin and Cytoxan dose dense 4 followed by Taxol weekly 12 2. Followed bybilateral mastectomies with reconstructionwith sentinel lymph node study + targeted axillary dissection 3. Followed by adjuvant radiation therapy Genetic testing: BRCA2 mutation positive --------------------------------------------------------------------------- Current treatment: Cycle3day 1neoadjuvant dose dense Adriamycin and Cytoxan  Chemotherapy toxicities: 1ANC 0.1: neutropenic precautions and instructed her to wear a mask and crowded places. 2. constipation discussed her to use Colace and MiraLAX as needed 3.Fatigue related to chemotherapy that lasted 2 days  4.  Alopecia  I discussed with her that because she has a BRCA2 mutation, I would like to add carboplatin with Taxol.  Return to clinic in 2 weeks for cycle 1 taxol- Carbo

## 2017-09-28 NOTE — Patient Instructions (Addendum)
[facility] Discharge Instructions for Patients Receiving Chemotherapy  Today you received the following chemotherapy agents: Carboplatin (Paraplatin) and Paclitaxel (Taxol)  To help prevent nausea and vomiting after your treatment, we encourage you to take your nausea medication as prescribed.  If you develop nausea and vomiting that is not controlled by your nausea medication, call the clinic.   BELOW ARE SYMPTOMS THAT SHOULD BE REPORTED IMMEDIATELY:  *FEVER GREATER THAN 100.5 F  *CHILLS WITH OR WITHOUT FEVER  NAUSEA AND VOMITING THAT IS NOT CONTROLLED WITH YOUR NAUSEA MEDICATION  *UNUSUAL SHORTNESS OF BREATH  *UNUSUAL BRUISING OR BLEEDING  TENDERNESS IN MOUTH AND THROAT WITH OR WITHOUT PRESENCE OF ULCERS  *URINARY PROBLEMS  *BOWEL PROBLEMS  UNUSUAL RASH Items with * indicate a potential emergency and should be followed up as soon as possible.  Feel free to call the clinic should you have any questions or concerns. The clinic phone number is [phone].  Please show the [facility] at check-in to the Emergency Department and triage nurse.    Carboplatin injection What is this medicine? CARBOPLATIN (KAR boe pla tin) is a chemotherapy drug. It targets fast dividing cells, like cancer cells, and causes these cells to die. This medicine is used to treat ovarian cancer and many other cancers. This medicine may be used for other purposes; ask your health care provider or pharmacist if you have questions. COMMON BRAND NAME(S): Paraplatin What should I tell my health care provider before I take this medicine? They need to know if you have any of these conditions: -blood disorders -hearing problems -kidney disease -recent or ongoing radiation therapy -an unusual or allergic reaction to carboplatin, cisplatin, other chemotherapy, other medicines, foods, dyes, or preservatives -pregnant or trying to get pregnant -breast-feeding How should I use this  medicine? This drug is usually given as an infusion into a vein. It is administered in a hospital or clinic by a specially trained health care professional. Talk to your pediatrician regarding the use of this medicine in children. Special care may be needed. Overdosage: If you think you have taken too much of this medicine contact a poison control center or emergency room at once. NOTE: This medicine is only for you. Do not share this medicine with others. What if I miss a dose? It is important not to miss a dose. Call your doctor or health care professional if you are unable to keep an appointment. What may interact with this medicine? -medicines for seizures -medicines to increase blood counts like filgrastim, pegfilgrastim, sargramostim -some antibiotics like amikacin, gentamicin, neomycin, streptomycin, tobramycin -vaccines Talk to your doctor or health care professional before taking any of these medicines: -acetaminophen -aspirin -ibuprofen -ketoprofen -naproxen This list may not describe all possible interactions. Give your health care provider a list of all the medicines, herbs, non-prescription drugs, or dietary supplements you use. Also tell them if you smoke, drink alcohol, or use illegal drugs. Some items may interact with your medicine. What should I watch for while using this medicine? Your condition will be monitored carefully while you are receiving this medicine. You will need important blood work done while you are taking this medicine. This drug may make you feel generally unwell. This is not uncommon, as chemotherapy can affect healthy cells as well as cancer cells. Report any side effects. Continue your course of treatment even though you feel ill unless your doctor tells you to stop. In some cases, you may be given additional medicines to help  with side effects. Follow all directions for their use. Call your doctor or health care professional for advice if you get a  fever, chills or sore throat, or other symptoms of a cold or flu. Do not treat yourself. This drug decreases your body's ability to fight infections. Try to avoid being around people who are sick. This medicine may increase your risk to bruise or bleed. Call your doctor or health care professional if you notice any unusual bleeding. Be careful brushing and flossing your teeth or using a toothpick because you may get an infection or bleed more easily. If you have any dental work done, tell your dentist you are receiving this medicine. Avoid taking products that contain aspirin, acetaminophen, ibuprofen, naproxen, or ketoprofen unless instructed by your doctor. These medicines may hide a fever. Do not become pregnant while taking this medicine. Women should inform their doctor if they wish to become pregnant or think they might be pregnant. There is a potential for serious side effects to an unborn child. Talk to your health care professional or pharmacist for more information. Do not breast-feed an infant while taking this medicine. What side effects may I notice from receiving this medicine? Side effects that you should report to your doctor or health care professional as soon as possible: -allergic reactions like skin rash, itching or hives, swelling of the face, lips, or tongue -signs of infection - fever or chills, cough, sore throat, pain or difficulty passing urine -signs of decreased platelets or bleeding - bruising, pinpoint red spots on the skin, black, tarry stools, nosebleeds -signs of decreased red blood cells - unusually weak or tired, fainting spells, lightheadedness -breathing problems -changes in hearing -changes in vision -chest pain -high blood pressure -low blood counts - This drug may decrease the number of white blood cells, red blood cells and platelets. You may be at increased risk for infections and bleeding. -nausea and vomiting -pain, swelling, redness or irritation at the  injection site -pain, tingling, numbness in the hands or feet -problems with balance, talking, walking -trouble passing urine or change in the amount of urine Side effects that usually do not require medical attention (report to your doctor or health care professional if they continue or are bothersome): -hair loss -loss of appetite -metallic taste in the mouth or changes in taste This list may not describe all possible side effects. Call your doctor for medical advice about side effects. You may report side effects to FDA at 1-800-FDA-1088. Where should I keep my medicine? This drug is given in a hospital or clinic and will not be stored at home. NOTE: This sheet is a summary. It may not cover all possible information. If you have questions about this medicine, talk to your doctor, pharmacist, or health care provider.  2018 Elsevier/Gold Standard (2008-01-22 14:38:05) Paclitaxel injection What is this medicine? PACLITAXEL (PAK li TAX el) is a chemotherapy drug. It targets fast dividing cells, like cancer cells, and causes these cells to die. This medicine is used to treat ovarian cancer, breast cancer, and other cancers. This medicine may be used for other purposes; ask your health care provider or pharmacist if you have questions. COMMON BRAND NAME(S): Onxol, Taxol What should I tell my health care provider before I take this medicine? They need to know if you have any of these conditions: -blood disorders -irregular heartbeat -infection (especially a virus infection such as chickenpox, cold sores, or herpes) -liver disease -previous or ongoing radiation therapy -an unusual or  allergic reaction to paclitaxel, alcohol, polyoxyethylated castor oil, other chemotherapy agents, other medicines, foods, dyes, or preservatives -pregnant or trying to get pregnant -breast-feeding How should I use this medicine? This drug is given as an infusion into a vein. It is administered in a hospital or  clinic by a specially trained health care professional. Talk to your pediatrician regarding the use of this medicine in children. Special care may be needed. Overdosage: If you think you have taken too much of this medicine contact a poison control center or emergency room at once. NOTE: This medicine is only for you. Do not share this medicine with others. What if I miss a dose? It is important not to miss your dose. Call your doctor or health care professional if you are unable to keep an appointment. What may interact with this medicine? Do not take this medicine with any of the following medications: -disulfiram -metronidazole This medicine may also interact with the following medications: -cyclosporine -diazepam -ketoconazole -medicines to increase blood counts like filgrastim, pegfilgrastim, sargramostim -other chemotherapy drugs like cisplatin, doxorubicin, epirubicin, etoposide, teniposide, vincristine -quinidine -testosterone -vaccines -verapamil Talk to your doctor or health care professional before taking any of these medicines: -acetaminophen -aspirin -ibuprofen -ketoprofen -naproxen This list may not describe all possible interactions. Give your health care provider a list of all the medicines, herbs, non-prescription drugs, or dietary supplements you use. Also tell them if you smoke, drink alcohol, or use illegal drugs. Some items may interact with your medicine. What should I watch for while using this medicine? Your condition will be monitored carefully while you are receiving this medicine. You will need important blood work done while you are taking this medicine. This medicine can cause serious allergic reactions. To reduce your risk you will need to take other medicine(s) before treatment with this medicine. If you experience allergic reactions like skin rash, itching or hives, swelling of the face, lips, or tongue, tell your doctor or health care professional right  away. In some cases, you may be given additional medicines to help with side effects. Follow all directions for their use. This drug may make you feel generally unwell. This is not uncommon, as chemotherapy can affect healthy cells as well as cancer cells. Report any side effects. Continue your course of treatment even though you feel ill unless your doctor tells you to stop. Call your doctor or health care professional for advice if you get a fever, chills or sore throat, or other symptoms of a cold or flu. Do not treat yourself. This drug decreases your body's ability to fight infections. Try to avoid being around people who are sick. This medicine may increase your risk to bruise or bleed. Call your doctor or health care professional if you notice any unusual bleeding. Be careful brushing and flossing your teeth or using a toothpick because you may get an infection or bleed more easily. If you have any dental work done, tell your dentist you are receiving this medicine. Avoid taking products that contain aspirin, acetaminophen, ibuprofen, naproxen, or ketoprofen unless instructed by your doctor. These medicines may hide a fever. Do not become pregnant while taking this medicine. Women should inform their doctor if they wish to become pregnant or think they might be pregnant. There is a potential for serious side effects to an unborn child. Talk to your health care professional or pharmacist for more information. Do not breast-feed an infant while taking this medicine. Men are advised not to father a child  while receiving this medicine. This product may contain alcohol. Ask your pharmacist or healthcare provider if this medicine contains alcohol. Be sure to tell all healthcare providers you are taking this medicine. Certain medicines, like metronidazole and disulfiram, can cause an unpleasant reaction when taken with alcohol. The reaction includes flushing, headache, nausea, vomiting, sweating, and  increased thirst. The reaction can last from 30 minutes to several hours. What side effects may I notice from receiving this medicine? Side effects that you should report to your doctor or health care professional as soon as possible: -allergic reactions like skin rash, itching or hives, swelling of the face, lips, or tongue -low blood counts - This drug may decrease the number of white blood cells, red blood cells and platelets. You may be at increased risk for infections and bleeding. -signs of infection - fever or chills, cough, sore throat, pain or difficulty passing urine -signs of decreased platelets or bleeding - bruising, pinpoint red spots on the skin, black, tarry stools, nosebleeds -signs of decreased red blood cells - unusually weak or tired, fainting spells, lightheadedness -breathing problems -chest pain -high or low blood pressure -mouth sores -nausea and vomiting -pain, swelling, redness or irritation at the injection site -pain, tingling, numbness in the hands or feet -slow or irregular heartbeat -swelling of the ankle, feet, hands Side effects that usually do not require medical attention (report to your doctor or health care professional if they continue or are bothersome): -bone pain -complete hair loss including hair on your head, underarms, pubic hair, eyebrows, and eyelashes -changes in the color of fingernails -diarrhea -loosening of the fingernails -loss of appetite -muscle or joint pain -red flush to skin -sweating This list may not describe all possible side effects. Call your doctor for medical advice about side effects. You may report side effects to FDA at 1-800-FDA-1088. Where should I keep my medicine? This drug is given in a hospital or clinic and will not be stored at home. NOTE: This sheet is a summary. It may not cover all possible information. If you have questions about this medicine, talk to your doctor, pharmacist, or health care provider.  2018  Elsevier/Gold Standard (2015-08-18 19:58:00)

## 2017-10-05 ENCOUNTER — Other Ambulatory Visit (HOSPITAL_BASED_OUTPATIENT_CLINIC_OR_DEPARTMENT_OTHER): Payer: 59

## 2017-10-05 ENCOUNTER — Ambulatory Visit (HOSPITAL_BASED_OUTPATIENT_CLINIC_OR_DEPARTMENT_OTHER): Payer: 59

## 2017-10-05 ENCOUNTER — Ambulatory Visit: Payer: 59

## 2017-10-05 ENCOUNTER — Ambulatory Visit (HOSPITAL_BASED_OUTPATIENT_CLINIC_OR_DEPARTMENT_OTHER): Payer: 59 | Admitting: Hematology and Oncology

## 2017-10-05 DIAGNOSIS — K5900 Constipation, unspecified: Secondary | ICD-10-CM

## 2017-10-05 DIAGNOSIS — Z171 Estrogen receptor negative status [ER-]: Principal | ICD-10-CM

## 2017-10-05 DIAGNOSIS — Z5111 Encounter for antineoplastic chemotherapy: Secondary | ICD-10-CM | POA: Diagnosis not present

## 2017-10-05 DIAGNOSIS — C50411 Malignant neoplasm of upper-outer quadrant of right female breast: Secondary | ICD-10-CM

## 2017-10-05 DIAGNOSIS — R140 Abdominal distension (gaseous): Secondary | ICD-10-CM

## 2017-10-05 LAB — COMPREHENSIVE METABOLIC PANEL
ALT: 20 U/L (ref 0–55)
ANION GAP: 9 meq/L (ref 3–11)
AST: 17 U/L (ref 5–34)
Albumin: 4.1 g/dL (ref 3.5–5.0)
Alkaline Phosphatase: 51 U/L (ref 40–150)
BUN: 14.3 mg/dL (ref 7.0–26.0)
CO2: 25 meq/L (ref 22–29)
Calcium: 9.7 mg/dL (ref 8.4–10.4)
Chloride: 106 mEq/L (ref 98–109)
Creatinine: 0.8 mg/dL (ref 0.6–1.1)
GLUCOSE: 114 mg/dL (ref 70–140)
POTASSIUM: 3.8 meq/L (ref 3.5–5.1)
SODIUM: 141 meq/L (ref 136–145)
Total Bilirubin: 0.37 mg/dL (ref 0.20–1.20)
Total Protein: 7 g/dL (ref 6.4–8.3)

## 2017-10-05 LAB — CBC WITH DIFFERENTIAL/PLATELET
BASO%: 1.1 % (ref 0.0–2.0)
Basophils Absolute: 0 10*3/uL (ref 0.0–0.1)
EOS ABS: 0.1 10*3/uL (ref 0.0–0.5)
EOS%: 1.3 % (ref 0.0–7.0)
HCT: 30.4 % — ABNORMAL LOW (ref 34.8–46.6)
HGB: 10.1 g/dL — ABNORMAL LOW (ref 11.6–15.9)
LYMPH%: 27.7 % (ref 14.0–49.7)
MCH: 32.1 pg (ref 25.1–34.0)
MCHC: 33.2 g/dL (ref 31.5–36.0)
MCV: 96.5 fL (ref 79.5–101.0)
MONO#: 0.3 10*3/uL (ref 0.1–0.9)
MONO%: 7.9 % (ref 0.0–14.0)
NEUT%: 62 % (ref 38.4–76.8)
NEUTROS ABS: 2.4 10*3/uL (ref 1.5–6.5)
PLATELETS: 251 10*3/uL (ref 145–400)
RBC: 3.15 10*6/uL — ABNORMAL LOW (ref 3.70–5.45)
RDW: 16 % — AB (ref 11.2–14.5)
WBC: 3.8 10*3/uL — AB (ref 3.9–10.3)
lymph#: 1.1 10*3/uL (ref 0.9–3.3)

## 2017-10-05 MED ORDER — SODIUM CHLORIDE 0.9 % IV SOLN
50.0000 mg/m2 | Freq: Once | INTRAVENOUS | Status: AC
  Administered 2017-10-05: 96 mg via INTRAVENOUS
  Filled 2017-10-05: qty 16

## 2017-10-05 MED ORDER — HEPARIN SOD (PORK) LOCK FLUSH 100 UNIT/ML IV SOLN
500.0000 [IU] | Freq: Once | INTRAVENOUS | Status: AC | PRN
Start: 2017-10-05 — End: 2017-10-05
  Administered 2017-10-05: 500 [IU]
  Filled 2017-10-05: qty 5

## 2017-10-05 MED ORDER — SODIUM CHLORIDE 0.9% FLUSH
10.0000 mL | INTRAVENOUS | Status: DC | PRN
  Administered 2017-10-05: 10 mL
  Filled 2017-10-05: qty 10

## 2017-10-05 MED ORDER — DIPHENHYDRAMINE HCL 50 MG/ML IJ SOLN
50.0000 mg | Freq: Once | INTRAMUSCULAR | Status: AC
  Administered 2017-10-05: 50 mg via INTRAVENOUS

## 2017-10-05 MED ORDER — FAMOTIDINE IN NACL 20-0.9 MG/50ML-% IV SOLN
20.0000 mg | Freq: Once | INTRAVENOUS | Status: AC
  Administered 2017-10-05: 20 mg via INTRAVENOUS

## 2017-10-05 MED ORDER — FAMOTIDINE IN NACL 20-0.9 MG/50ML-% IV SOLN
INTRAVENOUS | Status: AC
  Filled 2017-10-05: qty 50

## 2017-10-05 MED ORDER — SODIUM CHLORIDE 0.9 % IV SOLN
262.8000 mg | Freq: Once | INTRAVENOUS | Status: AC
  Administered 2017-10-05: 260 mg via INTRAVENOUS
  Filled 2017-10-05: qty 26

## 2017-10-05 MED ORDER — DIPHENHYDRAMINE HCL 50 MG/ML IJ SOLN
INTRAMUSCULAR | Status: AC
  Filled 2017-10-05: qty 1

## 2017-10-05 MED ORDER — DEXAMETHASONE SODIUM PHOSPHATE 100 MG/10ML IJ SOLN
20.0000 mg | Freq: Once | INTRAMUSCULAR | Status: AC
  Administered 2017-10-05: 20 mg via INTRAVENOUS
  Filled 2017-10-05: qty 2

## 2017-10-05 MED ORDER — SODIUM CHLORIDE 0.9 % IV SOLN
Freq: Once | INTRAVENOUS | Status: AC
  Administered 2017-10-05: 13:00:00 via INTRAVENOUS

## 2017-10-05 MED ORDER — SODIUM CHLORIDE 0.9% FLUSH
10.0000 mL | INTRAVENOUS | Status: DC | PRN
  Administered 2017-10-05: 10 mL via INTRAVENOUS
  Filled 2017-10-05: qty 10

## 2017-10-05 NOTE — Assessment & Plan Note (Signed)
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 Neoadjuvant chemotherapy with Adriamycin and Cytoxan dose dense 4 followed by Taxol weekly 12 2. Followed bybilateral mastectomies with reconstructionwith sentinel lymph node study + targeted axillary dissection 3. 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

## 2017-10-05 NOTE — Patient Instructions (Signed)
[facility] Discharge Instructions for Patients Receiving Chemotherapy  Today you received the following chemotherapy agents:  Carboplatin, Taxol  To help prevent nausea and vomiting after your treatment, we encourage you to take your nausea medication as prescribed.   If you develop nausea and vomiting that is not controlled by your nausea medication, call the clinic.   BELOW ARE SYMPTOMS THAT SHOULD BE REPORTED IMMEDIATELY:  *FEVER GREATER THAN 100.5 F  *CHILLS WITH OR WITHOUT FEVER  NAUSEA AND VOMITING THAT IS NOT CONTROLLED WITH YOUR NAUSEA MEDICATION  *UNUSUAL SHORTNESS OF BREATH  *UNUSUAL BRUISING OR BLEEDING  TENDERNESS IN MOUTH AND THROAT WITH OR WITHOUT PRESENCE OF ULCERS  *URINARY PROBLEMS  *BOWEL PROBLEMS  UNUSUAL RASH Items with * indicate a potential emergency and should be followed up as soon as possible.  Feel free to call the clinic should you have any questions or concerns. The clinic phone number is [phone].  Please show the CHEMO ALERT CARD at check-in to the Emergency Department and triage nurse.   

## 2017-10-05 NOTE — Progress Notes (Signed)
Patient Care Team: Cari Caraway, MD as PCP - General (Family Medicine) Christophe Louis, MD as Consulting Physician (Obstetrics and Gynecology) Alphonsa Overall, MD as Consulting Physician (General Surgery) Nicholas Lose, MD as Consulting Physician (Hematology and Oncology) Eppie Gibson, MD as Attending Physician (Radiation Oncology)  DIAGNOSIS:  Encounter Diagnosis  Name Primary?  . 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  The Common Hereditary Cancers Panel was ordered.  The Hereditary Gene Panel offered by [facility] includes sequencing and/or deletion duplication testing of the following 46 genes: APC, ATM, AXIN2, BARD1, BMPR1A, BRCA1, BRCA2, BRIP1, CDH1, CDKN2A (p14ARF), CDKN2A (p16INK4a), CHEK2, CTNNA1, DICER1, EPCAM (Deletion/duplication testing only), GREM1 (promoter region deletion/duplication testing only), KIT, MEN1, MLH1, MSH2, MSH3, MSH6, MUTYH, NBN, NF1, NHTL1, PALB2, PDGFRA, PMS2, POLD1, POLE, PTEN, RAD50, RAD51C, RAD51D, SDHB, SDHC, SDHD, SMAD4, SMARCA4. STK11, TP53, TSC1, TSC2, and VHL.  The following genes were evaluated for sequence changes only: SDHA and HOXB13 c.251G>A variant only.       Results:  POSITIVE for a mutation in BRCA2 [id number] (p.Asp1540Glyfs*6).  The date of this test report is 08/18/2017.       CHIEF COMPLIANT: Cycle 2 Taxol carboplatin  INTERVAL HISTORY: Kara Crawford is a 51 year old with above-mentioned history of right breast cancer currently on neoadjuvant chemotherapy today cycle 2 of Taxol and carboplatin.  She tolerated cycle 1 extremely well.  Her major complaints are related to constipation and bloating sensation.  She denied any nausea or vomiting.  Denies any fevers or chills.  REVIEW OF SYSTEMS:   Constitutional: Denies fevers, chills or abnormal weight loss Eyes: Denies blurriness of vision Ears, nose, mouth, throat, and face: Denies mucositis or sore throat Respiratory: Denies cough, dyspnea or wheezes Cardiovascular: Denies palpitation, chest discomfort Gastrointestinal: Bloating Skin: Denies abnormal skin rashes Lymphatics: Denies new lymphadenopathy or easy bruising Neurological:Denies numbness, tingling or new weaknesses Behavioral/Psych: Mood is stable, no new changes  Extremities: No lower extremity edema  All other systems were reviewed with the patient and are negative.  I have reviewed the past medical history, past surgical history, social history and family history with the patient and they are unchanged from previous note.  ALLERGIES:  has No Known Allergies.  MEDICATIONS:  Current Outpatient Medications  Medication Sig Dispense Refill  . acetaminophen (TYLENOL) 325 MG tablet Take 650 mg by mouth as needed.    Marland Kitchen amLODipine (NORVASC) 10 MG tablet Take 10 mg by mouth every morning.     Marland Kitchen atorvastatin (LIPITOR) 80 MG tablet Take 80 mg by mouth every morning.     . Calcium Carbonate-Vitamin D (CALCIUM 500/D PO) Take 2 tablets by mouth daily.    . Cholecalciferol (VITAMIN D3 PO) Take by mouth.    Marland Kitchen  Cyanocobalamin (VITAMIN B-12 PO) Take 1 tablet by mouth every morning.    Marland Kitchen HYDROcodone-acetaminophen (NORCO/VICODIN) 5-325 MG  tablet Take 1-2 tablets by mouth every 6 (six) hours as needed for moderate pain. 15 tablet 0  . lidocaine-prilocaine (EMLA) cream Apply to affected area once 30 g 3  . lisinopril-hydrochlorothiazide (PRINZIDE,ZESTORETIC) 20-12.5 MG per tablet Take 2 tablets by mouth daily.     Marland Kitchen LORazepam (ATIVAN) 0.5 MG tablet Take 1 tablet (0.5 mg total) by mouth every 6 (six) hours as needed (Nausea or vomiting). 30 tablet 0  . Melatonin 10 MG CAPS Take 1 tablet by mouth at bedtime.    . metoprolol succinate (TOPROL-XL) 25 MG 24 hr tablet Take 25 mg by mouth every morning.     . ondansetron (ZOFRAN) 8 MG tablet Take 1 tablet (8 mg total) by mouth 2 (two) times daily as needed. Start on the third day after chemotherapy. 30 tablet 1  . pantoprazole (PROTONIX) 40 MG tablet Take 40 mg by mouth daily.    . prochlorperazine (COMPAZINE) 10 MG tablet TAKE 1 TABLET(10 MG) BY MOUTH EVERY 6 HOURS AS NEEDED FOR NAUSEA OR VOMITING 385 tablet 1  . sitaGLIPtin-metformin (JANUMET) 50-500 MG per tablet Take 1 tablet by mouth 2 (two) times daily with a meal.      No current facility-administered medications for this visit.    Facility-Administered Medications Ordered in Other Visits  Medication Dose Route Frequency Provider Last Rate Last Dose  . sodium chloride flush (NS) 0.9 % injection 10 mL  10 mL Intravenous PRN Nicholas Lose, MD   10 mL at 08/03/17 1516  . sodium chloride flush (NS) 0.9 % injection 10 mL  10 mL Intravenous PRN Nicholas Lose, MD   10 mL at 08/17/17 1531    PHYSICAL EXAMINATION: ECOG PERFORMANCE STATUS: 1 - Symptomatic but completely ambulatory  Vitals:   10/05/17 1132  BP: 125/80  Pulse: 87  Resp: 18  Temp: 98.2 F (36.8 C)  SpO2: 100%   Filed Weights   10/05/17 1132  Weight: 182 lb 6.4 oz (82.7 kg)    GENERAL:alert, no distress and comfortable SKIN: skin color, texture, turgor are normal, no rashes or significant lesions EYES: normal, Conjunctiva are pink and non-injected, sclera  clear OROPHARYNX:no exudate, no erythema and lips, buccal mucosa, and tongue normal  NECK: supple, thyroid normal size, non-tender, without nodularity LYMPH:  no palpable lymphadenopathy in the cervical, axillary or inguinal LUNGS: clear to auscultation and percussion with normal breathing effort HEART: regular rate & rhythm and no murmurs and no lower extremity edema ABDOMEN:abdomen soft, non-tender and normal bowel sounds MUSCULOSKELETAL:no cyanosis of digits and no clubbing  NEURO: alert & oriented x 3 with fluent speech, no focal motor/sensory deficits EXTREMITIES: No lower extremity edema  LABORATORY DATA:  I have reviewed the data as listed   Chemistry      Component Value Date/Time   NA 141 09/28/2017 0957   K 3.6 09/28/2017 0957   CL 102 05/15/2015 0407   CO2 28 09/28/2017 0957   BUN 12.8 09/28/2017 0957   CREATININE 0.9 09/28/2017 0957      Component Value Date/Time   CALCIUM 9.9 09/28/2017 0957   ALKPHOS 70 09/28/2017 0957   AST 16 09/28/2017 0957   ALT 14 09/28/2017 0957   BILITOT 0.41 09/28/2017 0957       Lab Results  Component Value Date   WBC 3.8 (L) 10/05/2017   HGB 10.1 (L) 10/05/2017   HCT 30.4 (L)  10/05/2017   MCV 96.5 10/05/2017   PLT 251 10/05/2017   NEUTROABS 2.4 10/05/2017    ASSESSMENT & PLAN:  Malignant neoplasm of upper-outer quadrant of right breast in female, estrogen receptor negative ([place]) 07/17/2017: Right upper outer quadrant painful palpable right breast mass by ultrasound measured 1 cm with 3 abnormal axillary lymph nodes 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 Neoadjuvant chemotherapy with Adriamycin and Cytoxan dose dense 4 followed by Taxol weekly 12 2. Followed bybilateral mastectomies with reconstructionwith sentinel lymph node study + targeted axillary dissection 3. 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 Monitoring closely for the neutropenia.  If her neutrophil count drops below thousand then we will have to give her Neupogen injection day before chemo to boost her numbers up.  Return to clinic in 2 weeks for cycle 4 taxol- Carbo   I spent 25 minutes talking to the patient of which more than half was spent in counseling and coordination of care.  No orders of the defined types were placed in this encounter.  The patient has a good understanding of the overall plan. she agrees with it. she will call with any problems that may develop before the next visit here.   Rulon Eisenmenger, MD 10/05/17

## 2017-10-12 ENCOUNTER — Other Ambulatory Visit (HOSPITAL_BASED_OUTPATIENT_CLINIC_OR_DEPARTMENT_OTHER): Payer: 59

## 2017-10-12 ENCOUNTER — Ambulatory Visit: Payer: 59

## 2017-10-12 ENCOUNTER — Ambulatory Visit (HOSPITAL_BASED_OUTPATIENT_CLINIC_OR_DEPARTMENT_OTHER): Payer: 59

## 2017-10-12 DIAGNOSIS — Z171 Estrogen receptor negative status [ER-]: Principal | ICD-10-CM

## 2017-10-12 DIAGNOSIS — C50411 Malignant neoplasm of upper-outer quadrant of right female breast: Secondary | ICD-10-CM

## 2017-10-12 DIAGNOSIS — Z5111 Encounter for antineoplastic chemotherapy: Secondary | ICD-10-CM

## 2017-10-12 LAB — CBC WITH DIFFERENTIAL/PLATELET
BASO%: 0.3 % (ref 0.0–2.0)
Basophils Absolute: 0 10*3/uL (ref 0.0–0.1)
EOS ABS: 0.1 10*3/uL (ref 0.0–0.5)
EOS%: 1.7 % (ref 0.0–7.0)
HCT: 31.7 % — ABNORMAL LOW (ref 34.8–46.6)
HEMOGLOBIN: 10.5 g/dL — AB (ref 11.6–15.9)
LYMPH%: 27.1 % (ref 14.0–49.7)
MCH: 32.3 pg (ref 25.1–34.0)
MCHC: 33.1 g/dL (ref 31.5–36.0)
MCV: 97.5 fL (ref 79.5–101.0)
MONO#: 0.3 10*3/uL (ref 0.1–0.9)
MONO%: 9.3 % (ref 0.0–14.0)
NEUT%: 61.6 % (ref 38.4–76.8)
NEUTROS ABS: 2.1 10*3/uL (ref 1.5–6.5)
PLATELETS: 232 10*3/uL (ref 145–400)
RBC: 3.25 10*6/uL — AB (ref 3.70–5.45)
RDW: 16.3 % — ABNORMAL HIGH (ref 11.2–14.5)
WBC: 3.4 10*3/uL — AB (ref 3.9–10.3)
lymph#: 0.9 10*3/uL (ref 0.9–3.3)

## 2017-10-12 LAB — COMPREHENSIVE METABOLIC PANEL
ALT: 28 U/L (ref 0–55)
AST: 22 U/L (ref 5–34)
Albumin: 4.1 g/dL (ref 3.5–5.0)
Alkaline Phosphatase: 51 U/L (ref 40–150)
Anion Gap: 10 mEq/L (ref 3–11)
BUN: 15.3 mg/dL (ref 7.0–26.0)
CALCIUM: 9.9 mg/dL (ref 8.4–10.4)
CHLORIDE: 105 meq/L (ref 98–109)
CO2: 24 meq/L (ref 22–29)
Creatinine: 0.8 mg/dL (ref 0.6–1.1)
EGFR: >60 mL/min/{1.73_m2} (ref >60)
Glucose: 109 mg/dl (ref 70–140)
POTASSIUM: 3.6 meq/L (ref 3.5–5.1)
SODIUM: 139 meq/L (ref 136–145)
Total Bilirubin: 0.36 mg/dL (ref 0.20–1.20)
Total Protein: 7 g/dL (ref 6.4–8.3)

## 2017-10-12 MED ORDER — SODIUM CHLORIDE 0.9 % IV SOLN
50.0000 mg/m2 | Freq: Once | INTRAVENOUS | Status: AC
  Administered 2017-10-12: 96 mg via INTRAVENOUS
  Filled 2017-10-12: qty 16

## 2017-10-12 MED ORDER — SODIUM CHLORIDE 0.9 % IV SOLN
20.0000 mg | Freq: Once | INTRAVENOUS | Status: AC
  Administered 2017-10-12: 20 mg via INTRAVENOUS
  Filled 2017-10-12: qty 2

## 2017-10-12 MED ORDER — FAMOTIDINE IN NACL 20-0.9 MG/50ML-% IV SOLN
20.0000 mg | Freq: Once | INTRAVENOUS | Status: AC
  Administered 2017-10-12: 20 mg via INTRAVENOUS

## 2017-10-12 MED ORDER — SODIUM CHLORIDE 0.9% FLUSH
10.0000 mL | INTRAVENOUS | Status: DC | PRN
  Administered 2017-10-12: 10 mL via INTRAVENOUS
  Filled 2017-10-12: qty 10

## 2017-10-12 MED ORDER — DIPHENHYDRAMINE HCL 50 MG/ML IJ SOLN
50.0000 mg | Freq: Once | INTRAMUSCULAR | Status: AC
  Administered 2017-10-12: 50 mg via INTRAVENOUS

## 2017-10-12 MED ORDER — SODIUM CHLORIDE 0.9 % IV SOLN
Freq: Once | INTRAVENOUS | Status: AC
  Administered 2017-10-12: 10:00:00 via INTRAVENOUS

## 2017-10-12 MED ORDER — FAMOTIDINE IN NACL 20-0.9 MG/50ML-% IV SOLN
INTRAVENOUS | Status: AC
  Filled 2017-10-12: qty 50

## 2017-10-12 MED ORDER — HEPARIN SOD (PORK) LOCK FLUSH 100 UNIT/ML IV SOLN
500.0000 [IU] | Freq: Once | INTRAVENOUS | Status: AC | PRN
  Administered 2017-10-12: 500 [IU]
  Filled 2017-10-12: qty 5

## 2017-10-12 MED ORDER — SODIUM CHLORIDE 0.9 % IV SOLN
262.8000 mg | Freq: Once | INTRAVENOUS | Status: AC
  Administered 2017-10-12: 260 mg via INTRAVENOUS
  Filled 2017-10-12: qty 26

## 2017-10-12 MED ORDER — DIPHENHYDRAMINE HCL 50 MG/ML IJ SOLN
INTRAMUSCULAR | Status: AC
  Filled 2017-10-12: qty 1

## 2017-10-12 MED ORDER — SODIUM CHLORIDE 0.9% FLUSH
10.0000 mL | INTRAVENOUS | Status: DC | PRN
  Administered 2017-10-12: 10 mL
  Filled 2017-10-12: qty 10

## 2017-10-12 NOTE — Patient Instructions (Signed)
[facility] Discharge Instructions for Patients Receiving Chemotherapy  Today you received the following chemotherapy agents:  Carboplatin, Taxol  To help prevent nausea and vomiting after your treatment, we encourage you to take your nausea medication as prescribed.   If you develop nausea and vomiting that is not controlled by your nausea medication, call the clinic.   BELOW ARE SYMPTOMS THAT SHOULD BE REPORTED IMMEDIATELY:  *FEVER GREATER THAN 100.5 F  *CHILLS WITH OR WITHOUT FEVER  NAUSEA AND VOMITING THAT IS NOT CONTROLLED WITH YOUR NAUSEA MEDICATION  *UNUSUAL SHORTNESS OF BREATH  *UNUSUAL BRUISING OR BLEEDING  TENDERNESS IN MOUTH AND THROAT WITH OR WITHOUT PRESENCE OF ULCERS  *URINARY PROBLEMS  *BOWEL PROBLEMS  UNUSUAL RASH Items with * indicate a potential emergency and should be followed up as soon as possible.  Feel free to call the clinic should you have any questions or concerns. The clinic phone number is [phone].  Please show the CHEMO ALERT CARD at check-in to the Emergency Department and triage nurse.   

## 2017-10-18 DIAGNOSIS — C50911 Malignant neoplasm of unspecified site of right female breast: Secondary | ICD-10-CM | POA: Diagnosis not present

## 2017-10-19 ENCOUNTER — Ambulatory Visit (HOSPITAL_BASED_OUTPATIENT_CLINIC_OR_DEPARTMENT_OTHER): Payer: 59

## 2017-10-19 ENCOUNTER — Encounter: Payer: Self-pay | Admitting: Adult Health

## 2017-10-19 ENCOUNTER — Other Ambulatory Visit (HOSPITAL_BASED_OUTPATIENT_CLINIC_OR_DEPARTMENT_OTHER): Payer: 59

## 2017-10-19 ENCOUNTER — Ambulatory Visit (HOSPITAL_BASED_OUTPATIENT_CLINIC_OR_DEPARTMENT_OTHER): Payer: 59 | Admitting: Adult Health

## 2017-10-19 ENCOUNTER — Ambulatory Visit: Payer: 59

## 2017-10-19 VITALS — BP 113/69 | HR 87 | Temp 98.7°F | Resp 18 | Ht 61.0 in | Wt 180.9 lb

## 2017-10-19 DIAGNOSIS — Z171 Estrogen receptor negative status [ER-]: Secondary | ICD-10-CM | POA: Diagnosis not present

## 2017-10-19 DIAGNOSIS — C50411 Malignant neoplasm of upper-outer quadrant of right female breast: Secondary | ICD-10-CM | POA: Diagnosis not present

## 2017-10-19 DIAGNOSIS — Z5111 Encounter for antineoplastic chemotherapy: Secondary | ICD-10-CM | POA: Diagnosis not present

## 2017-10-19 DIAGNOSIS — C773 Secondary and unspecified malignant neoplasm of axilla and upper limb lymph nodes: Secondary | ICD-10-CM | POA: Diagnosis not present

## 2017-10-19 LAB — CBC WITH DIFFERENTIAL/PLATELET
BASO%: 0.8 % (ref 0.0–2.0)
BASOS ABS: 0 10*3/uL (ref 0.0–0.1)
EOS ABS: 0.1 10*3/uL (ref 0.0–0.5)
EOS%: 1.6 % (ref 0.0–7.0)
HEMATOCRIT: 31.4 % — AB (ref 34.8–46.6)
HGB: 10.5 g/dL — ABNORMAL LOW (ref 11.6–15.9)
LYMPH#: 1 10*3/uL (ref 0.9–3.3)
LYMPH%: 27.8 % (ref 14.0–49.7)
MCH: 33.1 pg (ref 25.1–34.0)
MCHC: 33.4 g/dL (ref 31.5–36.0)
MCV: 99.1 fL (ref 79.5–101.0)
MONO#: 0.3 10*3/uL (ref 0.1–0.9)
MONO%: 7.2 % (ref 0.0–14.0)
NEUT#: 2.3 10*3/uL (ref 1.5–6.5)
NEUT%: 62.6 % (ref 38.4–76.8)
PLATELETS: 246 10*3/uL (ref 145–400)
RBC: 3.17 10*6/uL — ABNORMAL LOW (ref 3.70–5.45)
RDW: 16.1 % — ABNORMAL HIGH (ref 11.2–14.5)
WBC: 3.7 10*3/uL — ABNORMAL LOW (ref 3.9–10.3)

## 2017-10-19 LAB — COMPREHENSIVE METABOLIC PANEL
ALT: 22 U/L (ref 0–55)
AST: 19 U/L (ref 5–34)
Albumin: 4.1 g/dL (ref 3.5–5.0)
Alkaline Phosphatase: 53 U/L (ref 40–150)
Anion Gap: 10 mEq/L (ref 3–11)
BILIRUBIN TOTAL: 0.43 mg/dL (ref 0.20–1.20)
BUN: 17.5 mg/dL (ref 7.0–26.0)
CO2: 26 meq/L (ref 22–29)
Calcium: 9.7 mg/dL (ref 8.4–10.4)
Chloride: 104 mEq/L (ref 98–109)
Creatinine: 1 mg/dL (ref 0.6–1.1)
GLUCOSE: 139 mg/dL (ref 70–140)
POTASSIUM: 3.6 meq/L (ref 3.5–5.1)
SODIUM: 141 meq/L (ref 136–145)
TOTAL PROTEIN: 7.3 g/dL (ref 6.4–8.3)

## 2017-10-19 MED ORDER — SODIUM CHLORIDE 0.9 % IV SOLN
50.0000 mg/m2 | Freq: Once | INTRAVENOUS | Status: AC
  Administered 2017-10-19: 96 mg via INTRAVENOUS
  Filled 2017-10-19: qty 16

## 2017-10-19 MED ORDER — DIPHENHYDRAMINE HCL 50 MG/ML IJ SOLN
INTRAMUSCULAR | Status: AC
  Filled 2017-10-19: qty 1

## 2017-10-19 MED ORDER — SODIUM CHLORIDE 0.9% FLUSH
10.0000 mL | INTRAVENOUS | Status: DC | PRN
  Administered 2017-10-19: 10 mL
  Filled 2017-10-19: qty 10

## 2017-10-19 MED ORDER — SODIUM CHLORIDE 0.9 % IV SOLN
Freq: Once | INTRAVENOUS | Status: AC
  Administered 2017-10-19: 10:00:00 via INTRAVENOUS

## 2017-10-19 MED ORDER — FAMOTIDINE IN NACL 20-0.9 MG/50ML-% IV SOLN
INTRAVENOUS | Status: AC
  Filled 2017-10-19: qty 50

## 2017-10-19 MED ORDER — FAMOTIDINE IN NACL 20-0.9 MG/50ML-% IV SOLN
20.0000 mg | Freq: Once | INTRAVENOUS | Status: AC
  Administered 2017-10-19: 20 mg via INTRAVENOUS

## 2017-10-19 MED ORDER — SODIUM CHLORIDE 0.9 % IV SOLN
20.0000 mg | Freq: Once | INTRAVENOUS | Status: AC
  Administered 2017-10-19: 20 mg via INTRAVENOUS
  Filled 2017-10-19: qty 2

## 2017-10-19 MED ORDER — HEPARIN SOD (PORK) LOCK FLUSH 100 UNIT/ML IV SOLN
500.0000 [IU] | Freq: Once | INTRAVENOUS | Status: AC | PRN
  Administered 2017-10-19: 500 [IU]
  Filled 2017-10-19: qty 5

## 2017-10-19 MED ORDER — SODIUM CHLORIDE 0.9% FLUSH
10.0000 mL | INTRAVENOUS | Status: DC | PRN
  Administered 2017-10-19: 10 mL via INTRAVENOUS
  Filled 2017-10-19: qty 10

## 2017-10-19 MED ORDER — DIPHENHYDRAMINE HCL 50 MG/ML IJ SOLN
50.0000 mg | Freq: Once | INTRAMUSCULAR | Status: AC
  Administered 2017-10-19: 50 mg via INTRAVENOUS

## 2017-10-19 MED ORDER — SODIUM CHLORIDE 0.9 % IV SOLN
220.0000 mg | Freq: Once | INTRAVENOUS | Status: AC
  Administered 2017-10-19: 220 mg via INTRAVENOUS
  Filled 2017-10-19: qty 22

## 2017-10-19 NOTE — Patient Instructions (Signed)
[facility] Discharge Instructions for Patients Receiving Chemotherapy  Today you received the following chemotherapy agents taxol/carboplatin  To help prevent nausea and vomiting after your treatment, we encourage you to take your nausea medication as directed   If you develop nausea and vomiting that is not controlled by your nausea medication, call the clinic.   BELOW ARE SYMPTOMS THAT SHOULD BE REPORTED IMMEDIATELY:  *FEVER GREATER THAN 100.5 F  *CHILLS WITH OR WITHOUT FEVER  NAUSEA AND VOMITING THAT IS NOT CONTROLLED WITH YOUR NAUSEA MEDICATION  *UNUSUAL SHORTNESS OF BREATH  *UNUSUAL BRUISING OR BLEEDING  TENDERNESS IN MOUTH AND THROAT WITH OR WITHOUT PRESENCE OF ULCERS  *URINARY PROBLEMS  *BOWEL PROBLEMS  UNUSUAL RASH Items with * indicate a potential emergency and should be followed up as soon as possible.  Feel free to call the clinic you have any questions or concerns. The clinic phone number is [phone].  

## 2017-10-19 NOTE — Progress Notes (Signed)
[facility] Follow up:    Kara Caraway, MD [place] [address]   DIAGNOSIS: Cancer Staging Malignant neoplasm of upper-outer quadrant of right breast in female, estrogen receptor negative ([place]) Staging form: Breast, AJCC 8th Edition - Clinical stage from 07/26/2017: Stage IIB (cT1c, cN1, cM0, G3, ER: Negative, PR: Negative, HER2: Negative) - Unsigned Staging comments: Staged at breast conference on 9.26.18   SUMMARY OF ONCOLOGIC HISTORY:   Malignant neoplasm of upper-outer quadrant of right breast in female, estrogen receptor negative ([place])   07/17/2017 Initial Diagnosis    Right upper outer quadrant painful palpable right breast mass by ultrasound measured 1 cm with 3 abnormal axillary lymph nodes 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  The Common Hereditary Cancers Panel was ordered.  The Hereditary Gene Panel offered by [facility] includes sequencing and/or deletion duplication testing of the following 46 genes: APC, ATM, AXIN2, BARD1, BMPR1A, BRCA1, BRCA2, BRIP1, CDH1, CDKN2A (p14ARF), CDKN2A (p16INK4a), CHEK2, CTNNA1, DICER1, EPCAM (Deletion/duplication testing only), GREM1 (promoter region deletion/duplication testing only), KIT, MEN1, MLH1, MSH2, MSH3, MSH6, MUTYH, NBN, NF1, NHTL1, PALB2, PDGFRA, PMS2, POLD1, POLE, PTEN, RAD50, RAD51C, RAD51D, SDHB, SDHC, SDHD, SMAD4, SMARCA4. STK11, TP53, TSC1, TSC2, and VHL.  The following genes were evaluated for sequence changes only: SDHA and HOXB13 c.251G>A variant only.       Results: POSITIVE for a mutation in BRCA2  [id number] (p.Asp1540Glyfs*6).  The date of this test report is 08/18/2017.       CURRENT THERAPY: Taxol/Carbo  INTERVAL HISTORY: Kara Crawford 51 y.o. female returns for evaluation prior to receiving her fourth cycle of Taxol/Carbo.   She has mild intermittent numbness in her feet but that only happens if she sits for long periods of time.  She is fatigued, worse in the evening.  She says her taste has returned, which is a good thing. She does have a "bump" in her right groin that popped up about 5 days ago.  She thinks it may be from shaving.  She says that it had pus in it, but most of it came out.  She denies any pain to the area, however it is erythematous.  She notes that she has been sweating more than normal and she thinks that may have contributed.  She also is having more gas and she wants to know if this is normal.  She denies nausea/vomting, or any further concerns today.     Patient Active Problem List   Diagnosis Date Noted  . Genetic testing 08/18/2017  . BRCA2 positive 08/18/2017  . Family history of breast cancer   . Family history of ovarian cancer   . Family history of prostate cancer   . Malignant neoplasm of upper-outer quadrant of right breast in female, estrogen receptor negative ([place]) 07/25/2017  . Perforated bowel ([place]) 05/13/2015  . Free intraperitoneal air 05/12/2015  . Submucosal lesion of esophagus 10/06/2014  . 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 Diabetes ([place]) 09/24/2014  . Morbid obesity ([place]) 03/20/2014  has No Known Allergies.  MEDICAL HISTORY: Past Medical History:  Diagnosis Date  . Anemia    anemia prior to uterine emboliztion procedure.  . Breast cancer (HCC)   . Diabetes mellitus without complication (HCC)    type 2  . Family history of breast cancer   . Family history of ovarian cancer   . Family history of prostate cancer   . Fibroids   . Headache   . Heart murmur   .  Hyperlipidemia   . Hypertension     SURGICAL HISTORY: Past Surgical History:  Procedure Laterality Date  . BREATH TEK H PYLORI N/A 04/17/2014   Procedure: BREATH TEK H PYLORI;  Surgeon: David H Newman, MD;  Location: [facility];  Service: General;  Laterality: N/A;  . CESAREAN SECTION     x3- normal development  . ESOPHAGOGASTRODUODENOSCOPY N/A 08/20/2015   Procedure: ESOPHAGOGASTRODUODENOSCOPY (EGD);  Surgeon: David Newman, MD;  Location: [facility];  Service: General;  Laterality: N/A;  . EXPLORATORY LAPAROTOMY  05/13/2015  . GASTRIC ROUX-EN-Y N/A 10/06/2014   Procedure: LAPAROSCOPIC ROUX-EN-Y GASTRIC BYPASS WITH UPPER ENDOSCOPY;  Surgeon: Matthew B Martin, MD;  Location: [facility];  Service: General;  Laterality: N/A;  . LAPAROSCOPY N/A 05/12/2015   Procedure: LAPAROSCOPY REPAIR OF PERFORATED BOWEL;  Surgeon: David Newman, MD;  Location: [facility];  Service: General;  Laterality: N/A;  . PORTACATH PLACEMENT N/A 08/02/2017   Procedure: INSERTION PORT-A-CATH;  Surgeon: Newman, David, MD;  Location: [facility];  Service: General;  Laterality: N/A;  . UTERINE ARTERY EMBOLIZATION  08-16-2010    SOCIAL HISTORY: Social History   Socioeconomic History  . Marital status: Married    Spouse name: Not on file  . Number of children: Not on file  . Years of education: Not on file  . Highest education level: Not on file  Social Needs  . Financial resource strain: Not on file  . Food insecurity - worry: Not on file  . Food insecurity - inability: Not on file  . Transportation needs - medical: Not on file  . Transportation needs - non-medical: Not on file  Occupational History  . Not on file  Tobacco Use  . Smoking status: Never Smoker  . Smokeless tobacco: Never Used  Substance and Sexual Activity  . Alcohol use: Yes    Alcohol/week: 0.6 oz    Types: 1 Glasses of wine per week    Comment: social -rare occ.  . Drug use: No  . Sexual activity: Yes  Other Topics Concern  .  Not on file  Social History Narrative  . Not on file    FAMILY HISTORY: Family History  Problem Relation Age of Onset  . Diabetes Mother   . Hypertension Mother   . Stroke Mother   . Diabetes Father   . Hypertension Father   . Prostate cancer Father 74       found at stage II  . Prostate cancer Paternal Uncle 71  . Breast cancer Paternal Aunt 48  . Cancer Maternal Aunt 55       type unknown  . Ovarian cancer Paternal Grandmother 45  . Cancer Other        type unk- dx in 40's  . Breast cancer Cousin 65    Review of Systems  Constitutional: Negative for appetite change, chills, fatigue, fever and unexpected weight change.  HENT:   Negative for hearing loss, lump/mass and trouble swallowing.   Eyes: Negative for eye problems and icterus.    Respiratory: Negative for chest tightness, cough and shortness of breath.   Cardiovascular: Negative for chest pain and leg swelling.  Gastrointestinal: Negative for abdominal distention, abdominal pain, blood in stool, constipation, diarrhea, nausea and vomiting.  Endocrine: Negative for hot flashes.  Musculoskeletal: Negative for arthralgias.  Skin: Negative for itching and rash.  Neurological: Negative for dizziness, extremity weakness, headaches and numbness.  Hematological: Negative for adenopathy. Does not bruise/bleed easily.  Psychiatric/Behavioral: Negative for depression and suicidal ideas. The patient is not nervous/anxious.       PHYSICAL EXAMINATION  ECOG PERFORMANCE STATUS: 1 - Symptomatic but completely ambulatory  Vitals:   10/19/17 0911  BP: 113/69  Pulse: 87  Resp: 18  Temp: 98.7 F (37.1 C)  SpO2: 100%    Physical Exam  Constitutional: She is oriented to person, place, and time and well-developed, well-nourished, and in no distress.  HENT:  Head: Normocephalic and atraumatic.  Mouth/Throat: Oropharynx is clear and moist. No oropharyngeal exudate.  Eyes: Pupils are equal, round, and reactive to light. No  scleral icterus.  Neck: Neck supple.  Cardiovascular: Normal rate, regular rhythm and normal heart sounds.  Pulmonary/Chest: Effort normal and breath sounds normal. No respiratory distress. She has no wheezes. She has no rales. She exhibits no tenderness.  Abdominal: Soft. Bowel sounds are normal. She exhibits no distension and no mass. There is no tenderness. There is no rebound and no guarding.  Musculoskeletal: She exhibits no edema.  Lymphadenopathy:    She has no cervical adenopathy.  Neurological: She is alert and oriented to person, place, and time.  Skin: Skin is warm and dry. No rash noted. No erythema.  Small lesion in groin, appears to have been previous area of follicular pustule that is healing well  Psychiatric: Affect normal.    LABORATORY DATA:  CBC    Component Value Date/Time   WBC 3.7 (L) 10/19/2017 0851   WBC 6.2 05/15/2015 0407   RBC 3.17 (L) 10/19/2017 0851   RBC 3.64 (L) 05/15/2015 0407   HGB 10.5 (L) 10/19/2017 0851   HCT 31.4 (L) 10/19/2017 0851   PLT 246 10/19/2017 0851   MCV 99.1 10/19/2017 0851   MCH 33.1 10/19/2017 0851   MCH 31.3 05/15/2015 0407   MCHC 33.4 10/19/2017 0851   MCHC 33.2 05/15/2015 0407   RDW 16.1 (H) 10/19/2017 0851   LYMPHSABS 1.0 10/19/2017 0851   MONOABS 0.3 10/19/2017 0851   EOSABS 0.1 10/19/2017 0851   BASOSABS 0.0 10/19/2017 0851    CMP     Component Value Date/Time   NA 141 10/19/2017 0851   K 3.6 10/19/2017 0851   CL 102 05/15/2015 0407   CO2 26 10/19/2017 0851   GLUCOSE 139 10/19/2017 0851   BUN 17.5 10/19/2017 0851   CREATININE 1.0 10/19/2017 0851   CALCIUM 9.7 10/19/2017 0851   PROT 7.3 10/19/2017 0851   ALBUMIN 4.1 10/19/2017 0851   AST 19 10/19/2017 0851   ALT 22 10/19/2017 0851   ALKPHOS 53 10/19/2017 0851   BILITOT 0.43 10/19/2017 0851   GFRNONAA >60 05/15/2015 0407   GFRAA >60 05/15/2015 0407      ASSESSMENT and PLAN:   Malignant neoplasm of upper-outer quadrant of right breast in female,  estrogen receptor negative ([place]) 07/17/2017: Right upper outer quadrant painful palpable right breast mass by ultrasound measured 1 cm with 3 abnormal axillary lymph nodes 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 Neoadjuvant chemotherapy with Adriamycin and Cytoxan dose dense 4 followed by Taxol weekly 12 2. Followed bybilateral mastectomies with reconstructionwith sentinel lymph node study + targeted axillary dissection 3. Followed by adjuvant radiation therapy Genetic testing: BRCA2 mutation positive --------------------------------------------------------------------------- Current treatment: Completed 4 cycles ofneoadjuvant dose dense Adriamycin and Cytoxan, today is cycle 4 Taxol carboplatin  Chemotherapy toxicities: Kimball is tolerating chemotherapy well.  She has very intermittent neuropathy in her toes, and for now we will monitor.  She knows to let us know if it worsens as she may need a dose reduction or delay.  Her labs are stable and I reviewed those with her in detail.  I recommended she take OTC gas x for her bloating and gas issues.  She will return weekly for Taxol and Carbo and will see myself or Dr. Gudena with every other cycle.      All questions were answered. The patient knows to call the clinic with any problems, questions or concerns. We can certainly see the patient much sooner if necessary.  A total of (30) minutes of face-to-face time was spent with this patient with greater than 50% of that time in counseling and care-coordination.  This note was electronically signed. Lindsey C Causey, NP 10/19/2017 

## 2017-10-19 NOTE — Assessment & Plan Note (Addendum)
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 Neoadjuvant chemotherapy with Adriamycin and Cytoxan dose dense 4 followed by Taxol weekly 12 2. Followed bybilateral mastectomies with reconstructionwith sentinel lymph node study + targeted axillary dissection 3. Followed by adjuvant radiation therapy Genetic testing: BRCA2 mutation positive --------------------------------------------------------------------------- Current treatment: Completed 4 cycles ofneoadjuvant dose dense Adriamycin and Cytoxan, today is cycle 4 Taxol carboplatin  Chemotherapy toxicities: Kara Crawford is tolerating chemotherapy well.  She has very intermittent neuropathy in her toes, and for now we will monitor.  She knows to let us know if it worsens as she may need a dose reduction or delay.  Her labs are stable and I reviewed those with her in detail.  I recommended she take OTC gas x for her bloating and gas issues.  She will return weekly for Taxol and Carbo and will see myself or Dr. Lindi Adie with every other cycle.

## 2017-10-20 ENCOUNTER — Telehealth: Payer: Self-pay | Admitting: Adult Health

## 2017-10-20 NOTE — Telephone Encounter (Signed)
No 12/20 los °

## 2017-10-25 ENCOUNTER — Ambulatory Visit (HOSPITAL_COMMUNITY)
Admission: RE | Admit: 2017-10-25 | Discharge: 2017-10-25 | Disposition: A | Discharge status: Home / Self Care | Payer: 59 | Source: Ambulatory Visit | Attending: Internal Medicine | Admitting: Internal Medicine

## 2017-10-25 ENCOUNTER — Other Ambulatory Visit (HOSPITAL_COMMUNITY): Payer: Self-pay

## 2017-10-25 ENCOUNTER — Encounter (HOSPITAL_COMMUNITY): Payer: Self-pay | Admitting: Internal Medicine

## 2017-10-25 ENCOUNTER — Ambulatory Visit (HOSPITAL_BASED_OUTPATIENT_CLINIC_OR_DEPARTMENT_OTHER)
Admission: RE | Admit: 2017-10-25 | Discharge: 2017-10-25 | Disposition: A | Discharge status: Home / Self Care | Payer: 59 | Source: Ambulatory Visit | Attending: Internal Medicine | Admitting: Internal Medicine

## 2017-10-25 VITALS — BP 120/80 | HR 72 | Wt 181.0 lb

## 2017-10-25 DIAGNOSIS — Z8249 Family history of ischemic heart disease and other diseases of the circulatory system: Secondary | ICD-10-CM | POA: Diagnosis not present

## 2017-10-25 DIAGNOSIS — C50411 Malignant neoplasm of upper-outer quadrant of right female breast: Secondary | ICD-10-CM | POA: Diagnosis not present

## 2017-10-25 DIAGNOSIS — Z79899 Other long term (current) drug therapy: Secondary | ICD-10-CM | POA: Diagnosis not present

## 2017-10-25 DIAGNOSIS — Z803 Family history of malignant neoplasm of breast: Secondary | ICD-10-CM | POA: Insufficient documentation

## 2017-10-25 DIAGNOSIS — Z8041 Family history of malignant neoplasm of ovary: Secondary | ICD-10-CM | POA: Insufficient documentation

## 2017-10-25 DIAGNOSIS — E785 Hyperlipidemia, unspecified: Secondary | ICD-10-CM | POA: Insufficient documentation

## 2017-10-25 DIAGNOSIS — Z833 Family history of diabetes mellitus: Secondary | ICD-10-CM | POA: Diagnosis not present

## 2017-10-25 DIAGNOSIS — E119 Type 2 diabetes mellitus without complications: Secondary | ICD-10-CM | POA: Insufficient documentation

## 2017-10-25 DIAGNOSIS — Z7984 Long term (current) use of oral hypoglycemic drugs: Secondary | ICD-10-CM | POA: Diagnosis not present

## 2017-10-25 DIAGNOSIS — Z171 Estrogen receptor negative status [ER-]: Secondary | ICD-10-CM | POA: Diagnosis not present

## 2017-10-25 DIAGNOSIS — Z823 Family history of stroke: Secondary | ICD-10-CM | POA: Insufficient documentation

## 2017-10-25 DIAGNOSIS — I10 Essential (primary) hypertension: Secondary | ICD-10-CM | POA: Insufficient documentation

## 2017-10-25 DIAGNOSIS — Z1501 Genetic susceptibility to malignant neoplasm of breast: Secondary | ICD-10-CM | POA: Diagnosis not present

## 2017-10-25 DIAGNOSIS — Z8042 Family history of malignant neoplasm of prostate: Secondary | ICD-10-CM | POA: Insufficient documentation

## 2017-10-25 NOTE — Patient Instructions (Signed)
We will contact you in 6 months to schedule your next appointment and echocardiogram  

## 2017-10-25 NOTE — Progress Notes (Signed)
  Echocardiogram 2D Echocardiogram has been performed.  Kara Crawford 10/25/2017, 12:07 PM

## 2017-10-25 NOTE — Progress Notes (Signed)
CARDIO-ONCOLOGY CLINIC CONSULT NOTE  Referring Physician: Lindi Adie    HPI:  Kara Crawford is 51 y.o. female with h/o HTN, DM2  breast cancer referred by Dr. Lindi Adie for enrollment into the Cardio-Oncology program.  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 BRCA2+        08/03/2017 -  Neo-Adjuvant Chemotherapy    Dose dense Adriamycin and Cytoxan followed by Taxol weekly 12      08/18/2017 Genetic Testing    Patient had genetic testing due to a personal history of breast cancer and a family history of prostate, breast, and ovarian cancer. The Common Hereditary Cancers Panel was ordered. The Hereditary Gene Panel offered by [facility] includes sequencing and/or deletion duplication testing of the following 46 genes: APC, ATM, AXIN2, BARD1, BMPR1A, BRCA1, BRCA2, BRIP1, CDH1, CDKN2A (p14ARF), CDKN2A (p16INK4a), CHEK2, CTNNA1, DICER1, EPCAM (Deletion/duplication testing only), GREM1 (promoter region deletion/duplication testing only), KIT, MEN1, MLH1, MSH2, MSH3, MSH6, MUTYH, NBN, NF1, NHTL1, PALB2, PDGFRA, PMS2, POLD1, POLE, PTEN, RAD50, RAD51C, RAD51D, SDHB, SDHC, SDHD, SMAD4, SMARCA4. STK11, TP53, TSC1, TSC2, and VHL. The following genes were evaluated for sequence changes only: SDHA and HOXB13 c.251G>A variant only.   Results: POSITIVEfor a mutation in [id number] (p.Asp1540Glyfs*6). The date of this test report is 08/18/2017.       Denies any h/o heart disease except for a murmur. Has completed 4 rounds of adriamycin and cytoxan 11/18. Now getting 12 weeks of  Taxol + carboplatinin. Tolerating well. Pending bilateral mastectomies. Denies CP, edema, DOE or PND.    Echo today EF 60-65% Grade I DD  LS'1 11.5cm/s GLS -13.4 (underestimated) Personally reviewed   Review of Systems: [y] = yes, '[ ]'$  = no   General: Weight gain '[ ]'$ ; Weight loss '[ ]'$ ; Anorexia '[ ]'$ ; Fatigue '[ ]'$ ; Fever '[ ]'$ ; Chills '[ ]'$ ; Weakness '[ ]'$   Cardiac: Chest pain/pressure '[ ]'$ ; Resting SOB '[ ]'$ ; Exertional SOB '[ ]'$ ; Orthopnea '[ ]'$ ; Pedal Edema '[ ]'$ ; Palpitations '[ ]'$ ; Syncope '[ ]'$ ; Presyncope '[ ]'$ ; Paroxysmal nocturnal dyspnea'[ ]'$   Pulmonary: Cough '[ ]'$ ; Wheezing'[ ]'$ ; Hemoptysis'[ ]'$ ; Sputum '[ ]'$ ; Snoring '[ ]'$   GI: Vomiting'[ ]'$ ; Dysphagia'[ ]'$ ; Melena'[ ]'$ ; Hematochezia '[ ]'$ ; Heartburn'[ ]'$ ; Abdominal pain '[ ]'$ ; Constipation '[ ]'$ ; Diarrhea '[ ]'$ ; BRBPR '[ ]'$   GU: Hematuria'[ ]'$ ; Dysuria '[ ]'$ ; Nocturia'[ ]'$   Vascular: Pain in legs with walking '[ ]'$ ; Pain in feet with lying flat '[ ]'$ ; Non-healing sores '[ ]'$ ; Stroke '[ ]'$ ; TIA '[ ]'$ ; Slurred speech '[ ]'$ ;  Neuro: Headaches'[ ]'$ ; Vertigo'[ ]'$ ; Seizures'[ ]'$ ; Paresthesias'[ ]'$ ;Blurred vision '[ ]'$ ; Diplopia '[ ]'$ ; Vision changes '[ ]'$   Ortho/Skin: Arthritis Blue.Reese ]; Joint pain [ y]; Muscle pain '[ ]'$ ; Joint swelling '[ ]'$ ; Back Pain '[ ]'$ ; Rash '[ ]'$   Psych: Depression'[ ]'$ ; Anxiety'[ ]'$   Heme: Bleeding problems '[ ]'$ ; Clotting disorders '[ ]'$ ; Anemia Blue.Reese ]  Endocrine: Diabetes Blue.Reese ]; Thyroid dysfunction'[ ]'$    Past Medical History:  Diagnosis Date  . Anemia    anemia prior to uterine emboliztion procedure.  . Breast cancer ([place])   . Diabetes  mellitus without complication ([place])    type 2  . Family history of breast cancer   . Family history of ovarian cancer   . Family history of prostate cancer   . Fibroids   . Headache   . Heart murmur   . Hyperlipidemia   . Hypertension     Current Outpatient Medications  Medication Sig Dispense Refill  . acetaminophen (TYLENOL) 325 MG tablet Take 650 mg by mouth as needed.    Marland Kitchen amLODipine (NORVASC) 10 MG tablet Take 10 mg by mouth every morning.     Marland Kitchen atorvastatin  (LIPITOR) 80 MG tablet Take 80 mg by mouth every morning.     . Calcium Carbonate-Vitamin D (CALCIUM 500/D PO) Take 2 tablets by mouth daily.    . Cholecalciferol (VITAMIN D3 PO) Take by mouth.    . Cyanocobalamin (VITAMIN B-12 PO) Take 1 tablet by mouth every morning.    . lidocaine-prilocaine (EMLA) cream Apply to affected area once 30 g 3  . lisinopril-hydrochlorothiazide (PRINZIDE,ZESTORETIC) 20-12.5 MG per tablet Take 2 tablets by mouth daily.     Marland Kitchen LORazepam (ATIVAN) 0.5 MG tablet Take 1 tablet (0.5 mg total) by mouth every 6 (six) hours as needed (Nausea or vomiting). 30 tablet 0  . metoprolol succinate (TOPROL-XL) 25 MG 24 hr tablet Take 25 mg by mouth every morning.     . ondansetron (ZOFRAN) 8 MG tablet Take 1 tablet (8 mg total) by mouth 2 (two) times daily as needed. Start on the third day after chemotherapy. 30 tablet 1  . pantoprazole (PROTONIX) 40 MG tablet Take 40 mg by mouth daily.    . prochlorperazine (COMPAZINE) 10 MG tablet TAKE 1 TABLET(10 MG) BY MOUTH EVERY 6 HOURS AS NEEDED FOR NAUSEA OR VOMITING 385 tablet 1  . sitaGLIPtin-metformin (JANUMET) 50-500 MG per tablet Take 1 tablet by mouth 2 (two) times daily with a meal.      No current facility-administered medications for this encounter.    Facility-Administered Medications Ordered in Other Encounters  Medication Dose Route Frequency Provider Last Rate Last Dose  . sodium chloride flush (NS) 0.9 % injection 10 mL  10 mL Intravenous PRN Nicholas Lose, MD   10 mL at 08/03/17 1516  . sodium chloride flush (NS) 0.9 % injection 10 mL  10 mL Intravenous PRN Nicholas Lose, MD   10 mL at 08/17/17 1531    No Known Allergies    Social History   Socioeconomic History  . Marital status: Married    Spouse name: Not on file  . Number of children: Not on file  . Years of education: Not on file  . Highest education level: Not on file  Social Needs  . Financial resource strain: Not on file  . Food insecurity - worry: Not on  file  . Food insecurity - inability: Not on file  . Transportation needs - medical: Not on file  . Transportation needs - non-medical: Not on file  Occupational History  . Not on file  Tobacco Use  . Smoking status: Never Smoker  . Smokeless tobacco: Never Used  Substance and Sexual Activity  . Alcohol use: Yes    Alcohol/week: 0.6 oz    Types: 1 Glasses of wine per week    Comment: social -rare occ.  . Drug use: No  . Sexual activity: Yes  Other Topics Concern  . Not on file  Social History Narrative  . Not on file      Family History  Problem Relation Age of Onset  . Diabetes Mother   . Hypertension Mother   . Stroke Mother   . Diabetes Father   . Hypertension Father   . Prostate cancer Father 50       found at stage II  . Prostate cancer Paternal Uncle 78  . Breast cancer Paternal Aunt 92  . Cancer Maternal Aunt 55       type unknown  . Ovarian cancer Paternal Grandmother 29  . Cancer Other        type unk- dx in 40's  . Breast cancer Cousin 65    Vitals:   10/25/17 1214  BP: 120/80  Pulse: 72  SpO2: 99%  Weight: 181 lb (82.1 kg)    PHYSICAL EXAM: General:  Well appearing. No respiratory difficulty HEENT: normal Neck: supple. no JVD. Carotids 2+ bilat; no bruits. No lymphadenopathy or thryomegaly appreciated. Cor: PMI nondisplaced. Regular rate & rhythm. No rubs, gallops or murmurs. R port-a-cath Lungs: clear Abdomen: soft, nontender, nondistended. No hepatosplenomegaly. No bruits or masses. Good bowel sounds. Extremities: no cyanosis, clubbing, rash, edema Neuro: alert & oriented x 3, cranial nerves grossly intact. moves all 4 extremities w/o difficulty. Affect pleasant.   ASSESSMENT & PLAN: 1. Right Breast Cancer, dx'd 9/19 - T1bN1 stage IIB. Triple negative. BRCA2 + - Has completed 4 rounds of adriamycin and cytoxan 11/18. Now getting 12 weeks of Taxol + carboplatinin.  - I reviewed echos personally. EF and Doppler parameters stable. No HF on  exam. Will see back in 6 months for repeat echo to ensure stability. I explained incidence of Adriamycin cardiotoxicity in detail include small possibility of very delayed toxicity.      Glori Bickers, MD  12:33 PM

## 2017-10-26 ENCOUNTER — Ambulatory Visit (HOSPITAL_BASED_OUTPATIENT_CLINIC_OR_DEPARTMENT_OTHER): Payer: 59

## 2017-10-26 ENCOUNTER — Other Ambulatory Visit (HOSPITAL_BASED_OUTPATIENT_CLINIC_OR_DEPARTMENT_OTHER): Payer: 59

## 2017-10-26 ENCOUNTER — Ambulatory Visit: Payer: 59

## 2017-10-26 VITALS — BP 112/78 | HR 88 | Temp 98.2°F | Resp 18

## 2017-10-26 DIAGNOSIS — Z171 Estrogen receptor negative status [ER-]: Principal | ICD-10-CM

## 2017-10-26 DIAGNOSIS — C50411 Malignant neoplasm of upper-outer quadrant of right female breast: Secondary | ICD-10-CM

## 2017-10-26 DIAGNOSIS — Z5111 Encounter for antineoplastic chemotherapy: Secondary | ICD-10-CM | POA: Diagnosis not present

## 2017-10-26 DIAGNOSIS — C7951 Secondary malignant neoplasm of bone: Secondary | ICD-10-CM | POA: Diagnosis not present

## 2017-10-26 LAB — CBC WITH DIFFERENTIAL/PLATELET
BASO%: 1.1 % (ref 0.0–2.0)
BASOS ABS: 0 10*3/uL (ref 0.0–0.1)
EOS ABS: 0.1 10*3/uL (ref 0.0–0.5)
EOS%: 1.8 % (ref 0.0–7.0)
HEMATOCRIT: 32.1 % — AB (ref 34.8–46.6)
HEMOGLOBIN: 10.7 g/dL — AB (ref 11.6–15.9)
LYMPH#: 0.9 10*3/uL (ref 0.9–3.3)
LYMPH%: 32.9 % (ref 14.0–49.7)
MCH: 32.8 pg (ref 25.1–34.0)
MCHC: 33.3 g/dL (ref 31.5–36.0)
MCV: 98.5 fL (ref 79.5–101.0)
MONO#: 0.2 10*3/uL (ref 0.1–0.9)
MONO%: 7.1 % (ref 0.0–14.0)
NEUT#: 1.6 10*3/uL (ref 1.5–6.5)
NEUT%: 57.1 % (ref 38.4–76.8)
Platelets: 228 10*3/uL (ref 145–400)
RBC: 3.26 10*6/uL — ABNORMAL LOW (ref 3.70–5.45)
RDW: 17 % — ABNORMAL HIGH (ref 11.2–14.5)
WBC: 2.9 10*3/uL — ABNORMAL LOW (ref 3.9–10.3)

## 2017-10-26 LAB — COMPREHENSIVE METABOLIC PANEL
ALT: 20 U/L (ref 0–55)
AST: 18 U/L (ref 5–34)
Albumin: 4.2 g/dL (ref 3.5–5.0)
Alkaline Phosphatase: 53 U/L (ref 40–150)
Anion Gap: 11 mEq/L (ref 3–11)
BILIRUBIN TOTAL: 0.39 mg/dL (ref 0.20–1.20)
BUN: 15.6 mg/dL (ref 7.0–26.0)
CHLORIDE: 104 meq/L (ref 98–109)
CO2: 25 meq/L (ref 22–29)
CREATININE: 0.9 mg/dL (ref 0.6–1.1)
Calcium: 9.8 mg/dL (ref 8.4–10.4)
EGFR: >60 mL/min/{1.73_m2} (ref >60)
Glucose: 104 mg/dl (ref 70–140)
Potassium: 3.7 mEq/L (ref 3.5–5.1)
SODIUM: 141 meq/L (ref 136–145)
TOTAL PROTEIN: 7.4 g/dL (ref 6.4–8.3)

## 2017-10-26 MED ORDER — SODIUM CHLORIDE 0.9% FLUSH
10.0000 mL | INTRAVENOUS | Status: AC | PRN
  Administered 2017-10-26: 10 mL via INTRAVENOUS
  Filled 2017-10-26: qty 10

## 2017-10-26 MED ORDER — HEPARIN SOD (PORK) LOCK FLUSH 100 UNIT/ML IV SOLN
500.0000 [IU] | Freq: Once | INTRAVENOUS | Status: AC | PRN
  Administered 2017-10-26: 500 [IU]
  Filled 2017-10-26: qty 5

## 2017-10-26 MED ORDER — DIPHENHYDRAMINE HCL 50 MG/ML IJ SOLN
50.0000 mg | Freq: Once | INTRAMUSCULAR | Status: AC
  Administered 2017-10-26: 50 mg via INTRAVENOUS

## 2017-10-26 MED ORDER — SODIUM CHLORIDE 0.9 % IV SOLN
Freq: Once | INTRAVENOUS | Status: AC
  Administered 2017-10-26: 12:00:00 via INTRAVENOUS

## 2017-10-26 MED ORDER — DIPHENHYDRAMINE HCL 50 MG/ML IJ SOLN
INTRAMUSCULAR | Status: AC
  Filled 2017-10-26: qty 1

## 2017-10-26 MED ORDER — SODIUM CHLORIDE 0.9 % IV SOLN
20.0000 mg | Freq: Once | INTRAVENOUS | Status: AC
  Administered 2017-10-26: 20 mg via INTRAVENOUS
  Filled 2017-10-26: qty 2

## 2017-10-26 MED ORDER — FAMOTIDINE IN NACL 20-0.9 MG/50ML-% IV SOLN
INTRAVENOUS | Status: AC
  Filled 2017-10-26: qty 50

## 2017-10-26 MED ORDER — PACLITAXEL CHEMO INJECTION 300 MG/50ML
50.0000 mg/m2 | Freq: Once | INTRAVENOUS | Status: AC
  Administered 2017-10-26: 96 mg via INTRAVENOUS
  Filled 2017-10-26: qty 16

## 2017-10-26 MED ORDER — SODIUM CHLORIDE 0.9% FLUSH
10.0000 mL | INTRAVENOUS | Status: DC | PRN
  Administered 2017-10-26: 10 mL
  Filled 2017-10-26: qty 10

## 2017-10-26 MED ORDER — SODIUM CHLORIDE 0.9 % IV SOLN
239.2000 mg | Freq: Once | INTRAVENOUS | Status: AC
  Administered 2017-10-26: 240 mg via INTRAVENOUS
  Filled 2017-10-26: qty 24

## 2017-10-26 MED ORDER — FAMOTIDINE IN NACL 20-0.9 MG/50ML-% IV SOLN
20.0000 mg | Freq: Once | INTRAVENOUS | Status: AC
  Administered 2017-10-26: 20 mg via INTRAVENOUS

## 2017-10-26 NOTE — Patient Instructions (Signed)
Implanted Port Home Guide An implanted port is a type of central line that is placed under the skin. Central lines are used to provide IV access when treatment or nutrition needs to be given through a person's veins. Implanted ports are used for long-term IV access. An implanted port may be placed because:  You need IV medicine that would be irritating to the small veins in your hands or arms.  You need long-term IV medicines, such as antibiotics.  You need IV nutrition for a long period.  You need frequent blood draws for lab tests.  You need dialysis.  Implanted ports are usually placed in the chest area, but they can also be placed in the upper arm, the abdomen, or the leg. An implanted port has two main parts:  Reservoir. The reservoir is round and will appear as a small, raised area under your skin. The reservoir is the part where a needle is inserted to give medicines or draw blood.  Catheter. The catheter is a thin, flexible tube that extends from the reservoir. The catheter is placed into a large vein. Medicine that is inserted into the reservoir goes into the catheter and then into the vein.  How will I care for my incision site? Do not get the incision site wet. Bathe or shower as directed by your health care provider. How is my port accessed? Special steps must be taken to access the port:  Before the port is accessed, a numbing cream can be placed on the skin. This helps numb the skin over the port site.  Your health care provider uses a sterile technique to access the port. ? Your health care provider must put on a mask and sterile gloves. ? The skin over your port is cleaned carefully with an antiseptic and allowed to dry. ? The port is gently pinched between sterile gloves, and a needle is inserted into the port.  Only "non-coring" port needles should be used to access the port. Once the port is accessed, a blood return should be checked. This helps ensure that the port  is in the vein and is not clogged.  If your port needs to remain accessed for a constant infusion, a clear (transparent) bandage will be placed over the needle site. The bandage and needle will need to be changed every week, or as directed by your health care provider.  Keep the bandage covering the needle clean and dry. Do not get it wet. Follow your health care provider's instructions on how to take a shower or bath while the port is accessed.  If your port does not need to stay accessed, no bandage is needed over the port.  What is flushing? Flushing helps keep the port from getting clogged. Follow your health care provider's instructions on how and when to flush the port. Ports are usually flushed with saline solution or a medicine called heparin. The need for flushing will depend on how the port is used.  If the port is used for intermittent medicines or blood draws, the port will need to be flushed: ? After medicines have been given. ? After blood has been drawn. ? As part of routine maintenance.  If a constant infusion is running, the port may not need to be flushed.  How long will my port stay implanted? The port can stay in for as long as your health care provider thinks it is needed. When it is time for the port to come out, surgery will be   done to remove it. The procedure is similar to the one performed when the port was put in. When should I seek immediate medical care? When you have an implanted port, you should seek immediate medical care if:  You notice a bad smell coming from the incision site.  You have swelling, redness, or drainage at the incision site.  You have more swelling or pain at the port site or the surrounding area.  You have a fever that is not controlled with medicine.  This information is not intended to replace advice given to you by your health care provider. Make sure you discuss any questions you have with your health care provider. Document  Released: 10/17/2005 Document Revised: 03/24/2016 Document Reviewed: 06/24/2013 Elsevier Interactive Patient Education  2017 Elsevier Inc.  

## 2017-10-26 NOTE — Patient Instructions (Signed)
[facility] Discharge Instructions for Patients Receiving Chemotherapy  Today you received the following chemotherapy agents paclitaxel (Taxol) and carboplatin (Paraplatin)   To help prevent nausea and vomiting after your treatment, we encourage you to take your nausea medication as directed.  If you develop nausea and vomiting that is not controlled by your nausea medication, call the clinic.   BELOW ARE SYMPTOMS THAT SHOULD BE REPORTED IMMEDIATELY:  *FEVER GREATER THAN 100.5 F  *CHILLS WITH OR WITHOUT FEVER  NAUSEA AND VOMITING THAT IS NOT CONTROLLED WITH YOUR NAUSEA MEDICATION  *UNUSUAL SHORTNESS OF BREATH  *UNUSUAL BRUISING OR BLEEDING  TENDERNESS IN MOUTH AND THROAT WITH OR WITHOUT PRESENCE OF ULCERS  *URINARY PROBLEMS  *BOWEL PROBLEMS  UNUSUAL RASH Items with * indicate a potential emergency and should be followed up as soon as possible.  Feel free to call the clinic you have any questions or concerns. The clinic phone number is [phone].

## 2017-11-01 NOTE — Assessment & Plan Note (Signed)
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 Neoadjuvant chemotherapy with Adriamycin and Cytoxan dose dense 4 followed by Taxol weekly 12 2. Followed bybilateral mastectomies with reconstructionwith sentinel lymph node study + targeted axillary dissection 3. Followed by adjuvant radiation therapy Genetic testing: BRCA2 mutation positive --------------------------------------------------------------------------- Current treatment: Completed 4 cycles ofneoadjuvant dose dense Adriamycin and Cytoxan, today is cycle 6 Taxol carboplatin  Chemotherapy toxicities:  intermittent neuropathy in her toes.  Return weekly for chemo and in 2 weeks for follow up with me

## 2017-11-02 ENCOUNTER — Ambulatory Visit: Payer: 59

## 2017-11-02 ENCOUNTER — Other Ambulatory Visit (HOSPITAL_BASED_OUTPATIENT_CLINIC_OR_DEPARTMENT_OTHER): Payer: 59

## 2017-11-02 ENCOUNTER — Ambulatory Visit (HOSPITAL_BASED_OUTPATIENT_CLINIC_OR_DEPARTMENT_OTHER): Payer: 59

## 2017-11-02 ENCOUNTER — Ambulatory Visit (HOSPITAL_BASED_OUTPATIENT_CLINIC_OR_DEPARTMENT_OTHER): Payer: 59 | Admitting: Hematology and Oncology

## 2017-11-02 DIAGNOSIS — R530 Neoplastic (malignant) related fatigue: Secondary | ICD-10-CM | POA: Diagnosis not present

## 2017-11-02 DIAGNOSIS — C50411 Malignant neoplasm of upper-outer quadrant of right female breast: Secondary | ICD-10-CM

## 2017-11-02 DIAGNOSIS — Z171 Estrogen receptor negative status [ER-]: Secondary | ICD-10-CM

## 2017-11-02 DIAGNOSIS — G620 Drug-induced polyneuropathy: Secondary | ICD-10-CM

## 2017-11-02 DIAGNOSIS — Z5111 Encounter for antineoplastic chemotherapy: Secondary | ICD-10-CM | POA: Diagnosis not present

## 2017-11-02 DIAGNOSIS — C7951 Secondary malignant neoplasm of bone: Secondary | ICD-10-CM | POA: Diagnosis not present

## 2017-11-02 DIAGNOSIS — D630 Anemia in neoplastic disease: Secondary | ICD-10-CM

## 2017-11-02 DIAGNOSIS — C773 Secondary and unspecified malignant neoplasm of axilla and upper limb lymph nodes: Secondary | ICD-10-CM | POA: Diagnosis not present

## 2017-11-02 LAB — COMPREHENSIVE METABOLIC PANEL
ALBUMIN: 4.2 g/dL (ref 3.5–5.0)
ALK PHOS: 51 U/L (ref 40–150)
ALT: 22 U/L (ref 0–55)
AST: 17 U/L (ref 5–34)
Anion Gap: 9 mEq/L (ref 3–11)
BUN: 12.5 mg/dL (ref 7.0–26.0)
CALCIUM: 9.8 mg/dL (ref 8.4–10.4)
CHLORIDE: 106 meq/L (ref 98–109)
CO2: 27 mEq/L (ref 22–29)
Creatinine: 0.9 mg/dL (ref 0.6–1.1)
Glucose: 96 mg/dl (ref 70–140)
POTASSIUM: 3.7 meq/L (ref 3.5–5.1)
Sodium: 142 mEq/L (ref 136–145)
Total Bilirubin: 0.53 mg/dL (ref 0.20–1.20)
Total Protein: 7.3 g/dL (ref 6.4–8.3)

## 2017-11-02 LAB — CBC WITH DIFFERENTIAL/PLATELET
BASO%: 0.7 % (ref 0.0–2.0)
Basophils Absolute: 0 10*3/uL (ref 0.0–0.1)
EOS%: 1.5 % (ref 0.0–7.0)
Eosinophils Absolute: 0 10*3/uL (ref 0.0–0.5)
HEMATOCRIT: 31.4 % — AB (ref 34.8–46.6)
HEMOGLOBIN: 10.5 g/dL — AB (ref 11.6–15.9)
LYMPH#: 1.1 10*3/uL (ref 0.9–3.3)
LYMPH%: 40.6 % (ref 14.0–49.7)
MCH: 33.3 pg (ref 25.1–34.0)
MCHC: 33.4 g/dL (ref 31.5–36.0)
MCV: 99.7 fL (ref 79.5–101.0)
MONO#: 0.2 10*3/uL (ref 0.1–0.9)
MONO%: 6.3 % (ref 0.0–14.0)
NEUT%: 50.9 % (ref 38.4–76.8)
NEUTROS ABS: 1.4 10*3/uL — AB (ref 1.5–6.5)
PLATELETS: 181 10*3/uL (ref 145–400)
RBC: 3.15 10*6/uL — AB (ref 3.70–5.45)
RDW: 15.4 % — ABNORMAL HIGH (ref 11.2–14.5)
WBC: 2.7 10*3/uL — AB (ref 3.9–10.3)

## 2017-11-02 MED ORDER — DIPHENHYDRAMINE HCL 50 MG/ML IJ SOLN
INTRAMUSCULAR | Status: AC
  Filled 2017-11-02: qty 1

## 2017-11-02 MED ORDER — SODIUM CHLORIDE 0.9 % IV SOLN
45.0000 mg/m2 | Freq: Once | INTRAVENOUS | Status: AC
  Administered 2017-11-02: 84 mg via INTRAVENOUS
  Filled 2017-11-02: qty 14

## 2017-11-02 MED ORDER — SODIUM CHLORIDE 0.9 % IV SOLN
239.2000 mg | Freq: Once | INTRAVENOUS | Status: AC
  Administered 2017-11-02: 240 mg via INTRAVENOUS
  Filled 2017-11-02: qty 24

## 2017-11-02 MED ORDER — SODIUM CHLORIDE 0.9 % IV SOLN
Freq: Once | INTRAVENOUS | Status: AC
  Administered 2017-11-02: 11:00:00 via INTRAVENOUS

## 2017-11-02 MED ORDER — FAMOTIDINE IN NACL 20-0.9 MG/50ML-% IV SOLN
INTRAVENOUS | Status: AC
  Filled 2017-11-02: qty 50

## 2017-11-02 MED ORDER — FAMOTIDINE IN NACL 20-0.9 MG/50ML-% IV SOLN
20.0000 mg | Freq: Once | INTRAVENOUS | Status: AC
  Administered 2017-11-02: 20 mg via INTRAVENOUS

## 2017-11-02 MED ORDER — DIPHENHYDRAMINE HCL 50 MG/ML IJ SOLN
50.0000 mg | Freq: Once | INTRAMUSCULAR | Status: AC
  Administered 2017-11-02: 50 mg via INTRAVENOUS

## 2017-11-02 MED ORDER — SODIUM CHLORIDE 0.9% FLUSH
10.0000 mL | INTRAVENOUS | Status: DC | PRN
  Administered 2017-11-02: 10 mL via INTRAVENOUS
  Filled 2017-11-02: qty 10

## 2017-11-02 MED ORDER — SODIUM CHLORIDE 0.9% FLUSH
10.0000 mL | INTRAVENOUS | Status: DC | PRN
  Administered 2017-11-02: 10 mL
  Filled 2017-11-02: qty 10

## 2017-11-02 MED ORDER — SODIUM CHLORIDE 0.9 % IV SOLN
20.0000 mg | Freq: Once | INTRAVENOUS | Status: AC
  Administered 2017-11-02: 20 mg via INTRAVENOUS
  Filled 2017-11-02: qty 2

## 2017-11-02 MED ORDER — HEPARIN SOD (PORK) LOCK FLUSH 100 UNIT/ML IV SOLN
500.0000 [IU] | Freq: Once | INTRAVENOUS | Status: AC | PRN
  Administered 2017-11-02: 500 [IU]
  Filled 2017-11-02: qty 5

## 2017-11-02 NOTE — Patient Instructions (Signed)
[facility] Discharge Instructions for Patients Receiving Chemotherapy  Today you received the following chemotherapy agents paclitaxel (Taxol) and carboplatin (Paraplatin)   To help prevent nausea and vomiting after your treatment, we encourage you to take your nausea medication as directed.  If you develop nausea and vomiting that is not controlled by your nausea medication, call the clinic.   BELOW ARE SYMPTOMS THAT SHOULD BE REPORTED IMMEDIATELY:  *FEVER GREATER THAN 100.5 F  *CHILLS WITH OR WITHOUT FEVER  NAUSEA AND VOMITING THAT IS NOT CONTROLLED WITH YOUR NAUSEA MEDICATION  *UNUSUAL SHORTNESS OF BREATH  *UNUSUAL BRUISING OR BLEEDING  TENDERNESS IN MOUTH AND THROAT WITH OR WITHOUT PRESENCE OF ULCERS  *URINARY PROBLEMS  *BOWEL PROBLEMS  UNUSUAL RASH Items with * indicate a potential emergency and should be followed up as soon as possible.  Feel free to call the clinic you have any questions or concerns. The clinic phone number is [phone].

## 2017-11-02 NOTE — Progress Notes (Signed)
Patient Care Team: Cari Caraway, MD as PCP - General (Family Medicine) Christophe Louis, MD as Consulting Physician (Obstetrics and Gynecology) Alphonsa Overall, MD as Consulting Physician (General Surgery) Nicholas Lose, MD as Consulting Physician (Hematology and Oncology) Eppie Gibson, MD as Attending Physician (Radiation Oncology)  DIAGNOSIS:  Encounter Diagnosis  Name Primary?  . Malignant neoplasm of upper-outer quadrant of right breast in female, estrogen receptor negative (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  The Common Hereditary Cancers Panel was ordered.  The Hereditary Gene Panel offered by [facility] includes sequencing and/or deletion duplication testing of the following 46 genes: APC, ATM, AXIN2, BARD1, BMPR1A, BRCA1, BRCA2, BRIP1, CDH1, CDKN2A (p14ARF), CDKN2A (p16INK4a), CHEK2, CTNNA1, DICER1, EPCAM (Deletion/duplication testing only), GREM1 (promoter region deletion/duplication testing only), KIT, MEN1, MLH1, MSH2, MSH3, MSH6, MUTYH, NBN, NF1, NHTL1, PALB2, PDGFRA, PMS2, POLD1, POLE, PTEN, RAD50, RAD51C, RAD51D, SDHB, SDHC, SDHD, SMAD4, SMARCA4. STK11, TP53, TSC1, TSC2, and VHL.  The following genes were evaluated for sequence changes only: SDHA and HOXB13 c.251G>A variant only.       Results:  POSITIVE for a mutation in BRCA2 c.[id number] (p.Asp1540Glyfs*6).  The date of this test report is 08/18/2017.       CHIEF COMPLIANT: Follow-up on Taxol and carboplatin  INTERVAL HISTORY: Khailee Mick is a 53 year old with above-mentioned history of right breast cancer is currently neoadjuvant chemotherapy and today is cycle 6 of Taxol carboplatin.  Overall she appears to have tolerated the combination reasonably well.  We are monitoring her blood counts very closely.  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  She does have fatigue and loss of taste and appetite.  REVIEW OF SYSTEMS:   Constitutional: Denies fevers, chills or abnormal weight loss Eyes: Denies blurriness of vision Ears, nose, mouth, throat, and face: Denies mucositis or sore throat Respiratory: Denies cough, dyspnea or wheezes Cardiovascular: Denies palpitation, chest discomfort Gastrointestinal:  Denies nausea, heartburn or change in bowel habits Skin: Denies abnormal skin rashes Lymphatics: Denies new lymphadenopathy or easy bruising Neurological: Neuropathy in the toes intermittently Behavioral/Psych: Mood is stable, no new changes  Extremities: No lower extremity edema  All other systems were reviewed with the patient and are negative.  I have reviewed the past medical history, past surgical history, social history and family history with the patient and they are unchanged from previous note.  ALLERGIES:  has No Known Allergies.  MEDICATIONS:  Current Outpatient Medications  Medication Sig Dispense Refill  . acetaminophen (TYLENOL) 325 MG tablet Take 650 mg by mouth as needed.    Marland Kitchen amLODipine (NORVASC) 10 MG tablet Take 10 mg by mouth every morning.     Marland Kitchen atorvastatin (LIPITOR) 80 MG tablet Take 80 mg by mouth every morning.     . Calcium Carbonate-Vitamin D (CALCIUM 500/D  PO) Take 2 tablets by mouth daily.    . Cholecalciferol (VITAMIN D3 PO) Take by mouth.    . Cyanocobalamin (VITAMIN B-12 PO) Take  1 tablet by mouth every morning.    . lidocaine-prilocaine (EMLA) cream Apply to affected area once 30 g 3  . lisinopril-hydrochlorothiazide (PRINZIDE,ZESTORETIC) 20-12.5 MG per tablet Take 2 tablets by mouth daily.     Marland Kitchen LORazepam (ATIVAN) 0.5 MG tablet Take 1 tablet (0.5 mg total) by mouth every 6 (six) hours as needed (Nausea or vomiting). 30 tablet 0  . metoprolol succinate (TOPROL-XL) 25 MG 24 hr tablet Take 25 mg by mouth every morning.     . ondansetron (ZOFRAN) 8 MG tablet Take 1 tablet (8 mg total) by mouth 2 (two) times daily as needed. Start on the third day after chemotherapy. 30 tablet 1  . pantoprazole (PROTONIX) 40 MG tablet Take 40 mg by mouth daily.    . prochlorperazine (COMPAZINE) 10 MG tablet TAKE 1 TABLET(10 MG) BY MOUTH EVERY 6 HOURS AS NEEDED FOR NAUSEA OR VOMITING 385 tablet 1  . sitaGLIPtin-metformin (JANUMET) 50-500 MG per tablet Take 1 tablet by mouth 2 (two) times daily with a meal.      No current facility-administered medications for this visit.    Facility-Administered Medications Ordered in Other Visits  Medication Dose Route Frequency Provider Last Rate Last Dose  . sodium chloride flush (NS) 0.9 % injection 10 mL  10 mL Intravenous PRN Nicholas Lose, MD   10 mL at 08/03/17 1516  . sodium chloride flush (NS) 0.9 % injection 10 mL  10 mL Intravenous PRN Nicholas Lose, MD   10 mL at 08/17/17 1531  . sodium chloride flush (NS) 0.9 % injection 10 mL  10 mL Intravenous PRN Nicholas Lose, MD   10 mL at 10/26/17 1014    PHYSICAL EXAMINATION: ECOG PERFORMANCE STATUS: 1 - Symptomatic but completely ambulatory  Vitals:   11/02/17 0958  BP: 110/82  Pulse: 79  Resp: 18  Temp: 99 F (37.2 C)  SpO2: 100%   Filed Weights   11/02/17 0958  Weight: 183 lb 14.4 oz (83.4 kg)    GENERAL:alert, no distress and comfortable SKIN: skin color, texture, turgor are normal, no rashes or significant lesions EYES: normal, Conjunctiva are pink and non-injected, sclera  clear OROPHARYNX:no exudate, no erythema and lips, buccal mucosa, and tongue normal  NECK: supple, thyroid normal size, non-tender, without nodularity LYMPH:  no palpable lymphadenopathy in the cervical, axillary or inguinal LUNGS: clear to auscultation and percussion with normal breathing effort HEART: regular rate & rhythm and no murmurs and no lower extremity edema ABDOMEN:abdomen soft, non-tender and normal bowel sounds MUSCULOSKELETAL:no cyanosis of digits and no clubbing  NEURO: alert & oriented x 3 with fluent speech, neuropathy in her feet EXTREMITIES: No lower extremity edema  LABORATORY DATA:  I have reviewed the data as listed   Chemistry      Component Value Date/Time   NA 141 10/26/2017 1007   K 3.7 10/26/2017 1007   CL 102 05/15/2015 0407   CO2 25 10/26/2017 1007   BUN 15.6 10/26/2017 1007   CREATININE 0.9 10/26/2017 1007      Component Value Date/Time   CALCIUM 9.8 10/26/2017 1007   ALKPHOS 53 10/26/2017 1007   AST 18 10/26/2017 1007   ALT 20 10/26/2017 1007   BILITOT 0.39 10/26/2017 1007       Lab Results  Component Value Date   WBC 2.7 (L) 11/02/2017  HGB 10.5 (L) 11/02/2017   HCT 31.4 (L) 11/02/2017   MCV 99.7 11/02/2017   PLT 181 11/02/2017   NEUTROABS 1.4 (L) 11/02/2017    ASSESSMENT & PLAN:  Malignant neoplasm of upper-outer quadrant of right breast in female, estrogen receptor negative ([place]) 07/17/2017: Right upper outer quadrant painful palpable right breast mass by ultrasound measured 1 cm with 3 abnormal axillary lymph nodes 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 Neoadjuvant chemotherapy with Adriamycin and Cytoxan dose dense 4 followed by Taxol weekly 12 2. Followed bybilateral mastectomies with reconstructionwith sentinel lymph node study + targeted axillary dissection 3. Followed by adjuvant radiation  therapy Genetic testing: BRCA2 mutation positive --------------------------------------------------------------------------- Current treatment: Completed 4 cycles ofneoadjuvant dose dense Adriamycin and Cytoxan, today is cycle 6 Taxol carboplatin  Chemotherapy toxicities: Intermittent neuropathy in her toes. Fatigue Decreased appetite ANC 1400: I reduced the dosage of Taxol from 50 mg/m to 45 mg/m square with cycle 6  Return weekly for chemo and in 2 weeks for follow up with me  I spent 25 minutes talking to the patient of which more than half was spent in counseling and coordination of care.  No orders of the defined types were placed in this encounter.  The patient has a good understanding of the overall plan. she agrees with it. she will call with any problems that may develop before the next visit here.   Harriette Ohara, MD 11/02/17

## 2017-11-03 DIAGNOSIS — C50911 Malignant neoplasm of unspecified site of right female breast: Secondary | ICD-10-CM | POA: Diagnosis not present

## 2017-11-09 ENCOUNTER — Inpatient Hospital Stay: Payer: 59 | Attending: Hematology and Oncology

## 2017-11-09 ENCOUNTER — Inpatient Hospital Stay: Payer: 59

## 2017-11-09 VITALS — BP 107/62 | HR 90 | Temp 98.5°F | Resp 18

## 2017-11-09 DIAGNOSIS — C773 Secondary and unspecified malignant neoplasm of axilla and upper limb lymph nodes: Secondary | ICD-10-CM | POA: Insufficient documentation

## 2017-11-09 DIAGNOSIS — Z1509 Genetic susceptibility to other malignant neoplasm: Secondary | ICD-10-CM | POA: Diagnosis not present

## 2017-11-09 DIAGNOSIS — C50411 Malignant neoplasm of upper-outer quadrant of right female breast: Secondary | ICD-10-CM | POA: Insufficient documentation

## 2017-11-09 DIAGNOSIS — Z5111 Encounter for antineoplastic chemotherapy: Secondary | ICD-10-CM | POA: Diagnosis not present

## 2017-11-09 DIAGNOSIS — Z171 Estrogen receptor negative status [ER-]: Principal | ICD-10-CM

## 2017-11-09 LAB — CBC WITH DIFFERENTIAL/PLATELET
BASOS ABS: 0 10*3/uL (ref 0.0–0.1)
BASOS PCT: 0 %
EOS ABS: 0 10*3/uL (ref 0.0–0.5)
Eosinophils Relative: 1 %
HEMATOCRIT: 31.7 % — AB (ref 34.8–46.6)
Hemoglobin: 10.6 g/dL — ABNORMAL LOW (ref 11.6–15.9)
Lymphocytes Relative: 38 %
Lymphs Abs: 1.1 10*3/uL (ref 0.9–3.3)
MCH: 34 pg (ref 25.1–34.0)
MCHC: 33.4 g/dL (ref 31.5–36.0)
MCV: 101.6 fL — ABNORMAL HIGH (ref 79.5–101.0)
MONO ABS: 0.2 10*3/uL (ref 0.1–0.9)
MONOS PCT: 6 %
NEUTROS ABS: 1.6 10*3/uL (ref 1.5–6.5)
NEUTROS PCT: 55 %
Platelets: 209 10*3/uL (ref 145–400)
RBC: 3.12 MIL/uL — ABNORMAL LOW (ref 3.70–5.45)
RDW: 15 % (ref 11.2–16.1)
WBC: 2.9 10*3/uL — ABNORMAL LOW (ref 3.9–10.3)

## 2017-11-09 LAB — COMPREHENSIVE METABOLIC PANEL
ALK PHOS: 44 U/L (ref 40–150)
ALT: 23 U/L (ref 0–55)
ANION GAP: 9 (ref 3–11)
AST: 21 U/L (ref 5–34)
Albumin: 4.2 g/dL (ref 3.5–5.0)
BILIRUBIN TOTAL: 0.4 mg/dL (ref 0.2–1.2)
BUN: 13 mg/dL (ref 7–26)
CALCIUM: 9.9 mg/dL (ref 8.4–10.4)
CO2: 28 mmol/L (ref 22–29)
CREATININE: 0.92 mg/dL (ref 0.60–1.10)
Chloride: 105 mmol/L (ref 98–109)
GFR calc non Af Amer: >60 mL/min (ref >60)
GLUCOSE: 106 mg/dL (ref 70–140)
Potassium: 3.9 mmol/L (ref 3.3–4.7)
Sodium: 142 mmol/L (ref 136–145)
TOTAL PROTEIN: 7.3 g/dL (ref 6.4–8.3)

## 2017-11-09 MED ORDER — SODIUM CHLORIDE 0.9 % IV SOLN
Freq: Once | INTRAVENOUS | Status: AC
  Administered 2017-11-09: 11:00:00 via INTRAVENOUS

## 2017-11-09 MED ORDER — DEXAMETHASONE SODIUM PHOSPHATE 100 MG/10ML IJ SOLN
20.0000 mg | Freq: Once | INTRAMUSCULAR | Status: AC
  Administered 2017-11-09: 20 mg via INTRAVENOUS
  Filled 2017-11-09: qty 2

## 2017-11-09 MED ORDER — DIPHENHYDRAMINE HCL 50 MG/ML IJ SOLN
50.0000 mg | Freq: Once | INTRAMUSCULAR | Status: AC
  Administered 2017-11-09: 50 mg via INTRAVENOUS

## 2017-11-09 MED ORDER — HEPARIN SOD (PORK) LOCK FLUSH 100 UNIT/ML IV SOLN
500.0000 [IU] | Freq: Once | INTRAVENOUS | Status: AC | PRN
  Administered 2017-11-09: 500 [IU]
  Filled 2017-11-09: qty 5

## 2017-11-09 MED ORDER — PACLITAXEL CHEMO INJECTION 300 MG/50ML
45.0000 mg/m2 | Freq: Once | INTRAVENOUS | Status: AC
  Administered 2017-11-09: 84 mg via INTRAVENOUS
  Filled 2017-11-09: qty 14

## 2017-11-09 MED ORDER — FAMOTIDINE IN NACL 20-0.9 MG/50ML-% IV SOLN
20.0000 mg | Freq: Once | INTRAVENOUS | Status: AC
  Administered 2017-11-09: 20 mg via INTRAVENOUS

## 2017-11-09 MED ORDER — DIPHENHYDRAMINE HCL 50 MG/ML IJ SOLN
INTRAMUSCULAR | Status: AC
  Filled 2017-11-09: qty 1

## 2017-11-09 MED ORDER — SODIUM CHLORIDE 0.9 % IV SOLN
235.0000 mg | Freq: Once | INTRAVENOUS | Status: AC
  Administered 2017-11-09: 240 mg via INTRAVENOUS
  Filled 2017-11-09: qty 24

## 2017-11-09 MED ORDER — FAMOTIDINE IN NACL 20-0.9 MG/50ML-% IV SOLN
INTRAVENOUS | Status: AC
  Filled 2017-11-09: qty 50

## 2017-11-09 MED ORDER — SODIUM CHLORIDE 0.9% FLUSH
10.0000 mL | INTRAVENOUS | Status: DC | PRN
  Administered 2017-11-09: 10 mL
  Filled 2017-11-09: qty 10

## 2017-11-09 NOTE — Patient Instructions (Signed)
[facility] Discharge Instructions for Patients Receiving Chemotherapy  Today you received the following chemotherapy agents:  Taxol, Carboplatin  To help prevent nausea and vomiting after your treatment, we encourage you to take your nausea medication as prescribed.   If you develop nausea and vomiting that is not controlled by your nausea medication, call the clinic.   BELOW ARE SYMPTOMS THAT SHOULD BE REPORTED IMMEDIATELY:  *FEVER GREATER THAN 100.5 F  *CHILLS WITH OR WITHOUT FEVER  NAUSEA AND VOMITING THAT IS NOT CONTROLLED WITH YOUR NAUSEA MEDICATION  *UNUSUAL SHORTNESS OF BREATH  *UNUSUAL BRUISING OR BLEEDING  TENDERNESS IN MOUTH AND THROAT WITH OR WITHOUT PRESENCE OF ULCERS  *URINARY PROBLEMS  *BOWEL PROBLEMS  UNUSUAL RASH Items with * indicate a potential emergency and should be followed up as soon as possible.  Feel free to call the clinic should you have any questions or concerns. The clinic phone number is [phone].  Please show the CHEMO ALERT CARD at check-in to the Emergency Department and triage nurse.   

## 2017-11-10 DIAGNOSIS — Z9884 Bariatric surgery status: Secondary | ICD-10-CM | POA: Diagnosis not present

## 2017-11-15 NOTE — Assessment & Plan Note (Signed)
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 Neoadjuvant chemotherapy with Adriamycin and Cytoxan dose dense 4 followed by Taxol weekly 12 2. Followed bybilateral mastectomies with reconstructionwith sentinel lymph node study + targeted axillary dissection 3. Followed by adjuvant radiation therapy Genetic testing:BRCA2 mutation positive --------------------------------------------------------------------------- Current treatment: Completed 4 cycles ofneoadjuvant dose dense Adriamycin and Cytoxan, today is cycle 8 Taxol carboplatin  Chemotherapy toxicities: Intermittent neuropathy in her toes. Fatigue Decreased appetite ANC 1400: I reduced the dosage of Taxol from 50 mg/m to 45 mg/m square with cycle 6  Return weekly for chemo and in 2 weeks for follow up with me

## 2017-11-16 ENCOUNTER — Inpatient Hospital Stay: Payer: 59

## 2017-11-16 ENCOUNTER — Inpatient Hospital Stay (HOSPITAL_BASED_OUTPATIENT_CLINIC_OR_DEPARTMENT_OTHER): Payer: 59 | Admitting: Hematology and Oncology

## 2017-11-16 DIAGNOSIS — Z1509 Genetic susceptibility to other malignant neoplasm: Secondary | ICD-10-CM

## 2017-11-16 DIAGNOSIS — C50411 Malignant neoplasm of upper-outer quadrant of right female breast: Secondary | ICD-10-CM | POA: Diagnosis not present

## 2017-11-16 DIAGNOSIS — Z171 Estrogen receptor negative status [ER-]: Secondary | ICD-10-CM

## 2017-11-16 DIAGNOSIS — Z5111 Encounter for antineoplastic chemotherapy: Secondary | ICD-10-CM | POA: Diagnosis not present

## 2017-11-16 DIAGNOSIS — C773 Secondary and unspecified malignant neoplasm of axilla and upper limb lymph nodes: Secondary | ICD-10-CM | POA: Diagnosis not present

## 2017-11-16 LAB — COMPREHENSIVE METABOLIC PANEL
ALT: 27 U/L (ref 0–55)
ANION GAP: 9 (ref 3–11)
AST: 23 U/L (ref 5–34)
Albumin: 4.2 g/dL (ref 3.5–5.0)
Alkaline Phosphatase: 50 U/L (ref 40–150)
BUN: 13 mg/dL (ref 7–26)
CHLORIDE: 103 mmol/L (ref 98–109)
CO2: 27 mmol/L (ref 22–29)
Calcium: 9.7 mg/dL (ref 8.4–10.4)
Creatinine, Ser: 1.03 mg/dL (ref 0.60–1.10)
GFR calc Af Amer: >60 mL/min (ref >60)
GFR calc non Af Amer: >60 mL/min (ref >60)
Glucose, Bld: 88 mg/dL (ref 70–140)
Potassium: 4 mmol/L (ref 3.3–4.7)
Sodium: 139 mmol/L (ref 136–145)
Total Bilirubin: 0.5 mg/dL (ref 0.2–1.2)
Total Protein: 7.4 g/dL (ref 6.4–8.3)

## 2017-11-16 LAB — CBC WITH DIFFERENTIAL/PLATELET
BASOS PCT: 0 %
Basophils Absolute: 0 10*3/uL (ref 0.0–0.1)
Eosinophils Absolute: 0 10*3/uL (ref 0.0–0.5)
Eosinophils Relative: 1 %
HEMATOCRIT: 31.8 % — AB (ref 34.8–46.6)
HEMOGLOBIN: 10.6 g/dL — AB (ref 11.6–15.9)
LYMPHS ABS: 1 10*3/uL (ref 0.9–3.3)
Lymphocytes Relative: 44 %
MCH: 33.8 pg (ref 25.1–34.0)
MCHC: 33.3 g/dL (ref 31.5–36.0)
MCV: 101.3 fL — ABNORMAL HIGH (ref 79.5–101.0)
MONOS PCT: 8 %
Monocytes Absolute: 0.2 10*3/uL (ref 0.1–0.9)
NEUTROS ABS: 1.1 10*3/uL — AB (ref 1.5–6.5)
Neutrophils Relative %: 47 %
Platelets: 190 10*3/uL (ref 145–400)
RBC: 3.14 MIL/uL — ABNORMAL LOW (ref 3.70–5.45)
RDW: 14.6 % (ref 11.2–16.1)
WBC: 2.3 10*3/uL — ABNORMAL LOW (ref 3.9–10.3)

## 2017-11-16 MED ORDER — SODIUM CHLORIDE 0.9% FLUSH
10.0000 mL | INTRAVENOUS | Status: DC | PRN
  Administered 2017-11-16: 10 mL via INTRAVENOUS
  Filled 2017-11-16: qty 10

## 2017-11-16 MED ORDER — SODIUM CHLORIDE 0.9% FLUSH
10.0000 mL | INTRAVENOUS | Status: DC | PRN
  Administered 2017-11-16: 10 mL
  Filled 2017-11-16: qty 10

## 2017-11-16 MED ORDER — HEPARIN SOD (PORK) LOCK FLUSH 100 UNIT/ML IV SOLN
500.0000 [IU] | Freq: Once | INTRAVENOUS | Status: AC | PRN
  Administered 2017-11-16: 500 [IU]
  Filled 2017-11-16: qty 5

## 2017-11-16 MED ORDER — SODIUM CHLORIDE 0.9 % IV SOLN
20.0000 mg | Freq: Once | INTRAVENOUS | Status: AC
  Administered 2017-11-16: 20 mg via INTRAVENOUS
  Filled 2017-11-16: qty 2

## 2017-11-16 MED ORDER — SODIUM CHLORIDE 0.9 % IV SOLN
Freq: Once | INTRAVENOUS | Status: AC
  Administered 2017-11-16: 11:00:00 via INTRAVENOUS

## 2017-11-16 MED ORDER — DIPHENHYDRAMINE HCL 50 MG/ML IJ SOLN
INTRAMUSCULAR | Status: AC
  Filled 2017-11-16: qty 1

## 2017-11-16 MED ORDER — SODIUM CHLORIDE 0.9 % IV SOLN
45.0000 mg/m2 | Freq: Once | INTRAVENOUS | Status: AC
  Administered 2017-11-16: 84 mg via INTRAVENOUS
  Filled 2017-11-16: qty 14

## 2017-11-16 MED ORDER — FAMOTIDINE IN NACL 20-0.9 MG/50ML-% IV SOLN
20.0000 mg | Freq: Once | INTRAVENOUS | Status: AC
  Administered 2017-11-16: 20 mg via INTRAVENOUS

## 2017-11-16 MED ORDER — FAMOTIDINE IN NACL 20-0.9 MG/50ML-% IV SOLN
INTRAVENOUS | Status: AC
  Filled 2017-11-16: qty 50

## 2017-11-16 MED ORDER — DIPHENHYDRAMINE HCL 50 MG/ML IJ SOLN
50.0000 mg | Freq: Once | INTRAMUSCULAR | Status: AC
  Administered 2017-11-16: 50 mg via INTRAVENOUS

## 2017-11-16 NOTE — Progress Notes (Signed)
Patient Care Team: Cari Caraway, MD as PCP - General (Family Medicine) Christophe Louis, MD as Consulting Physician (Obstetrics and Gynecology) Alphonsa Overall, MD as Consulting Physician (General Surgery) Nicholas Lose, MD as Consulting Physician (Hematology and Oncology) Eppie Gibson, MD as Attending Physician (Radiation Oncology)  DIAGNOSIS:  Encounter Diagnosis  Name Primary?  . 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  The Common Hereditary Cancers Panel was ordered.  The Hereditary Gene Panel offered by [facility] includes sequencing and/or deletion duplication testing of the following 46 genes: APC, ATM, AXIN2, BARD1, BMPR1A, BRCA1, BRCA2, BRIP1, CDH1, CDKN2A (p14ARF), CDKN2A (p16INK4a), CHEK2, CTNNA1, DICER1, EPCAM (Deletion/duplication testing only), GREM1 (promoter region deletion/duplication testing only), KIT, MEN1, MLH1, MSH2, MSH3, MSH6, MUTYH, NBN, NF1, NHTL1, PALB2, PDGFRA, PMS2, POLD1, POLE, PTEN, RAD50, RAD51C, RAD51D, SDHB, SDHC, SDHD, SMAD4, SMARCA4. STK11, TP53, TSC1, TSC2, and VHL.  The following genes were evaluated for sequence changes only: SDHA and HOXB13 c.251G>A variant only.       Results:  POSITIVE for a mutation in BRCA2 c.[id number] (p.Asp1540Glyfs*6).  The date of this test report is 08/18/2017.       CHIEF COMPLIANT: Cycle 8 Taxol and carboplatin  INTERVAL HISTORY: Kara Crawford is a 52 year old with above-mentioned history of BRCA mutation positive right breast cancer currently on neoadjuvant chemotherapy and she is currently receiving Taxol and carboplatin.  She has been tolerating it reasonably well.  Does not have any nausea vomiting.  We are monitoring very closely her blood work as well as signs or symptoms of neuropathy.  REVIEW OF SYSTEMS:   Constitutional: Denies fevers, chills or abnormal weight loss Eyes: Denies blurriness of vision Ears, nose, mouth, throat, and face: Denies mucositis or sore throat Respiratory: Denies cough, dyspnea or wheezes Cardiovascular: Denies palpitation, chest discomfort Gastrointestinal:  Denies nausea, heartburn or change in bowel habits Skin: Denies abnormal skin rashes Lymphatics: Denies new lymphadenopathy or easy bruising Neurological:Denies numbness, tingling or new weaknesses Behavioral/Psych: Mood is stable, no new changes  Extremities: No lower extremity edema Breast:  denies any pain or lumps or nodules in either breasts All other systems were reviewed with the patient and are negative.  I have reviewed the past medical history, past surgical history, social history and family history with the patient and they are unchanged from previous note.  ALLERGIES:  has No Known Allergies.  MEDICATIONS:  Current Outpatient Medications  Medication Sig Dispense Refill  . acetaminophen (TYLENOL) 325 MG tablet Take 650 mg by mouth as needed.    Marland Kitchen amLODipine (NORVASC) 10 MG tablet Take 10 mg by mouth every morning.     Marland Kitchen atorvastatin (LIPITOR) 80 MG tablet Take 80 mg by mouth every morning.     Marland Kitchen  Calcium Carbonate-Vitamin D (CALCIUM 500/D PO) Take 2 tablets by mouth daily.    . Cholecalciferol (VITAMIN D3 PO) Take by mouth.     . Cyanocobalamin (VITAMIN B-12 PO) Take 1 tablet by mouth every morning.    . lidocaine-prilocaine (EMLA) cream Apply to affected area once 30 g 3  . lisinopril-hydrochlorothiazide (PRINZIDE,ZESTORETIC) 20-12.5 MG per tablet Take 2 tablets by mouth daily.     Marland Kitchen LORazepam (ATIVAN) 0.5 MG tablet Take 1 tablet (0.5 mg total) by mouth every 6 (six) hours as needed (Nausea or vomiting). 30 tablet 0  . metoprolol succinate (TOPROL-XL) 25 MG 24 hr tablet Take 25 mg by mouth every morning.     . ondansetron (ZOFRAN) 8 MG tablet Take 1 tablet (8 mg total) by mouth 2 (two) times daily as needed. Start on the third day after chemotherapy. 30 tablet 1  . pantoprazole (PROTONIX) 40 MG tablet Take 40 mg by mouth daily.    . prochlorperazine (COMPAZINE) 10 MG tablet TAKE 1 TABLET(10 MG) BY MOUTH EVERY 6 HOURS AS NEEDED FOR NAUSEA OR VOMITING 385 tablet 1  . sitaGLIPtin-metformin (JANUMET) 50-500 MG per tablet Take 1 tablet by mouth 2 (two) times daily with a meal.      No current facility-administered medications for this visit.    Facility-Administered Medications Ordered in Other Visits  Medication Dose Route Frequency Provider Last Rate Last Dose  . 0.9 %  sodium chloride infusion   Intravenous Once Nicholas Lose, MD      . dexamethasone (DECADRON) 20 mg in sodium chloride 0.9 % 50 mL IVPB  20 mg Intravenous Once Nicholas Lose, MD      . diphenhydrAMINE (BENADRYL) injection 50 mg  50 mg Intravenous Once Nicholas Lose, MD      . famotidine (PEPCID) IVPB 20 mg premix  20 mg Intravenous Once Nicholas Lose, MD      . heparin lock flush 100 unit/mL  500 Units Intracatheter Once PRN Nicholas Lose, MD      . PACLitaxel (TAXOL) 84 mg in dextrose 5 % 250 mL chemo infusion (</= 78m/m2)  45 mg/m2 (Treatment Plan Recorded) Intravenous Once GNicholas Lose MD      . sodium chloride flush (NS) 0.9 % injection 10 mL  10 mL Intravenous PRN GNicholas Lose MD   10 mL at 08/03/17 1516  . sodium chloride flush (NS) 0.9 %  injection 10 mL  10 mL Intravenous PRN GNicholas Lose MD   10 mL at 08/17/17 1531  . sodium chloride flush (NS) 0.9 % injection 10 mL  10 mL Intravenous PRN GNicholas Lose MD   10 mL at 10/26/17 1014  . sodium chloride flush (NS) 0.9 % injection 10 mL  10 mL Intracatheter PRN GNicholas Lose MD        PHYSICAL EXAMINATION: ECOG PERFORMANCE STATUS: 1 - Symptomatic but completely ambulatory  Vitals:   11/16/17 1016  BP: 110/73  Pulse: 89  Resp: 18  Temp: 98.5 F (36.9 C)  SpO2: 99%   Filed Weights   11/16/17 1016  Weight: 182 lb 4.8 oz (82.7 kg)    GENERAL:alert, no distress and comfortable SKIN: skin color, texture, turgor are normal, no rashes or significant lesions EYES: normal, Conjunctiva are pink and non-injected, sclera clear OROPHARYNX:no exudate, no erythema and lips, buccal mucosa, and tongue normal  NECK: supple, thyroid normal size, non-tender, without nodularity LYMPH:  no palpable lymphadenopathy in the cervical, axillary or inguinal LUNGS: clear to auscultation and percussion with normal breathing  effort HEART: regular rate & rhythm and no murmurs and no lower extremity edema ABDOMEN:abdomen soft, non-tender and normal bowel sounds MUSCULOSKELETAL:no cyanosis of digits and no clubbing  NEURO: alert & oriented x 3 with fluent speech, no focal motor/sensory deficits EXTREMITIES: No lower extremity edema  LABORATORY DATA:  I have reviewed the data as listed CMP Latest Ref Rng & Units 11/16/2017 11/09/2017 11/02/2017  Glucose 70 - 140 mg/dL 88 106 96  BUN 7 - 26 mg/dL 13 13 12.5  Creatinine 0.60 - 1.10 mg/dL 1.03 0.92 0.9  Sodium 136 - 145 mmol/L 139 142 142  Potassium 3.3 - 4.7 mmol/L 4.0 3.9 3.7  Chloride 98 - 109 mmol/L 103 105 -  CO2 22 - 29 mmol/L _0 Calcium 8.4 - 10.4 mg/dL 9.7 9.9 9.8  Total Protein 6.4 - 8.3 g/dL 7.4 7.3 7.3  Total Bilirubin 0.2 - 1.2 mg/dL 0.5 0.4 0.53  Alkaline Phos 40 - 150 U/L 50 44 51  AST 5 - 34 U/L _1 ALT 0 - 55 U/L  _2 Lab Results  Component Value Date   WBC 2.3 (L) 11/16/2017   HGB 10.6 (L) 11/16/2017   HCT 31.8 (L) 11/16/2017   MCV 101.3 (H) 11/16/2017   PLT 190 11/16/2017   NEUTROABS 1.1 (L) 11/16/2017    ASSESSMENT & PLAN:  Malignant neoplasm of upper-outer quadrant of right breast in female, estrogen receptor negative ([place]) 07/17/2017: Right upper outer quadrant painful palpable right breast mass by ultrasound measured 1 cm with 3 abnormal axillary lymph nodes 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 Neoadjuvant chemotherapy with Adriamycin and Cytoxan dose dense 4 followed by Taxol weekly 12 2. Followed bybilateral mastectomies with reconstructionwith sentinel lymph node study + targeted axillary dissection 3. Followed by adjuvant radiation therapy Genetic testing:BRCA2 mutation positive --------------------------------------------------------------------------- Current treatment: Completed 4 cycles ofneoadjuvant dose dense Adriamycin and Cytoxan, today is cycle 8 Taxol carboplatin  Chemotherapy toxicities: Intermittent neuropathy in her toes. Fatigue Decreased appetite ANC 1100: I discontinued carboplatin with today's treatment. If in spite of this she remains low then we will give her Neupogen the day before chemo.  Return weekly for chemo and in 2 weeks for follow up with me  I spent 25 minutes talking to the patient of which more than half was spent in counseling and coordination of care.  No orders of the defined types were placed in this encounter.  The patient has a good understanding of the overall plan. she agrees with it. she will call with any problems that may develop before the next visit here.   Harriette Ohara, MD 11/16/17

## 2017-11-16 NOTE — Patient Instructions (Signed)
[place] Discharge Instructions for Patients Receiving Chemotherapy  Today you received the following chemotherapy agents paclitaxel (Taxol) and carboplatin (Paraplatin)   To help prevent nausea and vomiting after your treatment, we encourage you to take your nausea medication as directed.  If you develop nausea and vomiting that is not controlled by your nausea medication, call the clinic.   BELOW ARE SYMPTOMS THAT SHOULD BE REPORTED IMMEDIATELY:  *FEVER GREATER THAN 100.5 F  *CHILLS WITH OR WITHOUT FEVER  NAUSEA AND VOMITING THAT IS NOT CONTROLLED WITH YOUR NAUSEA MEDICATION  *UNUSUAL SHORTNESS OF BREATH  *UNUSUAL BRUISING OR BLEEDING  TENDERNESS IN MOUTH AND THROAT WITH OR WITHOUT PRESENCE OF ULCERS  *URINARY PROBLEMS  *BOWEL PROBLEMS  UNUSUAL RASH Items with * indicate a potential emergency and should be followed up as soon as possible.  Feel free to call the clinic you have any questions or concerns. The clinic phone number is [phone].

## 2017-11-16 NOTE — Progress Notes (Signed)
Ok to treat with today with ANC 1.1 per Dr. Lindi Adie. He is removing carboplatin today.  Cyndia Bent RN

## 2017-11-17 ENCOUNTER — Telehealth: Payer: Self-pay | Admitting: Hematology and Oncology

## 2017-11-17 NOTE — Telephone Encounter (Signed)
No 1/17 los -  

## 2017-11-23 ENCOUNTER — Inpatient Hospital Stay: Payer: 59

## 2017-11-23 VITALS — BP 120/76 | HR 98 | Temp 99.0°F | Resp 16

## 2017-11-23 DIAGNOSIS — C50411 Malignant neoplasm of upper-outer quadrant of right female breast: Secondary | ICD-10-CM

## 2017-11-23 DIAGNOSIS — Z171 Estrogen receptor negative status [ER-]: Principal | ICD-10-CM

## 2017-11-23 DIAGNOSIS — Z5111 Encounter for antineoplastic chemotherapy: Secondary | ICD-10-CM | POA: Diagnosis not present

## 2017-11-23 LAB — CBC WITH DIFFERENTIAL/PLATELET
Basophils Absolute: 0 10*3/uL (ref 0.0–0.1)
Basophils Relative: 1 %
Eosinophils Absolute: 0 10*3/uL (ref 0.0–0.5)
Eosinophils Relative: 0 %
HEMATOCRIT: 31.3 % — AB (ref 34.8–46.6)
Hemoglobin: 10.7 g/dL — ABNORMAL LOW (ref 11.6–15.9)
LYMPHS ABS: 1 10*3/uL (ref 0.9–3.3)
LYMPHS PCT: 35 %
MCH: 34.6 pg — AB (ref 25.1–34.0)
MCHC: 34 g/dL (ref 31.5–36.0)
MCV: 101.7 fL — AB (ref 79.5–101.0)
MONOS PCT: 9 %
Monocytes Absolute: 0.2 10*3/uL (ref 0.1–0.9)
NEUTROS ABS: 1.6 10*3/uL (ref 1.5–6.5)
NEUTROS PCT: 55 %
Platelets: 206 10*3/uL (ref 145–400)
RBC: 3.08 MIL/uL — AB (ref 3.70–5.45)
RDW: 15.4 % (ref 11.2–16.1)
WBC: 2.8 10*3/uL — ABNORMAL LOW (ref 3.9–10.3)

## 2017-11-23 LAB — COMPREHENSIVE METABOLIC PANEL
ALT: 29 U/L (ref 0–55)
AST: 23 U/L (ref 5–34)
Albumin: 4 g/dL (ref 3.5–5.0)
Alkaline Phosphatase: 48 U/L (ref 40–150)
Anion gap: 10 (ref 3–11)
BUN: 16 mg/dL (ref 7–26)
CHLORIDE: 104 mmol/L (ref 98–109)
CO2: 26 mmol/L (ref 22–29)
CREATININE: 0.93 mg/dL (ref 0.60–1.10)
Calcium: 9.7 mg/dL (ref 8.4–10.4)
GFR calc non Af Amer: >60 mL/min (ref >60)
Glucose, Bld: 115 mg/dL (ref 70–140)
POTASSIUM: 3.7 mmol/L (ref 3.3–4.7)
SODIUM: 140 mmol/L (ref 136–145)
Total Bilirubin: 0.3 mg/dL (ref 0.2–1.2)
Total Protein: 7.2 g/dL (ref 6.4–8.3)

## 2017-11-23 MED ORDER — DIPHENHYDRAMINE HCL 50 MG/ML IJ SOLN
50.0000 mg | Freq: Once | INTRAMUSCULAR | Status: AC
  Administered 2017-11-23: 50 mg via INTRAVENOUS

## 2017-11-23 MED ORDER — SODIUM CHLORIDE 0.9% FLUSH
10.0000 mL | INTRAVENOUS | Status: DC | PRN
  Administered 2017-11-23: 10 mL via INTRAVENOUS
  Filled 2017-11-23: qty 10

## 2017-11-23 MED ORDER — FAMOTIDINE IN NACL 20-0.9 MG/50ML-% IV SOLN
INTRAVENOUS | Status: AC
  Filled 2017-11-23: qty 50

## 2017-11-23 MED ORDER — SODIUM CHLORIDE 0.9 % IV SOLN
Freq: Once | INTRAVENOUS | Status: AC
  Administered 2017-11-23: 11:00:00 via INTRAVENOUS

## 2017-11-23 MED ORDER — SODIUM CHLORIDE 0.9% FLUSH
10.0000 mL | INTRAVENOUS | Status: DC | PRN
  Administered 2017-11-23: 10 mL
  Filled 2017-11-23: qty 10

## 2017-11-23 MED ORDER — HEPARIN SOD (PORK) LOCK FLUSH 100 UNIT/ML IV SOLN
500.0000 [IU] | Freq: Once | INTRAVENOUS | Status: AC | PRN
  Administered 2017-11-23: 500 [IU]
  Filled 2017-11-23: qty 5

## 2017-11-23 MED ORDER — DIPHENHYDRAMINE HCL 50 MG/ML IJ SOLN
INTRAMUSCULAR | Status: AC
  Filled 2017-11-23: qty 1

## 2017-11-23 MED ORDER — SODIUM CHLORIDE 0.9 % IV SOLN
45.0000 mg/m2 | Freq: Once | INTRAVENOUS | Status: AC
  Administered 2017-11-23: 84 mg via INTRAVENOUS
  Filled 2017-11-23: qty 14

## 2017-11-23 MED ORDER — SODIUM CHLORIDE 0.9 % IV SOLN
20.0000 mg | Freq: Once | INTRAVENOUS | Status: AC
  Administered 2017-11-23: 20 mg via INTRAVENOUS
  Filled 2017-11-23: qty 2

## 2017-11-23 MED ORDER — FAMOTIDINE IN NACL 20-0.9 MG/50ML-% IV SOLN
20.0000 mg | Freq: Once | INTRAVENOUS | Status: AC
  Administered 2017-11-23: 20 mg via INTRAVENOUS

## 2017-11-23 NOTE — Patient Instructions (Signed)
[facility] Discharge Instructions for Patients Receiving Chemotherapy  Today you received the following chemotherapy agents:  Taxol.  To help prevent nausea and vomiting after your treatment, we encourage you to take your nausea medication as directed.   If you develop nausea and vomiting that is not controlled by your nausea medication, call the clinic.   BELOW ARE SYMPTOMS THAT SHOULD BE REPORTED IMMEDIATELY:  *FEVER GREATER THAN 100.5 F  *CHILLS WITH OR WITHOUT FEVER  NAUSEA AND VOMITING THAT IS NOT CONTROLLED WITH YOUR NAUSEA MEDICATION  *UNUSUAL SHORTNESS OF BREATH  *UNUSUAL BRUISING OR BLEEDING  TENDERNESS IN MOUTH AND THROAT WITH OR WITHOUT PRESENCE OF ULCERS  *URINARY PROBLEMS  *BOWEL PROBLEMS  UNUSUAL RASH Items with * indicate a potential emergency and should be followed up as soon as possible.  Feel free to call the clinic should you have any questions or concerns. The clinic phone number is [phone].  Please show the CHEMO ALERT CARD at check-in to the Emergency Department and triage nurse.   

## 2017-11-29 NOTE — Assessment & Plan Note (Signed)
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 Neoadjuvant chemotherapy with Adriamycin and Cytoxan dose dense 4 followed by Taxol weekly 12 2. Followed bybilateral mastectomies with reconstructionwith sentinel lymph node study + targeted axillary dissection 3. Followed by adjuvant radiation therapy Genetic testing:BRCA2 mutation positive --------------------------------------------------------------------------- Current treatment: Completed 4 cycles ofneoadjuvant dose dense Adriamycin and Cytoxan, today is cycle10Taxol carboplatin  Chemotherapy toxicities: Intermittent neuropathy in her toes. Fatigue Decreased appetite ANC 1100: I discontinued carboplatin with today's treatment. If in spite of this she remains low then we will give her Neupogen the day before chemo.  Return weekly for chemo and in 2 weeks for follow up with me

## 2017-11-30 ENCOUNTER — Inpatient Hospital Stay: Payer: 59

## 2017-11-30 ENCOUNTER — Inpatient Hospital Stay (HOSPITAL_BASED_OUTPATIENT_CLINIC_OR_DEPARTMENT_OTHER): Payer: 59 | Admitting: Hematology and Oncology

## 2017-11-30 ENCOUNTER — Telehealth: Payer: Self-pay

## 2017-11-30 DIAGNOSIS — Z171 Estrogen receptor negative status [ER-]: Principal | ICD-10-CM

## 2017-11-30 DIAGNOSIS — C50411 Malignant neoplasm of upper-outer quadrant of right female breast: Secondary | ICD-10-CM

## 2017-11-30 DIAGNOSIS — C773 Secondary and unspecified malignant neoplasm of axilla and upper limb lymph nodes: Secondary | ICD-10-CM

## 2017-11-30 DIAGNOSIS — Z5111 Encounter for antineoplastic chemotherapy: Secondary | ICD-10-CM | POA: Diagnosis not present

## 2017-11-30 DIAGNOSIS — Z1509 Genetic susceptibility to other malignant neoplasm: Secondary | ICD-10-CM

## 2017-11-30 LAB — CBC WITH DIFFERENTIAL/PLATELET
BASOS ABS: 0 10*3/uL (ref 0.0–0.1)
BASOS PCT: 1 %
EOS PCT: 1 %
Eosinophils Absolute: 0 10*3/uL (ref 0.0–0.5)
HCT: 30.9 % — ABNORMAL LOW (ref 34.8–46.6)
Hemoglobin: 10.4 g/dL — ABNORMAL LOW (ref 11.6–15.9)
Lymphocytes Relative: 44 %
Lymphs Abs: 1 10*3/uL (ref 0.9–3.3)
MCH: 34.1 pg — ABNORMAL HIGH (ref 25.1–34.0)
MCHC: 33.7 g/dL (ref 31.5–36.0)
MCV: 101.3 fL — AB (ref 79.5–101.0)
MONO ABS: 0.2 10*3/uL (ref 0.1–0.9)
Monocytes Relative: 9 %
Neutro Abs: 1 10*3/uL — ABNORMAL LOW (ref 1.5–6.5)
Neutrophils Relative %: 45 %
PLATELETS: 185 10*3/uL (ref 145–400)
RBC: 3.05 MIL/uL — AB (ref 3.70–5.45)
RDW: 14.9 % — AB (ref 11.2–14.5)
WBC: 2.3 10*3/uL — AB (ref 3.9–10.3)

## 2017-11-30 LAB — COMPREHENSIVE METABOLIC PANEL
ALBUMIN: 4.2 g/dL (ref 3.5–5.0)
ALK PHOS: 51 U/L (ref 40–150)
ALT: 26 U/L (ref 0–55)
AST: 20 U/L (ref 5–34)
Anion gap: 9 (ref 3–11)
BILIRUBIN TOTAL: 0.4 mg/dL (ref 0.2–1.2)
BUN: 9 mg/dL (ref 7–26)
CALCIUM: 9.8 mg/dL (ref 8.4–10.4)
CO2: 28 mmol/L (ref 22–29)
Chloride: 103 mmol/L (ref 98–109)
Creatinine, Ser: 0.8 mg/dL (ref 0.60–1.10)
GFR calc Af Amer: >60 mL/min (ref >60)
GLUCOSE: 83 mg/dL (ref 70–140)
Potassium: 3.7 mmol/L (ref 3.5–5.1)
Sodium: 140 mmol/L (ref 136–145)
TOTAL PROTEIN: 7.2 g/dL (ref 6.4–8.3)

## 2017-11-30 MED ORDER — SODIUM CHLORIDE 0.9 % IV SOLN
45.0000 mg/m2 | Freq: Once | INTRAVENOUS | Status: AC
  Administered 2017-11-30: 84 mg via INTRAVENOUS
  Filled 2017-11-30: qty 14

## 2017-11-30 MED ORDER — SODIUM CHLORIDE 0.9% FLUSH
10.0000 mL | INTRAVENOUS | Status: DC | PRN
  Administered 2017-11-30: 10 mL
  Filled 2017-11-30: qty 10

## 2017-11-30 MED ORDER — FAMOTIDINE IN NACL 20-0.9 MG/50ML-% IV SOLN
20.0000 mg | Freq: Once | INTRAVENOUS | Status: AC
  Administered 2017-11-30: 20 mg via INTRAVENOUS

## 2017-11-30 MED ORDER — SODIUM CHLORIDE 0.9 % IV SOLN
20.0000 mg | Freq: Once | INTRAVENOUS | Status: AC
  Administered 2017-11-30: 20 mg via INTRAVENOUS
  Filled 2017-11-30: qty 2

## 2017-11-30 MED ORDER — SODIUM CHLORIDE 0.9% FLUSH
10.0000 mL | INTRAVENOUS | Status: DC | PRN
  Administered 2017-11-30: 10 mL via INTRAVENOUS
  Filled 2017-11-30: qty 10

## 2017-11-30 MED ORDER — DIPHENHYDRAMINE HCL 50 MG/ML IJ SOLN
50.0000 mg | Freq: Once | INTRAMUSCULAR | Status: AC
  Administered 2017-11-30: 50 mg via INTRAVENOUS

## 2017-11-30 MED ORDER — SODIUM CHLORIDE 0.9 % IV SOLN
Freq: Once | INTRAVENOUS | Status: AC
  Administered 2017-11-30: 11:00:00 via INTRAVENOUS

## 2017-11-30 MED ORDER — HEPARIN SOD (PORK) LOCK FLUSH 100 UNIT/ML IV SOLN
500.0000 [IU] | Freq: Once | INTRAVENOUS | Status: AC | PRN
  Administered 2017-11-30: 500 [IU]
  Filled 2017-11-30: qty 5

## 2017-11-30 MED ORDER — FAMOTIDINE IN NACL 20-0.9 MG/50ML-% IV SOLN
INTRAVENOUS | Status: AC
  Filled 2017-11-30: qty 50

## 2017-11-30 MED ORDER — DIPHENHYDRAMINE HCL 50 MG/ML IJ SOLN
INTRAMUSCULAR | Status: AC
  Filled 2017-11-30: qty 1

## 2017-11-30 NOTE — Patient Instructions (Signed)
[facility] Discharge Instructions for Patients Receiving Chemotherapy  Today you received the following chemotherapy agents Paclitaxel  To help prevent nausea and vomiting after your treatment, we encourage you to take your nausea medication as directed   If you develop nausea and vomiting that is not controlled by your nausea medication, call the clinic.   BELOW ARE SYMPTOMS THAT SHOULD BE REPORTED IMMEDIATELY:  *FEVER GREATER THAN 100.5 F  *CHILLS WITH OR WITHOUT FEVER  NAUSEA AND VOMITING THAT IS NOT CONTROLLED WITH YOUR NAUSEA MEDICATION  *UNUSUAL SHORTNESS OF BREATH  *UNUSUAL BRUISING OR BLEEDING  TENDERNESS IN MOUTH AND THROAT WITH OR WITHOUT PRESENCE OF ULCERS  *URINARY PROBLEMS  *BOWEL PROBLEMS  UNUSUAL RASH Items with * indicate a potential emergency and should be followed up as soon as possible.  Feel free to call the clinic should you have any questions or concerns. The clinic phone number is [phone].  Please show the CHEMO ALERT CARD at check-in to the Emergency Department and triage nurse.   

## 2017-11-30 NOTE — Telephone Encounter (Signed)
Ok to treat with todays labs per Dr. Gudena.  Shawnie Nicole N Mckaela Howley RN  

## 2017-11-30 NOTE — Progress Notes (Signed)
Patient Care Team: Cari Caraway, MD as PCP - General (Family Medicine) Christophe Louis, MD as Consulting Physician (Obstetrics and Gynecology) Alphonsa Overall, MD as Consulting Physician (General Surgery) Nicholas Lose, MD as Consulting Physician (Hematology and Oncology) Eppie Gibson, MD as Attending Physician (Radiation Oncology)  DIAGNOSIS:  Encounter Diagnosis  Name Primary?  . 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  The Common Hereditary Cancers Panel was ordered.  The Hereditary Gene Panel offered by [facility] includes sequencing and/or deletion duplication testing of the following 46 genes: APC, ATM, AXIN2, BARD1, BMPR1A, BRCA1, BRCA2, BRIP1, CDH1, CDKN2A (p14ARF), CDKN2A (p16INK4a), CHEK2, CTNNA1, DICER1, EPCAM (Deletion/duplication testing only), GREM1 (promoter region deletion/duplication testing only), KIT, MEN1, MLH1, MSH2, MSH3, MSH6, MUTYH, NBN, NF1, NHTL1, PALB2, PDGFRA, PMS2, POLD1, POLE, PTEN, RAD50, RAD51C, RAD51D, SDHB, SDHC, SDHD, SMAD4, SMARCA4. STK11, TP53, TSC1, TSC2, and VHL.  The following genes were evaluated for sequence changes only: SDHA and HOXB13 c.251G>A variant only.       Results:  POSITIVE for a mutation in BRCA2 c.[id number] (p.Asp1540Glyfs*6).  The date of this test report is 08/18/2017.       CHIEF COMPLIANT: Cycle 10 Taxol (carboplatin stopped with cycle 7)  INTERVAL HISTORY: Kara Crawford is a 52 year old with above-mentioned history of right breast cancer currently neoadjuvant chemotherapy today is cycle 10 of Taxol.  She is anxious to finish her treatment.  She continues to have fatigue as result of chemotherapy.  Denies any nausea vomiting.  She has mild peripheral neuropathy which has remained stable.  She has numbness on the bottom of her feet.     REVIEW OF SYSTEMS:   Constitutional: Denies fevers, chills or abnormal weight loss Eyes: Denies blurriness of vision Ears, nose, mouth, throat, and face: Denies mucositis or sore throat Respiratory: Denies cough, dyspnea or wheezes Cardiovascular: Denies palpitation, chest discomfort Gastrointestinal:  Denies nausea, heartburn or change in bowel habits Skin: Denies abnormal skin rashes Lymphatics: Denies new lymphadenopathy or easy bruising Neurological: Mild peripheral neuropathy on the bottom of her feet Behavioral/Psych: Mood is stable, no new changes  Extremities: No lower extremity edema  All other systems were reviewed with the patient and are negative.  I have reviewed the past medical history, past surgical history, social history and family history with the patient and they are unchanged from previous note.  ALLERGIES:  has No Known Allergies.  MEDICATIONS:  Current Outpatient Medications  Medication Sig Dispense Refill  . acetaminophen (TYLENOL) 325 MG tablet Take 650 mg by mouth as needed.    Marland Kitchen amLODipine (NORVASC) 10 MG tablet Take 10 mg by mouth every morning.     Marland Kitchen atorvastatin (LIPITOR) 80 MG tablet Take 80 mg by mouth every  morning.     . Calcium Carbonate-Vitamin D (CALCIUM 500/D PO) Take 2 tablets by mouth daily.    . Cholecalciferol (VITAMIN D3 PO) Take by mouth.    .  Cyanocobalamin (VITAMIN B-12 PO) Take 1 tablet by mouth every morning.    . lidocaine-prilocaine (EMLA) cream Apply to affected area once 30 g 3  . lisinopril-hydrochlorothiazide (PRINZIDE,ZESTORETIC) 20-12.5 MG per tablet Take 2 tablets by mouth daily.     Marland Kitchen LORazepam (ATIVAN) 0.5 MG tablet Take 1 tablet (0.5 mg total) by mouth every 6 (six) hours as needed (Nausea or vomiting). 30 tablet 0  . metoprolol succinate (TOPROL-XL) 25 MG 24 hr tablet Take 25 mg by mouth every morning.     . ondansetron (ZOFRAN) 8 MG tablet Take 1 tablet (8 mg total) by mouth 2 (two) times daily as needed. Start on the third day after chemotherapy. 30 tablet 1  . pantoprazole (PROTONIX) 40 MG tablet Take 40 mg by mouth daily.    . prochlorperazine (COMPAZINE) 10 MG tablet TAKE 1 TABLET(10 MG) BY MOUTH EVERY 6 HOURS AS NEEDED FOR NAUSEA OR VOMITING 385 tablet 1  . sitaGLIPtin-metformin (JANUMET) 50-500 MG per tablet Take 1 tablet by mouth 2 (two) times daily with a meal.      No current facility-administered medications for this visit.    Facility-Administered Medications Ordered in Other Visits  Medication Dose Route Frequency Provider Last Rate Last Dose  . sodium chloride flush (NS) 0.9 % injection 10 mL  10 mL Intravenous PRN Nicholas Lose, MD   10 mL at 08/03/17 1516  . sodium chloride flush (NS) 0.9 % injection 10 mL  10 mL Intravenous PRN Nicholas Lose, MD   10 mL at 08/17/17 1531  . sodium chloride flush (NS) 0.9 % injection 10 mL  10 mL Intravenous PRN Nicholas Lose, MD   10 mL at 10/26/17 1014  . sodium chloride flush (NS) 0.9 % injection 10 mL  10 mL Intravenous PRN Nicholas Lose, MD   10 mL at 11/30/17 1008    PHYSICAL EXAMINATION: ECOG PERFORMANCE STATUS: 1 - Symptomatic but completely ambulatory  Vitals:   11/30/17 1020  BP: 128/88  Pulse: 71  Resp: 20  Temp: 97.9 F (36.6 C)  SpO2: 100%   Filed Weights   11/30/17 1020  Weight: 181 lb 3.2 oz (82.2 kg)    GENERAL:alert, no distress and  comfortable SKIN: skin color, texture, turgor are normal, no rashes or significant lesions EYES: normal, Conjunctiva are pink and non-injected, sclera clear OROPHARYNX:no exudate, no erythema and lips, buccal mucosa, and tongue normal  NECK: supple, thyroid normal size, non-tender, without nodularity LYMPH:  no palpable lymphadenopathy in the cervical, axillary or inguinal LUNGS: clear to auscultation and percussion with normal breathing effort HEART: regular rate & rhythm and no murmurs and no lower extremity edema ABDOMEN:abdomen soft, non-tender and normal bowel sounds MUSCULOSKELETAL:no cyanosis of digits and no clubbing  NEURO: alert & oriented x 3 with fluent speech, no focal motor/sensory deficits EXTREMITIES: No lower extremity edema   LABORATORY DATA:  I have reviewed the data as listed CMP Latest Ref Rng & Units 11/30/2017 11/23/2017 11/16/2017  Glucose 70 - 140 mg/dL 83 115 88  BUN 7 - 26 mg/dL _0 Creatinine 0.60 - 1.10 mg/dL 0.80 0.93 1.03  Sodium 136 - 145 mmol/L 140 140 139  Potassium 3.5 - 5.1 mmol/L 3.7 3.7 4.0  Chloride 98 - 109 mmol/L 103 104 103  CO2 22 -  29 mmol/L _0 Calcium 8.4 - 10.4 mg/dL 9.8 9.7 9.7  Total Protein 6.4 - 8.3 g/dL 7.2 7.2 7.4  Total Bilirubin 0.2 - 1.2 mg/dL 0.4 0.3 0.5  Alkaline Phos 40 - 150 U/L 51 48 50  AST 5 - 34 U/L _1 ALT 0 - 55 U/L _2 Lab Results  Component Value Date   WBC 2.3 (L) 11/30/2017   HGB 10.4 (L) 11/30/2017   HCT 30.9 (L) 11/30/2017   MCV 101.3 (H) 11/30/2017   PLT 185 11/30/2017   NEUTROABS 1.0 (L) 11/30/2017    ASSESSMENT & PLAN:  Malignant neoplasm of upper-outer quadrant of right breast in female, estrogen receptor negative ([place]) 07/17/2017: Right upper outer quadrant painful palpable right breast mass by ultrasound measured 1 cm with 3 abnormal axillary lymph nodes 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 Neoadjuvant chemotherapy with Adriamycin and Cytoxan dose dense 4 followed by Taxol weekly 12 2. Followed bybilateral mastectomies with reconstructionwith sentinel lymph node study + targeted axillary dissection 3. Followed by adjuvant radiation therapy Genetic testing:BRCA2 mutation positive --------------------------------------------------------------------------- Current treatment: Completed 4 cycles ofneoadjuvant dose dense Adriamycin and Cytoxan, today is cycle10Taxol carboplatin  Chemotherapy toxicities: Intermittent neuropathy in her toes. Fatigue Decreased appetite ANC 1000:  Carboplatin was discontinued after cycle 7, currently she is at 45 mg a meter square of Taxol. She will proceed with the chemotherapy in spite of ANC of 1000.  Return weekly for chemo and in 2 weeks for follow up with me along with her last and final chemotherapy. We will start planning her breast MRI and surgery appointments.   I spent 25 minutes talking to the patient of which more than half was spent in counseling and coordination of care.  Orders Placed This Encounter  Procedures  . MR BREAST BILATERAL W WO CONTRAST INC CAD    Standing Status:   Future    Standing Expiration Date:   01/29/2019    Order Specific Question:   If indicated for the ordered procedure, I authorize the administration of contrast media per Radiology protocol    Answer:   Yes    Order Specific Question:   What is the patient's sedation requirement?    Answer:   No Sedation    Order Specific Question:   Does the patient have a pacemaker or implanted devices?    Answer:   No    Order Specific Question:   Radiology Contrast Protocol - do NOT remove file path    Answer:   [email or web address]    Order Specific Question:   Reason for Exam additional comments    Answer:   Post neoadj chemo    Order Specific Question:   Preferred imaging location?     Answer:   [facility] (table limit-350 lbs)   The patient has a good understanding of the overall plan. she agrees with it. she will call with any problems that may develop before the next visit here.   Harriette Ohara, MD 11/30/17

## 2017-11-30 NOTE — Patient Instructions (Signed)
Implanted Port Home Guide An implanted port is a type of central line that is placed under the skin. Central lines are used to provide IV access when treatment or nutrition needs to be given through a person's veins. Implanted ports are used for long-term IV access. An implanted port may be placed because:  You need IV medicine that would be irritating to the small veins in your hands or arms.  You need long-term IV medicines, such as antibiotics.  You need IV nutrition for a long period.  You need frequent blood draws for lab tests.  You need dialysis.  Implanted ports are usually placed in the chest area, but they can also be placed in the upper arm, the abdomen, or the leg. An implanted port has two main parts:  Reservoir. The reservoir is round and will appear as a small, raised area under your skin. The reservoir is the part where a needle is inserted to give medicines or draw blood.  Catheter. The catheter is a thin, flexible tube that extends from the reservoir. The catheter is placed into a large vein. Medicine that is inserted into the reservoir goes into the catheter and then into the vein.  How will I care for my incision site? Do not get the incision site wet. Bathe or shower as directed by your health care provider. How is my port accessed? Special steps must be taken to access the port:  Before the port is accessed, a numbing cream can be placed on the skin. This helps numb the skin over the port site.  Your health care provider uses a sterile technique to access the port. ? Your health care provider must put on a mask and sterile gloves. ? The skin over your port is cleaned carefully with an antiseptic and allowed to dry. ? The port is gently pinched between sterile gloves, and a needle is inserted into the port.  Only "non-coring" port needles should be used to access the port. Once the port is accessed, a blood return should be checked. This helps ensure that the port  is in the vein and is not clogged.  If your port needs to remain accessed for a constant infusion, a clear (transparent) bandage will be placed over the needle site. The bandage and needle will need to be changed every week, or as directed by your health care provider.  Keep the bandage covering the needle clean and dry. Do not get it wet. Follow your health care provider's instructions on how to take a shower or bath while the port is accessed.  If your port does not need to stay accessed, no bandage is needed over the port.  What is flushing? Flushing helps keep the port from getting clogged. Follow your health care provider's instructions on how and when to flush the port. Ports are usually flushed with saline solution or a medicine called heparin. The need for flushing will depend on how the port is used.  If the port is used for intermittent medicines or blood draws, the port will need to be flushed: ? After medicines have been given. ? After blood has been drawn. ? As part of routine maintenance.  If a constant infusion is running, the port may not need to be flushed.  How long will my port stay implanted? The port can stay in for as long as your health care provider thinks it is needed. When it is time for the port to come out, surgery will be   done to remove it. The procedure is similar to the one performed when the port was put in. When should I seek immediate medical care? When you have an implanted port, you should seek immediate medical care if:  You notice a bad smell coming from the incision site.  You have swelling, redness, or drainage at the incision site.  You have more swelling or pain at the port site or the surrounding area.  You have a fever that is not controlled with medicine.  This information is not intended to replace advice given to you by your health care provider. Make sure you discuss any questions you have with your health care provider. Document  Released: 10/17/2005 Document Revised: 03/24/2016 Document Reviewed: 06/24/2013 Elsevier Interactive Patient Education  2017 Elsevier Inc.  

## 2017-12-01 ENCOUNTER — Telehealth: Payer: Self-pay | Admitting: Hematology and Oncology

## 2017-12-01 NOTE — Telephone Encounter (Signed)
Mailed patient calendar of upcoming February appointments.

## 2017-12-07 ENCOUNTER — Ambulatory Visit: Payer: 59

## 2017-12-07 ENCOUNTER — Inpatient Hospital Stay: Payer: 59

## 2017-12-07 ENCOUNTER — Inpatient Hospital Stay: Payer: 59 | Attending: Hematology and Oncology | Admitting: Adult Health

## 2017-12-07 ENCOUNTER — Encounter: Payer: Self-pay | Admitting: Adult Health

## 2017-12-07 VITALS — BP 100/62 | HR 86 | Temp 98.4°F | Resp 18 | Ht 61.0 in | Wt 181.5 lb

## 2017-12-07 DIAGNOSIS — C50411 Malignant neoplasm of upper-outer quadrant of right female breast: Secondary | ICD-10-CM

## 2017-12-07 DIAGNOSIS — Z1509 Genetic susceptibility to other malignant neoplasm: Secondary | ICD-10-CM | POA: Diagnosis not present

## 2017-12-07 DIAGNOSIS — Z5111 Encounter for antineoplastic chemotherapy: Secondary | ICD-10-CM | POA: Diagnosis not present

## 2017-12-07 DIAGNOSIS — Z171 Estrogen receptor negative status [ER-]: Principal | ICD-10-CM

## 2017-12-07 DIAGNOSIS — D701 Agranulocytosis secondary to cancer chemotherapy: Secondary | ICD-10-CM | POA: Diagnosis not present

## 2017-12-07 DIAGNOSIS — G629 Polyneuropathy, unspecified: Secondary | ICD-10-CM | POA: Diagnosis not present

## 2017-12-07 DIAGNOSIS — Z803 Family history of malignant neoplasm of breast: Secondary | ICD-10-CM | POA: Diagnosis not present

## 2017-12-07 DIAGNOSIS — C773 Secondary and unspecified malignant neoplasm of axilla and upper limb lymph nodes: Secondary | ICD-10-CM | POA: Insufficient documentation

## 2017-12-07 LAB — COMPREHENSIVE METABOLIC PANEL
ALK PHOS: 52 U/L (ref 40–150)
ALT: 31 U/L (ref 0–55)
AST: 23 U/L (ref 5–34)
Albumin: 4.1 g/dL (ref 3.5–5.0)
Anion gap: 9 (ref 3–11)
BUN: 15 mg/dL (ref 7–26)
CALCIUM: 10.1 mg/dL (ref 8.4–10.4)
CO2: 28 mmol/L (ref 22–29)
CREATININE: 0.86 mg/dL (ref 0.60–1.10)
Chloride: 105 mmol/L (ref 98–109)
Glucose, Bld: 85 mg/dL (ref 70–140)
Potassium: 3.9 mmol/L (ref 3.5–5.1)
Sodium: 142 mmol/L (ref 136–145)
Total Bilirubin: 0.4 mg/dL (ref 0.2–1.2)
Total Protein: 7.3 g/dL (ref 6.4–8.3)

## 2017-12-07 LAB — CBC WITH DIFFERENTIAL/PLATELET
Basophils Absolute: 0 10*3/uL (ref 0.0–0.1)
Basophils Relative: 0 %
EOS PCT: 1 %
Eosinophils Absolute: 0 10*3/uL (ref 0.0–0.5)
HEMATOCRIT: 31.8 % — AB (ref 34.8–46.6)
Hemoglobin: 10.6 g/dL — ABNORMAL LOW (ref 11.6–15.9)
LYMPHS ABS: 1.3 10*3/uL (ref 0.9–3.3)
LYMPHS PCT: 45 %
MCH: 34.2 pg — AB (ref 25.1–34.0)
MCHC: 33.3 g/dL (ref 31.5–36.0)
MCV: 102.6 fL — AB (ref 79.5–101.0)
MONO ABS: 0.2 10*3/uL (ref 0.1–0.9)
Monocytes Relative: 6 %
NEUTROS ABS: 1.4 10*3/uL — AB (ref 1.5–6.5)
Neutrophils Relative %: 48 %
PLATELETS: 182 10*3/uL (ref 145–400)
RBC: 3.1 MIL/uL — AB (ref 3.70–5.45)
RDW: 13.4 % (ref 11.2–14.5)
WBC: 2.9 10*3/uL — AB (ref 3.9–10.3)

## 2017-12-07 MED ORDER — HEPARIN SOD (PORK) LOCK FLUSH 100 UNIT/ML IV SOLN
500.0000 [IU] | Freq: Once | INTRAVENOUS | Status: AC | PRN
  Administered 2017-12-07: 500 [IU]
  Filled 2017-12-07: qty 5

## 2017-12-07 MED ORDER — SODIUM CHLORIDE 0.9% FLUSH
10.0000 mL | INTRAVENOUS | Status: DC | PRN
  Administered 2017-12-07: 10 mL via INTRAVENOUS
  Filled 2017-12-07: qty 10

## 2017-12-07 MED ORDER — FAMOTIDINE IN NACL 20-0.9 MG/50ML-% IV SOLN
20.0000 mg | Freq: Once | INTRAVENOUS | Status: AC
  Administered 2017-12-07: 20 mg via INTRAVENOUS

## 2017-12-07 MED ORDER — SODIUM CHLORIDE 0.9 % IV SOLN
Freq: Once | INTRAVENOUS | Status: AC
  Administered 2017-12-07: 15:00:00 via INTRAVENOUS

## 2017-12-07 MED ORDER — FAMOTIDINE IN NACL 20-0.9 MG/50ML-% IV SOLN
INTRAVENOUS | Status: AC
  Filled 2017-12-07: qty 50

## 2017-12-07 MED ORDER — SODIUM CHLORIDE 0.9 % IV SOLN
20.0000 mg | Freq: Once | INTRAVENOUS | Status: AC
  Administered 2017-12-07: 20 mg via INTRAVENOUS
  Filled 2017-12-07: qty 2

## 2017-12-07 MED ORDER — DIPHENHYDRAMINE HCL 50 MG/ML IJ SOLN
50.0000 mg | Freq: Once | INTRAMUSCULAR | Status: AC
  Administered 2017-12-07: 50 mg via INTRAVENOUS

## 2017-12-07 MED ORDER — DIPHENHYDRAMINE HCL 50 MG/ML IJ SOLN
INTRAMUSCULAR | Status: AC
  Filled 2017-12-07: qty 1

## 2017-12-07 MED ORDER — SODIUM CHLORIDE 0.9 % IV SOLN
45.0000 mg/m2 | Freq: Once | INTRAVENOUS | Status: AC
  Administered 2017-12-07: 84 mg via INTRAVENOUS
  Filled 2017-12-07: qty 14

## 2017-12-07 MED ORDER — SODIUM CHLORIDE 0.9% FLUSH
10.0000 mL | INTRAVENOUS | Status: DC | PRN
Start: 2017-12-07 — End: 2017-12-07
  Administered 2017-12-07: 10 mL
  Filled 2017-12-07: qty 10

## 2017-12-07 NOTE — Assessment & Plan Note (Addendum)
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 Neoadjuvant chemotherapy with Adriamycin and Cytoxan dose dense 4 followed by Taxol weekly 12 2. Followed bybilateral mastectomies with reconstructionwith sentinel lymph node study + targeted axillary dissection 3. Followed by adjuvant radiation therapy Genetic testing:BRCA2 mutation positive --------------------------------------------------------------------------- Current treatment: Completed 4 cycles ofneoadjuvant dose dense Adriamycin and Cytoxan, today is cycle11 Taxol (Carbo dropped after 7 cycles due to neutropenia)  Chemotherapy toxicities: She is tolerating the Taxol well.  Her ANC today is 1.4.  I reviewed this with her in detail.  She will proceed with the Taxol.  She and I reviewed healthy diet and exercise, particularly in the hopes to start to recondition herself from chemotherapy.  She has her MRI scheduled on 2/15, and I spoke with Bary Castilla, RN to schedule f/u with Dr. Lucia Gaskins for surgical planning.  She is still planning on bilateral mastectomies.    Kara Crawford will return in one week for her final week of Taxol chemotherapy.

## 2017-12-07 NOTE — Progress Notes (Signed)
OK to treat with ANC 1.4 today per Mendel Ryder NP

## 2017-12-07 NOTE — Progress Notes (Signed)
[facility] Cancer Follow up:    Cari Caraway, MD [place] [address]   DIAGNOSIS: Cancer Staging Malignant neoplasm of upper-outer quadrant of right breast in female, estrogen receptor negative ([place]) Staging form: Breast, AJCC 8th Edition - Clinical stage from 07/26/2017: Stage IIB (cT1c, cN1, cM0, G3, ER: Negative, PR: Negative, HER2: Negative) - Unsigned Staging comments: Staged at breast conference on 9.26.18   SUMMARY OF ONCOLOGIC HISTORY:   Malignant neoplasm of upper-outer quadrant of right breast in female, estrogen receptor negative ([place])   07/17/2017 Initial Diagnosis    Right upper outer quadrant painful palpable right breast mass by ultrasound measured 1 cm with 3 abnormal axillary lymph nodes 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  The Common Hereditary Cancers Panel was ordered.  The Hereditary Gene Panel offered by [facility] includes sequencing and/or deletion duplication testing of the following 46 genes: APC, ATM, AXIN2, BARD1, BMPR1A, BRCA1, BRCA2, BRIP1, CDH1, CDKN2A (p14ARF), CDKN2A (p16INK4a), CHEK2, CTNNA1, DICER1, EPCAM (Deletion/duplication testing only), GREM1 (promoter region deletion/duplication testing only), KIT, MEN1, MLH1, MSH2, MSH3, MSH6, MUTYH, NBN, NF1, NHTL1, PALB2, PDGFRA, PMS2, POLD1, POLE, PTEN, RAD50, RAD51C, RAD51D, SDHB, SDHC, SDHD, SMAD4, SMARCA4. STK11, TP53, TSC1, TSC2, and VHL.  The following genes were evaluated for sequence changes only: SDHA and HOXB13 c.251G>A variant only.       Results: POSITIVE for a mutation in BRCA2  c.[id number] (p.Asp1540Glyfs*6).  The date of this test report is 08/18/2017.       CURRENT THERAPY: Taxol week 11 (Carbo dropped due to neutropenia)  INTERVAL HISTORY: Kara Crawford 52 y.o. female returns for evaluation prior to receiving her weekly neoadjuvant chemotherapy.  She is doing well today.  She denies any peripheral neuropathy.  She is looking forward to finishing her treatment.     Patient Active Problem List   Diagnosis Date Noted  . Genetic testing 08/18/2017  . BRCA2 positive 08/18/2017  . Family history of breast cancer   . Family history of ovarian cancer   . Family history of prostate cancer   . Malignant neoplasm of upper-outer quadrant of right breast in female, estrogen receptor negative ([place]) 07/25/2017  . Perforated bowel ([place]) 05/13/2015  . Free intraperitoneal air 05/12/2015  . Submucosal lesion of esophagus 10/06/2014  . 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 Diabetes ([place]) 09/24/2014  . Morbid obesity ([place]) 03/20/2014    has No Known Allergies.  MEDICAL HISTORY: Past Medical History:  Diagnosis Date  . Anemia    anemia prior to uterine emboliztion procedure.  . Breast cancer ([place])   . Diabetes mellitus without complication ([place])    type 2  . Family history of breast cancer   . Family history of ovarian cancer   . Family history of prostate cancer   . Fibroids   . Headache   . Heart murmur   . Hyperlipidemia   . Hypertension     SURGICAL HISTORY: Past Surgical History:  Procedure Laterality Date  . BREATH TEK H PYLORI N/A  04/17/2014   Procedure: BREATH TEK H PYLORI;  Surgeon: Shann Medal, MD;  Location: [facility];  Service: General;  Laterality: N/A;  . CESAREAN SECTION     x3- normal development  . ESOPHAGOGASTRODUODENOSCOPY N/A 08/20/2015   Procedure: ESOPHAGOGASTRODUODENOSCOPY (EGD);  Surgeon: Alphonsa Overall, MD;  Location: [facility];  Service: General;  Laterality: N/A;  .  EXPLORATORY LAPAROTOMY  05/13/2015  . GASTRIC ROUX-EN-Y N/A 10/06/2014   Procedure: LAPAROSCOPIC ROUX-EN-Y GASTRIC BYPASS WITH UPPER ENDOSCOPY;  Surgeon: Pedro Earls, MD;  Location: [facility];  Service: General;  Laterality: N/A;  . LAPAROSCOPY N/A 05/12/2015   Procedure: LAPAROSCOPY REPAIR OF PERFORATED BOWEL;  Surgeon: Alphonsa Overall, MD;  Location: [facility];  Service: General;  Laterality: N/A;  . PORTACATH PLACEMENT N/A 08/02/2017   Procedure: INSERTION PORT-A-CATH;  Surgeon: Alphonsa Overall, MD;  Location: [facility];  Service: General;  Laterality: N/A;  . UTERINE ARTERY EMBOLIZATION  08-16-2010    SOCIAL HISTORY: Social History   Socioeconomic History  . Marital status: Married    Spouse name: Not on file  . Number of children: Not on file  . Years of education: Not on file  . Highest education level: Not on file  Social Needs  . Financial resource strain: Not on file  . Food insecurity - worry: Not on file  . Food insecurity - inability: Not on file  . Transportation needs - medical: Not on file  . Transportation needs - non-medical: Not on file  Occupational History  . Not on file  Tobacco Use  . Smoking status: Never Smoker  . Smokeless tobacco: Never Used  Substance and Sexual Activity  . Alcohol use: Yes    Alcohol/week: 0.6 oz    Types: 1 Glasses of wine per week    Comment: social -rare occ.  . Drug use: No  . Sexual activity: Yes  Other Topics Concern  . Not on file  Social History Narrative  . Not on file    FAMILY HISTORY: Family History  Problem Relation Age of Onset  . Diabetes Mother   . Hypertension Mother   . Stroke Mother   . Diabetes Father   . Hypertension Father   . Prostate cancer Father 65       found at stage II  . Prostate cancer Paternal Uncle 38  . Breast cancer Paternal Aunt 66  . Cancer Maternal Aunt 55       type unknown  . Ovarian cancer Paternal Grandmother 35  . Cancer Other        type unk- dx in 40's  . Breast  cancer Cousin 65    Review of Systems  Constitutional: Negative for appetite change, chills, fatigue, fever and unexpected weight change.  HENT:   Negative for hearing loss and lump/mass.   Eyes: Negative for eye problems and icterus.  Respiratory: Negative for chest tightness, cough and shortness of breath.   Cardiovascular: Negative for chest pain, leg swelling and palpitations.  Gastrointestinal: Negative for abdominal distention, abdominal pain, constipation, diarrhea, nausea and vomiting.  Endocrine: Negative for hot flashes.  Musculoskeletal: Negative for arthralgias.  Skin: Negative for itching and rash.  Neurological: Negative for dizziness, extremity weakness and headaches.  Hematological: Negative for adenopathy. Does not bruise/bleed easily.  Psychiatric/Behavioral: Negative for depression. The patient is not nervous/anxious.       PHYSICAL EXAMINATION  ECOG PERFORMANCE STATUS: 1 - Symptomatic but completely ambulatory  Vitals:   12/07/17 1402  BP: 100/62  Pulse: 86  Resp: 18  Temp: 98.4 F (36.9 C)  SpO2: 99%    Physical Exam  Constitutional: She is oriented to person, place, and time and well-developed, well-nourished, and in no distress.  HENT:  Head: Normocephalic and atraumatic.  Mouth/Throat: Oropharynx is clear and moist. No oropharyngeal exudate.  Eyes: Pupils are equal, round, and reactive to light. No scleral icterus.  Neck: Neck supple.  Cardiovascular: Normal rate, regular rhythm and normal heart sounds.  Pulmonary/Chest: Effort normal and breath sounds normal. No respiratory distress. She has no wheezes. She has no rales.  Abdominal: Soft. Bowel sounds are normal. She exhibits no distension and no mass. There is no tenderness. There is no rebound and no guarding.  Musculoskeletal: She exhibits no edema.  Lymphadenopathy:    She has no cervical adenopathy.  Neurological: She is alert and oriented to person, place, and time.  Skin: Skin is warm and  dry. No rash noted. No erythema.  Psychiatric: Mood and affect normal.    LABORATORY DATA:  CBC    Component Value Date/Time   WBC 2.9 (L) 12/07/2017 1346   RBC 3.10 (L) 12/07/2017 1346   HGB 10.6 (L) 12/07/2017 1346   HGB 10.5 (L) 11/02/2017 0936   HCT 31.8 (L) 12/07/2017 1346   HCT 31.4 (L) 11/02/2017 0936   PLT 182 12/07/2017 1346   PLT 181 11/02/2017 0936   MCV 102.6 (H) 12/07/2017 1346   MCV 99.7 11/02/2017 0936   MCH 34.2 (H) 12/07/2017 1346   MCHC 33.3 12/07/2017 1346   RDW 13.4 12/07/2017 1346   RDW 15.4 (H) 11/02/2017 0936   LYMPHSABS 1.3 12/07/2017 1346   LYMPHSABS 1.1 11/02/2017 0936   MONOABS 0.2 12/07/2017 1346   MONOABS 0.2 11/02/2017 0936   EOSABS 0.0 12/07/2017 1346   EOSABS 0.0 11/02/2017 0936   BASOSABS 0.0 12/07/2017 1346   BASOSABS 0.0 11/02/2017 0936    CMP     Component Value Date/Time   NA 142 12/07/2017 1346   NA 142 11/02/2017 0936   K 3.9 12/07/2017 1346   K 3.7 11/02/2017 0936   CL 105 12/07/2017 1346   CO2 28 12/07/2017 1346   CO2 27 11/02/2017 0936   GLUCOSE 85 12/07/2017 1346   GLUCOSE 96 11/02/2017 0936   BUN 15 12/07/2017 1346   BUN 12.5 11/02/2017 0936   CREATININE 0.86 12/07/2017 1346   CREATININE 0.9 11/02/2017 0936   CALCIUM 10.1 12/07/2017 1346   CALCIUM 9.8 11/02/2017 0936   PROT 7.3 12/07/2017 1346   PROT 7.3 11/02/2017 0936   ALBUMIN 4.1 12/07/2017 1346   ALBUMIN 4.2 11/02/2017 0936   AST 23 12/07/2017 1346   AST 17 11/02/2017 0936   ALT 31 12/07/2017 1346   ALT 22 11/02/2017 0936   ALKPHOS 52 12/07/2017 1346   ALKPHOS 51 11/02/2017 0936   BILITOT 0.4 12/07/2017 1346   BILITOT 0.53 11/02/2017 0936   GFRNONAA >60 12/07/2017 1346   GFRAA >60 12/07/2017 1346      ASSESSMENT and PLAN:   Malignant neoplasm of upper-outer quadrant of right breast in female, estrogen receptor negative ([place]) 07/17/2017: Right upper outer quadrant painful palpable right breast mass by ultrasound measured 1 cm with 3 abnormal  axillary lymph nodes 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 Neoadjuvant chemotherapy with Adriamycin and Cytoxan dose dense 4 followed by Taxol weekly 12 2. Followed bybilateral  mastectomies with reconstructionwith sentinel lymph node study + targeted axillary dissection 3. Followed by adjuvant radiation therapy Genetic testing:BRCA2 mutation positive --------------------------------------------------------------------------- Current treatment: Completed 4 cycles ofneoadjuvant dose dense Adriamycin and Cytoxan, today is cycle11 Taxol (Carbo dropped after 7 cycles due to neutropenia)  Chemotherapy toxicities: She is tolerating the Taxol well.  Her ANC today is 1.4.  I reviewed this with her in detail.  She will proceed with the Taxol.  She and I reviewed healthy diet and exercise, particularly in the hopes to start to recondition herself from chemotherapy.  She has her MRI scheduled on 2/15, and I spoke with Bary Castilla, RN to schedule f/u with Dr. Lucia Gaskins for surgical planning.  She is still planning on bilateral mastectomies.    Zariya will return in one week for her final week of Taxol chemotherapy.     All questions were answered. The patient knows to call the clinic with any problems, questions or concerns. We can certainly see the patient much sooner if necessary.  A total of (30) minutes of face-to-face time was spent with this patient with greater than 50% of that time in counseling and care-coordination.  This note was electronically signed. Scot Dock, NP 12/07/2017

## 2017-12-11 ENCOUNTER — Inpatient Hospital Stay (HOSPITAL_COMMUNITY): Admission: RE | Admit: 2017-12-11 | Payer: 59 | Source: Ambulatory Visit

## 2017-12-13 ENCOUNTER — Telehealth: Payer: Self-pay | Admitting: *Deleted

## 2017-12-13 NOTE — Telephone Encounter (Signed)
Called pt to congratulate on completion of chemotherapy on 2/14. Confirmed future appts for MRI, Drs. Lindi Adie and Lucia Gaskins. Denies questions or needs at this time.. Encourage pt to call with concerns. Received verbal understanding.

## 2017-12-14 ENCOUNTER — Inpatient Hospital Stay: Payer: 59

## 2017-12-14 ENCOUNTER — Inpatient Hospital Stay (HOSPITAL_BASED_OUTPATIENT_CLINIC_OR_DEPARTMENT_OTHER): Payer: 59 | Admitting: Hematology and Oncology

## 2017-12-14 DIAGNOSIS — Z171 Estrogen receptor negative status [ER-]: Principal | ICD-10-CM

## 2017-12-14 DIAGNOSIS — G629 Polyneuropathy, unspecified: Secondary | ICD-10-CM | POA: Diagnosis not present

## 2017-12-14 DIAGNOSIS — D701 Agranulocytosis secondary to cancer chemotherapy: Secondary | ICD-10-CM

## 2017-12-14 DIAGNOSIS — Z1509 Genetic susceptibility to other malignant neoplasm: Secondary | ICD-10-CM

## 2017-12-14 DIAGNOSIS — C50411 Malignant neoplasm of upper-outer quadrant of right female breast: Secondary | ICD-10-CM

## 2017-12-14 DIAGNOSIS — Z5111 Encounter for antineoplastic chemotherapy: Secondary | ICD-10-CM | POA: Diagnosis not present

## 2017-12-14 DIAGNOSIS — C773 Secondary and unspecified malignant neoplasm of axilla and upper limb lymph nodes: Secondary | ICD-10-CM

## 2017-12-14 LAB — CBC WITH DIFFERENTIAL/PLATELET
Basophils Absolute: 0 10*3/uL (ref 0.0–0.1)
Basophils Relative: 0 %
EOS ABS: 0 10*3/uL (ref 0.0–0.5)
EOS PCT: 1 %
HCT: 31.4 % — ABNORMAL LOW (ref 34.8–46.6)
Hemoglobin: 10.6 g/dL — ABNORMAL LOW (ref 11.6–15.9)
LYMPHS ABS: 1 10*3/uL (ref 0.9–3.3)
LYMPHS PCT: 40 %
MCH: 34.5 pg — AB (ref 25.1–34.0)
MCHC: 33.8 g/dL (ref 31.5–36.0)
MCV: 102.3 fL — AB (ref 79.5–101.0)
MONO ABS: 0.2 10*3/uL (ref 0.1–0.9)
MONOS PCT: 6 %
Neutro Abs: 1.4 10*3/uL — ABNORMAL LOW (ref 1.5–6.5)
Neutrophils Relative %: 53 %
Platelets: 224 10*3/uL (ref 145–400)
RBC: 3.07 MIL/uL — ABNORMAL LOW (ref 3.70–5.45)
RDW: 13.2 % (ref 11.2–14.5)
WBC: 2.6 10*3/uL — ABNORMAL LOW (ref 3.9–10.3)

## 2017-12-14 LAB — COMPREHENSIVE METABOLIC PANEL
ALBUMIN: 4.1 g/dL (ref 3.5–5.0)
ALT: 39 U/L (ref 0–55)
AST: 25 U/L (ref 5–34)
Alkaline Phosphatase: 49 U/L (ref 40–150)
Anion gap: 11 (ref 3–11)
BUN: 11 mg/dL (ref 7–26)
CHLORIDE: 105 mmol/L (ref 98–109)
CO2: 26 mmol/L (ref 22–29)
CREATININE: 0.9 mg/dL (ref 0.60–1.10)
Calcium: 10.1 mg/dL (ref 8.4–10.4)
GFR calc Af Amer: >60 mL/min (ref >60)
GLUCOSE: 109 mg/dL (ref 70–140)
POTASSIUM: 4 mmol/L (ref 3.5–5.1)
Sodium: 142 mmol/L (ref 136–145)
Total Bilirubin: 0.3 mg/dL (ref 0.2–1.2)
Total Protein: 7.2 g/dL (ref 6.4–8.3)

## 2017-12-14 MED ORDER — DIPHENHYDRAMINE HCL 50 MG/ML IJ SOLN
INTRAMUSCULAR | Status: AC
  Filled 2017-12-14: qty 1

## 2017-12-14 MED ORDER — HEPARIN SOD (PORK) LOCK FLUSH 100 UNIT/ML IV SOLN
500.0000 [IU] | Freq: Once | INTRAVENOUS | Status: AC | PRN
  Administered 2017-12-14: 500 [IU]
  Filled 2017-12-14: qty 5

## 2017-12-14 MED ORDER — SODIUM CHLORIDE 0.9% FLUSH
10.0000 mL | INTRAVENOUS | Status: DC | PRN
  Administered 2017-12-14: 10 mL
  Filled 2017-12-14: qty 10

## 2017-12-14 MED ORDER — FAMOTIDINE IN NACL 20-0.9 MG/50ML-% IV SOLN: 20.0000 mg | Freq: Once | INTRAVENOUS | Status: DC

## 2017-12-14 MED ORDER — DIPHENHYDRAMINE HCL 50 MG/ML IJ SOLN
50.0000 mg | Freq: Once | INTRAMUSCULAR | Status: AC
  Administered 2017-12-14: 50 mg via INTRAVENOUS

## 2017-12-14 MED ORDER — DEXAMETHASONE SODIUM PHOSPHATE 100 MG/10ML IJ SOLN
20.0000 mg | Freq: Once | INTRAMUSCULAR | Status: AC
  Administered 2017-12-14: 20 mg via INTRAVENOUS
  Filled 2017-12-14: qty 2

## 2017-12-14 MED ORDER — PACLITAXEL CHEMO INJECTION 300 MG/50ML
45.0000 mg/m2 | Freq: Once | INTRAVENOUS | Status: AC
  Administered 2017-12-14: 84 mg via INTRAVENOUS
  Filled 2017-12-14: qty 14

## 2017-12-14 MED ORDER — SODIUM CHLORIDE 0.9 % IV SOLN
Freq: Once | INTRAVENOUS | Status: AC
  Administered 2017-12-14: 15:00:00 via INTRAVENOUS

## 2017-12-14 MED ORDER — SODIUM CHLORIDE 0.9 % IV SOLN
20.0000 mg | Freq: Once | INTRAVENOUS | Status: AC
  Administered 2017-12-14: 20 mg via INTRAVENOUS
  Filled 2017-12-14: qty 2

## 2017-12-14 MED ORDER — SODIUM CHLORIDE 0.9% FLUSH
10.0000 mL | INTRAVENOUS | Status: DC | PRN
  Administered 2017-12-14: 10 mL via INTRAVENOUS
  Filled 2017-12-14: qty 10

## 2017-12-14 NOTE — Patient Instructions (Signed)
[facility] Discharge Instructions for Patients Receiving Chemotherapy  Today you received the following chemotherapy agents Paclitaxel  To help prevent nausea and vomiting after your treatment, we encourage you to take your nausea medication as directed   If you develop nausea and vomiting that is not controlled by your nausea medication, call the clinic.   BELOW ARE SYMPTOMS THAT SHOULD BE REPORTED IMMEDIATELY:  *FEVER GREATER THAN 100.5 F  *CHILLS WITH OR WITHOUT FEVER  NAUSEA AND VOMITING THAT IS NOT CONTROLLED WITH YOUR NAUSEA MEDICATION  *UNUSUAL SHORTNESS OF BREATH  *UNUSUAL BRUISING OR BLEEDING  TENDERNESS IN MOUTH AND THROAT WITH OR WITHOUT PRESENCE OF ULCERS  *URINARY PROBLEMS  *BOWEL PROBLEMS  UNUSUAL RASH Items with * indicate a potential emergency and should be followed up as soon as possible.  Feel free to call the clinic should you have any questions or concerns. The clinic phone number is [phone].  Please show the CHEMO ALERT CARD at check-in to the Emergency Department and triage nurse.   

## 2017-12-14 NOTE — Progress Notes (Signed)
Patient Care Team: Cari Caraway, MD as PCP - General (Family Medicine) Christophe Louis, MD as Consulting Physician (Obstetrics and Gynecology) Alphonsa Overall, MD as Consulting Physician (General Surgery) Nicholas Lose, MD as Consulting Physician (Hematology and Oncology) Eppie Gibson, MD as Attending Physician (Radiation Oncology)  DIAGNOSIS:  Encounter Diagnosis  Name Primary?  . 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  The Common Hereditary Cancers Panel was ordered.  The Hereditary Gene Panel offered by [facility] includes sequencing and/or deletion duplication testing of the following 46 genes: APC, ATM, AXIN2, BARD1, BMPR1A, BRCA1, BRCA2, BRIP1, CDH1, CDKN2A (p14ARF), CDKN2A (p16INK4a), CHEK2, CTNNA1, DICER1, EPCAM (Deletion/duplication testing only), GREM1 (promoter region deletion/duplication testing only), KIT, MEN1, MLH1, MSH2, MSH3, MSH6, MUTYH, NBN, NF1, NHTL1, PALB2, PDGFRA, PMS2, POLD1, POLE, PTEN, RAD50, RAD51C, RAD51D, SDHB, SDHC, SDHD, SMAD4, SMARCA4. STK11, TP53, TSC1, TSC2, and VHL.  The following genes were evaluated for sequence changes only: SDHA and HOXB13 c.251G>A variant only.        Results: POSITIVE for a mutation in BRCA2 c.4619_4623delACAAA (p.Asp1540Glyfs*6).  The date of this test report is 08/18/2017.       CHIEF COMPLIANT: cycle 12 Taxol  INTERVAL HISTORY: Kara Crawford is a 52 year old with above-mentioned history of right breast cancer who is currently neoadjuvant chemotherapy and today's cycle 12 of Taxol.  She appears to be tolerating Taxol extremely well.  Today's her last chemotherapy treatment she is very excited about it but also anxious and nervous about what will be found on the follow-up breast MRI.  She has very minimal neuropathy in her hands and feet.  She denies any nausea vomiting.  REVIEW OF SYSTEMS:   Constitutional: Denies fevers, chills or abnormal weight loss Eyes: Denies blurriness of vision Ears, nose, mouth, throat, and face: Denies mucositis or sore throat Respiratory: Denies cough, dyspnea or wheezes Cardiovascular: Denies palpitation, chest discomfort Gastrointestinal:  Denies nausea, heartburn or change in bowel habits Skin: Denies abnormal skin rashes Lymphatics: Denies new lymphadenopathy or easy bruising Neurological:Denies numbness, tingling or new weaknesses Behavioral/Psych: Mood is stable, no new changes  Extremities: No lower extremity edema Breast:  denies any pain or lumps or nodules in either breasts All other systems were reviewed with the patient and are negative.  I have reviewed the past medical history, past surgical history, social history and family history with the patient and they are unchanged from previous note.  ALLERGIES:  has No Known Allergies.  MEDICATIONS:  Current Outpatient Medications  Medication Sig Dispense Refill  . acetaminophen (TYLENOL) 325 MG tablet Take 650 mg by mouth as needed.    Marland Kitchen amLODipine (NORVASC) 10 MG tablet Take 10 mg by mouth every morning.     Marland Kitchen  atorvastatin (LIPITOR) 80 MG tablet Take 80 mg by mouth every morning.     . Calcium Carbonate-Vitamin D (CALCIUM 500/D PO) Take  2 tablets by mouth daily.    . Cholecalciferol (VITAMIN D3 PO) Take by mouth.    . Cyanocobalamin (VITAMIN B-12 PO) Take 1 tablet by mouth every morning.    . lidocaine-prilocaine (EMLA) cream Apply to affected area once 30 g 3  . lisinopril-hydrochlorothiazide (PRINZIDE,ZESTORETIC) 20-12.5 MG per tablet Take 2 tablets by mouth daily.     Marland Kitchen LORazepam (ATIVAN) 0.5 MG tablet Take 1 tablet (0.5 mg total) by mouth every 6 (six) hours as needed (Nausea or vomiting). 30 tablet 0  . metoprolol succinate (TOPROL-XL) 25 MG 24 hr tablet Take 25 mg by mouth every morning.     . ondansetron (ZOFRAN) 8 MG tablet Take 1 tablet (8 mg total) by mouth 2 (two) times daily as needed. Start on the third day after chemotherapy. 30 tablet 1  . pantoprazole (PROTONIX) 40 MG tablet Take 40 mg by mouth daily.    . prochlorperazine (COMPAZINE) 10 MG tablet TAKE 1 TABLET(10 MG) BY MOUTH EVERY 6 HOURS AS NEEDED FOR NAUSEA OR VOMITING 385 tablet 1  . sitaGLIPtin-metformin (JANUMET) 50-500 MG per tablet Take 1 tablet by mouth 2 (two) times daily with a meal.      No current facility-administered medications for this visit.    Facility-Administered Medications Ordered in Other Visits  Medication Dose Route Frequency Provider Last Rate Last Dose  . heparin lock flush 100 unit/mL  500 Units Intracatheter Once PRN Nicholas Lose, MD      . PACLitaxel (TAXOL) 84 mg in sodium chloride 0.9 % 250 mL chemo infusion (</= '80mg'$ /m2)  45 mg/m2 (Treatment Plan Recorded) Intravenous Once Nicholas Lose, MD      . sodium chloride flush (NS) 0.9 % injection 10 mL  10 mL Intravenous PRN Nicholas Lose, MD   10 mL at 08/03/17 1516  . sodium chloride flush (NS) 0.9 % injection 10 mL  10 mL Intravenous PRN Nicholas Lose, MD   10 mL at 08/17/17 1531  . sodium chloride flush (NS) 0.9 % injection 10 mL  10 mL Intravenous PRN Nicholas Lose, MD   10 mL at 10/26/17 1014  . sodium chloride flush (NS) 0.9 % injection 10 mL  10 mL Intracatheter PRN Nicholas Lose, MD        PHYSICAL EXAMINATION: ECOG PERFORMANCE STATUS: 1 - Symptomatic but completely ambulatory  Vitals:   12/14/17 1433  BP: 117/82  Pulse: 80  Resp: 18  Temp: 98.4 F (36.9 C)  SpO2: 99%   Filed Weights   12/14/17 1433  Weight: 183 lb 1.6 oz (83.1 kg)    GENERAL:alert, no distress and comfortable SKIN: skin color, texture, turgor are normal, no rashes or significant lesions EYES: normal, Conjunctiva are pink and non-injected, sclera clear OROPHARYNX:no exudate, no erythema and lips, buccal mucosa, and tongue normal  NECK: supple, thyroid normal size, non-tender, without nodularity LYMPH:  no palpable lymphadenopathy in the cervical, axillary or inguinal LUNGS: clear to auscultation and percussion with normal breathing effort HEART: regular rate & rhythm and no murmurs and no lower extremity edema ABDOMEN:abdomen soft, non-tender and normal bowel sounds MUSCULOSKELETAL:no cyanosis of digits and no clubbing  NEURO: alert & oriented x 3 with fluent speech, no focal motor/sensory deficits EXTREMITIES: No lower extremity edema BREAST: No palpable masses or nodules in either right or left breasts. No palpable axillary supraclavicular or infraclavicular adenopathy  no breast tenderness or nipple discharge. (exam performed in the presence of a chaperone)  LABORATORY DATA:  I have reviewed the data as listed CMP Latest Ref Rng & Units 12/14/2017 12/07/2017 11/30/2017  Glucose 70 - 140 mg/dL 109 85 83  BUN 7 - 26 mg/dL '11 15 9  '$ Creatinine 0.60 - 1.10 mg/dL 0.90 0.86 0.80  Sodium 136 - 145 mmol/L 142 142 140  Potassium 3.5 - 5.1 mmol/L 4.0 3.9 3.7  Chloride 98 - 109 mmol/L 105 105 103  CO2 22 - 29 mmol/L '26 28 28  '$ Calcium 8.4 - 10.4 mg/dL 10.1 10.1 9.8  Total Protein 6.4 - 8.3 g/dL 7.2 7.3 7.2  Total Bilirubin 0.2 - 1.2 mg/dL 0.3 0.4 0.4  Alkaline Phos 40 - 150 U/L 49 52 51  AST 5 - 34 U/L '25 23 20  '$ ALT 0 - 55 U/L 39 31 26    Lab Results  Component Value Date   WBC  2.6 (L) 12/14/2017   HGB 10.6 (L) 12/14/2017   HCT 31.4 (L) 12/14/2017   MCV 102.3 (H) 12/14/2017   PLT 224 12/14/2017   NEUTROABS 1.4 (L) 12/14/2017    ASSESSMENT & PLAN:  Malignant neoplasm of upper-outer quadrant of right breast in female, estrogen receptor negative ([place]) 07/17/2017: Right upper outer quadrant painful palpable right breast mass by ultrasound measured 1 cm with 3 abnormal axillary lymph nodes 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 Neoadjuvant chemotherapy with Adriamycin and Cytoxan dose dense 4 followed by Taxol weekly 12 2. Followed bybilateral mastectomies with reconstructionwith sentinel lymph node study + targeted axillary dissection 3. Followed by adjuvant radiation therapy Genetic testing:BRCA2 mutation positive --------------------------------------------------------------------------- Current treatment: Completed 4 cycles ofneoadjuvant dose dense Adriamycin and Cytoxan, today is cycle11 Taxol (Carbo dropped after 7 cycles due to neutropenia)  Chemo toxicities: 1.  Neutropenia: Monitoring closely we had to discontinue carboplatin 2. neuropathy grade 1   Breast MRI has been scheduled. Follow-up appointment with Dr. Lucia Gaskins has also been scheduled.  Patient wishes for bilateral mastectomies. Return to clinic after the breast MRI to discuss the results.   I spent 25 minutes talking to the patient of which more than half was spent in counseling and coordination of care.  No orders of the defined types were placed in this encounter.  The patient has a good understanding of the overall plan. she agrees with it. she will call with any problems that may develop before the next visit here.   Harriette Ohara, MD 12/14/17

## 2017-12-14 NOTE — Assessment & Plan Note (Signed)
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 Neoadjuvant chemotherapy with Adriamycin and Cytoxan dose dense 4 followed by Taxol weekly 12 2. Followed bybilateral mastectomies with reconstructionwith sentinel lymph node study + targeted axillary dissection 3. Followed by adjuvant radiation therapy Genetic testing:BRCA2 mutation positive --------------------------------------------------------------------------- Current treatment: Completed 4 cycles ofneoadjuvant dose dense Adriamycin and Cytoxan, today is cycle11 Taxol (Carbo dropped after 7 cycles due to neutropenia)  Chemo toxicities: 1.  Neutropenia: Monitoring closely we had to discontinue carboplatin  Breast MRI has been scheduled. Follow-up appointment with Dr. Lucia Gaskins has also been scheduled.  Patient wishes for bilateral mastectomies. Return to clinic 1 week after her surgery.

## 2017-12-14 NOTE — Patient Instructions (Signed)

## 2017-12-15 ENCOUNTER — Other Ambulatory Visit: Payer: Self-pay | Admitting: Hematology and Oncology

## 2017-12-15 ENCOUNTER — Ambulatory Visit (HOSPITAL_COMMUNITY)
Admission: RE | Admit: 2017-12-15 | Discharge: 2017-12-15 | Disposition: A | Discharge status: Home / Self Care | Payer: 59 | Source: Ambulatory Visit | Attending: Hematology and Oncology | Admitting: Hematology and Oncology

## 2017-12-15 ENCOUNTER — Other Ambulatory Visit (HOSPITAL_COMMUNITY): Payer: 59

## 2017-12-15 DIAGNOSIS — C50919 Malignant neoplasm of unspecified site of unspecified female breast: Secondary | ICD-10-CM | POA: Diagnosis not present

## 2017-12-15 DIAGNOSIS — C50411 Malignant neoplasm of upper-outer quadrant of right female breast: Secondary | ICD-10-CM

## 2017-12-15 DIAGNOSIS — N6489 Other specified disorders of breast: Secondary | ICD-10-CM | POA: Diagnosis not present

## 2017-12-15 DIAGNOSIS — Z9889 Other specified postprocedural states: Secondary | ICD-10-CM | POA: Insufficient documentation

## 2017-12-15 DIAGNOSIS — Z171 Estrogen receptor negative status [ER-]: Principal | ICD-10-CM

## 2017-12-15 MED ORDER — GADOBENATE DIMEGLUMINE 529 MG/ML IV SOLN
20.0000 mL | Freq: Once | INTRAVENOUS | Status: AC | PRN
  Administered 2017-12-15: 17 mL via INTRAVENOUS

## 2017-12-18 ENCOUNTER — Telehealth (HOSPITAL_COMMUNITY): Payer: Self-pay | Admitting: Vascular Surgery

## 2017-12-18 NOTE — Telephone Encounter (Signed)
Left pt message to make 6 month f/u appt w/ echo w/ DB

## 2017-12-20 ENCOUNTER — Inpatient Hospital Stay (HOSPITAL_BASED_OUTPATIENT_CLINIC_OR_DEPARTMENT_OTHER): Payer: 59 | Admitting: Hematology and Oncology

## 2017-12-20 DIAGNOSIS — C50411 Malignant neoplasm of upper-outer quadrant of right female breast: Secondary | ICD-10-CM

## 2017-12-20 DIAGNOSIS — Z1509 Genetic susceptibility to other malignant neoplasm: Secondary | ICD-10-CM | POA: Diagnosis not present

## 2017-12-20 DIAGNOSIS — Z171 Estrogen receptor negative status [ER-]: Secondary | ICD-10-CM | POA: Diagnosis not present

## 2017-12-20 DIAGNOSIS — G629 Polyneuropathy, unspecified: Secondary | ICD-10-CM | POA: Diagnosis not present

## 2017-12-20 DIAGNOSIS — D701 Agranulocytosis secondary to cancer chemotherapy: Secondary | ICD-10-CM | POA: Diagnosis not present

## 2017-12-20 DIAGNOSIS — Z5111 Encounter for antineoplastic chemotherapy: Secondary | ICD-10-CM | POA: Diagnosis not present

## 2017-12-20 DIAGNOSIS — C773 Secondary and unspecified malignant neoplasm of axilla and upper limb lymph nodes: Secondary | ICD-10-CM | POA: Diagnosis not present

## 2017-12-20 NOTE — Progress Notes (Signed)
Patient Care Team: Cari Caraway, MD as PCP - General (Family Medicine) Christophe Louis, MD as Consulting Physician (Obstetrics and Gynecology) Alphonsa Overall, MD as Consulting Physician (General Surgery) Nicholas Lose, MD as Consulting Physician (Hematology and Oncology) Eppie Gibson, MD as Attending Physician (Radiation Oncology)  DIAGNOSIS:  Encounter Diagnosis  Name Primary?  . Malignant neoplasm of upper-outer quadrant of right breast in female, estrogen receptor negative ([place])     SUMMARY OF ONCOLOGIC HISTORY:   Malignant neoplasm of upper-outer quadrant of right breast in female, estrogen receptor negative (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  The Common Hereditary Cancers Panel was ordered.  The Hereditary Gene Panel offered by [facility] includes sequencing and/or deletion duplication testing of the following 46 genes: APC, ATM, AXIN2, BARD1, BMPR1A, BRCA1, BRCA2, BRIP1, CDH1, CDKN2A (p14ARF), CDKN2A (p16INK4a), CHEK2, CTNNA1, DICER1, EPCAM (Deletion/duplication testing only), GREM1 (promoter region deletion/duplication testing only), KIT, MEN1, MLH1, MSH2, MSH3, MSH6, MUTYH, NBN, NF1, NHTL1, PALB2, PDGFRA, PMS2, POLD1, POLE, PTEN, RAD50, RAD51C, RAD51D, SDHB, SDHC, SDHD, SMAD4, SMARCA4. STK11, TP53, TSC1, TSC2, and VHL.  The following genes were evaluated for sequence changes only: SDHA and HOXB13 c.251G>A variant only.        Results: POSITIVE for a mutation in BRCA2 c.[id number] (p.Asp1540Glyfs*6).  The date of this test report is 08/18/2017.      12/15/2017 Breast MRI    Radiologic complete response       CHIEF COMPLIANT: Follow-up after recent breast MRI after neoadjuvant chemo  INTERVAL HISTORY: Kara Crawford is a 52 year old with above-mentioned history of triple negative right breast cancer underwent neoadjuvant chemotherapy and underwent a breast MRI and is here today to discuss the results with her husband.  She is recovering very well from the prior chemotherapy.  She did have neuropathy and because of that we had to reduce the dosage of chemotherapy and she was able to finish all 12 cycles of Taxol.  She received a few doses of carboplatin but that had to be discontinued because of neutropenia issues.  REVIEW OF SYSTEMS:   Constitutional: Denies fevers, chills or abnormal weight loss Eyes: Denies blurriness of vision Ears, nose, mouth, throat, and face: Denies mucositis or sore throat Respiratory: Denies cough, dyspnea or wheezes Cardiovascular: Denies palpitation, chest discomfort Gastrointestinal:  Denies nausea, heartburn or change in bowel habits Skin: Denies abnormal skin rashes Lymphatics: Denies new lymphadenopathy or easy bruising Neurological: Peripheral neuropathy Behavioral/Psych: Mood is stable, no new changes  Extremities: No lower extremity edema All other systems were reviewed with the patient and are negative.  I have reviewed the past medical history, past surgical history, social history and family history with the patient and they are unchanged from previous note.  ALLERGIES:  has No Known Allergies.  MEDICATIONS:  Current Outpatient Medications  Medication Sig Dispense Refill  . acetaminophen (TYLENOL) 325 MG tablet Take 650 mg by mouth as needed.    Marland Kitchen  amLODipine (NORVASC) 10 MG tablet Take 10 mg by mouth every morning.     Marland Kitchen atorvastatin (LIPITOR) 80 MG tablet  Take 80 mg by mouth every morning.     . Calcium Carbonate-Vitamin D (CALCIUM 500/D PO) Take 2 tablets by mouth daily.    . Cholecalciferol (VITAMIN D3 PO) Take by mouth.    . Cyanocobalamin (VITAMIN B-12 PO) Take 1 tablet by mouth every morning.    . lidocaine-prilocaine (EMLA) cream Apply to affected area once 30 g 3  . lisinopril-hydrochlorothiazide (PRINZIDE,ZESTORETIC) 20-12.5 MG per tablet Take 2 tablets by mouth daily.     Marland Kitchen LORazepam (ATIVAN) 0.5 MG tablet Take 1 tablet (0.5 mg total) by mouth every 6 (six) hours as needed (Nausea or vomiting). 30 tablet 0  . metoprolol succinate (TOPROL-XL) 25 MG 24 hr tablet Take 25 mg by mouth every morning.     . ondansetron (ZOFRAN) 8 MG tablet Take 1 tablet (8 mg total) by mouth 2 (two) times daily as needed. Start on the third day after chemotherapy. 30 tablet 1  . pantoprazole (PROTONIX) 40 MG tablet Take 40 mg by mouth daily.    . prochlorperazine (COMPAZINE) 10 MG tablet TAKE 1 TABLET(10 MG) BY MOUTH EVERY 6 HOURS AS NEEDED FOR NAUSEA OR VOMITING 385 tablet 1  . sitaGLIPtin-metformin (JANUMET) 50-500 MG per tablet Take 1 tablet by mouth 2 (two) times daily with a meal.      No current facility-administered medications for this visit.    Facility-Administered Medications Ordered in Other Visits  Medication Dose Route Frequency Provider Last Rate Last Dose  . sodium chloride flush (NS) 0.9 % injection 10 mL  10 mL Intravenous PRN Nicholas Lose, MD   10 mL at 08/03/17 1516  . sodium chloride flush (NS) 0.9 % injection 10 mL  10 mL Intravenous PRN Nicholas Lose, MD   10 mL at 08/17/17 1531  . sodium chloride flush (NS) 0.9 % injection 10 mL  10 mL Intravenous PRN Nicholas Lose, MD   10 mL at 10/26/17 1014    PHYSICAL EXAMINATION: ECOG PERFORMANCE STATUS: 1 - Symptomatic but completely ambulatory  Vitals:   12/20/17 0931  BP: (!) 147/82  Pulse: 89  Resp: 20  Temp: 98.8 F (37.1 C)  SpO2: 100%   Filed Weights   12/20/17 0931  Weight:  183 lb 12.8 oz (83.4 kg)    GENERAL:alert, no distress and comfortable SKIN: skin color, texture, turgor are normal, no rashes or significant lesions EYES: normal, Conjunctiva are pink and non-injected, sclera clear OROPHARYNX:no exudate, no erythema and lips, buccal mucosa, and tongue normal  NECK: supple, thyroid normal size, non-tender, without nodularity LYMPH:  no palpable lymphadenopathy in the cervical, axillary or inguinal LUNGS: clear to auscultation and percussion with normal breathing effort HEART: regular rate & rhythm and no murmurs and no lower extremity edema ABDOMEN:abdomen soft, non-tender and normal bowel sounds MUSCULOSKELETAL:no cyanosis of digits and no clubbing  NEURO: alert & oriented x 3 with fluent speech, grade 1 peripheral neuropathy EXTREMITIES: No lower extremity edema  LABORATORY DATA:  I have reviewed the data as listed CMP Latest Ref Rng & Units 12/14/2017 12/07/2017 11/30/2017  Glucose 70 - 140 mg/dL 109 85 83  BUN 7 - 26 mg/dL '11 15 9  '$ Creatinine 0.60 - 1.10 mg/dL 0.90 0.86 0.80  Sodium 136 - 145 mmol/L 142 142 140  Potassium 3.5 - 5.1 mmol/L 4.0 3.9 3.7  Chloride 98 - 109 mmol/L 105 105 103  CO2  22 - 29 mmol/L '26 28 28  '$ Calcium 8.4 - 10.4 mg/dL 10.1 10.1 9.8  Total Protein 6.4 - 8.3 g/dL 7.2 7.3 7.2  Total Bilirubin 0.2 - 1.2 mg/dL 0.3 0.4 0.4  Alkaline Phos 40 - 150 U/L 49 52 51  AST 5 - 34 U/L '25 23 20  '$ ALT 0 - 55 U/L 39 31 26    Lab Results  Component Value Date   WBC 2.6 (L) 12/14/2017   HGB 10.6 (L) 12/14/2017   HCT 31.4 (L) 12/14/2017   MCV 102.3 (H) 12/14/2017   PLT 224 12/14/2017   NEUTROABS 1.4 (L) 12/14/2017    ASSESSMENT & PLAN:  Malignant neoplasm of upper-outer quadrant of right breast in female, estrogen receptor negative ([place]) 07/17/2017: Right upper outer quadrant painful palpable right breast mass by ultrasound measured 1 cm with 3 abnormal axillary lymph nodes biopsy invasive ductal carcinoma grade 3 with DCIS triple  negative disease, Ki-67 90%, lymph node biopsy also positive, T1bN1 stage IIB AJCC 8  Treatment plan: 1. Neoadjuvant chemotherapy with Adriamycin and Cytoxan dose dense 4 followed by Taxol weekly 12 2. Followed bybilateral mastectomies with reconstructionwith sentinel lymph node study + targeted axillary dissection 3. Followed by adjuvant radiation therapy Genetic testing:BRCA2 mutation positive --------------------------------------------------------------------------- Breast MRI 12/15/2017: Radiologic complete response Patient was ecstatic to hear the results. She will be meeting with Dr. Lucia Gaskins this Friday to discuss bilateral mastectomies with reconstruction. She is also met with her gynecologist to talk about oophorectomy.  She will discussed with Dr. Lucia Gaskins if both the surgeries can be done at the same time.  I will see the patient back after surgery. Her port can be removed during surgery   I spent 25 minutes talking to the patient of which more than half was spent in counseling and coordination of care.  No orders of the defined types were placed in this encounter.  The patient has a good understanding of the overall plan. she agrees with it. she will call with any problems that may develop before the next visit here.   Harriette Ohara, MD 12/20/17

## 2017-12-20 NOTE — Assessment & Plan Note (Signed)
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 Neoadjuvant chemotherapy with Adriamycin and Cytoxan dose dense 4 followed by Taxol weekly 12 2. Followed bybilateral mastectomies with reconstructionwith sentinel lymph node study + targeted axillary dissection 3. Followed by adjuvant radiation therapy Genetic testing:BRCA2 mutation positive --------------------------------------------------------------------------- Breast MRI 12/15/2017: Radiologic complete response Patient was ecstatic to hear the results. She will be meeting with Dr. Lucia Gaskins this Friday to discuss bilateral mastectomies with reconstruction. She is also met with her gynecologist to talk about oophorectomy.  She will discussed with Dr. Lucia Gaskins if both the surgeries can be done at the same time.  I will see the patient back after surgery. Her port can be removed during surgery

## 2017-12-21 DIAGNOSIS — E559 Vitamin D deficiency, unspecified: Secondary | ICD-10-CM | POA: Diagnosis not present

## 2017-12-21 DIAGNOSIS — Z7984 Long term (current) use of oral hypoglycemic drugs: Secondary | ICD-10-CM | POA: Diagnosis not present

## 2017-12-21 DIAGNOSIS — E1165 Type 2 diabetes mellitus with hyperglycemia: Secondary | ICD-10-CM | POA: Diagnosis not present

## 2017-12-21 DIAGNOSIS — E782 Mixed hyperlipidemia: Secondary | ICD-10-CM | POA: Diagnosis not present

## 2017-12-22 DIAGNOSIS — C50911 Malignant neoplasm of unspecified site of right female breast: Secondary | ICD-10-CM | POA: Diagnosis not present

## 2017-12-25 DIAGNOSIS — C50911 Malignant neoplasm of unspecified site of right female breast: Secondary | ICD-10-CM | POA: Diagnosis not present

## 2017-12-25 DIAGNOSIS — Z0000 Encounter for general adult medical examination without abnormal findings: Secondary | ICD-10-CM | POA: Diagnosis not present

## 2017-12-25 DIAGNOSIS — I10 Essential (primary) hypertension: Secondary | ICD-10-CM | POA: Diagnosis not present

## 2017-12-25 DIAGNOSIS — E1165 Type 2 diabetes mellitus with hyperglycemia: Secondary | ICD-10-CM | POA: Diagnosis not present

## 2017-12-28 DIAGNOSIS — C50911 Malignant neoplasm of unspecified site of right female breast: Secondary | ICD-10-CM | POA: Diagnosis not present

## 2018-01-01 ENCOUNTER — Encounter: Payer: Self-pay | Admitting: *Deleted

## 2018-01-03 ENCOUNTER — Ambulatory Visit: Payer: Self-pay | Admitting: Surgery

## 2018-01-03 DIAGNOSIS — C50911 Malignant neoplasm of unspecified site of right female breast: Secondary | ICD-10-CM

## 2018-01-17 NOTE — Pre-Procedure Instructions (Addendum)
Alyce Inscore  01/17/2018      [facility] [place] [address] [place] - [address] AT [address] [place] [address] Phone: [phone] Fax: [phone]    Your procedure is scheduled on January 22, 2018.  Report to [facility] Admitting at 9:30 AM.  Call this number if you have problems the morning of surgery:  [phone]   Remember:  Do not eat food or drink liquids after midnight.  Take these medicines the morning of surgery with A SIP OF WATER metoprolol (Toprol XL), acetaminophen (tylenol), amlodipine (norvasc), pantoprazole (protonix), eye drops-if needed.  Please finish drinking you Ensure Pre-surgery drink before leaving your house the morning of surgery.  7 days prior to surgery STOP taking any Aspirin (unless otherwise instructed by your surgeon), Aleve, Naproxen, Ibuprofen, Motrin, Advil, Goody's, BC's, all herbal medications, fish oil, and all vitamins  Continue all other medications as instructed by your physician except follow the above medication instructions before surgery  WHAT DO I DO ABOUT MY DIABETES MEDICATION?  Marland Kitchen Do not take oral diabetes medicines (pills) the morning of surgery:sitagliptin-metformin (janumet).   How to Manage Your Diabetes Before and After Surgery  Why is it important to control my blood sugar before and after surgery? . Improving blood sugar levels before and after surgery helps healing and can limit problems. . A way of improving blood sugar control is eating a healthy diet by: o  Eating less sugar and carbohydrates o  Increasing activity/exercise o  Talking with your doctor about reaching your blood sugar goals . High blood sugars (greater than 180 mg/dL) can raise your risk of infections and slow your recovery, so you will need to focus on controlling your diabetes during the weeks before surgery. . Make sure that the doctor who takes care of your diabetes knows about your  planned surgery including the date and location.  How do I manage my blood sugar before surgery? . Check your blood sugar at least 4 times a day, starting 2 days before surgery, to make sure that the level is not too high or low. o Check your blood sugar the morning of your surgery when you wake up and every 2 hours until you get to the Short Stay unit. . If your blood sugar is less than 70 mg/dL, you will need to treat for low blood sugar: o Do not take insulin. o Treat a low blood sugar (less than 70 mg/dL) with  cup of clear juice (cranberry or apple), 4 glucose tablets, OR glucose gel. Recheck blood sugar in 15 minutes after treatment (to make sure it is greater than 70 mg/dL). If your blood sugar is not greater than 70 mg/dL on recheck, call [phone] o  for further instructions. . Report your blood sugar to the short stay nurse when you get to Short Stay.  . If you are admitted to the hospital after surgery: o Your blood sugar will be checked by the staff and you will probably be given insulin after surgery (instead of oral diabetes medicines) to make sure you have good blood sugar levels. o The goal for blood sugar control after surgery is 80-180 mg/dL.  Reviewed and Endorsed by [facility] Patient Education Committee, August 2015   Do not wear jewelry, make-up or nail polish.  Do not wear lotions, powders, or perfumes, or deodorant.  Do not shave 48 hours prior to surgery.  Do not bring valuables to the hospital.  [facility] is not responsible for any belongings or valuables.  Contacts, dentures or bridgework may not be worn into surgery.  Leave your suitcase in the car.  After surgery it may be brought to your room.  For patients admitted to the hospital, discharge time will be determined by your treatment team.  Patients discharged the day of surgery will not be allowed to drive home.   [facility]- Preparing For Surgery  Before surgery, you can play an important  role. Because skin is not sterile, your skin needs to be as free of germs as possible. You can reduce the number of germs on your skin by washing with CHG (chlorahexidine gluconate) Soap before surgery.  CHG is an antiseptic cleaner which kills germs and bonds with the skin to continue killing germs even after washing.  Please do not use if you have an allergy to CHG or antibacterial soaps. If your skin becomes reddened/irritated stop using the CHG.  Do not shave (including legs and underarms) for at least 48 hours prior to first CHG shower. It is OK to shave your face.  Please follow these instructions carefully.   1. Shower the NIGHT BEFORE SURGERY and the MORNING OF SURGERY with CHG.   2. If you chose to wash your hair, wash your hair first as usual with your normal shampoo.  3. After you shampoo, rinse your hair and body thoroughly to remove the shampoo.  4. Use CHG as you would any other liquid soap. You can apply CHG directly to the skin and wash gently with a scrungie or a clean washcloth.   5. Apply the CHG Soap to your body ONLY FROM THE NECK DOWN.  Do not use on open wounds or open sores. Avoid contact with your eyes, ears, mouth and genitals (private parts). Wash Face and genitals (private parts)  with your normal soap.  6. Wash thoroughly, paying special attention to the area where your surgery will be performed.  7. Thoroughly rinse your body with warm water from the neck down.  8. DO NOT shower/wash with your normal soap after using and rinsing off the CHG Soap.  9. Pat yourself dry with a CLEAN TOWEL.  10. Wear CLEAN PAJAMAS to bed the night before surgery, wear comfortable clothes the morning of surgery  11. Place CLEAN SHEETS on your bed the night of your first shower and DO NOT SLEEP WITH PETS.  Day of Surgery: Do not apply any deodorants/lotions. Please wear clean clothes to the hospital/surgery center.    Please read over the following fact sheets that you were  given. Pain Booklet, Coughing and Deep Breathing and Surgical Site Infection Prevention

## 2018-01-17 NOTE — Progress Notes (Addendum)
PCP: Mervyn Gay, MD  Cardiologist: Glori Bickers, MD in the past for clearance for chemotherapy  EKG: 07/31/17 in EP[facility]  Stress test: pt denies ever  ECHO: 07/27/17, 10/25/17 in EP[facility]  Cardiac Cath: pt denies ever  Chest x-ray:1-view 08/02/17 in [facility]

## 2018-01-18 ENCOUNTER — Encounter (HOSPITAL_COMMUNITY)
Admission: RE | Admit: 2018-01-18 | Discharge: 2018-01-18 | Disposition: A | Discharge status: Home / Self Care | Payer: 59 | Source: Ambulatory Visit | Attending: Surgery | Admitting: Surgery

## 2018-01-18 ENCOUNTER — Encounter (HOSPITAL_COMMUNITY): Payer: Self-pay

## 2018-01-18 ENCOUNTER — Telehealth: Payer: Self-pay | Admitting: Hematology and Oncology

## 2018-01-18 ENCOUNTER — Other Ambulatory Visit: Payer: Self-pay

## 2018-01-18 DIAGNOSIS — D649 Anemia, unspecified: Secondary | ICD-10-CM | POA: Insufficient documentation

## 2018-01-18 DIAGNOSIS — I10 Essential (primary) hypertension: Secondary | ICD-10-CM | POA: Insufficient documentation

## 2018-01-18 DIAGNOSIS — C50911 Malignant neoplasm of unspecified site of right female breast: Secondary | ICD-10-CM | POA: Diagnosis not present

## 2018-01-18 DIAGNOSIS — Z79899 Other long term (current) drug therapy: Secondary | ICD-10-CM | POA: Insufficient documentation

## 2018-01-18 DIAGNOSIS — E669 Obesity, unspecified: Secondary | ICD-10-CM | POA: Diagnosis not present

## 2018-01-18 DIAGNOSIS — E119 Type 2 diabetes mellitus without complications: Secondary | ICD-10-CM | POA: Insufficient documentation

## 2018-01-18 DIAGNOSIS — Z01812 Encounter for preprocedural laboratory examination: Secondary | ICD-10-CM | POA: Insufficient documentation

## 2018-01-18 DIAGNOSIS — K219 Gastro-esophageal reflux disease without esophagitis: Secondary | ICD-10-CM | POA: Insufficient documentation

## 2018-01-18 DIAGNOSIS — Z7984 Long term (current) use of oral hypoglycemic drugs: Secondary | ICD-10-CM | POA: Diagnosis not present

## 2018-01-18 LAB — BASIC METABOLIC PANEL
Anion gap: 9 (ref 5–15)
BUN: 15 mg/dL (ref 6–20)
CALCIUM: 9.6 mg/dL (ref 8.9–10.3)
CO2: 26 mmol/L (ref 22–32)
CREATININE: 0.81 mg/dL (ref 0.44–1.00)
Chloride: 104 mmol/L (ref 101–111)
GFR calc Af Amer: >60 mL/min (ref >60)
GFR calc non Af Amer: >60 mL/min (ref >60)
GLUCOSE: 104 mg/dL — AB (ref 65–99)
Potassium: 3.7 mmol/L (ref 3.5–5.1)
SODIUM: 139 mmol/L (ref 135–145)

## 2018-01-18 LAB — CBC
HCT: 33.9 % — ABNORMAL LOW (ref 36.0–46.0)
Hemoglobin: 10.9 g/dL — ABNORMAL LOW (ref 12.0–15.0)
MCH: 32.4 pg (ref 26.0–34.0)
MCHC: 32.2 g/dL (ref 30.0–36.0)
MCV: 100.9 fL — ABNORMAL HIGH (ref 78.0–100.0)
PLATELETS: 219 10*3/uL (ref 150–400)
RBC: 3.36 MIL/uL — ABNORMAL LOW (ref 3.87–5.11)
RDW: 12.5 % (ref 11.5–15.5)
WBC: 3.3 10*3/uL — ABNORMAL LOW (ref 4.0–10.5)

## 2018-01-18 LAB — GLUCOSE, CAPILLARY: Glucose-Capillary: 97 mg/dL (ref 65–99)

## 2018-01-18 LAB — HEMOGLOBIN A1C
HEMOGLOBIN A1C: 6.2 % — AB (ref 4.8–5.6)
MEAN PLASMA GLUCOSE: 131.24 mg/dL

## 2018-01-18 MED ORDER — CHLORHEXIDINE GLUCONATE CLOTH 2 % EX PADS: 6.0000 | MEDICATED_PAD | Freq: Once | CUTANEOUS | Status: DC

## 2018-01-18 NOTE — Telephone Encounter (Signed)
Mailed patient calendar of upcoming April appointments per 3/21 sch message.

## 2018-01-19 DIAGNOSIS — C50411 Malignant neoplasm of upper-outer quadrant of right female breast: Secondary | ICD-10-CM | POA: Diagnosis not present

## 2018-01-21 NOTE — Anesthesia Preprocedure Evaluation (Addendum)
Anesthesia Evaluation  Patient identified by MRN, date of birth, ID band Patient awake    Reviewed: Allergy & Precautions, NPO status , Patient's Chart, lab work & pertinent test results, reviewed documented beta blocker date and time   Airway Mallampati: I       Dental no notable dental hx. (+) Teeth Intact   Pulmonary neg pulmonary ROS,    Pulmonary exam normal breath sounds clear to auscultation       Cardiovascular hypertension, Pt. on medications and Pt. on home beta blockers Normal cardiovascular exam Rhythm:Regular Rate:Normal     Neuro/Psych    GI/Hepatic GERD  Medicated and Controlled,  Endo/Other  diabetes, Type 2, Oral Hypoglycemic Agents  Renal/GU   negative genitourinary   Musculoskeletal negative musculoskeletal ROS (+)   Abdominal (+) + obese,   Peds  Hematology  (+) Blood dyscrasia, anemia ,   Anesthesia Other Findings Kara Crawford  ECHO COMPLETE WO IMAGING ENHANCING AGENT  Order# [id number]  Reading physician: Sueanne Margarita, MD Ordering physician: Jolaine Artist, MD Study date: 10/25/17 Study Result   Result status: Final result                             *[facility]*                         [address], [place] [address]                            [phone]  ------------------------------------------------------------------- Transthoracic Echocardiography  Patient:    Kara, Crawford MR #:       [id number] Study Date: 10/25/2017 Gender:     F Age:        52 Height:     154.9 cm Weight:     82.1 kg BSA:        1.92 m^2 Pt. Status: Room:   ATTENDING    Default, Provider [phone]  Ellin Saba [id number]  ORDERING     Bensimhon, Daniel  REFERRING    Bensimhon, Daniel  PERFORMING   [facility], Outpatient  SONOGRAPHER  Jannett Celestine,  RDCS  cc:  ------------------------------------------------------------------- LV EF: 60% -   65%  ------------------------------------------------------------------- History:   PMH:  Breast Cancer.  Murmur.  Risk factors: Hypertension. Diabetes mellitus. Dyslipidemia.  ------------------------------------------------------------------- Study Conclusions  - Left ventricle: GLLS appears inaccurate due to inaccurate   tracking. The cavity size was normal. There was moderate   concentric hypertrophy. Systolic function was normal. The   estimated ejection fraction was in the range of 60% to 65%. Wall   motion was normal; there were no regional wall motion   abnormalities. There was an increased relative contribution of   atrial contraction to ventricular filling. Doppler parameters are   consistent with abnormal left ventricular relaxation (grade 1   diastolic dysfunction).  -------------------------------------------------------------------    Reproductive/Obstetrics                            Anesthesia Physical Anesthesia Plan  ASA: II  Anesthesia Plan:  General   Post-op Pain Management:  Regional for Post-op pain   Induction: Intravenous  PONV Risk Score and Plan: 3 and Ondansetron, Dexamethasone, Midazolam and Treatment may vary due to age or medical condition  Airway Management Planned: Oral ETT  Additional Equipment:   Intra-op Plan:   Post-operative Plan: Extubation in OR  Informed Consent: I have reviewed the patients History and Physical, chart, labs and discussed the procedure including the risks, benefits and alternatives for the proposed anesthesia with the patient or authorized representative who has indicated his/her understanding and acceptance.   Dental advisory given  Plan Discussed with: CRNA and Surgeon  Anesthesia Plan Comments:       Anesthesia Quick Evaluation

## 2018-01-21 NOTE — H&P (Signed)
Kara Crawford  Location: [facility] Patient #: [id number] DOB: [birth year] Married / Language: English / Race: Black or African American Female  History of Present Illness   The patient is a 52 year old female who presents with a complaint of right breast cancer.  The PCP is Kara Crawford  The patient was referred by Kara Crawford  The pateint is at the Breast [facility] - Oncology is Kara Crawford and Kara Crawford Kara Crawford for gyn  Her husband is with her today.  I last saw Kara Crawford on 10/18/2017. She saw Dr. Lindi Crawford on 12/20/2017. Her MRI of 12/15/2017 showed no residual breast or axillary tumor.  She was discussed at the Breast Cancer Conference on 11/18/2017. She is a candidate for bilateral mastectomies, targeted right axillary dissection. She was supposed to see Kara Crawford in December, but that did not happen. She is also interested in bilateral oophorectomy. I will also remove her power port. I outlined the plans for surgery with her. I discussed the risk of mastectomies and lymph node biopsies which include bleeding, infection, nerve injury, and possible recurrent malignancy. I think his large as the operation will be, she'll be best served by having her ovaries moved from a separate date.  Plan:1) She sees Dr. Harlow Crawford next week, 2) Bilateral mastectomy, right axillary targeted node dissection, power port removal, 3) bilateral oophorectomy - Kara Crawford (probably on a delayed basis)  Past Medical History: 1. Right breast Cancer Mammograms: at [facility] on 07/13/2017 show 1.0 cm oval mass in the 9 o'clock position. There are 3 abnormal lymph nodes. Biopsy: on 07/17/2017 [phone]) shows IDC, grade 3, ER - 0%, PR - 0%, Ki67 - 90%, Her2Neu - negative. Lymph node POSITIVE for cancer. 2. BRCA 2 positive 3. History of gastric bypass - 09/2014 - Kara Crawford Initial weight - 240, BMI - 45.3 (she actually saw me first, but [facility]  required that Dr. Hassell Done do the bypass) 4. Perforation of GJ - 05/2015 - Kara Crawford 5. HTN x 6 years 6. DM x 6 years - still on Janumet, but Hgb A1C is 6.1 7. Last colonosocpy - Dec 2017 - Kara Crawford 8. her shoulder is still sore 9. Left subclavian power port - 08/02/2017 - Kara Crawford  Social History: She has a husband Kara Crawford), 2 sisters (Kara Crawford and Kara Crawford), and mother Kara Crawford) She has 3 daughters: Kara Crawford (91), Kara Crawford (49), and Kara Crawford (16) Not working right now.   Allergies (Kara Crawford, CMA; 12/22/2017 1:34 PM) No Known Drug Allergies [09/24/2014]:  Medication History (Kara Crawford, CMA; 12/22/2017 1:34 PM) Janumet XR (50-500MG Tablet ER 24HR, Oral) Active. Lipitor (80MG Tablet, Oral) Active. Metoprolol Tartrate (25MG Tablet, Oral) Active. Lisinopril-Hydrochlorothiazide (20-12.5MG Tablet, Oral) Active. AmLODIPine Besylate (10MG Tablet, Oral) Active. Multi Vitamin Daily (Oral) Active. Vitamin B12 (100MCG Tablet, Oral) Active. LORazepam (0.5MG Tablet, Oral) Active. Medications Reconciled  Vitals (Kara Crawford CMA; 12/22/2017 1:34 PM) 12/22/2017 1:34 PM Weight: 182.38 lb Height: 61in Body Surface Area: 1.82 m Body Mass Index: 34.46 kg/m  Temp.: 98.4F(Oral)  Pulse: 89 (Regular)  BP: 104/70 (Sitting, Left Arm, Standard)   Physical Exam  General: WN AA F alert. She is in good spirits. no hair. HEENT: Normal. Pupils equal.  Neck: Supple. No mass. No thyroid mass.  Lymph Nodes: No supraclavicular or cervical nodes.  Lungs: Clear to auscultation and symmetric breath sounds. Heart: RRR. No murmur or rub.  Breasts: Right - No obvious mass. Particular attention paid to the UOQ of the right breast. Left - power in upper  inner quadrant. No mass. She said that she had a pigmented lesion on her nipple that has disappeared with chemotherapy. It is unclear what this was.  Extremities: Good strength and ROM in upper and lower  extremities.  Assessment & Plan  1.  BREAST CANCER, STAGE 2, RIGHT (C50.911)  Story: Mammograms: at [facility] on 07/13/2017 show 1.0 cm oval mass in the 9 o'clock position. There are 3 abnormal lymph nodes.  Biopsy: on 07/17/2017 [id number]) shows IDC, grade 3, ER - 0%, PR - 0%, Ki67 - 90%, Her2Neu - negative, right axilary lymph node POSITIVE for cancer  Oncology: Kara Crawford  Plan:    1)   Addendum Note(Kara Crawford H. Lucia Gaskins MD; 01/03/2018 3:16 PM) She saw Dr. Harlow Crawford 12/28/2017 - they decided on delayed reconstruciton. Either a DIEP or a latissimus muscle flap with implant at a later date. --------------------------- I spoke to the patient by phone. We will go ahead with bilateral mastectomies, targeted right axillary dissection, removal of power port. She will talk to Kara Crawford, but plan her bilateral oophorectomy at another time.    2) Bilateral mastectomy, right axillary targeted node dissection, power port removal,   3) bilateral oophorectomy - Kara Crawford (on a delayed basis)  2.  BRCA2 POSITIVE (Z15.01) 3.  PAPILLOMA OF LEFT BREAST (D24.2)  4.  GASTRIC BYPASS STATUS FOR OBESITY (Z98.84)  09/2014 - Kara Crawford  Initial weight - 240, BMI - 45.3  (she actually saw me first, but [facility] required that Dr. Hassell Done do the bypass) 5. Perforation of GJ - 05/2015 - Kara Crawford 6. HTN x 6 years 7. DM x 6 years - still on Janumet, but Hgb A1C is 6.1 8. her shoulder is still sore 9. Left subclavian power port - 08/02/2017 - Kara Crawford  To remove at surgery  Kara Overall, MD, [facility] Pager: [phone] Office phone:  [phone]

## 2018-01-22 ENCOUNTER — Observation Stay (HOSPITAL_COMMUNITY)
Admission: RE | Admit: 2018-01-22 | Discharge: 2018-01-23 | Disposition: A | Discharge status: Home / Self Care | Payer: 59 | Source: Ambulatory Visit | Attending: Surgery | Admitting: Surgery

## 2018-01-22 ENCOUNTER — Encounter (HOSPITAL_COMMUNITY)
Admission: RE | Admit: 2018-01-22 | Discharge: 2018-01-22 | Disposition: A | Discharge status: Home / Self Care | Payer: 59 | Source: Ambulatory Visit | Attending: Surgery | Admitting: Surgery

## 2018-01-22 ENCOUNTER — Inpatient Hospital Stay (HOSPITAL_COMMUNITY): Payer: 59 | Admitting: Anesthesiology

## 2018-01-22 ENCOUNTER — Encounter (HOSPITAL_COMMUNITY): Payer: Self-pay | Admitting: Surgery

## 2018-01-22 ENCOUNTER — Other Ambulatory Visit (HOSPITAL_COMMUNITY): Payer: 59

## 2018-01-22 ENCOUNTER — Encounter (HOSPITAL_COMMUNITY)
Admission: RE | Disposition: A | Discharge status: Home / Self Care | Payer: Self-pay | Source: Ambulatory Visit | Attending: Surgery

## 2018-01-22 ENCOUNTER — Other Ambulatory Visit: Payer: Self-pay

## 2018-01-22 DIAGNOSIS — Z1501 Genetic susceptibility to malignant neoplasm of breast: Secondary | ICD-10-CM | POA: Insufficient documentation

## 2018-01-22 DIAGNOSIS — N6092 Unspecified benign mammary dysplasia of left breast: Secondary | ICD-10-CM | POA: Diagnosis not present

## 2018-01-22 DIAGNOSIS — E669 Obesity, unspecified: Secondary | ICD-10-CM | POA: Insufficient documentation

## 2018-01-22 DIAGNOSIS — N6011 Diffuse cystic mastopathy of right breast: Secondary | ICD-10-CM | POA: Diagnosis not present

## 2018-01-22 DIAGNOSIS — Z6834 Body mass index (BMI) 34.0-34.9, adult: Secondary | ICD-10-CM | POA: Insufficient documentation

## 2018-01-22 DIAGNOSIS — Z452 Encounter for adjustment and management of vascular access device: Secondary | ICD-10-CM | POA: Insufficient documentation

## 2018-01-22 DIAGNOSIS — C50911 Malignant neoplasm of unspecified site of right female breast: Secondary | ICD-10-CM

## 2018-01-22 DIAGNOSIS — Z7984 Long term (current) use of oral hypoglycemic drugs: Secondary | ICD-10-CM | POA: Insufficient documentation

## 2018-01-22 DIAGNOSIS — K219 Gastro-esophageal reflux disease without esophagitis: Secondary | ICD-10-CM | POA: Insufficient documentation

## 2018-01-22 DIAGNOSIS — N6022 Fibroadenosis of left breast: Secondary | ICD-10-CM | POA: Insufficient documentation

## 2018-01-22 DIAGNOSIS — N6021 Fibroadenosis of right breast: Secondary | ICD-10-CM | POA: Insufficient documentation

## 2018-01-22 DIAGNOSIS — E119 Type 2 diabetes mellitus without complications: Secondary | ICD-10-CM | POA: Insufficient documentation

## 2018-01-22 DIAGNOSIS — D241 Benign neoplasm of right breast: Secondary | ICD-10-CM | POA: Diagnosis not present

## 2018-01-22 DIAGNOSIS — N6091 Unspecified benign mammary dysplasia of right breast: Secondary | ICD-10-CM | POA: Insufficient documentation

## 2018-01-22 DIAGNOSIS — G8918 Other acute postprocedural pain: Secondary | ICD-10-CM | POA: Diagnosis not present

## 2018-01-22 DIAGNOSIS — Z9884 Bariatric surgery status: Secondary | ICD-10-CM | POA: Diagnosis not present

## 2018-01-22 DIAGNOSIS — Z9221 Personal history of antineoplastic chemotherapy: Secondary | ICD-10-CM | POA: Insufficient documentation

## 2018-01-22 DIAGNOSIS — N6489 Other specified disorders of breast: Secondary | ICD-10-CM | POA: Diagnosis not present

## 2018-01-22 DIAGNOSIS — Z79899 Other long term (current) drug therapy: Secondary | ICD-10-CM | POA: Diagnosis not present

## 2018-01-22 DIAGNOSIS — Z171 Estrogen receptor negative status [ER-]: Secondary | ICD-10-CM | POA: Insufficient documentation

## 2018-01-22 DIAGNOSIS — C50411 Malignant neoplasm of upper-outer quadrant of right female breast: Secondary | ICD-10-CM | POA: Diagnosis not present

## 2018-01-22 DIAGNOSIS — N6012 Diffuse cystic mastopathy of left breast: Secondary | ICD-10-CM | POA: Insufficient documentation

## 2018-01-22 DIAGNOSIS — I10 Essential (primary) hypertension: Secondary | ICD-10-CM | POA: Insufficient documentation

## 2018-01-22 DIAGNOSIS — D242 Benign neoplasm of left breast: Secondary | ICD-10-CM | POA: Insufficient documentation

## 2018-01-22 HISTORY — PX: PORT-A-CATH REMOVAL: SHX5289

## 2018-01-22 HISTORY — PX: MASTECTOMY WITH RADIOACTIVE SEED GUIDED EXCISION AND AXILLARY SENTINEL LYMPH NODE BIOPSY: SHX6736

## 2018-01-22 LAB — GLUCOSE, CAPILLARY
Glucose-Capillary: 213 mg/dL — ABNORMAL HIGH (ref 65–99)
Glucose-Capillary: 98 mg/dL (ref 65–99)
Glucose-Capillary: 131 mg/dL — ABNORMAL HIGH (ref 65–99)
Glucose-Capillary: 253 mg/dL — ABNORMAL HIGH (ref 65–99)

## 2018-01-22 SURGERY — MASTECTOMY WITH RADIOACTIVE SEED GUIDED EXCISION AND AXILLARY SENTINEL LYMPH NODE BIOPSY
Anesthesia: General | Site: Chest | Laterality: Left

## 2018-01-22 MED ORDER — GABAPENTIN 300 MG PO CAPS
300.0000 mg | ORAL_CAPSULE | ORAL | Status: AC
  Administered 2018-01-22: 300 mg via ORAL
  Filled 2018-01-22: qty 1

## 2018-01-22 MED ORDER — SODIUM CHLORIDE 0.9 % IJ SOLN
INTRAVENOUS | Status: DC | PRN
  Administered 2018-01-22: 1 mL

## 2018-01-22 MED ORDER — FENTANYL CITRATE (PF) 250 MCG/5ML IJ SOLN
INTRAMUSCULAR | Status: AC
  Filled 2018-01-22: qty 5

## 2018-01-22 MED ORDER — ROPIVACAINE HCL 5 MG/ML IJ SOLN
INTRAMUSCULAR | Status: DC | PRN
  Administered 2018-01-22 (×12): 5 mL via PERINEURAL

## 2018-01-22 MED ORDER — AMLODIPINE BESYLATE 10 MG PO TABS
10.0000 mg | ORAL_TABLET | Freq: Every morning | ORAL | Status: DC
  Administered 2018-01-23: 10 mg via ORAL
  Filled 2018-01-22: qty 1

## 2018-01-22 MED ORDER — MIDAZOLAM HCL 2 MG/2ML IJ SOLN
1.0000 mg | Freq: Once | INTRAMUSCULAR | Status: AC
  Administered 2018-01-22: 1 mg via INTRAVENOUS

## 2018-01-22 MED ORDER — LIDOCAINE HCL (CARDIAC) 20 MG/ML IV SOLN
INTRAVENOUS | Status: AC
  Filled 2018-01-22: qty 20

## 2018-01-22 MED ORDER — OXYCODONE HCL 5 MG PO TABS
ORAL_TABLET | ORAL | Status: AC
  Filled 2018-01-22: qty 2

## 2018-01-22 MED ORDER — LACTATED RINGERS IV SOLN
INTRAVENOUS | Status: DC
  Administered 2018-01-22: 10:00:00 via INTRAVENOUS

## 2018-01-22 MED ORDER — MIDAZOLAM HCL 2 MG/2ML IJ SOLN
2.0000 mg | Freq: Once | INTRAMUSCULAR | Status: AC
  Administered 2018-01-22: 2 mg via INTRAVENOUS

## 2018-01-22 MED ORDER — HYDROMORPHONE HCL 1 MG/ML IJ SOLN: 0.2500 mg | INTRAMUSCULAR | Status: DC | PRN

## 2018-01-22 MED ORDER — MEPERIDINE HCL 50 MG/ML IJ SOLN: 6.2500 mg | INTRAMUSCULAR | Status: DC | PRN

## 2018-01-22 MED ORDER — METHYLENE BLUE 0.5 % INJ SOLN
INTRAVENOUS | Status: AC
  Filled 2018-01-22: qty 10

## 2018-01-22 MED ORDER — MORPHINE SULFATE (PF) 2 MG/ML IV SOLN: 1.0000 mg | INTRAVENOUS | Status: DC | PRN

## 2018-01-22 MED ORDER — METOPROLOL SUCCINATE ER 25 MG PO TB24
25.0000 mg | ORAL_TABLET | Freq: Every morning | ORAL | Status: DC
  Administered 2018-01-23: 25 mg via ORAL
  Filled 2018-01-22: qty 1

## 2018-01-22 MED ORDER — POTASSIUM CHLORIDE IN NACL 20-0.45 MEQ/L-% IV SOLN
INTRAVENOUS | Status: DC
  Administered 2018-01-22: 19:00:00 via INTRAVENOUS
  Filled 2018-01-22 (×2): qty 1000

## 2018-01-22 MED ORDER — PROMETHAZINE HCL 25 MG/ML IJ SOLN: 6.2500 mg | INTRAMUSCULAR | Status: DC | PRN

## 2018-01-22 MED ORDER — HEPARIN SODIUM (PORCINE) 5000 UNIT/ML IJ SOLN
5000.0000 [IU] | Freq: Three times a day (TID) | INTRAMUSCULAR | Status: DC
  Administered 2018-01-22 – 2018-01-23 (×2): 5000 [IU] via SUBCUTANEOUS
  Filled 2018-01-22 (×2): qty 1

## 2018-01-22 MED ORDER — ROCURONIUM BROMIDE 10 MG/ML (PF) SYRINGE
PREFILLED_SYRINGE | INTRAVENOUS | Status: AC
  Filled 2018-01-22: qty 10

## 2018-01-22 MED ORDER — DEXAMETHASONE SODIUM PHOSPHATE 10 MG/ML IJ SOLN
INTRAMUSCULAR | Status: DC | PRN
  Administered 2018-01-22: 10 mg via INTRAVENOUS

## 2018-01-22 MED ORDER — PROPOFOL 10 MG/ML IV BOLUS
INTRAVENOUS | Status: DC | PRN
  Administered 2018-01-22: 150 mg via INTRAVENOUS

## 2018-01-22 MED ORDER — KETOROLAC TROMETHAMINE 15 MG/ML IJ SOLN
INTRAMUSCULAR | Status: AC
  Administered 2018-01-22: 30 mg
  Filled 2018-01-22: qty 2

## 2018-01-22 MED ORDER — LIDOCAINE HCL (CARDIAC) 20 MG/ML IV SOLN
INTRAVENOUS | Status: DC | PRN
  Administered 2018-01-22: 100 mg via INTRAVENOUS

## 2018-01-22 MED ORDER — FENTANYL CITRATE (PF) 100 MCG/2ML IJ SOLN
100.0000 ug | Freq: Once | INTRAMUSCULAR | Status: AC
  Administered 2018-01-22: 100 ug via INTRAVENOUS

## 2018-01-22 MED ORDER — KETOROLAC TROMETHAMINE 30 MG/ML IJ SOLN: 30.0000 mg | Freq: Once | INTRAMUSCULAR | Status: DC | PRN

## 2018-01-22 MED ORDER — LISINOPRIL 40 MG PO TABS
40.0000 mg | ORAL_TABLET | Freq: Every day | ORAL | Status: DC
  Administered 2018-01-22 – 2018-01-23 (×2): 40 mg via ORAL
  Filled 2018-01-22 (×2): qty 1

## 2018-01-22 MED ORDER — CEFAZOLIN SODIUM-DEXTROSE 2-4 GM/100ML-% IV SOLN
INTRAVENOUS | Status: AC
  Filled 2018-01-22: qty 100

## 2018-01-22 MED ORDER — MIDAZOLAM HCL 5 MG/5ML IJ SOLN
INTRAMUSCULAR | Status: DC | PRN
  Administered 2018-01-22: 2 mg via INTRAVENOUS

## 2018-01-22 MED ORDER — MIDAZOLAM HCL 2 MG/2ML IJ SOLN
INTRAMUSCULAR | Status: AC
Start: 2018-01-22 — End: 2018-01-22
  Administered 2018-01-22: 1 mg via INTRAVENOUS
  Filled 2018-01-22: qty 2

## 2018-01-22 MED ORDER — ACETAMINOPHEN 500 MG PO TABS
1000.0000 mg | ORAL_TABLET | ORAL | Status: AC
  Administered 2018-01-22: 1000 mg via ORAL
  Filled 2018-01-22: qty 2

## 2018-01-22 MED ORDER — TECHNETIUM TC 99M SULFUR COLLOID FILTERED
1.0000 | Freq: Once | INTRAVENOUS | Status: AC | PRN
  Administered 2018-01-22: 1 via INTRADERMAL

## 2018-01-22 MED ORDER — MIDAZOLAM HCL 2 MG/2ML IJ SOLN
INTRAMUSCULAR | Status: AC
  Filled 2018-01-22: qty 2

## 2018-01-22 MED ORDER — PHENYLEPHRINE 40 MCG/ML (10ML) SYRINGE FOR IV PUSH (FOR BLOOD PRESSURE SUPPORT)
PREFILLED_SYRINGE | INTRAVENOUS | Status: AC
  Filled 2018-01-22: qty 20

## 2018-01-22 MED ORDER — FENTANYL CITRATE (PF) 100 MCG/2ML IJ SOLN
INTRAMUSCULAR | Status: AC
  Administered 2018-01-22: 100 ug via INTRAVENOUS
  Filled 2018-01-22: qty 2

## 2018-01-22 MED ORDER — CEFAZOLIN SODIUM-DEXTROSE 2-4 GM/100ML-% IV SOLN
2.0000 g | INTRAVENOUS | Status: AC
  Administered 2018-01-22: 2 g via INTRAVENOUS

## 2018-01-22 MED ORDER — HYDROCHLOROTHIAZIDE 25 MG PO TABS
25.0000 mg | ORAL_TABLET | Freq: Every day | ORAL | Status: DC
  Administered 2018-01-22 – 2018-01-23 (×2): 25 mg via ORAL
  Filled 2018-01-22 (×2): qty 1

## 2018-01-22 MED ORDER — LISINOPRIL-HYDROCHLOROTHIAZIDE 20-12.5 MG PO TABS
2.0000 | ORAL_TABLET | Freq: Every day | ORAL | Status: DC
  Filled 2018-01-22: qty 2

## 2018-01-22 MED ORDER — FENTANYL CITRATE (PF) 100 MCG/2ML IJ SOLN
INTRAMUSCULAR | Status: AC
  Administered 2018-01-22: 50 ug via INTRAVENOUS
  Filled 2018-01-22: qty 2

## 2018-01-22 MED ORDER — SODIUM CHLORIDE 0.9 % IJ SOLN
INTRAMUSCULAR | Status: AC
Start: 2018-01-22 — End: ?
  Filled 2018-01-22: qty 10

## 2018-01-22 MED ORDER — 0.9 % SODIUM CHLORIDE (POUR BTL) OPTIME
TOPICAL | Status: DC | PRN
  Administered 2018-01-22: 1000 mL

## 2018-01-22 MED ORDER — ONDANSETRON 4 MG PO TBDP: 4.0000 mg | ORAL_TABLET | Freq: Four times a day (QID) | ORAL | Status: DC | PRN

## 2018-01-22 MED ORDER — TRAMADOL HCL 50 MG PO TABS: 50.0000 mg | ORAL_TABLET | Freq: Four times a day (QID) | ORAL | Status: DC | PRN

## 2018-01-22 MED ORDER — FENTANYL CITRATE (PF) 100 MCG/2ML IJ SOLN
INTRAMUSCULAR | Status: DC | PRN
  Administered 2018-01-22: 150 ug via INTRAVENOUS
  Administered 2018-01-22 (×2): 100 ug via INTRAVENOUS
  Administered 2018-01-22: 50 ug via INTRAVENOUS

## 2018-01-22 MED ORDER — ROCURONIUM BROMIDE 100 MG/10ML IV SOLN
INTRAVENOUS | Status: DC | PRN
  Administered 2018-01-22: 50 mg via INTRAVENOUS

## 2018-01-22 MED ORDER — MIDAZOLAM HCL 2 MG/2ML IJ SOLN
INTRAMUSCULAR | Status: AC
  Administered 2018-01-22: 2 mg via INTRAVENOUS
  Filled 2018-01-22: qty 2

## 2018-01-22 MED ORDER — INSULIN ASPART 100 UNIT/ML ~~LOC~~ SOLN
0.0000 [IU] | Freq: Three times a day (TID) | SUBCUTANEOUS | Status: DC
  Administered 2018-01-23: 3 [IU] via SUBCUTANEOUS

## 2018-01-22 MED ORDER — MORPHINE SULFATE (PF) 4 MG/ML IV SOLN
1.0000 mg | INTRAVENOUS | Status: DC | PRN
  Administered 2018-01-22: 4 mg via INTRAVENOUS
  Filled 2018-01-22: qty 1

## 2018-01-22 MED ORDER — PHENYLEPHRINE 40 MCG/ML (10ML) SYRINGE FOR IV PUSH (FOR BLOOD PRESSURE SUPPORT)
PREFILLED_SYRINGE | INTRAVENOUS | Status: DC | PRN
  Administered 2018-01-22 (×2): 80 ug via INTRAVENOUS

## 2018-01-22 MED ORDER — FENTANYL CITRATE (PF) 100 MCG/2ML IJ SOLN
50.0000 ug | Freq: Once | INTRAMUSCULAR | Status: AC
  Administered 2018-01-22: 50 ug via INTRAVENOUS

## 2018-01-22 MED ORDER — OXYCODONE HCL 5 MG PO TABS
5.0000 mg | ORAL_TABLET | ORAL | Status: DC | PRN
  Administered 2018-01-22 – 2018-01-23 (×2): 10 mg via ORAL
  Filled 2018-01-22: qty 2

## 2018-01-22 MED ORDER — ONDANSETRON HCL 4 MG/2ML IJ SOLN
4.0000 mg | Freq: Four times a day (QID) | INTRAMUSCULAR | Status: DC | PRN
  Administered 2018-01-22: 4 mg via INTRAVENOUS
  Filled 2018-01-22: qty 2

## 2018-01-22 MED ORDER — PROPOFOL 10 MG/ML IV BOLUS
INTRAVENOUS | Status: AC
  Filled 2018-01-22: qty 20

## 2018-01-22 MED ORDER — ONDANSETRON HCL 4 MG/2ML IJ SOLN
INTRAMUSCULAR | Status: DC | PRN
  Administered 2018-01-22: 4 mg via INTRAVENOUS

## 2018-01-22 MED ORDER — WHITE PETROLATUM EX OINT
TOPICAL_OINTMENT | CUTANEOUS | Status: AC
  Administered 2018-01-22: 22:00:00
  Filled 2018-01-22: qty 28.35

## 2018-01-22 SURGICAL SUPPLY — 61 items
APPLIER CLIP 9.375 MED OPEN (MISCELLANEOUS) ×4
ATCH SMKEVC FLXB CAUT HNDSWH (FILTER) ×2 IMPLANT
BINDER BREAST XLRG (GAUZE/BANDAGES/DRESSINGS) ×4 IMPLANT
BIOPATCH RED 1 DISK 7.0 (GAUZE/BANDAGES/DRESSINGS) ×3 IMPLANT
BIOPATCH RED 1IN DISK 7.0MM (GAUZE/BANDAGES/DRESSINGS) ×1
BLADE SURG 10 STRL SS (BLADE) ×4 IMPLANT
BLADE SURG 15 STRL LF DISP TIS (BLADE) ×2 IMPLANT
BLADE SURG 15 STRL SS (BLADE) ×2
CHLORAPREP W/TINT 10.5 ML (MISCELLANEOUS) ×4 IMPLANT
CLIP APPLIE 9.375 MED OPEN (MISCELLANEOUS) ×2 IMPLANT
CONT SPEC STER OR (MISCELLANEOUS) ×12 IMPLANT
COVER PROBE W GEL 5X96 (DRAPES) ×4 IMPLANT
COVER SURGICAL LIGHT HANDLE (MISCELLANEOUS) ×4 IMPLANT
DERMABOND ADVANCED (GAUZE/BANDAGES/DRESSINGS) ×6
DERMABOND ADVANCED .7 DNX12 (GAUZE/BANDAGES/DRESSINGS) ×6 IMPLANT
DRAIN CHANNEL 19F RND (DRAIN) ×16 IMPLANT
DRAPE HALF SHEET 40X57 (DRAPES) ×4 IMPLANT
DRAPE LAPAROSCOPIC ABDOMINAL (DRAPES) ×4 IMPLANT
DRAPE LAPAROTOMY 100X72 PEDS (DRAPES) IMPLANT
DRAPE UTILITY XL STRL (DRAPES) ×4 IMPLANT
DRSG TEGADERM 2-3/8X2-3/4 SM (GAUZE/BANDAGES/DRESSINGS) ×4 IMPLANT
ELECT CAUTERY BLADE 6.4 (BLADE) ×4 IMPLANT
ELECT REM PT RETURN 9FT ADLT (ELECTROSURGICAL) ×4
ELECTRODE REM PT RTRN 9FT ADLT (ELECTROSURGICAL) ×2 IMPLANT
EVACUATOR SILICONE 100CC (DRAIN) ×16 IMPLANT
EVACUATOR SMOKE ACCUVAC VALLEY (FILTER) ×2
FILTER STRAW FLUID ASPIR (MISCELLANEOUS) ×4 IMPLANT
GAUZE SPONGE 4X4 12PLY STRL LF (GAUZE/BANDAGES/DRESSINGS) ×4 IMPLANT
GAUZE SPONGE 4X4 16PLY XRAY LF (GAUZE/BANDAGES/DRESSINGS) ×4 IMPLANT
GLOVE SURG SIGNA 7.5 PF LTX (GLOVE) ×8 IMPLANT
GLOVE SURG SS PI 6.5 STRL IVOR (GLOVE) ×4 IMPLANT
GOWN STRL REUS W/ TWL LRG LVL3 (GOWN DISPOSABLE) ×2 IMPLANT
GOWN STRL REUS W/ TWL XL LVL3 (GOWN DISPOSABLE) ×2 IMPLANT
GOWN STRL REUS W/TWL LRG LVL3 (GOWN DISPOSABLE) ×2
GOWN STRL REUS W/TWL XL LVL3 (GOWN DISPOSABLE) ×2
KIT BASIN OR (CUSTOM PROCEDURE TRAY) ×4 IMPLANT
KIT MARKER MARGIN INK (KITS) ×4 IMPLANT
KIT ROOM TURNOVER OR (KITS) ×4 IMPLANT
NEEDLE 18GX1X1/2 (RX/OR ONLY) (NEEDLE) ×4 IMPLANT
NEEDLE HYPO 25GX1X1/2 BEV (NEEDLE) ×8 IMPLANT
NS IRRIG 1000ML POUR BTL (IV SOLUTION) ×20 IMPLANT
PACK SURGICAL SETUP 50X90 (CUSTOM PROCEDURE TRAY) ×4 IMPLANT
PAD ABD 8X10 STRL (GAUZE/BANDAGES/DRESSINGS) ×4 IMPLANT
PAD ARMBOARD 7.5X6 YLW CONV (MISCELLANEOUS) ×8 IMPLANT
PENCIL BUTTON HOLSTER BLD 10FT (ELECTRODE) ×4 IMPLANT
SPONGE LAP 18X18 X RAY DECT (DISPOSABLE) ×16 IMPLANT
STAPLER VISISTAT 35W (STAPLE) ×4 IMPLANT
SUT ETHILON 2 0 FS 18 (SUTURE) ×12 IMPLANT
SUT ETHILON 3 0 FSL (SUTURE) ×8 IMPLANT
SUT MON AB 4-0 PC3 18 (SUTURE) ×12 IMPLANT
SUT SILK 2 0 PERMA HAND 18 BK (SUTURE) ×4 IMPLANT
SUT VIC AB 3-0 SH 18 (SUTURE) ×12 IMPLANT
SUT VIC AB 3-0 SH 27 (SUTURE) ×2
SUT VIC AB 3-0 SH 27XBRD (SUTURE) ×2 IMPLANT
SUT VIC AB 3-0 SH 8-18 (SUTURE) ×4 IMPLANT
SYR CONTROL 10ML LL (SYRINGE) ×4 IMPLANT
TOWEL OR 17X24 6PK STRL BLUE (TOWEL DISPOSABLE) ×4 IMPLANT
TOWEL OR 17X26 10 PK STRL BLUE (TOWEL DISPOSABLE) ×4 IMPLANT
TUBE CONNECTING 12'X1/4 (SUCTIONS) ×1
TUBE CONNECTING 12X1/4 (SUCTIONS) ×3 IMPLANT
YANKAUER SUCT BULB TIP NO VENT (SUCTIONS) ×4 IMPLANT

## 2018-01-22 NOTE — Anesthesia Postprocedure Evaluation (Signed)
Anesthesia Post Note  Patient: Shayonna Ocampo  Procedure(s) Performed: BILATERAL MASTECTOMIES WITH RADIOACTIVE SEED TARGETED RIGHT AXILLARY [place] NODE EXCISION AND RIGHT SENTINEL LYMPH NODE BIOPSY (Bilateral Breast) REMOVAL PORT-A-CATH (Left Chest)     Patient location during evaluation: PACU Anesthesia Type: General Level of consciousness: awake and sedated Pain management: pain level controlled Vital Signs Assessment: post-procedure vital signs reviewed and stable Respiratory status: spontaneous breathing Cardiovascular status: stable Postop Assessment: no apparent nausea or vomiting Anesthetic complications: no    Last Vitals:  Vitals:   01/22/18 1645 01/22/18 1700  BP: 135/83 (!) 142/92  Pulse: 76 70  Resp: 15 17  Temp: (!) 36.2 C   SpO2: 100% 100%    Last Pain:  Vitals:   01/22/18 1740  TempSrc:   PainSc: 8    Pain Goal: Patients Stated Pain Goal: 3 (01/22/18 1740)               Jameriah Trotti JR,JOHN Mateo Flow

## 2018-01-22 NOTE — Interval H&P Note (Signed)
History and Physical Interval Note:  01/22/2018 11:53 AM  Kara Crawford  has presented today for surgery, with the diagnosis of BRCA2, RIGHT BREAST CANCER  The various methods of treatment have been discussed with the patient and family.   Getting block.  Her husband is with her.  After consideration of risks, benefits and other options for treatment, the patient has consented to  Procedure(s): BILATERAL MASTECTOMIES WITH RADIOACTIVE SEED TARGETED RIGHT AXILLARY [place] AND RIGHT SENTINEL LYMPH NODE BIOPSY (Bilateral) REMOVAL PORT-A-CATH (N/A) as a surgical intervention .  The patient's history has been reviewed, patient examined, no change in status, stable for surgery.  I have reviewed the patient's chart and labs.  Questions were answered to the patient's satisfaction.     Shann Medal

## 2018-01-22 NOTE — Anesthesia Procedure Notes (Signed)
Anesthesia Regional Block: Pectoralis block   Pre-Anesthetic Checklist: ,, timeout performed, Correct Patient, Correct Site, Correct Laterality, Correct Procedure, Correct Position, site marked, Risks and benefits discussed,  Surgical consent,  Pre-op evaluation,  At surgeon's request and post-op pain management  Laterality: Right  Prep: chloraprep       Needles:  Injection technique: Single-shot  Needle Type: Echogenic Stimulator Needle     Needle Length: 10cm  Needle Gauge: 21   Needle insertion depth: 4 cm   Additional Needles:   Procedures:,,,, ultrasound used (permanent image in chart),,,,  Narrative:  Start time: 01/22/2018 11:10 AM End time: 01/22/2018 11:25 AM Injection made incrementally with aspirations every 5 mL.  Performed by: Personally  Anesthesiologist: Lyn Hollingshead, MD

## 2018-01-22 NOTE — Op Note (Signed)
01/22/2018  4:37 PM  PATIENT:  Kara Crawford, 52 y.o., female, MRN: [id number]  PREOP DIAGNOSIS:  BRCA2 POSITIVE, RIGHT BREAST CANCER  POSTOP DIAGNOSIS:   BRCA2 POSITIVE, RIGHT BREAST CANCER, 9 o'clock position (T1, N1)  PROCEDURE:   Procedure(s): BILATERAL MASTECTOMIES WITH RADIOACTIVE SEED TARGETED RIGHT AXILLARY [place] NODE EXCISION AND RIGHT SENTINEL LYMPH NODE BIOPSY, injection of methylene blue (1.0 cc), deep sentinel lymph node biopsy REMOVAL PORT-A-CATH   SURGEON:   Alphonsa Overall, M.D.  ASSISTANT:   None  ANESTHESIA:   general  Anesthesiologist: Lyn Hollingshead, MD CRNA: Laretta Alstrom, CRNA; Decarlo, Arther Dames, CRNA; Freddie Breech, CRNA  General  ASA:  2  EBL:  150  ml  BLOOD ADMINISTERED: none  DRAINS: 4 19 F Blake drains, 2 on the right side and 2 on the left  LOCAL MEDICATIONS USED:   Bilateral pectoral block  SPECIMEN:   Left mastectomy (suture lateral), Right mastectomy (suture lateral), Right targeted axillary node dissection (counts 800, back ground 30), Second sentinel lymph node excision, left upper mastectomy skin (long suture lateral, short suture superior), right upper mastectomy skin (long suture lateral, short suture superior)  COUNTS CORRECT:  YES  INDICATIONS FOR PROCEDURE:  Kara Crawford is a 52 y.o. (DOB: [birth year]) AA female whose primary care physician is Cari Caraway, MD and comes for bilateral mastectomies and targeted right axillary node dissection.   Kara Crawford has completed neoadjuvant chemotherapy for a triple negative right breast cancer by Dr. Lindi Adie.  A MRI on 12/15/2017 showed no residual tumor.   She is also BRCA 2+.  She has seen Dr. Baruch Goldmann for consideration of immediate reconstruction, but she is going to do this on a delayed fashion.   The indications and risks of the surgery were explained to the patient.  The risks include, but are not limited to, infection, bleeding, and nerve injury.  OPERATIVE NOTE;  The patient  was taken to room # [facility] at [facility] where she underwent a general anesthesia  supervised by Anesthesiologist: Lyn Hollingshead, MD CRNA: Laretta Alstrom, CRNA; Decarlo, Arther Dames, CRNA; Freddie Breech, CRNA. Both her breast and axilla were prepped with ChloraPrep and sterilely draped.    A time-out and the surgical check list was reviewed.    I injected about 0.5 mL of 40% methylene blue around her right areola.   I started on the benign side.  I made an elliptical incision including the areola in the left breast.  I developed skin flaps medially to the lateral edge of the sternum, inferiorly to the investing fascia of the rectus abdominus muscle, laterally to the anterior edge of the latissimus dorsi muscle, and superiorly to about 2 finger breaths below the clavicle.  The breast was reflected off the pectoralis muscle from medial to lateral.  The lateral attachments in the left axilla were divided and the breast removed.  A long suture was placed on the lateral aspect of the breast.  I was able to remove the left subclavian port through the left mastectomy incision.   I then went to the right breast, where the patient had a diagnosis of breast cancer.  I made an elliptical incision including the areola in the right breast.  I developed skin flaps medially to the lateral edge of the sternum, inferiorly to the investing fascia of the rectus abdominus muscle, laterally to the anterior edge of the latissimus dorsi muscle, and superiorly to about 2 finger breaths below the clavicle.  The breast  was reflected off the pectoralis muscle from medial to lateral.  The lateral attachments in the right axilla were divided and the breast removed.  A long suture was placed on the lateral aspect of the breast.   I dissected into the right axilla and found a deep sentinel lymph node and the seed which marked the previous biopsy positive axillary node.  The node had counts of 800 with a background count of 30.   The lymph node was not blue.  This was sent as a separate specimen. The axillary specimen was placed in the Faxitron and we could see the clip from the prior biopsy and the seed.   She had additional tissue which was "hot" with counts of about 800 - so I took a second lymph node excision.   I brought out 2 30 F Blake drains below the inferior flaps on the left and  2 19 F Blake drains below the inferior flaps on the right.   These were sewn in place with 2-0 Nylons.  I irrigated the wounds with 4,000 cc of fluid.   The skin was closed with interrupted 3-0 Vicryl sutures and the skin was closed with a 4-0 Monocryl.  The wound was painted with Dermabond.    A pressure dressing was placed on the wound and the chest wrapped with a breast binder.  Her needle and sponge count were correct at the end of the case.   She was transferred to the recovery room in good condition.  Alphonsa Overall, MD, [facility] Pager: [phone] Office phone:  [phone]

## 2018-01-22 NOTE — Transfer of Care (Signed)
Immediate Anesthesia Transfer of Care Note  Patient: Kara Crawford  Procedure(s) Performed: BILATERAL MASTECTOMIES WITH RADIOACTIVE SEED TARGETED RIGHT AXILLARY [place] NODE EXCISION AND RIGHT SENTINEL LYMPH NODE BIOPSY (Bilateral Breast) REMOVAL PORT-A-CATH (Left Chest)  Patient Location: PACU  Anesthesia Type:General  Level of Consciousness: awake, alert , oriented and patient cooperative  Airway & Oxygen Therapy: Patient Spontanous Breathing and Patient connected to nasal cannula oxygen  Post-op Assessment: Report given to RN and Post -op Vital signs reviewed and stable  Post vital signs: Reviewed and stable  Last Vitals:  Vitals Value Taken Time  BP 135/83 01/22/2018  4:46 PM  Temp    Pulse 80 01/22/2018  4:46 PM  Resp 17 01/22/2018  4:46 PM  SpO2 100 % 01/22/2018  4:46 PM  Vitals shown include unvalidated device data.  Last Pain:  Vitals:   01/22/18 0946  TempSrc:   PainSc: 0-No pain         Complications: No apparent anesthesia complications

## 2018-01-22 NOTE — Anesthesia Procedure Notes (Signed)
Anesthesia Regional Block: Pectoralis block   Pre-Anesthetic Checklist: ,, timeout performed, Correct Patient, Correct Site, Correct Laterality, Correct Procedure, Correct Position, site marked, Risks and benefits discussed,  Surgical consent,  Pre-op evaluation,  At surgeon's request and post-op pain management  Laterality: Left  Prep: chloraprep       Needles:  Injection technique: Single-shot  Needle Type: Echogenic Stimulator Needle     Needle Length: 10cm  Needle Gauge: 21   Needle insertion depth: 3.5 cm   Additional Needles:   Procedures:,,,, ultrasound used (permanent image in chart),,,,  Narrative:  Start time: 01/22/2018 11:30 AM End time: 01/22/2018 11:45 AM Injection made incrementally with aspirations every 5 mL.  Performed by: Personally  Anesthesiologist: Lyn Hollingshead, MD

## 2018-01-23 ENCOUNTER — Encounter (HOSPITAL_COMMUNITY): Payer: Self-pay | Admitting: Surgery

## 2018-01-23 DIAGNOSIS — C50911 Malignant neoplasm of unspecified site of right female breast: Secondary | ICD-10-CM | POA: Diagnosis not present

## 2018-01-23 LAB — GLUCOSE, CAPILLARY
Glucose-Capillary: 109 mg/dL — ABNORMAL HIGH (ref 65–99)
Glucose-Capillary: 131 mg/dL — ABNORMAL HIGH (ref 65–99)

## 2018-01-23 MED ORDER — HYDROCODONE-ACETAMINOPHEN 5-325 MG PO TABS: 1.0000 | ORAL_TABLET | Freq: Four times a day (QID) | ORAL | 0 refills | Status: DC | PRN

## 2018-01-23 NOTE — Progress Notes (Signed)
Pt was discharged to go home.  Discharge instructions and prescriptions given.

## 2018-01-23 NOTE — Discharge Instructions (Signed)
[facility] - DISCHARGE INSTRUCTIONS TO PATIENT  Activity:  Driving - May drive in 2 to 4 days, if doing well and off pain meds.   Lifting - No lifting more than 15 pounds for next 7 days, then no limit  Wound Care:   May shower starting Thursday, 3/28.        Empty and record drainage from a the drains.  Bring that sheet with you to the office.  Diet:  As tolerated.  Follow up appointment:  Call Dr. Pollie Friar office [facility]) at [phone] for an appointment in 1 week.  Medications and dosages:  Resume your home medications.  You have a prescription for:  Vicodin  Call Dr. Lucia Gaskins or his office  [phone]) if you have:  Temperature greater than 100.4,  Persistent nausea and vomiting,  Severe uncontrolled pain,  Redness, tenderness, or signs of infection (pain, swelling, redness, odor or green/yellow discharge around the site),  Difficulty breathing, headache or visual disturbances,  Any other questions or concerns you may have after discharge.  In an emergency, call 911 or go to an Emergency Department at a nearby hospital.

## 2018-01-23 NOTE — Discharge Summary (Signed)
Physician Discharge Summary  Patient ID:  Kara Crawford  MRN: [id number]  DOB/AGE: 04-17-1966 52 y.o.  Admit date: 01/22/2018 Discharge date: 01/23/2018  Discharge Diagnoses:  1.  BREAST CANCER, STAGE 2, RIGHT (C50.911)             Story: Mammograms: at [facility] on 07/13/2017 show 1.0 cm oval mass in the 9 o'clock position. There are 3 abnormal lymph nodes.             Biopsy: on 07/17/[phone]) shows IDC, grade 3, ER - 0%, PR - 0%, Ki67 - 90%, Her2Neu - negative, right axilary lymph node POSITIVE for cancer             Oncology: Lindi Adie and [facility]  2.  BRCA2 POSITIVE (Z15.01) 3.  PAPILLOMA OF LEFT BREAST (D24.2) 4.  GASTRIC BYPASS STATUS FOR OBESITY (Z98.84)             09/2014 - Hassell Done          Initial weight - 240, BMI - 45.3 5. Perforation of GJ - 05/2015 - Marcea Rojek 6. HTN x 6 years 7. DM x 6 years - still on Janumet, but Hgb A1C is 6.1   Active Problems:   Cancer of right breast, stage 2 (HCC)   Operation: Procedure(s):  BILATERAL MASTECTOMIES WITH RADIOACTIVE SEED TARGETED RIGHT AXILLARY [place] NODE EXCISION AND RIGHT SENTINEL LYMPH NODE BIOPSY, REMOVAL PORT-A-CATH on 01/22/2018 - [facility]  Discharged Condition: good  Hospital Course: Kara Crawford is an 52 y.o. female whose primary care physician is Cari Caraway, MD and who was admitted 01/22/2018 with a chief complaint of right breast cancer and BRCA 2 gene carrier.  She has completed neoadjuvant chemotx supervised by Dr. Lindi Adie.  She now comes for surgery.  She was brought to the operating room on 01/22/2018 and underwent  BILATERAL MASTECTOMIES WITH RADIOACTIVE SEED TARGETED RIGHT AXILLARY [place] NODE EXCISION AND RIGHT SENTINEL LYMPH NODE BIOPSY, REMOVAL PORT-A-CATH.  She is now one day post op.  She has a lot of family in the room. She is ready to go home. The discharge instructions were reviewed with the patient.  Consults: None  Significant Diagnostic Studies: Results for orders placed or performed during the  hospital encounter of 01/22/18  Glucose, capillary  Result Value Ref Range   Glucose-Capillary 213 (H) 65 - 99 mg/dL  Glucose, capillary  Result Value Ref Range   Glucose-Capillary 98 65 - 99 mg/dL  Glucose, capillary  Result Value Ref Range   Glucose-Capillary 131 (H) 65 - 99 mg/dL   Comment 1 Notify RN    Comment 2 Document in Chart   Glucose, capillary  Result Value Ref Range   Glucose-Capillary 253 (H) 65 - 99 mg/dL  Glucose, capillary  Result Value Ref Range   Glucose-Capillary 109 (H) 65 - 99 mg/dL  Glucose, capillary  Result Value Ref Range   Glucose-Capillary 131 (H) 65 - 99 mg/dL    Nm Sentinel Node Inj-no Rpt (breast)  Result Date: 01/22/2018 Sulfur colloid was injected by the nuclear medicine technologist for melanoma sentinel node.    Discharge Exam:  Vitals:   01/23/18 0518 01/23/18 1045  BP: 101/66 115/69  Pulse: 60 77  Resp: 16 16  Temp: 98.9 F (37.2 C) 98.7 F (37.1 C)  SpO2: 96% 98%    General: Mildly obese AA F who is alert and generally healthy appearing.  Lungs: Clear to auscultation and symmetric breath sounds. Chest:  Both mastectomy wounds look good.  She  has 4 drains:  1,2,3,4 - 50, 20, 70, 126 cc last 24 hours.  Discharge Medications:   Allergies as of 01/23/2018   No Known Allergies     Medication List    STOP taking these medications   LORazepam 0.5 MG tablet Commonly known as:  ATIVAN     TAKE these medications   acetaminophen 500 MG tablet Commonly known as:  TYLENOL Take 1,000 mg by mouth daily as needed for moderate pain or headache.   amLODipine 10 MG tablet Commonly known as:  NORVASC Take 10 mg by mouth every morning.   atorvastatin 80 MG tablet Commonly known as:  LIPITOR Take 80 mg by mouth every morning.   CALCIUM 500/D PO Take 2 tablets by mouth daily.   diphenhydramine-acetaminophen 25-500 MG Tabs tablet Commonly known as:  TYLENOL PM Take 1-2 tablets by mouth at bedtime as needed (sleep).    HYDROcodone-acetaminophen 5-325 MG tablet Commonly known as:  NORCO/VICODIN Take 1 tablet by mouth every 6 (six) hours as needed for moderate pain.   lidocaine-prilocaine cream Commonly known as:  EMLA Apply to affected area once What changed:    how much to take  how to take this  when to take this  reasons to take this  additional instructions   lisinopril-hydrochlorothiazide 20-12.5 MG tablet Commonly known as:  PRINZIDE,ZESTORETIC Take 2 tablets by mouth daily.   metoprolol succinate 25 MG 24 hr tablet Commonly known as:  TOPROL-XL Take 25 mg by mouth every morning.   pantoprazole 40 MG tablet Commonly known as:  PROTONIX Take 40 mg by mouth daily.   sitaGLIPtin-metformin 50-500 MG tablet Commonly known as:  JANUMET Take 1 tablet by mouth 2 (two) times daily with a meal.   SYSTANE OP Place 1 drop into both eyes daily as needed (dry eyes).   vitamin B-12 1000 MCG tablet Commonly known as:  CYANOCOBALAMIN Take 2,000 mcg by mouth daily.   Vitamin D (Ergocalciferol) 50000 units Caps capsule Commonly known as:  DRISDOL Take 50,000 Units by mouth every Tuesday.   Vitamin D3 1000 units Caps Take 5,000 Units by mouth daily.       Disposition: Discharge disposition: 01-Home or Self Care       Discharge Instructions    Diet - low sodium heart healthy   Complete by:  As directed    Increase activity slowly   Complete by:  As directed       Signed: Alphonsa Overall, M.D., [facility] Surgery Office:  [phone]  01/23/2018, 12:48 PM

## 2018-01-26 ENCOUNTER — Encounter: Payer: Self-pay | Admitting: Radiation Oncology

## 2018-01-26 ENCOUNTER — Other Ambulatory Visit: Payer: Self-pay | Admitting: *Deleted

## 2018-01-26 DIAGNOSIS — C50411 Malignant neoplasm of upper-outer quadrant of right female breast: Secondary | ICD-10-CM

## 2018-01-26 DIAGNOSIS — Z171 Estrogen receptor negative status [ER-]: Principal | ICD-10-CM

## 2018-01-30 ENCOUNTER — Inpatient Hospital Stay: Payer: 59 | Attending: Hematology and Oncology | Admitting: Hematology and Oncology

## 2018-01-30 ENCOUNTER — Telehealth: Payer: Self-pay | Admitting: Hematology and Oncology

## 2018-01-30 DIAGNOSIS — Z7984 Long term (current) use of oral hypoglycemic drugs: Secondary | ICD-10-CM | POA: Insufficient documentation

## 2018-01-30 DIAGNOSIS — Z171 Estrogen receptor negative status [ER-]: Secondary | ICD-10-CM | POA: Insufficient documentation

## 2018-01-30 DIAGNOSIS — Z9011 Acquired absence of right breast and nipple: Secondary | ICD-10-CM | POA: Diagnosis not present

## 2018-01-30 DIAGNOSIS — Z1501 Genetic susceptibility to malignant neoplasm of breast: Secondary | ICD-10-CM | POA: Insufficient documentation

## 2018-01-30 DIAGNOSIS — Z9012 Acquired absence of left breast and nipple: Secondary | ICD-10-CM | POA: Insufficient documentation

## 2018-01-30 DIAGNOSIS — C50411 Malignant neoplasm of upper-outer quadrant of right female breast: Secondary | ICD-10-CM | POA: Diagnosis not present

## 2018-01-30 NOTE — Telephone Encounter (Signed)
Gave patient AVS and calendar of upcoming July appointments.  °

## 2018-01-30 NOTE — Assessment & Plan Note (Signed)
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 I discussed the margins as well as lymph node surgeries. We also discussed the final staging along with previously performed ER/PR and HER-2/neu testing.  Genetic testing:BRCA2 mutation positive  Recommendation: Adjuvant radiation therapy followed by observation Patient will need oophorectomy.  Return to clinic in 6 months with surveillance and follow-up

## 2018-01-30 NOTE — Progress Notes (Signed)
Patient Care Team: Cari Caraway, MD as PCP - General (Family Medicine) Christophe Louis, MD as Consulting Physician (Obstetrics and Gynecology) Alphonsa Overall, MD as Consulting Physician (General Surgery) Nicholas Lose, MD as Consulting Physician (Hematology and Oncology) Eppie Gibson, MD as Attending Physician (Radiation Oncology)  DIAGNOSIS:  Encounter Diagnosis  Name Primary?  . 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  The Common Hereditary Cancers Panel was ordered.  The Hereditary Gene Panel offered by [facility] includes sequencing and/or deletion duplication testing of the following 46 genes: APC, ATM, AXIN2, BARD1, BMPR1A, BRCA1, BRCA2, BRIP1, CDH1, CDKN2A (p14ARF), CDKN2A (p16INK4a), CHEK2, CTNNA1, DICER1, EPCAM (Deletion/duplication testing only), GREM1 (promoter region deletion/duplication testing only), KIT, MEN1, MLH1, MSH2, MSH3, MSH6, MUTYH, NBN, NF1, NHTL1, PALB2, PDGFRA, PMS2, POLD1, POLE, PTEN, RAD50, RAD51C, RAD51D, SDHB, SDHC, SDHD, SMAD4, SMARCA4. STK11, TP53, TSC1, TSC2, and VHL.  The following genes were evaluated for sequence changes only: SDHA and HOXB13 c.251G>A variant only.        Results: POSITIVE for a mutation in BRCA2 [id number] (p.Asp1540Glyfs*6).  The date of this test report is 08/18/2017.      12/15/2017 Breast MRI    Radiologic complete response      01/22/2018 Surgery    Bilateral mastectomies: Right mastectomy: No residual cancer, 0/5 lymph nodes negative; left mastectomy: UDH, FA, no malignancy, ER 0%, PR 0%, HER-2 negative, Ki-67 90%, complete pathologic response       CHIEF COMPLIANT: Follow-up after surgery to discuss pathology report  INTERVAL HISTORY: Kara Crawford is a 52 year old with above-mentioned history of BRCA2 mutation positive, triple negative breast cancer who underwent neoadjuvant chemotherapy and had a radiologic complete response.  She underwent bilateral mastectomies and is here today to discuss the pathology report.  Final pathology did not identify any evidence of cancer.  REVIEW OF SYSTEMS:   Constitutional: Denies fevers, chills or abnormal weight loss Eyes: Denies blurriness of vision Ears, nose, mouth, throat, and face: Denies mucositis or sore throat Respiratory: Denies cough, dyspnea or wheezes Cardiovascular: Denies palpitation, chest discomfort Gastrointestinal:  Denies nausea, heartburn or change in bowel habits Skin: Denies abnormal skin rashes Lymphatics: Denies new lymphadenopathy or easy bruising Neurological:Denies numbness, tingling or new weaknesses Behavioral/Psych: Mood is stable, no new changes  Extremities: No lower extremity edema  All other systems were reviewed with the patient and are negative.  I have reviewed the past medical history, past surgical history, social history and family history with the patient and they are unchanged from previous note.  ALLERGIES:  has No Known Allergies.  MEDICATIONS:  Current Outpatient Medications  Medication Sig Dispense Refill  . acetaminophen (TYLENOL) 500 MG tablet Take 1,000 mg by mouth daily as needed for  moderate pain or headache.    Marland Kitchen  amLODipine (NORVASC) 10 MG tablet Take 10 mg by mouth every morning.     Marland Kitchen atorvastatin (LIPITOR) 80 MG tablet Take 80 mg by mouth every morning.     . Calcium Carbonate-Vitamin D (CALCIUM 500/D PO) Take 2 tablets by mouth daily.    . Cholecalciferol (VITAMIN D3) 1000 units CAPS Take 5,000 Units by mouth daily.    . diphenhydramine-acetaminophen (TYLENOL PM) 25-500 MG TABS tablet Take 1-2 tablets by mouth at bedtime as needed (sleep).    Marland Kitchen HYDROcodone-acetaminophen (NORCO/VICODIN) 5-325 MG tablet Take 1 tablet by mouth every 6 (six) hours as needed for moderate pain. 20 tablet 0  . lidocaine-prilocaine (EMLA) cream Apply to affected area once (Patient taking differently: Apply 1 application topically daily as needed (port access). ) 30 g 3  . lisinopril-hydrochlorothiazide (PRINZIDE,ZESTORETIC) 20-12.5 MG per tablet Take 2 tablets by mouth daily.     . metoprolol succinate (TOPROL-XL) 25 MG 24 hr tablet Take 25 mg by mouth every morning.     . pantoprazole (PROTONIX) 40 MG tablet Take 40 mg by mouth daily.    Vladimir Faster Glycol-Propyl Glycol (SYSTANE OP) Place 1 drop into both eyes daily as needed (dry eyes).    . sitaGLIPtin-metformin (JANUMET) 50-500 MG per tablet Take 1 tablet by mouth 2 (two) times daily with a meal.     . vitamin B-12 (CYANOCOBALAMIN) 1000 MCG tablet Take 2,000 mcg by mouth daily.    . Vitamin D, Ergocalciferol, (DRISDOL) 50000 units CAPS capsule Take 50,000 Units by mouth every Tuesday.     No current facility-administered medications for this visit.    Facility-Administered Medications Ordered in Other Visits  Medication Dose Route Frequency Provider Last Rate Last Dose  . sodium chloride flush (NS) 0.9 % injection 10 mL  10 mL Intravenous PRN Nicholas Lose, MD   10 mL at 08/03/17 1516  . sodium chloride flush (NS) 0.9 % injection 10 mL  10 mL Intravenous PRN Nicholas Lose, MD   10 mL at 08/17/17 1531  . sodium chloride flush (NS) 0.9 % injection 10 mL  10 mL Intravenous  PRN Nicholas Lose, MD   10 mL at 10/26/17 1014    PHYSICAL EXAMINATION: ECOG PERFORMANCE STATUS: 1 - Symptomatic but completely ambulatory  Vitals:   01/30/18 1022  BP: 111/66  Pulse: 66  Resp: 18  Temp: 98.9 F (37.2 C)  SpO2: 100%   Filed Weights   01/30/18 1022  Weight: 181 lb 3.2 oz (82.2 kg)    GENERAL:alert, no distress and comfortable SKIN: skin color, texture, turgor are normal, no rashes or significant lesions EYES: normal, Conjunctiva are pink and non-injected, sclera clear OROPHARYNX:no exudate, no erythema and lips, buccal mucosa, and tongue normal  NECK: supple, thyroid normal size, non-tender, without nodularity LYMPH:  no palpable lymphadenopathy in the cervical, axillary or inguinal LUNGS: clear to auscultation and percussion with normal breathing effort HEART: regular rate & rhythm and no murmurs and no lower extremity edema ABDOMEN:abdomen soft, non-tender and normal bowel sounds MUSCULOSKELETAL:no cyanosis of digits and no clubbing  NEURO: alert & oriented x 3 with fluent speech, no focal motor/sensory deficits EXTREMITIES: No lower extremity edema  LABORATORY DATA:  I have reviewed the data as listed CMP Latest Ref Rng & Units 01/18/2018 12/14/2017 12/07/2017  Glucose 65 - 99 mg/dL 104(H) 109 85  BUN 6 - 20 mg/dL '15 11 15  '$ Creatinine 0.44 - 1.00 mg/dL 0.81 0.90 0.86  Sodium  135 - 145 mmol/L 139 142 142  Potassium 3.5 - 5.1 mmol/L 3.7 4.0 3.9  Chloride 101 - 111 mmol/L 104 105 105  CO2 22 - 32 mmol/L '26 26 28  '$ Calcium 8.9 - 10.3 mg/dL 9.6 10.1 10.1  Total Protein 6.4 - 8.3 g/dL - 7.2 7.3  Total Bilirubin 0.2 - 1.2 mg/dL - 0.3 0.4  Alkaline Phos 40 - 150 U/L - 49 52  AST 5 - 34 U/L - 25 23  ALT 0 - 55 U/L - 39 31    Lab Results  Component Value Date   WBC 3.3 (L) 01/18/2018   HGB 10.9 (L) 01/18/2018   HCT 33.9 (L) 01/18/2018   MCV 100.9 (H) 01/18/2018   PLT 219 01/18/2018   NEUTROABS 1.4 (L) 12/14/2017    ASSESSMENT & PLAN:  Malignant  neoplasm of upper-outer quadrant of right breast in female, estrogen receptor negative ([facility]) 07/17/2017:Right upper outer quadrant painful palpable right breast mass by ultrasound measured 1 cm with 3 abnormal axillary lymph nodes 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 I discussed the margins as well as lymph node surgeries. We also discussed the final staging along with previously performed ER/PR and HER-2/neu testing.  Genetic testing:BRCA2 mutation positive  Recommendation: Adjuvant radiation therapy followed by observation Patient will need oophorectomy.  Return to clinic in 3 months for survivorship care plan visit.    No orders of the defined types were placed in this encounter.  The patient has a good understanding of the overall plan. she agrees with it. she will call with any problems that may develop before the next visit here.   Harriette Ohara, MD 01/30/18

## 2018-01-31 ENCOUNTER — Encounter: Payer: Self-pay | Admitting: *Deleted

## 2018-02-01 NOTE — Progress Notes (Signed)
Location of Breast Cancer: Right Breast  Histology per Pathology Report:  07/17/17 Diagnosis 1. Breast, right, needle core biopsy, 9:00 o'clock - INVASIVE DUCTAL CARCINOMA, SEE COMMENT. - DUCTAL CARCINOMA IN SITU WITH NECROSIS. 2. Lymph node, needle/core biopsy, right - METASTATIC CARCINOMA IN ONE LYMPH NODE.  Receptor Status: ER(NEG), PR (NEG), Her2-neu (NEG), Ki-(90%)  01/22/18 Diagnosis 1. Breast, simple mastectomy, Left - USUAL DUCTAL HYPERPLASIA, SCLEROSING ADENOSIS AND FIBROCYSTIC CHANGES - FIBROADENOMATOID NODULES - BENIGN PAPILLOMAS - CALCIFICATIONS - ONE BENIGN INTRAMAMMARY LYMPH NODE (0/1) 2. Breast, simple mastectomy, Right - USUAL DUCTAL HYPERPLASIA AND SCLEROSING ADENOSIS WITH CALCIFICATIONS - FIBROADENOMATOID NODULE - FIBROCYSTIC CHANGES - BENIGN PAPILLOMA - NO RESIDUAL CARCINOMA IDENTIFIED - SEE ONCOLOGY TABLE AND COMMENT BELOW 3. Lymph node, sentinel, biopsy, Right Axillary - NO CARCINOMA IDENTIFIED IN ONE LYMPH NODE (0/1) - SEE COMMENT 4. Lymph node, sentinel, biopsy, Right Axillary #2 - NO CARCINOMA IDENTIFIED IN ONE LYMPH NODE (0/1) 5. Lymph node, sentinel, biopsy, Right - NO CARCINOMA IDENTIFIED IN ONE LYMPH NODE (0/1) 6. Lymph node, sentinel, biopsy, Right - NO CARCINOMA IDENTIFIED IN ONE LYMPH NODE (0/1) 7. Lymph node, sentinel, biopsy, Right - NO CARCINOMA IDENTIFIED IN ONE LYMPH NODE (0/1) 8. Skin , Left Superior Flap - BENIGN SKIN AND SUBCUTANEOUS TISSUE - NO MALIGNANCY IDENTIFIED 9. Skin , Right Superior Flap - BENIGN SKIN AND SUBCUTANEOUS TISSUE - NO MALIGNANCY IDENTIFIED  Did patient present with symptoms or was this found on screening mammography?: She presented with a one month history of palpable lump with associated pain to the area of the upper outer quadrant of the right breast.  Past/Anticipated interventions by surgeon, if any: 01/22/18 PROCEDURE:   Procedure(s): BILATERAL MASTECTOMIES WITH RADIOACTIVE SEED TARGETED RIGHT AXILLARY  [place] NODE EXCISION AND RIGHT SENTINEL LYMPH NODE BIOPSY, injection of methylene blue (1.0 cc), deep sentinel lymph node biopsy REMOVAL PORT-A-CATH  SURGEON:   Alphonsa Overall, M.D   Past/Anticipated interventions by medical oncology, if any: 01/30/18 Dr. Lindi Adie 01/22/2018: Bilateral mastectomies: Right mastectomy: No residual cancer, 0/5 lymph nodes negative; left mastectomy: UDH, FA, no malignancy, ER 0%, PR 0%, HER-2 negative, Ki-67 90%, complete pathologic response  Pathology counseling: I discussed the final pathology report of the patient provided  a copy of this report. I discussed the margins as well as lymph node surgeries. We also discussed the final staging along with previously performed ER/PR and HER-2/neu testing.  Genetic testing:BRCA2 mutation positive  Recommendation: Adjuvant radiation therapy followed by observation Patient will need oophorectomy.  Return to clinic in 6 months with surveillance and follow-up   Lymphedema issues, if any:  She reports some edema and numbness to her Right upper arm.   Pain issues, if any: She reports pain to her upper right arm, her bilateral back, and the drain site from surgery. She rates this a 4/10 and takes hydrocodone every 6 hours.   SAFETY ISSUES:  Prior radiation? No  Pacemaker/ICD? No  Possible current pregnancy? No, she is not having periods.   Is the patient on methotrexate? No  Current Complaints / other details:    BP 130/78   Pulse 99   Temp 98.4 F (36.9 C)   Resp 20   Ht 5' (1.524 m)   Wt 181 lb 3.2 oz (82.2 kg)   LMP 08/19/2015   SpO2 99% Comment: room air  BMI 35.39 kg/m    Wt Readings from Last 3 Encounters:  02/06/18 181 lb 3.2 oz (82.2 kg)  01/30/18 181 lb 3.2 oz (82.2 kg)  01/18/18 184 lb 11.2 oz (83.8 kg)      Scot Shiraishi, Stephani Police, RN 02/01/2018,9:44 AM

## 2018-02-06 ENCOUNTER — Ambulatory Visit
Admission: RE | Admit: 2018-02-06 | Discharge: 2018-02-06 | Disposition: A | Discharge status: Home / Self Care | Payer: 59 | Source: Ambulatory Visit | Attending: Radiation Oncology | Admitting: Radiation Oncology

## 2018-02-06 ENCOUNTER — Encounter: Payer: Self-pay | Admitting: Radiation Oncology

## 2018-02-06 ENCOUNTER — Other Ambulatory Visit: Payer: Self-pay

## 2018-02-06 ENCOUNTER — Encounter: Payer: Self-pay | Admitting: *Deleted

## 2018-02-06 VITALS — BP 130/78 | HR 99 | Temp 98.4°F | Resp 20 | Ht 60.0 in | Wt 181.2 lb

## 2018-02-06 DIAGNOSIS — Z79899 Other long term (current) drug therapy: Secondary | ICD-10-CM | POA: Diagnosis not present

## 2018-02-06 DIAGNOSIS — N6320 Unspecified lump in the left breast, unspecified quadrant: Secondary | ICD-10-CM | POA: Diagnosis not present

## 2018-02-06 DIAGNOSIS — Z1501 Genetic susceptibility to malignant neoplasm of breast: Secondary | ICD-10-CM | POA: Insufficient documentation

## 2018-02-06 DIAGNOSIS — Z171 Estrogen receptor negative status [ER-]: Secondary | ICD-10-CM | POA: Insufficient documentation

## 2018-02-06 DIAGNOSIS — C50411 Malignant neoplasm of upper-outer quadrant of right female breast: Secondary | ICD-10-CM | POA: Diagnosis not present

## 2018-02-06 DIAGNOSIS — Z9221 Personal history of antineoplastic chemotherapy: Secondary | ICD-10-CM | POA: Diagnosis not present

## 2018-02-06 DIAGNOSIS — Z9013 Acquired absence of bilateral breasts and nipples: Secondary | ICD-10-CM | POA: Diagnosis not present

## 2018-02-06 NOTE — Progress Notes (Signed)
[place] Psychosocial Distress Screening Clinical Social Work  Clinical Social Work was referred by distress screening protocol.  The patient scored a 7 on the Psychosocial Distress Thermometer which indicates moderate distress. Clinical Social Worker contacted patient at home to assess for distress and other psychosocial needs.  Patient stated she was doing well and preparing to start radiation.  CSW informed patient of the support team and support services at [facility].  Patient expressed interest and request to be added to the support calendar mailing list.  CSW provided contact information and encouraged patient to call with questions or concerns.       ONCBCN DISTRESS SCREENING 02/06/2018  Screening Type Initial Screening  Distress experienced in past week (1-10) 7  Family Problem type   Emotional problem type Adjusting to illness;Isolation/feeling alone;Adjusting to appearance changes  Information Concerns Type   Physical Problem type Pain;Sleep/insomnia;Swollen arms/legs  Referral to support programs     Johnnye Lana, MSW, LCSW, OSW-C Clinical Social Worker [facility] [phone]

## 2018-02-06 NOTE — Progress Notes (Signed)
Radiation Oncology         [phone] ________________________________  Name: Kara Crawford MRN: [id number]  Date: 02/06/2018  DOB: 1965-11-18  Follow-Up Visit Note  Outpatient  CC: Cari Caraway, MD  Nicholas Lose, MD  Diagnosis:      ICD-10-CM   1. Malignant neoplasm of upper-outer quadrant of right breast in female, estrogen receptor negative ([place]) C50.411 Ambulatory referral to Social Work   Z17.1    Stage IIB Right Breast UOQ Invasive Ductal Carcinoma, ER(-) / PR(-) / Her2(-), Grade 3, [id number]  CHIEF COMPLAINT: Here to discuss management of her right breast cancer  Narrative:  The patient returns today for follow-up.  The patient is BRCA2 mutation positive and is recommended for oophorectomy which is pending; s/p bilateral mastectomy.  Since diagnosis in October 2018, she underwent MRI of her breasts. The right breast mass was 2.0 cm. There was an indeterminate left breast mass. The only abnormal clinical lymph node was a solitary right axillary node which had already been biopsied. The left breast mass was biopsied on 08/08/17 and this showed intraductal papilloma with other benign findings.  The patient completed chemotherapy: dose dense Adriamycin and Cytoxan followed by Taxol under the supervision of Dr. Lindi Adie with complete radiologic response.   The patient then underwent bilateral mastectomy with sentinel lymph node biopsy on 01/22/18 by Dr. Lucia Gaskins. Left breast and one left intramammary lymph node were negative for malignancy. The patient's right breast and all 5 biopsied right lymph nodes were negative for carcinoma. No residual carcinoma was identified.  On review of systems, the patient expects to eventually undergo flap reconstruction and denies expanders. She endorses pain today during her visit. She feels that she is swelling at right axilla. She endorses stretching and decent range of motion on right.           ALLERGIES:  has No Known Allergies.  Meds: Current  Outpatient Medications  Medication Sig Dispense Refill  . acetaminophen (TYLENOL) 500 MG tablet Take 1,000 mg by mouth daily as needed for moderate pain or headache.    Marland Kitchen amLODipine (NORVASC) 10 MG tablet Take 10 mg by mouth every morning.     Marland Kitchen atorvastatin (LIPITOR) 80 MG tablet Take 80 mg by mouth every morning.     . Calcium Carbonate-Vitamin D (CALCIUM 500/D PO) Take 2 tablets by mouth daily.    . Cholecalciferol (VITAMIN D3) 1000 units CAPS Take 5,000 Units by mouth daily.    . diphenhydramine-acetaminophen (TYLENOL PM) 25-500 MG TABS tablet Take 1-2 tablets by mouth at bedtime as needed (sleep).    Marland Kitchen HYDROcodone-acetaminophen (NORCO/VICODIN) 5-325 MG tablet Take 1 tablet by mouth every 6 (six) hours as needed for moderate pain. 20 tablet 0  . lisinopril-hydrochlorothiazide (PRINZIDE,ZESTORETIC) 20-12.5 MG per tablet Take 2 tablets by mouth daily.     . metoprolol succinate (TOPROL-XL) 25 MG 24 hr tablet Take 25 mg by mouth every morning.     . pantoprazole (PROTONIX) 40 MG tablet Take 40 mg by mouth daily.    Vladimir Faster Glycol-Propyl Glycol (SYSTANE OP) Place 1 drop into both eyes daily as needed (dry eyes).    . sitaGLIPtin-metformin (JANUMET) 50-500 MG per tablet Take 1 tablet by mouth 2 (two) times daily with a meal.     . vitamin B-12 (CYANOCOBALAMIN) 1000 MCG tablet Take 2,000 mcg by mouth daily.    . Vitamin D, Ergocalciferol, (DRISDOL) 50000 units CAPS capsule Take 50,000 Units by mouth every Tuesday.     No  current facility-administered medications for this encounter.    Facility-Administered Medications Ordered in Other Encounters  Medication Dose Route Frequency Provider Last Rate Last Dose  . sodium chloride flush (NS) 0.9 % injection 10 mL  10 mL Intravenous PRN Nicholas Lose, MD   10 mL at 08/03/17 1516  . sodium chloride flush (NS) 0.9 % injection 10 mL  10 mL Intravenous PRN Nicholas Lose, MD   10 mL at 08/17/17 1531  . sodium chloride flush (NS) 0.9 % injection 10 mL   10 mL Intravenous PRN Nicholas Lose, MD   10 mL at 10/26/17 1014    Physical Findings:  height is 5' (1.524 m) and weight is 181 lb 3.2 oz (82.2 kg). Her temperature is 98.4 F (36.9 C). Her blood pressure is 130/78 and her pulse is 99. Her respiration is 20 and oxygen saturation is 99%. .     General: Alert and oriented, in no acute distress Neck: Neck is supple, no palpable cervical or supraclavicular lymphadenopathy. Heart: Regular in rate and rhythm with no murmurs, rubs, or gallops. Chest: Clear to auscultation bilaterally, with no rhonchi, wheezes, or rales. Vascular: Left upper chest portacath has been removed. Extremities: No upper extremity edema. Lymphatics: see Neck Exam Psychiatric: Judgment and insight are intact. Affect is appropriate. Breast exam reveals bilaterally mastectomy scars. Drains still in place. She has clear yellow fluid in the JP drains.   Lab Findings: Lab Results  Component Value Date   WBC 3.3 (L) 01/18/2018   HGB 10.9 (L) 01/18/2018   HCT 33.9 (L) 01/18/2018   MCV 100.9 (H) 01/18/2018   PLT 219 01/18/2018      Radiographic Findings: Nm Sentinel Node Inj-no Rpt (breast)  Result Date: 01/22/2018 Sulfur colloid was injected by the nuclear medicine technologist for melanoma sentinel node.    Impression/Plan: right breast cancer  We discussed adjuvant radiotherapy today. It is fantastic that she has a complete response to chemotherapy.  Given the lack of definitive data regarding the safety of holding radiotherapy for patients with a CR to chemotherapy I do recommend radiation to the patient's right chest wall and right supraclavicular region and posterior axilla in order to reduce the risk of locoregional recurrence. It could also carry a benefit in terms of her overall survival.  We discussed the data behind this decision. The risks, benefits and side effects of this treatment were discussed in detail.  She understands that radiotherapy is associated  with skin irritation and fatigue in the acute setting. Late effects can include cosmetic changes and rare injury to internal organs and lymphedema.   She is enthusiastic about proceeding with treatment. A consent form has been signed and placed in her chart. We will schedule CT sim for first week of May so that she has ample time to heal from surgery.  A total of 5 medically necessary complex treatment devices will be fabricated and supervised by me: 4 fields with MLCs for custom blocks to protect heart, and lungs;  and, a Vac-lok. MORE COMPLEX DEVICES MAY BE MADE IN DOSIMETRY FOR FIELD IN FIELD BEAMS FOR DOSE HOMOGENEITY.  I have requested : 3D Simulation which is medically necessary to give adequate dose to at risk tissues while sparing lungs and heart.  I have requested a DVH of the following structures: lungs, heart, esophagus and spinal cord.    The patient will receive 50 Gy in 25 fractions to the right chest wall and 50 Gy in 25 fractions to the right supraclavicular  region and posterior axilla with a total of 4 fields.  This will be followed by a boost.    I will refer to PT due to moderate risk of lymphedema in the future.  I spent 25 minutes face to face with the patient and more than 50% of that time was spent in counseling and/or coordination of care.    _____________________________________   Eppie Gibson, MD  This document serves as a record of services personally performed by Eppie Gibson, MD. It was created on his behalf by Linward Natal, a trained medical scribe. The creation of this record is based on the scribe's personal observations and the provider's statements to them. This document has been checked and approved by the attending provider.

## 2018-02-07 ENCOUNTER — Telehealth: Payer: Self-pay | Admitting: *Deleted

## 2018-02-07 NOTE — Telephone Encounter (Signed)
Called patient to inform of PT appt. on 02-21-18 - arrival time - 10:45 am @ [facility], address - [address], lvm for a return call

## 2018-02-15 ENCOUNTER — Telehealth: Payer: Self-pay | Admitting: Medical Oncology

## 2018-02-15 NOTE — Telephone Encounter (Signed)
Asking if okay for dental cleaning. I told her yes. Last chemo 2/14.

## 2018-02-19 DIAGNOSIS — E119 Type 2 diabetes mellitus without complications: Secondary | ICD-10-CM | POA: Diagnosis not present

## 2018-02-21 ENCOUNTER — Encounter: Payer: Self-pay | Admitting: Physical Therapy

## 2018-02-21 ENCOUNTER — Other Ambulatory Visit: Payer: Self-pay

## 2018-02-21 ENCOUNTER — Ambulatory Visit: Payer: 59 | Attending: Radiation Oncology | Admitting: Physical Therapy

## 2018-02-21 DIAGNOSIS — R293 Abnormal posture: Secondary | ICD-10-CM | POA: Diagnosis not present

## 2018-02-21 DIAGNOSIS — M25611 Stiffness of right shoulder, not elsewhere classified: Secondary | ICD-10-CM

## 2018-02-21 DIAGNOSIS — M6281 Muscle weakness (generalized): Secondary | ICD-10-CM | POA: Diagnosis not present

## 2018-02-21 NOTE — Therapy (Signed)
[place], [place], [address] Phone: [phone]   Fax:  [phone]  Physical Therapy Evaluation  Patient Details  Name: Kara Crawford MRN: [id number] Date of Birth: Apr 08, 1966 Referring Provider: Dr. Isidore Moos   Encounter Date: 02/21/2018  PT End of Session - 02/21/18 1205    Visit Number  1    Number of Visits  9    Date for PT Re-Evaluation  03/21/18    PT Start Time  1103    PT Stop Time  1145    PT Time Calculation (min)  42 min    Activity Tolerance  Patient tolerated treatment well    Behavior During Therapy  [facility] for tasks assessed/performed       Past Medical History:  Diagnosis Date  . Anemia    anemia prior to uterine emboliztion procedure.  . Breast cancer ([place])   . Diabetes mellitus without complication ([place])    type 2  . Family history of breast cancer   . Family history of ovarian cancer   . Family history of prostate cancer   . Fibroids   . Headache   . Heart murmur   . Hyperlipidemia   . Hypertension     Past Surgical History:  Procedure Laterality Date  . BREATH TEK H PYLORI N/A 04/17/2014   Procedure: BREATH TEK H PYLORI;  Surgeon: Shann Medal, MD;  Location: [facility];  Service: General;  Laterality: N/A;  . CESAREAN SECTION     x3- normal development  . ESOPHAGOGASTRODUODENOSCOPY N/A 08/20/2015   Procedure: ESOPHAGOGASTRODUODENOSCOPY (EGD);  Surgeon: Alphonsa Overall, MD;  Location: [facility];  Service: General;  Laterality: N/A;  . EXPLORATORY LAPAROTOMY  05/13/2015  . GASTRIC ROUX-EN-Y N/A 10/06/2014   Procedure: LAPAROSCOPIC ROUX-EN-Y GASTRIC BYPASS WITH UPPER ENDOSCOPY;  Surgeon: Pedro Earls, MD;  Location: [facility];  Service: General;  Laterality: N/A;  . LAPAROSCOPY N/A 05/12/2015   Procedure: LAPAROSCOPY REPAIR OF PERFORATED BOWEL;  Surgeon: Alphonsa Overall, MD;  Location: [facility];  Service: General;  Laterality: N/A;  . MASTECTOMY WITH RADIOACTIVE SEED GUIDED  EXCISION AND AXILLARY SENTINEL LYMPH NODE BIOPSY Bilateral 01/22/2018   Procedure: BILATERAL MASTECTOMIES WITH RADIOACTIVE SEED TARGETED RIGHT AXILLARY [place] NODE EXCISION AND RIGHT SENTINEL LYMPH NODE BIOPSY;  Surgeon: Alphonsa Overall, MD;  Location: [facility];  Service: General;  Laterality: Bilateral;  . PORT-A-CATH REMOVAL Left 01/22/2018   Procedure: REMOVAL PORT-A-CATH;  Surgeon: Alphonsa Overall, MD;  Location: [facility];  Service: General;  Laterality: Left;  . PORTACATH PLACEMENT N/A 08/02/2017   Procedure: INSERTION PORT-A-CATH;  Surgeon: Alphonsa Overall, MD;  Location: [facility];  Service: General;  Laterality: N/A;  . UTERINE ARTERY EMBOLIZATION  08-16-2010    There were no vitals filed for this visit.   Subjective Assessment - 02/21/18 1107    Subjective  I am starting to get feeling back in my right arm. I have trouble trying to reach things but it has gotten better. I have been doing the wall walking and stretches. I have been doing the post op breast exercises.     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 It measures 1 cm and is located in the upper outer quadrant with a Ki67 of 90%. She had 3 abnormal looking axillary nodes on ultrasound and 1 was biopsied and found to be positive, 01/22/18- underwent bilateral mastectomy and a SLNB on the right  side with 6/6 were negative, pt has completed chemotherapy 12/14/17, pt is going to start radiation next week    Patient Stated Goals  to get full ROM in the right arm    Currently in Pain?  No/denies    Pain Score  0-No pain         [facility] PT Assessment - 02/21/18 0001      Assessment   Medical Diagnosis  Right breast cancer    Referring Provider  Dr. Isidore Moos    Onset Date/Surgical Date  01/22/18    Hand Dominance  Right    Prior Therapy  received therapy for her right shoulder prior to cancer diagnosis      Precautions   Precautions  Other  (comment)    Precaution Comments  at risk for lymphedema      Restrictions   Weight Bearing Restrictions  No      Balance Screen   Has the patient fallen in the past 6 months  No    Has the patient had a decrease in activity level because of a fear of falling?   No    Is the patient reluctant to leave their home because of a fear of falling?   No      Home Environment   Living Environment  Private residence    Living Arrangements  Spouse/significant other;Children Husband and 52, 56, 58  y.o. kids    Available Help at Discharge  Family    Type of [place] to enter    Entrance Stairs-Number of Steps  4    Entrance Stairs-Rails  Can reach both    Home Layout  Two level    Alternate Level Stairs-Number of Steps  14    Alternate Level Stairs-Rails  Left      Prior Function   Level of Independence  Independent    Vocation  Unemployed    Leisure  pt reports she has not been exercising recently but plans on returning to the gym on Monday      Cognition   Overall Cognitive Status  Within Functional Limits for tasks assessed      Posture/Postural Control   Posture/Postural Control  Postural limitations    Postural Limitations  Rounded Shoulders;Forward head      AROM   Overall AROM Comments  --    Right Shoulder Extension  --    Right Shoulder Flexion  137 Degrees    Right Shoulder ABduction  97 Degrees    Right Shoulder Internal Rotation  45 Degrees    Right Shoulder External Rotation  60 Degrees    Left Shoulder Extension  --    Left Shoulder Flexion  155 Degrees    Left Shoulder ABduction  170 Degrees    Left Shoulder Internal Rotation  60 Degrees    Left Shoulder External Rotation  90 Degrees    Cervical Flexion  --    Cervical Extension  --    Cervical - Right Side Bend  --    Cervical - Left Side Bend  --    Cervical - Right Rotation  --    Cervical - Left Rotation  --      Strength   Overall Strength  --      Palpation   Palpation  comment  --      Special Tests    Special Tests  --    Rotator Cuff Impingment tests  --  Empty Can test   Findings  --    Side  --      Drop Arm test   Side  --      Painful Arc of Motion   Side  --        LYMPHEDEMA/ONCOLOGY QUESTIONNAIRE - 02/21/18 1124      Type   Cancer Type  Right breast cancer      Surgeries   Mastectomy Date  01/22/18    Saline Implant Reconstruction Date  -- pt plans on doing the abdominal flap reconstruction in Octob    Sentinel Lymph Node Biopsy Date  01/22/18    Number Lymph Nodes Removed  6      Treatment   Active Chemotherapy Treatment  No    Past Chemotherapy Treatment  Yes    Date  12/14/17    Active Radiation Treatment  No will begin next week    Past Radiation Treatment  No    Current Hormone Treatment  No    Past Hormone Therapy  No      What other symptoms do you have   Are you Having Heaviness or Tightness  Yes    Are you having Pain  No    Are you having pitting edema  No    Is it Hard or Difficult finding clothes that fit  No    Do you have infections  No    Is there Decreased scar mobility  Yes      Lymphedema Assessments   Lymphedema Assessments  Upper extremities      Right Upper Extremity Lymphedema   10 cm Proximal to Olecranon Process  30.5 cm    Olecranon Process  26 cm    10 cm Proximal to Ulnar Styloid Process  23 cm    Just Proximal to Ulnar Styloid Process  17.5 cm    Across Hand at [employer]  20.5 cm    At [place] of 2nd Digit  6.5 cm      Left Upper Extremity Lymphedema   10 cm Proximal to Olecranon Process  32 cm    Olecranon Process  26 cm    10 cm Proximal to Ulnar Styloid Process  23.9 cm    Just Proximal to Ulnar Styloid Process  17 cm    Across Hand at [employer]  19 cm    At [place] of 2nd Digit  6.5 cm             Objective measurements completed on examination: See above findings.              PT Education - 02/21/18 1204    Education provided  Yes     Education Details  lymphedema risk reduction, anatomy and physiology of lymphatic system, ABC class    Person(s) Educated  Patient    Methods  Explanation    Comprehension  Verbalized understanding          PT Long Term Goals - 02/21/18 1213      PT LONG TERM GOAL #1   Title  Pt will be able to independently verbalize lymphedema risk reduction practices    Time  4    Period  Weeks    Status  New    Target Date  03/21/18      PT LONG TERM GOAL #2   Title  Pt will demonstrate improved right shoulder abduction of at least 160 degrees for increased ease of obtaining position  for radiation    Baseline  97    Time  4    Period  Weeks    Status  New    Target Date  03/21/18      PT LONG TERM GOAL #3   Title  Pt will demonstrate improved right shoulder flexion to greater than 150 degrees to allow her to reach items on the top shelf    Baseline  137    Time  4    Period  Weeks    Status  New    Target Date  03/21/18      PT LONG TERM GOAL #4   Title  Pt will obtain a prophylactic compression sleeve and gauntlet from DME supplier to wear when she flies    Time  4    Period  Weeks    Status  New    Target Date  03/21/18      PT LONG TERM GOAL #5   Title  Pt will be independent in a home exercise program for continued strengthening and stretching    Time  4    Period  Weeks    Status  New    Target Date  03/21/18      Breast Clinic Goals - 07/26/17 1125      Patient will be able to verbalize understanding of pertinent lymphedema risk reduction practices relevant to her diagnosis specifically related to skin care.   Time  1    Period  Days    Status  Achieved      Patient will be able to return demonstrate and/or verbalize understanding of the post-op home exercise program related to regaining shoulder range of motion.   Time  1    Period  Days    Status  Achieved      Patient will be able to verbalize understanding of the importance of attending the postoperative  After Breast Cancer Class for further lymphedema risk reduction education and therapeutic exercise.   Time  1    Period  Days    Status  Achieved       Long Term Clinic Goals - 07/26/17 1126      CC Long Term Goal  #1   Title  Patient will be independent in a home exercise program to improve shoulder ROM.    Time  4    Period  Weeks    Status  New      CC Long Term Goal  #2   Title  Increase right shoulder active flexion to >/= 144 degrees for increased ease of reaching.    Time  4    Period  Weeks    Status  New      CC Long Term Goal  #3   Title  Increase right shoulder abduction to >/= 160 degrees for increased ease of reaching and to obtain required radiation positioning.    Time  4    Period  Weeks    Status  New      CC Long Term Goal  #4   Title  Patient will report >/= 50% decrease in right shoulder pain to tolerate daily tasks with greater ease.    Time  4    Period  Weeks    Status  New          Plan - 02/21/18 1207    Clinical Impression Statement  Pt presents to PT with decreased right shoulder ROM following a bilateral mastectomy  on 01/22/18 for treatment of right breast cancer. She has completed chemotherapy and is going to be beginning radiation next week. She is limited with flexion, abduction, IR and ER on the right side. She also reports increased tightness along her scar line and in right pectoralis muscle. Pt would benefit from skilled PT services to increase right shoulder ROM and strength, decrease right pec tightness and improve scar mobility.     History and Personal Factors relevant to plan of care:  pt is right handed    Clinical Presentation  Evolving    Clinical Presentation due to:  pt to begin radiation next week    Clinical Decision Making  Moderate    Rehab Potential  Good    Clinical Impairments Affecting Rehab Potential  pt to begin radiation    PT Frequency  2x / week    PT Duration  4 weeks    PT Treatment/Interventions   Patient/family education;Passive range of motion;Manual techniques;Taping;Therapeutic exercise;Therapeutic activities;ADLs/Self Care Home Management;Scar mobilization    PT Next Visit Plan  give dowel exercises, begin gentle AA/A/PROM to R shoulder    PT Home Exercise Plan  post op breast exercises    Recommended Other Services  ABC class on May 6    Consulted and Agree with Plan of Care  Patient       Patient will benefit from skilled therapeutic intervention in order to improve the following deficits and impairments:  Decreased range of motion, Impaired UE functional use, Pain, Decreased knowledge of precautions, Postural dysfunction, Decreased strength, Increased fascial restricitons  Visit Diagnosis: Stiffness of right shoulder, not elsewhere classified - Plan: PT plan of care cert/re-cert  Muscle weakness (generalized) - Plan: PT plan of care cert/re-cert  Abnormal posture - Plan: PT plan of care cert/re-cert     Problem List Patient Active Problem List   Diagnosis Date Noted  . Cancer of right breast, stage 2 ([place]) 01/22/2018  . Genetic testing 08/18/2017  . BRCA2 positive 08/18/2017  . Family history of breast cancer   . Family history of ovarian cancer   . Family history of prostate cancer   . Malignant neoplasm of upper-outer quadrant of right breast in female, estrogen receptor negative ([place]) 07/25/2017  . Perforated bowel ([place]) 05/13/2015  . Free intraperitoneal air 05/12/2015  . Submucosal lesion of esophagus 10/06/2014  . 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 Diabetes ([place]) 09/24/2014  . Morbid obesity ([place]) 03/20/2014    Allyson Sabal [facility] 02/21/2018, 12:24 PM  [place], [place], [address] Phone: [phone]   Fax:  [phone]  Name: Kara Crawford MRN: [id number] Date of Birth: [birth year]  Manus Gunning, PT 02/21/18 12:24  PM

## 2018-02-26 ENCOUNTER — Telehealth (HOSPITAL_COMMUNITY): Payer: Self-pay | Admitting: Vascular Surgery

## 2018-02-26 NOTE — Telephone Encounter (Signed)
left Pt message giving 6 month brst f/u w/ echo in 7/1 @ 9 for echo 10:20 to see DB, asked pt to call back to confirm

## 2018-02-27 ENCOUNTER — Ambulatory Visit
Admission: RE | Admit: 2018-02-27 | Discharge: 2018-02-27 | Disposition: A | Discharge status: Home / Self Care | Payer: 59 | Source: Ambulatory Visit | Attending: Radiation Oncology | Admitting: Radiation Oncology

## 2018-02-27 DIAGNOSIS — C50411 Malignant neoplasm of upper-outer quadrant of right female breast: Secondary | ICD-10-CM | POA: Diagnosis present

## 2018-02-27 DIAGNOSIS — Z171 Estrogen receptor negative status [ER-]: Secondary | ICD-10-CM | POA: Insufficient documentation

## 2018-02-27 DIAGNOSIS — Z510 Encounter for antineoplastic radiation therapy: Secondary | ICD-10-CM | POA: Diagnosis not present

## 2018-02-27 NOTE — Progress Notes (Signed)
  Radiation Oncology         [phone]) [phone] ________________________________  Name: Kara Crawford MRN: [id number]  Date: 02/27/2018  DOB: [birth year]  SIMULATION AND TREATMENT PLANNING NOTE    Outpatient  DIAGNOSIS:     ICD-10-CM   1. Malignant neoplasm of upper-outer quadrant of right breast in female, estrogen receptor negative ([place]) C50.411    Z17.1     NARRATIVE:  The patient was brought to the [facility].  Identity was confirmed.  All relevant records and images related to the planned course of therapy were reviewed.  The patient freely provided informed written consent to proceed with treatment after reviewing the details related to the planned course of therapy. The consent form was witnessed and verified by the simulation staff.    Then, the patient was set-up in a stable reproducible supine position for radiation therapy with her ipsilateral arm over her head, and her upper body secured in a custom-made Vac-lok device.  CT images were obtained.  Surface markings were placed.  The CT images were loaded into the planning software.    TREATMENT PLANNING NOTE: Treatment planning then occurred.  The radiation prescription was entered and confirmed.     A total of 5 medically necessary complex treatment devices were fabricated and supervised by me: 4 fields with MLCs for custom blocks to protect heart, and lungs;  and, a Vac-lok. MORE COMPLEX DEVICES MAY BE MADE IN DOSIMETRY FOR FIELD IN FIELD BEAMS FOR DOSE HOMOGENEITY.  I have requested : 3D Simulation which is medically necessary to give adequate dose to at risk tissues while sparing lungs and heart.  I have requested a DVH of the following structures: lungs, heart, esophagus, cord.    The patient will receive 50 Gy in 25 fractions to the right chest wall and PAB/SCV nodes with 4 fields.  This will be followed by a boost.  Optical Surface Tracking Plan:  Since intensity modulated radiotherapy (IMRT) and 3D conformal  radiation treatment methods are predicated on accurate and precise positioning for treatment, intrafraction motion monitoring is medically necessary to ensure accurate and safe treatment delivery. The ability to quantify intrafraction motion without excessive ionizing radiation dose can only be performed with optical surface tracking. Accordingly, surface imaging offers the opportunity to obtain 3D measurements of patient position throughout IMRT and 3D treatments without excessive radiation exposure. I am ordering optical surface tracking for this patient's upcoming course of radiotherapy.  ________________________________   Reference:  Ursula Alert, J, et al. Surface imaging-based analysis of intrafraction motion for breast radiotherapy patients.Journal of [place], n. 6, nov. 2014. ISSN [id number].  Available at: <http://www.jacmp.org/index.php/jacmp/article/view/4957>.    -----------------------------------  Eppie Gibson, MD

## 2018-03-01 ENCOUNTER — Encounter: Payer: Self-pay | Admitting: Physical Therapy

## 2018-03-01 ENCOUNTER — Ambulatory Visit: Payer: 59 | Attending: Radiation Oncology | Admitting: Physical Therapy

## 2018-03-01 DIAGNOSIS — M6281 Muscle weakness (generalized): Secondary | ICD-10-CM | POA: Insufficient documentation

## 2018-03-01 DIAGNOSIS — R293 Abnormal posture: Secondary | ICD-10-CM

## 2018-03-01 DIAGNOSIS — M25511 Pain in right shoulder: Secondary | ICD-10-CM | POA: Insufficient documentation

## 2018-03-01 DIAGNOSIS — M25611 Stiffness of right shoulder, not elsewhere classified: Secondary | ICD-10-CM | POA: Diagnosis present

## 2018-03-01 NOTE — Patient Instructions (Signed)
Shoulder: Flexion (Supine)    With hands shoulder width apart, slowly lower dowel to floor behind head. Do not let elbows bend. Keep back flat. Keep shoulder blades squeezed together to decrease shoulder discomfort. Hold _10-15___ seconds. Repeat _10___ times. Do __1-2__ sessions per day. CAUTION: Stretch slowly and gently.  Copyright  VHI. All rights reserved.  Shoulder: Abduction (Supine)    With right arm flat on floor, hold dowel in palm. Slowly move arm up to side of head by pushing with opposite arm. Do not let elbow bend. Hold _10-15___ seconds. Repeat _10___ times. Do _1-2___ sessions per day. CAUTION: Stretch slowly and gently.  Copyright  VHI. All rights reserved.

## 2018-03-01 NOTE — Therapy (Signed)
[place], [place], [address] Phone: [phone]   Fax:  [phone]  Physical Therapy Treatment  Patient Details  Name: Kara Crawford MRN: [id number] Date of Birth: September 07, 1966 Referring Provider: Dr. Isidore Moos   Encounter Date: 03/01/2018  PT End of Session - 03/01/18 1152    Visit Number  2    Number of Visits  9    Date for PT Re-Evaluation  03/21/18    PT Start Time  1103    PT Stop Time  1146    PT Time Calculation (min)  43 min    Activity Tolerance  Patient tolerated treatment well    Behavior During Therapy  [facility] for tasks assessed/performed       Past Medical History:  Diagnosis Date  . Anemia    anemia prior to uterine emboliztion procedure.  . Breast cancer ([place])   . Diabetes mellitus without complication ([place])    type 2  . Family history of breast cancer   . Family history of ovarian cancer   . Family history of prostate cancer   . Fibroids   . Headache   . Heart murmur   . Hyperlipidemia   . Hypertension     Past Surgical History:  Procedure Laterality Date  . BREATH TEK H PYLORI N/A 04/17/2014   Procedure: BREATH TEK H PYLORI;  Surgeon: Shann Medal, MD;  [facility];  Service: General;  Laterality: N/A;  . CESAREAN SECTION     x3- normal development  . ESOPHAGOGASTRODUODENOSCOPY N/A 08/20/2015   Procedure: ESOPHAGOGASTRODUODENOSCOPY (EGD);  Surgeon: Alphonsa Overall, MD;  Location: [facility];  Service: General;  Laterality: N/A;  . EXPLORATORY LAPAROTOMY  05/13/2015  . GASTRIC ROUX-EN-Y N/A 10/06/2014   Procedure: LAPAROSCOPIC ROUX-EN-Y GASTRIC BYPASS WITH UPPER ENDOSCOPY;  Surgeon: Pedro Earls, MD;  [facility];  Service: General;  Laterality: N/A;  . LAPAROSCOPY N/A 05/12/2015   Procedure: LAPAROSCOPY REPAIR OF PERFORATED BOWEL;  Surgeon: Alphonsa Overall, MD;  Location: [facility];  Service: General;  Laterality: N/A;  . MASTECTOMY WITH RADIOACTIVE SEED GUIDED  EXCISION AND AXILLARY SENTINEL LYMPH NODE BIOPSY Bilateral 01/22/2018   Procedure: BILATERAL MASTECTOMIES WITH RADIOACTIVE SEED TARGETED RIGHT AXILLARY [place] NODE EXCISION AND RIGHT SENTINEL LYMPH NODE BIOPSY;  Surgeon: Alphonsa Overall, MD;  Location: [facility];  Service: General;  Laterality: Bilateral;  . PORT-A-CATH REMOVAL Left 01/22/2018   Procedure: REMOVAL PORT-A-CATH;  Surgeon: Alphonsa Overall, MD;  Location: [place];  Service: General;  Laterality: Left;  . PORTACATH PLACEMENT N/A 08/02/2017   Procedure: INSERTION PORT-A-CATH;  Surgeon: Alphonsa Overall, MD;  Location: [place];  Service: General;  Laterality: N/A;  . UTERINE ARTERY EMBOLIZATION  08-16-2010    There were no vitals filed for this visit.  Subjective Assessment - 03/01/18 1105    Subjective  My shoulder feels good today but I have trouble when I try to raise it.     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 It measures 1 cm and is located in the upper outer quadrant with a Ki67 of 90%. She had 3 abnormal looking axillary nodes on ultrasound and 1 was biopsied and found to be positive, 01/22/18- underwent bilateral mastectomy and a SLNB on the right side with 6/6 were negative, pt has completed chemotherapy 12/14/17, pt is going to start radiation next week    Patient Stated Goals  to get  full ROM in the right arm    Currently in Pain?  No/denies    Pain Score  0-No pain                       [facility] Adult PT Treatment/Exercise - 03/01/18 0001      Shoulder Exercises: Supine   Flexion  AAROM;Both;10 reps with dowel with 10 sec holds- pt returned therapist demo    ABduction  AAROM;Right;Limitations 7 reps w/dowel, required tactile cues, stopped due to P!      Manual Therapy   Manual Therapy  Scapular mobilization;Passive ROM;Muscle Energy Technique    Scapular Mobilization  in left sidelying to right scapula in direction  of retraction and protraction while gently moving arm into flexion    Passive ROM  to R shoulder in supine gently in direction of flexion, abduction, and ER to pt's tolerance- pt had increased shoulder discomfort with abduction    Muscle Energy Technique  contract relax in supine to right shoulder in to ER with slow gains in ROM noted and decreased shoulder discomfort                  PT Long Term Goals - 02/21/18 1213      PT LONG TERM GOAL #1   Title  Pt will be able to independently verbalize lymphedema risk reduction practices    Time  4    Period  Weeks    Status  New    Target Date  03/21/18      PT LONG TERM GOAL #2   Title  Pt will demonstrate improved right shoulder abduction of at least 160 degrees for increased ease of obtaining position for radiation    Baseline  97    Time  4    Period  Weeks    Status  New    Target Date  03/21/18      PT LONG TERM GOAL #3   Title  Pt will demonstrate improved right shoulder flexion to greater than 150 degrees to allow her to reach items on the top shelf    Baseline  137    Time  4    Period  Weeks    Status  New    Target Date  03/21/18      PT LONG TERM GOAL #4   Title  Pt will obtain a prophylactic compression sleeve and gauntlet from DME supplier to wear when she flies    Time  4    Period  Weeks    Status  New    Target Date  03/21/18      PT LONG TERM GOAL #5   Title  Pt will be independent in a home exercise program for continued strengthening and stretching    Time  4    Period  Weeks    Status  New    Target Date  03/21/18            Plan - 03/01/18 1153    Clinical Impression Statement  Pt had pain with R shoulder abduction today with dowel exercises. Educated pt to not do these at home if she continues to have pain in the joint. Today performed scapular mobilization to right scapula and pt had some decrease in pain in right shoulder during abduction. She demonstrated increased right shoulder  flexion ROM by end of session today.     Rehab Potential  Good    Clinical Impairments Affecting  Rehab Potential  pt to begin radiation    PT Frequency  2x / week    PT Duration  4 weeks    PT Treatment/Interventions  Patient/family education;Passive range of motion;Manual techniques;Taping;Therapeutic exercise;Therapeutic activities;ADLs/Self Care Home Management;Scar mobilization    PT Next Visit Plan  see if pt had pain with supine dowel abduction exercise, continue gentle AA/A/PROM To R shoulder, do pulleys and ball next session    PT Home Exercise Plan  post op breast exercises    Consulted and Agree with Plan of Care  Patient       Patient will benefit from skilled therapeutic intervention in order to improve the following deficits and impairments:  Decreased range of motion, Impaired UE functional use, Pain, Decreased knowledge of precautions, Postural dysfunction, Decreased strength, Increased fascial restricitons  Visit Diagnosis: Stiffness of right shoulder, not elsewhere classified  Muscle weakness (generalized)  Abnormal posture     Problem List Patient Active Problem List   Diagnosis Date Noted  . Cancer of right breast, stage 2 ([place]) 01/22/2018  . Genetic testing 08/18/2017  . BRCA2 positive 08/18/2017  . Family history of breast cancer   . Family history of ovarian cancer   . Family history of prostate cancer   . Malignant neoplasm of upper-outer quadrant of right breast in female, estrogen receptor negative ([place]) 07/25/2017  . Perforated bowel ([place]) 05/13/2015  . Free intraperitoneal air 05/12/2015  . Submucosal lesion of esophagus 10/06/2014  . 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 Diabetes ([place]) 09/24/2014  . Morbid obesity ([place]) 03/20/2014    Allyson Sabal [facility] 03/01/2018, 11:56 AM  [place], [place], [address] Phone:  [phone]   Fax:  [phone]  Name: Isebella Upshur MRN: [id number] Date of Birth: 1966-07-13  Manus Gunning, PT 03/01/18 11:56 AM

## 2018-03-02 ENCOUNTER — Encounter: Payer: Self-pay | Admitting: Physical Therapy

## 2018-03-02 ENCOUNTER — Ambulatory Visit: Payer: 59 | Admitting: Physical Therapy

## 2018-03-02 DIAGNOSIS — M25611 Stiffness of right shoulder, not elsewhere classified: Secondary | ICD-10-CM

## 2018-03-02 DIAGNOSIS — M25511 Pain in right shoulder: Secondary | ICD-10-CM

## 2018-03-02 DIAGNOSIS — R293 Abnormal posture: Secondary | ICD-10-CM

## 2018-03-02 NOTE — Therapy (Signed)
[place], [place], [address] Phone: [phone]   Fax:  [phone]  Physical Therapy Treatment  Patient Details  Name: Kara Crawford MRN: [id number] Date of Birth: 03/21/1966 Referring Provider: Dr. Isidore Moos   Encounter Date: 03/02/2018  PT End of Session - 03/02/18 0932    Visit Number  3    Number of Visits  9    Date for PT Re-Evaluation  03/21/18    PT Start Time  0849    PT Stop Time  0930    PT Time Calculation (min)  41 min    Activity Tolerance  Patient tolerated treatment well    Behavior During Therapy  [facility] for tasks assessed/performed       Past Medical History:  Diagnosis Date  . Anemia    anemia prior to uterine emboliztion procedure.  . Breast cancer ([place])   . Diabetes mellitus without complication ([place])    type 2  . Family history of breast cancer   . Family history of ovarian cancer   . Family history of prostate cancer   . Fibroids   . Headache   . Heart murmur   . Hyperlipidemia   . Hypertension     Past Surgical History:  Procedure Laterality Date  . BREATH TEK H PYLORI N/A 04/17/2014   Procedure: BREATH TEK H PYLORI;  Surgeon: Shann Medal, MD;  Location: [facility];  Service: General;  Laterality: N/A;  . CESAREAN SECTION     x3- normal development  . ESOPHAGOGASTRODUODENOSCOPY N/A 08/20/2015   Procedure: ESOPHAGOGASTRODUODENOSCOPY (EGD);  Surgeon: Alphonsa Overall, MD;  Location: [facility];  Service: General;  Laterality: N/A;  . EXPLORATORY LAPAROTOMY  05/13/2015  . GASTRIC ROUX-EN-Y N/A 10/06/2014   Procedure: LAPAROSCOPIC ROUX-EN-Y GASTRIC BYPASS WITH UPPER ENDOSCOPY;  Surgeon: Pedro Earls, MD;  [facility];  Service: General;  Laterality: N/A;  . LAPAROSCOPY N/A 05/12/2015   Procedure: LAPAROSCOPY REPAIR OF PERFORATED BOWEL;  Surgeon: Alphonsa Overall, MD;  Location: [place];  Service: General;  Laterality: N/A;  . MASTECTOMY WITH RADIOACTIVE SEED GUIDED  EXCISION AND AXILLARY SENTINEL LYMPH NODE BIOPSY Bilateral 01/22/2018   Procedure: BILATERAL MASTECTOMIES WITH RADIOACTIVE SEED TARGETED RIGHT AXILLARY [place] NODE EXCISION AND RIGHT SENTINEL LYMPH NODE BIOPSY;  Surgeon: Alphonsa Overall, MD;  Location: [facility];  Service: General;  Laterality: Bilateral;  . PORT-A-CATH REMOVAL Left 01/22/2018   Procedure: REMOVAL PORT-A-CATH;  Surgeon: Alphonsa Overall, MD;  Location: [facility];  Service: General;  Laterality: Left;  . PORTACATH PLACEMENT N/A 08/02/2017   Procedure: INSERTION PORT-A-CATH;  Surgeon: Alphonsa Overall, MD;  Location: [facility];  Service: General;  Laterality: N/A;  . UTERINE ARTERY EMBOLIZATION  08-16-2010    There were no vitals filed for this visit.  Subjective Assessment - 03/02/18 0851    Subjective  My shoulder felt good after yesterday. It wasn't sore. It does not seem as tight.     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 It measures 1 cm and is located in the upper outer quadrant with a Ki67 of 90%. She had 3 abnormal looking axillary nodes on ultrasound and 1 was biopsied and found to be positive, 01/22/18- underwent bilateral mastectomy and a SLNB on the right side with 6/6 were negative, pt has completed chemotherapy 12/14/17, pt is going to start radiation next week    Patient Stated Goals  to get  full ROM in the right arm    Currently in Pain?  No/denies    Pain Score  0-No pain         [facility] PT Assessment - 03/02/18 0001      AROM   Right Shoulder Flexion  147 Degrees    Right Shoulder ABduction  120 Degrees                   [facility] Adult PT Treatment/Exercise - 03/02/18 0001      Shoulder Exercises: Pulleys   Flexion  2 minutes with verbal cues for good posture    ABduction  2 minutes returned therapist demonstration      Shoulder Exercises: Therapy Ball   Flexion  Both;10 reps returned therapist demonstration     ABduction  Right;10 reps returned therapist demonstration      Manual Therapy   Manual Therapy  Scapular mobilization;Passive ROM;Muscle Energy Technique    Scapular Mobilization  in left sidelying to right scapula in direction of retraction and protraction, depression and elevation while gently moving arm into flexion    Passive ROM  to R shoulder in supine gently in direction of flexion, abduction, and ER to pt's tolerance- pt had less discomfort today with abduction                  PT Long Term Goals - 02/21/18 1213      PT LONG TERM GOAL #1   Title  Pt will be able to independently verbalize lymphedema risk reduction practices    Time  4    Period  Weeks    Status  New    Target Date  03/21/18      PT LONG TERM GOAL #2   Title  Pt will demonstrate improved right shoulder abduction of at least 160 degrees for increased ease of obtaining position for radiation    Baseline  97    Time  4    Period  Weeks    Status  New    Target Date  03/21/18      PT LONG TERM GOAL #3   Title  Pt will demonstrate improved right shoulder flexion to greater than 150 degrees to allow her to reach items on the top shelf    Baseline  137    Time  4    Period  Weeks    Status  New    Target Date  03/21/18      PT LONG TERM GOAL #4   Title  Pt will obtain a prophylactic compression sleeve and gauntlet from DME supplier to wear when she flies    Time  4    Period  Weeks    Status  New    Target Date  03/21/18      PT LONG TERM GOAL #5   Title  Pt will be independent in a home exercise program for continued strengthening and stretching    Time  4    Period  Weeks    Status  New    Target Date  03/21/18            Plan - 03/02/18 0933    Clinical Impression Statement  Added AAROM exercises today and pt did not have pain with these and demonstrated improving ROM. Re assessed ROM today and pt demonstrates 23 degrees more abduction and 10 degrees more flexion. Continued  with scapular mobilization. Her scapular mobility is limited on the right which is  the reason pt is having pain during abduction. Following scapular mobilization pt has decreased pain with abduction.     Rehab Potential  Good    Clinical Impairments Affecting Rehab Potential  pt to begin radiation    PT Frequency  2x / week    PT Duration  4 weeks    PT Treatment/Interventions  Patient/family education;Passive range of motion;Manual techniques;Taping;Therapeutic exercise;Therapeutic activities;ADLs/Self Care Home Management;Scar mobilization    PT Next Visit Plan  continue gentle AA/A/PROM To R shoulder, continue pulleys and ball and scap mobs    PT Home Exercise Plan  post op breast exercises, supine dowel    Consulted and Agree with Plan of Care  Patient       Patient will benefit from skilled therapeutic intervention in order to improve the following deficits and impairments:  Decreased range of motion, Impaired UE functional use, Pain, Decreased knowledge of precautions, Postural dysfunction, Decreased strength, Increased fascial restricitons  Visit Diagnosis: Stiffness of right shoulder, not elsewhere classified  Abnormal posture  Acute pain of right shoulder     Problem List Patient Active Problem List   Diagnosis Date Noted  . Cancer of right breast, stage 2 ([place]) 01/22/2018  . Genetic testing 08/18/2017  . BRCA2 positive 08/18/2017  . Family history of breast cancer   . Family history of ovarian cancer   . Family history of prostate cancer   . Malignant neoplasm of upper-outer quadrant of right breast in female, estrogen receptor negative ([place]) 07/25/2017  . Perforated bowel ([place]) 05/13/2015  . Free intraperitoneal air 05/12/2015  . Submucosal lesion of esophagus 10/06/2014  . 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 Diabetes ([place]) 09/24/2014  . Morbid obesity ([place]) 03/20/2014    Allyson Sabal [facility] 03/02/2018, 9:35  AM  [place], [place], [address] Phone: [phone]   Fax:  [phone]  Name: Kara Crawford MRN: [id number] Date of Birth: January 01, 1966  Manus Gunning, PT 03/02/18 9:35 AM

## 2018-03-05 ENCOUNTER — Encounter: Payer: Self-pay | Admitting: Physical Therapy

## 2018-03-05 ENCOUNTER — Ambulatory Visit: Payer: 59 | Admitting: Physical Therapy

## 2018-03-05 DIAGNOSIS — Z170 Estrogen receptor positive status [ER+]: Secondary | ICD-10-CM | POA: Diagnosis not present

## 2018-03-05 DIAGNOSIS — M25611 Stiffness of right shoulder, not elsewhere classified: Secondary | ICD-10-CM

## 2018-03-05 DIAGNOSIS — Z510 Encounter for antineoplastic radiation therapy: Secondary | ICD-10-CM | POA: Insufficient documentation

## 2018-03-05 DIAGNOSIS — C50411 Malignant neoplasm of upper-outer quadrant of right female breast: Secondary | ICD-10-CM | POA: Diagnosis not present

## 2018-03-05 DIAGNOSIS — M25511 Pain in right shoulder: Secondary | ICD-10-CM

## 2018-03-05 DIAGNOSIS — M6281 Muscle weakness (generalized): Secondary | ICD-10-CM

## 2018-03-05 DIAGNOSIS — R293 Abnormal posture: Secondary | ICD-10-CM

## 2018-03-05 NOTE — Patient Instructions (Signed)

## 2018-03-05 NOTE — Therapy (Signed)
[place], [place], [address] Phone: [phone]   Fax:  [phone]  Physical Therapy Treatment  Patient Details  Name: Kara Crawford MRN: [id number] Date of Birth: July 20, 1966 Referring Provider: Dr. Isidore Moos   Encounter Date: 03/05/2018  PT End of Session - 03/05/18 1108    Visit Number  4    Number of Visits  9    Date for PT Re-Evaluation  03/21/18    PT Start Time  0851    PT Stop Time  0933    PT Time Calculation (min)  42 min    Activity Tolerance  Patient tolerated treatment well    Behavior During Therapy  [facility] for tasks assessed/performed       Past Medical History:  Diagnosis Date  . Anemia    anemia prior to uterine emboliztion procedure.  . Breast cancer ([place])   . Diabetes mellitus without complication ([place])    type 2  . Family history of breast cancer   . Family history of ovarian cancer   . Family history of prostate cancer   . Fibroids   . Headache   . Heart murmur   . Hyperlipidemia   . Hypertension     Past Surgical History:  Procedure Laterality Date  . BREATH TEK H PYLORI N/A 04/17/2014   Procedure: BREATH TEK H PYLORI;  Surgeon: Shann Medal, MD;  Location: [facility];  Service: General;  Laterality: N/A;  . CESAREAN SECTION     x3- normal development  . ESOPHAGOGASTRODUODENOSCOPY N/A 08/20/2015   Procedure: ESOPHAGOGASTRODUODENOSCOPY (EGD);  Surgeon: Alphonsa Overall, MD;  Location: [facility];  Service: General;  Laterality: N/A;  . EXPLORATORY LAPAROTOMY  05/13/2015  . GASTRIC ROUX-EN-Y N/A 10/06/2014   Procedure: LAPAROSCOPIC ROUX-EN-Y GASTRIC BYPASS WITH UPPER ENDOSCOPY;  Surgeon: Pedro Earls, MD;  [facility];  Service: General;  Laterality: N/A;  . LAPAROSCOPY N/A 05/12/2015   Procedure: LAPAROSCOPY REPAIR OF PERFORATED BOWEL;  Surgeon: Alphonsa Overall, MD;  Location: [place];  Service: General;  Laterality: N/A;  . MASTECTOMY WITH RADIOACTIVE SEED GUIDED  EXCISION AND AXILLARY SENTINEL LYMPH NODE BIOPSY Bilateral 01/22/2018   Procedure: BILATERAL MASTECTOMIES WITH RADIOACTIVE SEED TARGETED RIGHT AXILLARY [place] NODE EXCISION AND RIGHT SENTINEL LYMPH NODE BIOPSY;  Surgeon: Alphonsa Overall, MD;  Location: [place];  Service: General;  Laterality: Bilateral;  . PORT-A-CATH REMOVAL Left 01/22/2018   Procedure: REMOVAL PORT-A-CATH;  Surgeon: Alphonsa Overall, MD;  Location: [facility];  Service: General;  Laterality: Left;  . PORTACATH PLACEMENT N/A 08/02/2017   Procedure: INSERTION PORT-A-CATH;  Surgeon: Alphonsa Overall, MD;  Location: [facility];  Service: General;  Laterality: N/A;  . UTERINE ARTERY EMBOLIZATION  08-16-2010    There were no vitals filed for this visit.  Subjective Assessment - 03/05/18 0902    Subjective  I am really surprised by my range of motion. My shoulder is not hurting like it was last week.     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 It measures 1 cm and is located in the upper outer quadrant with a Ki67 of 90%. She had 3 abnormal looking axillary nodes on ultrasound and 1 was biopsied and found to be positive, 01/22/18- underwent bilateral mastectomy and a SLNB on the right side with 6/6 were negative, pt has completed chemotherapy 12/14/17, pt is going to start radiation next week    Patient Stated  Goals  to get full ROM in the right arm    Currently in Pain?  No/denies    Pain Score  0-No pain                       [facility] Adult PT Treatment/Exercise - 03/05/18 0001      Shoulder Exercises: Supine   Horizontal ABduction  Strengthening;Both;10 reps;Theraband pt returned demo    Theraband Level (Shoulder Horizontal ABduction)  Level 1 (Yellow)    External Rotation  Strengthening;Both;10 reps;Theraband pt returned demon    Theraband Level (Shoulder External Rotation)  Level 1 (Yellow)    Flexion  Strengthening;Both;10  reps;Theraband narrow and wide grip, pt returned demo    Theraband Level (Shoulder Flexion)  Level 1 (Yellow)    Other Supine Exercises  D2 x 10 reps bilaterally with yellow band - pt returned demo       Shoulder Exercises: Pulleys   Flexion  2 minutes with verbal cues for good posture    ABduction  2 minutes returned therapist demonstration      Shoulder Exercises: Therapy Ball   Flexion  Both;10 reps returned therapist demonstration    ABduction  Right;10 reps returned therapist demonstration      Manual Therapy   Manual Therapy  Soft tissue mobilization;Myofascial release    Soft tissue mobilization  in area of mastectomy scars to increase mobility and along right pec in area of tightness    Myofascial Release  gently along area surrounding scar but not directly on top of scar to bilateral mastectomy scars with some improved mobility noted    Scapular Mobilization  --    Passive ROM  --                  PT Long Term Goals - 02/21/18 1213      PT LONG TERM GOAL #1   Title  Pt will be able to independently verbalize lymphedema risk reduction practices    Time  4    Period  Weeks    Status  New    Target Date  03/21/18      PT LONG TERM GOAL #2   Title  Pt will demonstrate improved right shoulder abduction of at least 160 degrees for increased ease of obtaining position for radiation    Baseline  97    Time  4    Period  Weeks    Status  New    Target Date  03/21/18      PT LONG TERM GOAL #3   Title  Pt will demonstrate improved right shoulder flexion to greater than 150 degrees to allow her to reach items on the top shelf    Baseline  137    Time  4    Period  Weeks    Status  New    Target Date  03/21/18      PT LONG TERM GOAL #4   Title  Pt will obtain a prophylactic compression sleeve and gauntlet from DME supplier to wear when she flies    Time  4    Period  Weeks    Status  New    Target Date  03/21/18      PT LONG TERM GOAL #5   Title  Pt will  be independent in a home exercise program for continued strengthening and stretching    Time  4    Period  Weeks    Status  New    Target Date  03/21/18            Plan - 03/05/18 1108    Clinical Impression Statement  Pt continues to have decreased pain in upper right shoulder with R shoulder ROM. She is also continuing to demonstrate improve R shoulder ROM. Instructed pt in supine scapular stabilization exercises to help strengthening scapula and decrease right shoulder pain. Issued these to pt as part of a home exercise program. Began soft tissue mobilization to right pec where pt has increased tightness.     Rehab Potential  Good    Clinical Impairments Affecting Rehab Potential  pt to begin radiation    PT Frequency  2x / week    PT Duration  4 weeks    PT Treatment/Interventions  Patient/family education;Passive range of motion;Manual techniques;Taping;Therapeutic exercise;Therapeutic activities;ADLs/Self Care Home Management;Scar mobilization    PT Next Visit Plan  continue gentle AA/A/PROM To R shoulder, continue pulleys and ball and scap mobs, STM to R pec    PT Home Exercise Plan  post op breast exercises, supine dowel    Consulted and Agree with Plan of Care  Patient       Patient will benefit from skilled therapeutic intervention in order to improve the following deficits and impairments:  Decreased range of motion, Impaired UE functional use, Pain, Decreased knowledge of precautions, Postural dysfunction, Decreased strength, Increased fascial restricitons  Visit Diagnosis: Stiffness of right shoulder, not elsewhere classified  Abnormal posture  Acute pain of right shoulder  Muscle weakness (generalized)     Problem List Patient Active Problem List   Diagnosis Date Noted  . Cancer of right breast, stage 2 ([place]) 01/22/2018  . Genetic testing 08/18/2017  . BRCA2 positive 08/18/2017  . Family history of breast cancer   . Family history of ovarian cancer   .  Family history of prostate cancer   . Malignant neoplasm of upper-outer quadrant of right breast in female, estrogen receptor negative ([place]) 07/25/2017  . Perforated bowel ([place]) 05/13/2015  . Free intraperitoneal air 05/12/2015  . Submucosal lesion of esophagus 10/06/2014  . 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 Diabetes ([place]) 09/24/2014  . Morbid obesity ([place]) 03/20/2014    Allyson Sabal [facility] 03/05/2018, 11:10 AM  [place], [place], [address] Phone: [phone]   Fax:  [phone]  Name: Kara Crawford MRN: [id number] Date of Birth: [birth year]  Manus Gunning, PT 03/05/18 11:10 AM

## 2018-03-06 ENCOUNTER — Ambulatory Visit
Admission: RE | Admit: 2018-03-06 | Discharge: 2018-03-06 | Disposition: A | Discharge status: Home / Self Care | Payer: 59 | Source: Ambulatory Visit | Attending: Radiation Oncology | Admitting: Radiation Oncology

## 2018-03-06 DIAGNOSIS — C50411 Malignant neoplasm of upper-outer quadrant of right female breast: Secondary | ICD-10-CM | POA: Diagnosis not present

## 2018-03-06 DIAGNOSIS — Z510 Encounter for antineoplastic radiation therapy: Secondary | ICD-10-CM | POA: Diagnosis not present

## 2018-03-06 DIAGNOSIS — M25611 Stiffness of right shoulder, not elsewhere classified: Secondary | ICD-10-CM | POA: Diagnosis not present

## 2018-03-07 ENCOUNTER — Ambulatory Visit
Admission: RE | Admit: 2018-03-07 | Discharge: 2018-03-07 | Disposition: A | Discharge status: Home / Self Care | Payer: 59 | Source: Ambulatory Visit | Attending: Radiation Oncology | Admitting: Radiation Oncology

## 2018-03-07 ENCOUNTER — Encounter: Payer: Self-pay | Admitting: Physical Therapy

## 2018-03-07 ENCOUNTER — Ambulatory Visit: Payer: 59 | Admitting: Physical Therapy

## 2018-03-07 DIAGNOSIS — R293 Abnormal posture: Secondary | ICD-10-CM

## 2018-03-07 DIAGNOSIS — M25611 Stiffness of right shoulder, not elsewhere classified: Secondary | ICD-10-CM | POA: Diagnosis not present

## 2018-03-07 DIAGNOSIS — M6281 Muscle weakness (generalized): Secondary | ICD-10-CM

## 2018-03-07 DIAGNOSIS — Z510 Encounter for antineoplastic radiation therapy: Secondary | ICD-10-CM | POA: Diagnosis not present

## 2018-03-07 DIAGNOSIS — M25511 Pain in right shoulder: Secondary | ICD-10-CM

## 2018-03-07 DIAGNOSIS — C50411 Malignant neoplasm of upper-outer quadrant of right female breast: Secondary | ICD-10-CM | POA: Diagnosis not present

## 2018-03-07 NOTE — Therapy (Signed)
[place], [place], [address] Phone: [phone]   Fax:  [phone]  Physical Therapy Treatment  Patient Details  Name: Kara Crawford MRN: [id number] Date of Birth: 1966-01-02 Referring Provider: Dr. Isidore Moos   Encounter Date: 03/07/2018  PT End of Session - 03/07/18 0933    Visit Number  5    Number of Visits  9    Date for PT Re-Evaluation  03/21/18    PT Start Time  0851    PT Stop Time  0933    PT Time Calculation (min)  42 min    Activity Tolerance  Patient tolerated treatment well    Behavior During Therapy  [facility] for tasks assessed/performed       Past Medical History:  Diagnosis Date  . Anemia    anemia prior to uterine emboliztion procedure.  . Breast cancer ([place])   . Diabetes mellitus without complication ([place])    type 2  . Family history of breast cancer   . Family history of ovarian cancer   . Family history of prostate cancer   . Fibroids   . Headache   . Heart murmur   . Hyperlipidemia   . Hypertension     Past Surgical History:  Procedure Laterality Date  . BREATH TEK H PYLORI N/A 04/17/2014   Procedure: BREATH TEK H PYLORI;  Surgeon: Shann Medal, MD;  Location: [facility];  Service: General;  Laterality: N/A;  . CESAREAN SECTION     x3- normal development  . ESOPHAGOGASTRODUODENOSCOPY N/A 08/20/2015   Procedure: ESOPHAGOGASTRODUODENOSCOPY (EGD);  Surgeon: Alphonsa Overall, MD;  Location: [facility];  Service: General;  Laterality: N/A;  . EXPLORATORY LAPAROTOMY  05/13/2015  . GASTRIC ROUX-EN-Y N/A 10/06/2014   Procedure: LAPAROSCOPIC ROUX-EN-Y GASTRIC BYPASS WITH UPPER ENDOSCOPY;  Surgeon: Pedro Earls, MD;  [facility];  Service: General;  Laterality: N/A;  . LAPAROSCOPY N/A 05/12/2015   Procedure: LAPAROSCOPY REPAIR OF PERFORATED BOWEL;  Surgeon: Alphonsa Overall, MD;  Location: [place];  Service: General;  Laterality: N/A;  . MASTECTOMY WITH RADIOACTIVE SEED GUIDED  EXCISION AND AXILLARY SENTINEL LYMPH NODE BIOPSY Bilateral 01/22/2018   Procedure: BILATERAL MASTECTOMIES WITH RADIOACTIVE SEED TARGETED RIGHT AXILLARY [place] NODE EXCISION AND RIGHT SENTINEL LYMPH NODE BIOPSY;  Surgeon: Alphonsa Overall, MD;  Location: [place];  Service: General;  Laterality: Bilateral;  . PORT-A-CATH REMOVAL Left 01/22/2018   Procedure: REMOVAL PORT-A-CATH;  Surgeon: Alphonsa Overall, MD;  Location: [place];  Service: General;  Laterality: Left;  . PORTACATH PLACEMENT N/A 08/02/2017   Procedure: INSERTION PORT-A-CATH;  Surgeon: Alphonsa Overall, MD;  Location: [place];  Service: General;  Laterality: N/A;  . UTERINE ARTERY EMBOLIZATION  08-16-2010    There were no vitals filed for this visit.  Subjective Assessment - 03/07/18 0852    Subjective  My shoulder is doing good. I have been working with it. I am not having any pain in it but if I take it too far I do feel it a little in the top of the joint.     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 It measures 1 cm and is located in the upper outer quadrant with a Ki67 of 90%. She had 3 abnormal looking axillary nodes on ultrasound and 1 was biopsied and found to be positive, 01/22/18- underwent bilateral mastectomy and a SLNB on the right side with 6/6 were  negative, pt has completed chemotherapy 12/14/17, pt is going to start radiation next week    Patient Stated Goals  to get full ROM in the right arm    Currently in Pain?  No/denies    Pain Score  0-No pain         [facility] PT Assessment - 03/07/18 0001      AROM   Right Shoulder Flexion  150 Degrees    Right Shoulder ABduction  137 Degrees                   [facility] Adult PT Treatment/Exercise - 03/07/18 0001      Shoulder Exercises: Supine   Horizontal ABduction  Strengthening;Both;10 reps;Theraband pt returned demo    Theraband Level (Shoulder Horizontal ABduction)  Level  2 (Red)    External Rotation  Strengthening;Both;10 reps;Theraband pt returned demon    Theraband Level (Shoulder External Rotation)  Level 2 (Red)    Flexion  Strengthening;Both;10 reps;Theraband narrow and wide grip, pt returned demo    Theraband Level (Shoulder Flexion)  Level 2 (Red)    Other Supine Exercises  D2 x 10 reps bilaterally with red band - pt returned demo       Shoulder Exercises: Pulleys   Flexion  2 minutes with verbal cues for good posture    ABduction  2 minutes returned therapist demonstration      Shoulder Exercises: Therapy Ball   Flexion  Both;10 reps returned therapist demonstration    ABduction  Right;10 reps returned therapist demonstration      Manual Therapy   Manual Therapy  Soft tissue mobilization;Myofascial release    Soft tissue mobilization  in area of mastectomy scars to increase mobility and along right and left pec in area of tightness    Myofascial Release  gently along area surrounding scar but not directly on top of scar to bilateral mastectomy scars with some improved mobility noted    Passive ROM  to R shoulder in supine gently in direction of flexion, abduction, and ER to pt's tolerance- pt had less discomfort today with abduction                  PT Long Term Goals - 02/21/18 1213      PT LONG TERM GOAL #1   Title  Pt will be able to independently verbalize lymphedema risk reduction practices    Time  4    Period  Weeks    Status  New    Target Date  03/21/18      PT LONG TERM GOAL #2   Title  Pt will demonstrate improved right shoulder abduction of at least 160 degrees for increased ease of obtaining position for radiation    Baseline  97    Time  4    Period  Weeks    Status  New    Target Date  03/21/18      PT LONG TERM GOAL #3   Title  Pt will demonstrate improved right shoulder flexion to greater than 150 degrees to allow her to reach items on the top shelf    Baseline  137    Time  4    Period  Weeks    Status   New    Target Date  03/21/18      PT LONG TERM GOAL #4   Title  Pt will obtain a prophylactic compression sleeve and gauntlet from DME supplier to wear when she flies  Time  4    Period  Weeks    Status  New    Target Date  03/21/18      PT LONG TERM GOAL #5   Title  Pt will be independent in a home exercise program for continued strengthening and stretching    Time  4    Period  Weeks    Status  New    Target Date  03/21/18            Plan - 03/07/18 0934    Clinical Impression Statement  Pt continues to demonstrate improving right shoulder ROM especially in direction of abduction. She continues to have decreased pain in the shoulder joint with ROM. Continued with myofascial release along sides of scar and soft tissue mobilization to bilateral pecs and scar to help decrease tightness. Pores surrounding scar were less visible following massage. Pt did have a small knot in left axilla that may be scar tissue but educated pt to follow up with her doctor about this. Increased resistance with supine scap to red band today.     Rehab Potential  Good    Clinical Impairments Affecting Rehab Potential  pt to begin radiation    PT Frequency  2x / week    PT Duration  4 weeks    PT Treatment/Interventions  Patient/family education;Passive range of motion;Manual techniques;Taping;Therapeutic exercise;Therapeutic activities;ADLs/Self Care Home Management;Scar mobilization    PT Next Visit Plan  continue gentle AA/A/PROM To R shoulder, continue pulleys and ball and scap mobs, STM to R and L  pec    PT Home Exercise Plan  post op breast exercises, supine dowel    Consulted and Agree with Plan of Care  Patient       Patient will benefit from skilled therapeutic intervention in order to improve the following deficits and impairments:  Decreased range of motion, Impaired UE functional use, Pain, Decreased knowledge of precautions, Postural dysfunction, Decreased strength, Increased fascial  restricitons  Visit Diagnosis: Stiffness of right shoulder, not elsewhere classified  Abnormal posture  Acute pain of right shoulder  Muscle weakness (generalized)     Problem List Patient Active Problem List   Diagnosis Date Noted  . Cancer of right breast, stage 2 ([place]) 01/22/2018  . Genetic testing 08/18/2017  . BRCA2 positive 08/18/2017  . Family history of breast cancer   . Family history of ovarian cancer   . Family history of prostate cancer   . Malignant neoplasm of upper-outer quadrant of right breast in female, estrogen receptor negative ([place]) 07/25/2017  . Perforated bowel ([place]) 05/13/2015  . Free intraperitoneal air 05/12/2015  . Submucosal lesion of esophagus 10/06/2014  . 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 Diabetes ([place]) 09/24/2014  . Morbid obesity ([place]) 03/20/2014    Allyson Sabal [facility] 03/07/2018, 9:36 AM  [place], [place], [address] Phone: [phone]   Fax:  [phone]  Name: Kara Crawford MRN: [id number] Date of Birth: 07/12/1966  Manus Gunning, PT 03/07/18 9:37 AM

## 2018-03-08 ENCOUNTER — Ambulatory Visit
Admission: RE | Admit: 2018-03-08 | Discharge: 2018-03-08 | Disposition: A | Discharge status: Home / Self Care | Payer: 59 | Source: Ambulatory Visit | Attending: Radiation Oncology | Admitting: Radiation Oncology

## 2018-03-08 DIAGNOSIS — C50411 Malignant neoplasm of upper-outer quadrant of right female breast: Secondary | ICD-10-CM | POA: Diagnosis not present

## 2018-03-08 DIAGNOSIS — Z510 Encounter for antineoplastic radiation therapy: Secondary | ICD-10-CM | POA: Diagnosis not present

## 2018-03-09 ENCOUNTER — Ambulatory Visit
Admission: RE | Admit: 2018-03-09 | Discharge: 2018-03-09 | Disposition: A | Discharge status: Home / Self Care | Payer: 59 | Source: Ambulatory Visit | Attending: Radiation Oncology | Admitting: Radiation Oncology

## 2018-03-09 DIAGNOSIS — C50411 Malignant neoplasm of upper-outer quadrant of right female breast: Secondary | ICD-10-CM | POA: Diagnosis not present

## 2018-03-09 DIAGNOSIS — Z510 Encounter for antineoplastic radiation therapy: Secondary | ICD-10-CM | POA: Diagnosis not present

## 2018-03-12 ENCOUNTER — Other Ambulatory Visit: Payer: Self-pay | Admitting: Student

## 2018-03-12 ENCOUNTER — Ambulatory Visit
Admission: RE | Admit: 2018-03-12 | Discharge: 2018-03-12 | Disposition: A | Discharge status: Home / Self Care | Payer: 59 | Source: Ambulatory Visit | Attending: Radiation Oncology | Admitting: Radiation Oncology

## 2018-03-12 ENCOUNTER — Encounter: Payer: Self-pay | Admitting: Physical Therapy

## 2018-03-12 ENCOUNTER — Ambulatory Visit: Payer: 59 | Admitting: Physical Therapy

## 2018-03-12 DIAGNOSIS — M25511 Pain in right shoulder: Secondary | ICD-10-CM

## 2018-03-12 DIAGNOSIS — M25611 Stiffness of right shoulder, not elsewhere classified: Secondary | ICD-10-CM | POA: Diagnosis not present

## 2018-03-12 DIAGNOSIS — C50411 Malignant neoplasm of upper-outer quadrant of right female breast: Secondary | ICD-10-CM | POA: Diagnosis not present

## 2018-03-12 DIAGNOSIS — R293 Abnormal posture: Secondary | ICD-10-CM

## 2018-03-12 DIAGNOSIS — N6332 Unspecified lump in axillary tail of the left breast: Secondary | ICD-10-CM

## 2018-03-12 DIAGNOSIS — Z171 Estrogen receptor negative status [ER-]: Principal | ICD-10-CM

## 2018-03-12 DIAGNOSIS — M6281 Muscle weakness (generalized): Secondary | ICD-10-CM

## 2018-03-12 DIAGNOSIS — Z510 Encounter for antineoplastic radiation therapy: Secondary | ICD-10-CM | POA: Diagnosis not present

## 2018-03-12 MED ORDER — RADIAPLEXRX EX GEL
Freq: Once | CUTANEOUS | Status: AC
  Administered 2018-03-12: 16:00:00 via TOPICAL

## 2018-03-12 NOTE — Progress Notes (Signed)
Pt here for patient teaching.  Pt given Radiation and You booklet, skin care instructions and Radiaplex gel.  Reviewed areas of pertinence such as fatigue, skin changes, breast tenderness and breast swelling . Pt able to give teach back of to pat skin, use unscented/gentle soap and drink plenty of water,apply Radiaplex bid, avoid applying anything to skin within 4 hours of treatment, avoid wearing an under wire bra and to use an electric razor if they must shave. Pt verbalizes understanding of information given and will contact nursing with any questions or concerns.     Http://rtanswers.org/treatmentinformation/whattoexpect/index      

## 2018-03-12 NOTE — Therapy (Signed)
[place], [place], [address] Phone: [phone]   Fax:  [phone]  Physical Therapy Treatment  Patient Details  Name: Kara Crawford MRN: [id number] Date of Birth: 30-Mar-1966 Referring Provider: Dr. Isidore Moos   Encounter Date: 03/12/2018  PT End of Session - 03/12/18 1033    Visit Number  6    Number of Visits  9    Date for PT Re-Evaluation  03/21/18    PT Start Time  0848    PT Stop Time  0933    PT Time Calculation (min)  45 min    Activity Tolerance  Patient tolerated treatment well    Behavior During Therapy  [facility] for tasks assessed/performed       Past Medical History:  Diagnosis Date  . Anemia    anemia prior to uterine emboliztion procedure.  . Breast cancer ([place])   . Diabetes mellitus without complication ([place])    type 2  . Family history of breast cancer   . Family history of ovarian cancer   . Family history of prostate cancer   . Fibroids   . Headache   . Heart murmur   . Hyperlipidemia   . Hypertension     Past Surgical History:  Procedure Laterality Date  . BREATH TEK H PYLORI N/A 04/17/2014   Procedure: BREATH TEK H PYLORI;  Surgeon: Shann Medal, MD;  Location: [facility];  Service: General;  Laterality: N/A;  . CESAREAN SECTION     x3- normal development  . ESOPHAGOGASTRODUODENOSCOPY N/A 08/20/2015   Procedure: ESOPHAGOGASTRODUODENOSCOPY (EGD);  Surgeon: Alphonsa Overall, MD;  Location: [facility];  Service: General;  Laterality: N/A;  . EXPLORATORY LAPAROTOMY  05/13/2015  . GASTRIC ROUX-EN-Y N/A 10/06/2014   Procedure: LAPAROSCOPIC ROUX-EN-Y GASTRIC BYPASS WITH UPPER ENDOSCOPY;  Surgeon: Pedro Earls, MD;  [facility];  Service: General;  Laterality: N/A;  . LAPAROSCOPY N/A 05/12/2015   Procedure: LAPAROSCOPY REPAIR OF PERFORATED BOWEL;  Surgeon: Alphonsa Overall, MD;  Location: [place];  Service: General;  Laterality: N/A;  . MASTECTOMY WITH RADIOACTIVE SEED GUIDED  EXCISION AND AXILLARY SENTINEL LYMPH NODE BIOPSY Bilateral 01/22/2018   Procedure: BILATERAL MASTECTOMIES WITH RADIOACTIVE SEED TARGETED RIGHT AXILLARY [place] NODE EXCISION AND RIGHT SENTINEL LYMPH NODE BIOPSY;  Surgeon: Alphonsa Overall, MD;  Location: [place];  Service: General;  Laterality: Bilateral;  . PORT-A-CATH REMOVAL Left 01/22/2018   Procedure: REMOVAL PORT-A-CATH;  Surgeon: Alphonsa Overall, MD;  Location: [place];  Service: General;  Laterality: Left;  . PORTACATH PLACEMENT N/A 08/02/2017   Procedure: INSERTION PORT-A-CATH;  Surgeon: Alphonsa Overall, MD;  Location: [place];  Service: General;  Laterality: N/A;  . UTERINE ARTERY EMBOLIZATION  08-16-2010    There were no vitals filed for this visit.  Subjective Assessment - 03/12/18 0851    Subjective  My shoulder feels good. It has not been hurting. I didn't stretch any yesterday but I did Friday and Saturday.    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 It measures 1 cm and is located in the upper outer quadrant with a Ki67 of 90%. She had 3 abnormal looking axillary nodes on ultrasound and 1 was biopsied and found to be positive, 01/22/18- underwent bilateral mastectomy and a SLNB on the right side with 6/6 were negative, pt has completed chemotherapy 12/14/17, pt is going to start radiation next week    Patient Stated  Goals  to get full ROM in the right arm    Currently in Pain?  No/denies    Pain Score  0-No pain                       [facility] Adult PT Treatment/Exercise - 03/12/18 0001      Shoulder Exercises: Supine   Protraction  Strengthening;Right;10 reps;Weights pt returned therapist demo    Protraction Weight (lbs)  2      Shoulder Exercises: Pulleys   Flexion  2 minutes with verbal cues for good posture    ABduction  2 minutes returned therapist demonstration      Shoulder Exercises: Therapy Ball   Flexion  Both;10  reps returned therapist demonstration    ABduction  Right;10 reps returned therapist demonstration      Manual Therapy   Manual Therapy  Soft tissue mobilization;Myofascial release    Soft tissue mobilization  in area of mastectomy scars to increase mobility and along tendons of rotator cuff at right upper arm    Myofascial Release  gently along area surrounding scar but not directly on top of scar to bilateral mastectomy scars with some improved mobility noted    Passive ROM  to R shoulder in supine gently in direction of flexion, abduction, and ER to pt's tolerance- pt had more discomfort today with abduction                  PT Long Term Goals - 02/21/18 1213      PT LONG TERM GOAL #1   Title  Pt will be able to independently verbalize lymphedema risk reduction practices    Time  4    Period  Weeks    Status  New    Target Date  03/21/18      PT LONG TERM GOAL #2   Title  Pt will demonstrate improved right shoulder abduction of at least 160 degrees for increased ease of obtaining position for radiation    Baseline  97    Time  4    Period  Weeks    Status  New    Target Date  03/21/18      PT LONG TERM GOAL #3   Title  Pt will demonstrate improved right shoulder flexion to greater than 150 degrees to allow her to reach items on the top shelf    Baseline  137    Time  4    Period  Weeks    Status  New    Target Date  03/21/18      PT LONG TERM GOAL #4   Title  Pt will obtain a prophylactic compression sleeve and gauntlet from DME supplier to wear when she flies    Time  4    Period  Weeks    Status  New    Target Date  03/21/18      PT LONG TERM GOAL #5   Title  Pt will be independent in a home exercise program for continued strengthening and stretching    Time  4    Period  Weeks    Status  New    Target Date  03/21/18            Plan - 03/12/18 1034    Clinical Impression Statement  Pt was having increased right shoulder pain again today with  abduction. She is feeling it more in her upper arm instead of the joint. Continued  with scapular mobilization and her right scapula was more tight today. She is continuing to demonstrate improved scar mobility especially of left mastectomy scar. Issued supine protraction for R scapula for pt to add to HEP.     Rehab Potential  Good    Clinical Impairments Affecting Rehab Potential  pt to begin radiation    PT Frequency  2x / week    PT Duration  4 weeks    PT Treatment/Interventions  Patient/family education;Passive range of motion;Manual techniques;Taping;Therapeutic exercise;Therapeutic activities;ADLs/Self Care Home Management;Scar mobilization    PT Next Visit Plan  continue gentle AA/A/PROM To R shoulder, continue pulleys and ball and scap mobs, STM to R and L  pec    PT Home Exercise Plan  post op breast exercises, supine dowel, supine protraction    Consulted and Agree with Plan of Care  Patient       Patient will benefit from skilled therapeutic intervention in order to improve the following deficits and impairments:  Decreased range of motion, Impaired UE functional use, Pain, Decreased knowledge of precautions, Postural dysfunction, Decreased strength, Increased fascial restricitons  Visit Diagnosis: Stiffness of right shoulder, not elsewhere classified  Abnormal posture  Acute pain of right shoulder  Muscle weakness (generalized)     Problem List Patient Active Problem List   Diagnosis Date Noted  . Cancer of right breast, stage 2 ([place]) 01/22/2018  . Genetic testing 08/18/2017  . BRCA2 positive 08/18/2017  . Family history of breast cancer   . Family history of ovarian cancer   . Family history of prostate cancer   . Malignant neoplasm of upper-outer quadrant of right breast in female, estrogen receptor negative ([place]) 07/25/2017  . Perforated bowel ([place]) 05/13/2015  . Free intraperitoneal air 05/12/2015  . Submucosal lesion of esophagus 10/06/2014  . Lap Roux Y  gastric bypass Dec 2015 10/06/2014  . 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 Diabetes ([place]) 09/24/2014  . Morbid obesity ([place]) 03/20/2014    Allyson Sabal [facility] 03/12/2018, 10:37 AM  [place], [place], [address] Phone: [phone]   Fax:  [phone]  Name: Kara Crawford MRN: [id number] Date of Birth: 1966/06/06  Manus Gunning, PT 03/12/18 10:37 AM

## 2018-03-13 ENCOUNTER — Ambulatory Visit
Admission: RE | Admit: 2018-03-13 | Discharge: 2018-03-13 | Disposition: A | Discharge status: Home / Self Care | Payer: 59 | Source: Ambulatory Visit | Attending: Radiation Oncology | Admitting: Radiation Oncology

## 2018-03-13 DIAGNOSIS — Z510 Encounter for antineoplastic radiation therapy: Secondary | ICD-10-CM | POA: Diagnosis not present

## 2018-03-13 DIAGNOSIS — C50411 Malignant neoplasm of upper-outer quadrant of right female breast: Secondary | ICD-10-CM | POA: Diagnosis not present

## 2018-03-14 ENCOUNTER — Ambulatory Visit
Admission: RE | Admit: 2018-03-14 | Discharge: 2018-03-14 | Disposition: A | Discharge status: Home / Self Care | Payer: 59 | Source: Ambulatory Visit | Attending: Student | Admitting: Student

## 2018-03-14 ENCOUNTER — Ambulatory Visit: Payer: 59 | Admitting: Physical Therapy

## 2018-03-14 ENCOUNTER — Other Ambulatory Visit: Payer: Self-pay | Admitting: Student

## 2018-03-14 ENCOUNTER — Encounter: Payer: Self-pay | Admitting: Physical Therapy

## 2018-03-14 ENCOUNTER — Ambulatory Visit
Admission: RE | Admit: 2018-03-14 | Discharge: 2018-03-14 | Disposition: A | Discharge status: Home / Self Care | Payer: 59 | Source: Ambulatory Visit | Attending: Radiation Oncology | Admitting: Radiation Oncology

## 2018-03-14 DIAGNOSIS — M25611 Stiffness of right shoulder, not elsewhere classified: Secondary | ICD-10-CM | POA: Diagnosis not present

## 2018-03-14 DIAGNOSIS — C50911 Malignant neoplasm of unspecified site of right female breast: Secondary | ICD-10-CM | POA: Diagnosis not present

## 2018-03-14 DIAGNOSIS — Z510 Encounter for antineoplastic radiation therapy: Secondary | ICD-10-CM | POA: Diagnosis not present

## 2018-03-14 DIAGNOSIS — R293 Abnormal posture: Secondary | ICD-10-CM

## 2018-03-14 DIAGNOSIS — M25511 Pain in right shoulder: Secondary | ICD-10-CM

## 2018-03-14 DIAGNOSIS — N6320 Unspecified lump in the left breast, unspecified quadrant: Secondary | ICD-10-CM | POA: Diagnosis not present

## 2018-03-14 DIAGNOSIS — N6332 Unspecified lump in axillary tail of the left breast: Secondary | ICD-10-CM

## 2018-03-14 DIAGNOSIS — M6281 Muscle weakness (generalized): Secondary | ICD-10-CM

## 2018-03-14 DIAGNOSIS — C50411 Malignant neoplasm of upper-outer quadrant of right female breast: Secondary | ICD-10-CM | POA: Diagnosis not present

## 2018-03-14 NOTE — Therapy (Signed)
[place], [place], [address] Phone: [phone]   Fax:  [phone]  Physical Therapy Treatment  Patient Details  Name: Kara Crawford MRN: [id number] Date of Birth: November 13, 1965 Referring Provider: Dr. Isidore Moos   Encounter Date: 03/14/2018  PT End of Session - 03/14/18 1031    Visit Number  7    Number of Visits  9    Date for PT Re-Evaluation  03/21/18    PT Start Time  0855    PT Stop Time  0937    PT Time Calculation (min)  42 min    Activity Tolerance  Patient tolerated treatment well    Behavior During Therapy  [facility] for tasks assessed/performed       Past Medical History:  Diagnosis Date  . Anemia    anemia prior to uterine emboliztion procedure.  . Breast cancer ([place])   . Diabetes mellitus without complication ([place])    type 2  . Family history of breast cancer   . Family history of ovarian cancer   . Family history of prostate cancer   . Fibroids   . Headache   . Heart murmur   . Hyperlipidemia   . Hypertension     Past Surgical History:  Procedure Laterality Date  . BREATH TEK H PYLORI N/A 04/17/2014   Procedure: BREATH TEK H PYLORI;  Surgeon: Shann Medal, MD;  Location: [facility];  Service: General;  Laterality: N/A;  . CESAREAN SECTION     x3- normal development  . ESOPHAGOGASTRODUODENOSCOPY N/A 08/20/2015   Procedure: ESOPHAGOGASTRODUODENOSCOPY (EGD);  Surgeon: Alphonsa Overall, MD;  Location: [facility];  Service: General;  Laterality: N/A;  . EXPLORATORY LAPAROTOMY  05/13/2015  . GASTRIC ROUX-EN-Y N/A 10/06/2014   Procedure: LAPAROSCOPIC ROUX-EN-Y GASTRIC BYPASS WITH UPPER ENDOSCOPY;  Surgeon: Pedro Earls, MD;  [facility];  Service: General;  Laterality: N/A;  . LAPAROSCOPY N/A 05/12/2015   Procedure: LAPAROSCOPY REPAIR OF PERFORATED BOWEL;  Surgeon: Alphonsa Overall, MD;  Location: [place];  Service: General;  Laterality: N/A;  . MASTECTOMY WITH RADIOACTIVE SEED GUIDED  EXCISION AND AXILLARY SENTINEL LYMPH NODE BIOPSY Bilateral 01/22/2018   Procedure: BILATERAL MASTECTOMIES WITH RADIOACTIVE SEED TARGETED RIGHT AXILLARY [place] NODE EXCISION AND RIGHT SENTINEL LYMPH NODE BIOPSY;  Surgeon: Alphonsa Overall, MD;  Location: [place];  Service: General;  Laterality: Bilateral;  . PORT-A-CATH REMOVAL Left 01/22/2018   Procedure: REMOVAL PORT-A-CATH;  Surgeon: Alphonsa Overall, MD;  Location: [place];  Service: General;  Laterality: Left;  . PORTACATH PLACEMENT N/A 08/02/2017   Procedure: INSERTION PORT-A-CATH;  Surgeon: Alphonsa Overall, MD;  Location: [place];  Service: General;  Laterality: N/A;  . UTERINE ARTERY EMBOLIZATION  08-16-2010    There were no vitals filed for this visit.  Subjective Assessment - 03/14/18 0857    Subjective  I had the ultrasound on that lump on the left side and they said it was a seroma. My shoulder pain is doing okay. I did a lot yesterday. It is still a little painful.     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 It measures 1 cm and is located in the upper outer quadrant with a Ki67 of 90%. She had 3 abnormal looking axillary nodes on ultrasound and 1 was biopsied and found to be positive, 01/22/18- underwent bilateral mastectomy and a SLNB on the right side with 6/6 were negative, pt has  completed chemotherapy 12/14/17, pt is going to start radiation next week    Patient Stated Goals  to get full ROM in the right arm    Currently in Pain?  No/denies    Pain Score  0-No pain                       [facility] Adult PT Treatment/Exercise - 03/14/18 0001      Shoulder Exercises: Pulleys   Flexion  2 minutes with verbal cues for good posture    ABduction  2 minutes returned therapist demonstration      Shoulder Exercises: Therapy Ball   Flexion  Both;10 reps returned therapist demonstration    ABduction  Right;10 reps returned therapist  demonstration      Manual Therapy   Manual Therapy  Soft tissue mobilization;Myofascial release    Soft tissue mobilization  to right pec with biotone in area of tightness, pt reported relief following this    Myofascial Release  gently along area surrounding scar to right mastectomy scars with some improved mobility noted    Passive ROM  to R shoulder in supine gently in direction of flexion, abduction, and ER to pt's tolerance- pt had more discomfort today with abduction                  PT Long Term Goals - 02/21/18 1213      PT LONG TERM GOAL #1   Title  Pt will be able to independently verbalize lymphedema risk reduction practices    Time  4    Period  Weeks    Status  New    Target Date  03/21/18      PT LONG TERM GOAL #2   Title  Pt will demonstrate improved right shoulder abduction of at least 160 degrees for increased ease of obtaining position for radiation    Baseline  97    Time  4    Period  Weeks    Status  New    Target Date  03/21/18      PT LONG TERM GOAL #3   Title  Pt will demonstrate improved right shoulder flexion to greater than 150 degrees to allow her to reach items on the top shelf    Baseline  137    Time  4    Period  Weeks    Status  New    Target Date  03/21/18      PT LONG TERM GOAL #4   Title  Pt will obtain a prophylactic compression sleeve and gauntlet from DME supplier to wear when she flies    Time  4    Period  Weeks    Status  New    Target Date  03/21/18      PT LONG TERM GOAL #5   Title  Pt will be independent in a home exercise program for continued strengthening and stretching    Time  4    Period  Weeks    Status  New    Target Date  03/21/18            Plan - 03/14/18 1031    Clinical Impression Statement  Pt still having increased right shoulder pain in upper part of joint that is relieved somewhat after mobilizing right scapula. She continues to demonstrate increased tightness in right scapula. She does  have some cording in right axilla that is visible with abduction. Continued with STM to  right pectoral muscle because pt has increased tightness here. Pt felt relief with this at end of session.     Rehab Potential  Good    Clinical Impairments Affecting Rehab Potential  pt to begin radiation    PT Frequency  2x / week    PT Duration  4 weeks    PT Treatment/Interventions  Patient/family education;Passive range of motion;Manual techniques;Taping;Therapeutic exercise;Therapeutic activities;ADLs/Self Care Home Management;Scar mobilization    PT Next Visit Plan  update POC if it is time, give rockwood, continue gentle AA/A/PROM To R shoulder, continue pulleys and ball and scap mobs, STM to R and L  pec    PT Home Exercise Plan  post op breast exercises, supine dowel, supine protraction    Consulted and Agree with Plan of Care  Patient       Patient will benefit from skilled therapeutic intervention in order to improve the following deficits and impairments:  Decreased range of motion, Impaired UE functional use, Pain, Decreased knowledge of precautions, Postural dysfunction, Decreased strength, Increased fascial restricitons  Visit Diagnosis: Stiffness of right shoulder, not elsewhere classified  Abnormal posture  Acute pain of right shoulder  Muscle weakness (generalized)     Problem List Patient Active Problem List   Diagnosis Date Noted  . Cancer of right breast, stage 2 ([place]) 01/22/2018  . Genetic testing 08/18/2017  . BRCA2 positive 08/18/2017  . Family history of breast cancer   . Family history of ovarian cancer   . Family history of prostate cancer   . Malignant neoplasm of upper-outer quadrant of right breast in female, estrogen receptor negative ([place]) 07/25/2017  . Perforated bowel ([place]) 05/13/2015  . Free intraperitoneal air 05/12/2015  . Submucosal lesion of esophagus 10/06/2014  . 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  Diabetes ([place]) 09/24/2014  . Morbid obesity ([place]) 03/20/2014    Allyson Sabal [facility] 03/14/2018, 10:34 AM  [place], [place], [address] Phone: [phone]   Fax:  [phone]  Name: Kara Crawford MRN: [id number] Date of Birth: August 10, 1966  Manus Gunning, PT 03/14/18 10:34 AM

## 2018-03-15 ENCOUNTER — Ambulatory Visit
Admission: RE | Admit: 2018-03-15 | Discharge: 2018-03-15 | Disposition: A | Discharge status: Home / Self Care | Payer: 59 | Source: Ambulatory Visit | Attending: Radiation Oncology | Admitting: Radiation Oncology

## 2018-03-15 DIAGNOSIS — C50411 Malignant neoplasm of upper-outer quadrant of right female breast: Secondary | ICD-10-CM | POA: Diagnosis not present

## 2018-03-15 DIAGNOSIS — Z510 Encounter for antineoplastic radiation therapy: Secondary | ICD-10-CM | POA: Diagnosis not present

## 2018-03-16 ENCOUNTER — Ambulatory Visit
Admission: RE | Admit: 2018-03-16 | Discharge: 2018-03-16 | Disposition: A | Discharge status: Home / Self Care | Payer: 59 | Source: Ambulatory Visit | Attending: Radiation Oncology | Admitting: Radiation Oncology

## 2018-03-16 DIAGNOSIS — Z510 Encounter for antineoplastic radiation therapy: Secondary | ICD-10-CM | POA: Diagnosis not present

## 2018-03-16 DIAGNOSIS — C50411 Malignant neoplasm of upper-outer quadrant of right female breast: Secondary | ICD-10-CM | POA: Diagnosis not present

## 2018-03-19 ENCOUNTER — Encounter: Payer: Self-pay | Admitting: Physical Therapy

## 2018-03-19 ENCOUNTER — Ambulatory Visit
Admission: RE | Admit: 2018-03-19 | Discharge: 2018-03-19 | Disposition: A | Discharge status: Home / Self Care | Payer: 59 | Source: Ambulatory Visit | Attending: Radiation Oncology | Admitting: Radiation Oncology

## 2018-03-19 ENCOUNTER — Ambulatory Visit: Payer: 59 | Admitting: Physical Therapy

## 2018-03-19 DIAGNOSIS — M6281 Muscle weakness (generalized): Secondary | ICD-10-CM

## 2018-03-19 DIAGNOSIS — Z510 Encounter for antineoplastic radiation therapy: Secondary | ICD-10-CM | POA: Diagnosis not present

## 2018-03-19 DIAGNOSIS — M25611 Stiffness of right shoulder, not elsewhere classified: Secondary | ICD-10-CM

## 2018-03-19 DIAGNOSIS — R293 Abnormal posture: Secondary | ICD-10-CM

## 2018-03-19 DIAGNOSIS — C50411 Malignant neoplasm of upper-outer quadrant of right female breast: Secondary | ICD-10-CM | POA: Diagnosis not present

## 2018-03-19 DIAGNOSIS — M25511 Pain in right shoulder: Secondary | ICD-10-CM

## 2018-03-19 NOTE — Patient Instructions (Addendum)
Strengthening: Resisted Flexion   Cancer Rehab [phone]    Hold tubing with right arm at side. Pull forward and up. Move shoulder through pain-free range of motion. Repeat __10__ times per set. Do _1-2___ sessions per day.  Strengthening: Resisted Internal Rotation    Hold tubing in right hand, elbow at side and forearm out. Rotate forearm in across body. Repeat __10__ times per set. Do _1-2___ sessions per day.  Strengthening: Resisted Extension    Hold tubing in right hand, arm forward. Pull arm back, elbow straight. Repeat _10___ times per set. Do _1-2___ sessions per day.  Strengthening: Resisted External Rotation    Hold tubing in right hand, elbow at side and forearm across body. Rotate forearm out. Repeat _10___ times per set. Do __1-2__ sessions per day.    Repeat on left side.

## 2018-03-19 NOTE — Therapy (Signed)
[place], [place], [address] Phone: [phone]   Fax:  [phone]  Physical Therapy Treatment  Patient Details  Name: Kara Crawford MRN: [id number] Date of Birth: April 27, 1966 Referring Provider: Dr. Isidore Moos   Encounter Date: 03/19/2018  PT End of Session - 03/19/18 1215    Visit Number  8    Number of Visits  9    Date for PT Re-Evaluation  03/21/18    PT Start Time  0849    PT Stop Time  0933    PT Time Calculation (min)  44 min    Activity Tolerance  Patient tolerated treatment well    Behavior During Therapy  [facility] for tasks assessed/performed       Past Medical History:  Diagnosis Date  . Anemia    anemia prior to uterine emboliztion procedure.  . Breast cancer ([place])   . Diabetes mellitus without complication ([place])    type 2  . Family history of breast cancer   . Family history of ovarian cancer   . Family history of prostate cancer   . Fibroids   . Headache   . Heart murmur   . Hyperlipidemia   . Hypertension     Past Surgical History:  Procedure Laterality Date  . BREATH TEK H PYLORI N/A 04/17/2014   Procedure: BREATH TEK H PYLORI;  Surgeon: Shann Medal, MD;  Location: [facility];  Service: General;  Laterality: N/A;  . CESAREAN SECTION     x3- normal development  . ESOPHAGOGASTRODUODENOSCOPY N/A 08/20/2015   Procedure: ESOPHAGOGASTRODUODENOSCOPY (EGD);  Surgeon: Alphonsa Overall, MD;  Location: [facility];  Service: General;  Laterality: N/A;  . EXPLORATORY LAPAROTOMY  05/13/2015  . GASTRIC ROUX-EN-Y N/A 10/06/2014   Procedure: LAPAROSCOPIC ROUX-EN-Y GASTRIC BYPASS WITH UPPER ENDOSCOPY;  Surgeon: Pedro Earls, MD;  [facility];  Service: General;  Laterality: N/A;  . LAPAROSCOPY N/A 05/12/2015   Procedure: LAPAROSCOPY REPAIR OF PERFORATED BOWEL;  Surgeon: Alphonsa Overall, MD;  Location: [place];  Service: General;  Laterality: N/A;  . MASTECTOMY WITH RADIOACTIVE SEED GUIDED  EXCISION AND AXILLARY SENTINEL LYMPH NODE BIOPSY Bilateral 01/22/2018   Procedure: BILATERAL MASTECTOMIES WITH RADIOACTIVE SEED TARGETED RIGHT AXILLARY [place] NODE EXCISION AND RIGHT SENTINEL LYMPH NODE BIOPSY;  Surgeon: Alphonsa Overall, MD;  Location: [place];  Service: General;  Laterality: Bilateral;  . PORT-A-CATH REMOVAL Left 01/22/2018   Procedure: REMOVAL PORT-A-CATH;  Surgeon: Alphonsa Overall, MD;  Location: [place];  Service: General;  Laterality: Left;  . PORTACATH PLACEMENT N/A 08/02/2017   Procedure: INSERTION PORT-A-CATH;  Surgeon: Alphonsa Overall, MD;  Location: [place];  Service: General;  Laterality: N/A;  . UTERINE ARTERY EMBOLIZATION  08-16-2010    There were no vitals filed for this visit.  Subjective Assessment - 03/19/18 0852    Subjective  The shoulder is good. I have been really stretching this weekend. I hope I get a good outcome today.     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 It measures 1 cm and is located in the upper outer quadrant with a Ki67 of 90%. She had 3 abnormal looking axillary nodes on ultrasound and 1 was biopsied and found to be positive, 01/22/18- underwent bilateral mastectomy and a SLNB on the right side with 6/6 were negative, pt has completed chemotherapy 12/14/17, pt is going to start radiation next week    Patient Stated  Goals  to get full ROM in the right arm    Currently in Pain?  No/denies    Pain Score  0-No pain         [facility] PT Assessment - 03/19/18 0001      AROM   Right Shoulder Flexion  152 Degrees    Right Shoulder ABduction  160 Degrees                   [facility] Adult PT Treatment/Exercise - 03/19/18 0001      Shoulder Exercises: Standing   External Rotation  Strengthening;Both;10 reps;Theraband pt returned therapist demo    Theraband Level (Shoulder External Rotation)  Level 2 (Red)    Flexion  Strengthening;Both;10  reps;Theraband pt returned therapist demo    Theraband Level (Shoulder Flexion)  Level 2 (Red)    Extension  Strengthening;Both;10 reps;Theraband pt returned therapist demo    Other Standing Exercises  Internal rotation with red band bilaterally x 10- pt returned therapist demo      Shoulder Exercises: Pulleys   Flexion  2 minutes with verbal cues for good posture    ABduction  2 minutes returned therapist demonstration      Shoulder Exercises: Therapy Ball   Flexion  Both;10 reps returned therapist demonstration    ABduction  Right;10 reps returned therapist demonstration      Manual Therapy   Soft tissue mobilization  to right and left pec with biotone in area of tightness, pt reported relief following this    Myofascial Release  gently along area surrounding scar to right mastectomy scars with some improved mobility noted    Scapular Mobilization  in left sidelying to right scapula in direction of retraction and protraction, depression and elevation while gently moving arm into flexion                  PT Long Term Goals - 02/21/18 1213      PT LONG TERM GOAL #1   Title  Pt will be able to independently verbalize lymphedema risk reduction practices    Time  4    Period  Weeks    Status  New    Target Date  03/21/18      PT LONG TERM GOAL #2   Title  Pt will demonstrate improved right shoulder abduction of at least 160 degrees for increased ease of obtaining position for radiation    Baseline  97    Time  4    Period  Weeks    Status  New    Target Date  03/21/18      PT LONG TERM GOAL #3   Title  Pt will demonstrate improved right shoulder flexion to greater than 150 degrees to allow her to reach items on the top shelf    Baseline  137    Time  4    Period  Weeks    Status  New    Target Date  03/21/18      PT LONG TERM GOAL #4   Title  Pt will obtain a prophylactic compression sleeve and gauntlet from DME supplier to wear when she flies    Time  4     Period  Weeks    Status  New    Target Date  03/21/18      PT LONG TERM GOAL #5   Title  Pt will be independent in a home exercise program for continued strengthening and stretching  Time  4    Period  Weeks    Status  New    Target Date  03/21/18            Plan - 03/19/18 1215    Clinical Impression Statement  Pt states she spent a lot of time stretching over the weekend and is having less shoulder pain. She demonstrates marked improvement with her shoulder abduction ROM today with no pain when she moves it actively. Continued with soft tissue moblization to bilateral pecs because pt reports this has been getting more tight with radiation and scapular mobilization to help decrease shoulder pain. Instructed pt in rockwood strengthening exercises to help strengthening shoulder in pain free range.     Rehab Potential  Good    Clinical Impairments Affecting Rehab Potential  pt to begin radiation    PT Frequency  2x / week    PT Duration  4 weeks    PT Treatment/Interventions  Patient/family education;Passive range of motion;Manual techniques;Taping;Therapeutic exercise;Therapeutic activities;ADLs/Self Care Home Management;Scar mobilization    PT Next Visit Plan  update POC if it is time, assess indep with rockwood, continue gentle AA/A/PROM To R shoulder, continue pulleys and ball and scap mobs, STM to R and L  pec    PT Home Exercise Plan  post op breast exercises, supine dowel, supine protraction    Consulted and Agree with Plan of Care  Patient       Patient will benefit from skilled therapeutic intervention in order to improve the following deficits and impairments:  Decreased range of motion, Impaired UE functional use, Pain, Decreased knowledge of precautions, Postural dysfunction, Decreased strength, Increased fascial restricitons  Visit Diagnosis: Stiffness of right shoulder, not elsewhere classified  Abnormal posture  Acute pain of right shoulder  Muscle weakness  (generalized)     Problem List Patient Active Problem List   Diagnosis Date Noted  . Cancer of right breast, stage 2 ([place]) 01/22/2018  . Genetic testing 08/18/2017  . BRCA2 positive 08/18/2017  . Family history of breast cancer   . Family history of ovarian cancer   . Family history of prostate cancer   . Malignant neoplasm of upper-outer quadrant of right breast in female, estrogen receptor negative ([place]) 07/25/2017  . Perforated bowel ([place]) 05/13/2015  . Free intraperitoneal air 05/12/2015  . Submucosal lesion of esophagus 10/06/2014  . 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 Diabetes ([place]) 09/24/2014  . Morbid obesity ([place]) 03/20/2014    Kara Crawford [facility] 03/19/2018, 12:18 PM  [place], [place], [address] Phone: [phone]   Fax:  [phone]  Name: Kara Crawford MRN: [id number] Date of Birth: 1966-01-07  Manus Gunning, PT 03/19/18 12:18 PM

## 2018-03-20 ENCOUNTER — Ambulatory Visit
Admission: RE | Admit: 2018-03-20 | Discharge: 2018-03-20 | Disposition: A | Discharge status: Home / Self Care | Payer: 59 | Source: Ambulatory Visit | Attending: Radiation Oncology | Admitting: Radiation Oncology

## 2018-03-20 DIAGNOSIS — Z510 Encounter for antineoplastic radiation therapy: Secondary | ICD-10-CM | POA: Diagnosis not present

## 2018-03-20 DIAGNOSIS — M25611 Stiffness of right shoulder, not elsewhere classified: Secondary | ICD-10-CM | POA: Diagnosis not present

## 2018-03-20 DIAGNOSIS — C50411 Malignant neoplasm of upper-outer quadrant of right female breast: Secondary | ICD-10-CM | POA: Diagnosis not present

## 2018-03-21 ENCOUNTER — Ambulatory Visit
Admission: RE | Admit: 2018-03-21 | Discharge: 2018-03-21 | Disposition: A | Discharge status: Home / Self Care | Payer: 59 | Source: Ambulatory Visit | Attending: Radiation Oncology | Admitting: Radiation Oncology

## 2018-03-21 ENCOUNTER — Encounter: Payer: Self-pay | Admitting: Physical Therapy

## 2018-03-21 ENCOUNTER — Ambulatory Visit: Payer: 59 | Admitting: Physical Therapy

## 2018-03-21 DIAGNOSIS — M25511 Pain in right shoulder: Secondary | ICD-10-CM

## 2018-03-21 DIAGNOSIS — M6281 Muscle weakness (generalized): Secondary | ICD-10-CM

## 2018-03-21 DIAGNOSIS — R293 Abnormal posture: Secondary | ICD-10-CM

## 2018-03-21 DIAGNOSIS — C50411 Malignant neoplasm of upper-outer quadrant of right female breast: Secondary | ICD-10-CM | POA: Diagnosis not present

## 2018-03-21 DIAGNOSIS — Z510 Encounter for antineoplastic radiation therapy: Secondary | ICD-10-CM | POA: Diagnosis not present

## 2018-03-21 DIAGNOSIS — M25611 Stiffness of right shoulder, not elsewhere classified: Secondary | ICD-10-CM | POA: Diagnosis not present

## 2018-03-21 NOTE — Therapy (Signed)
[place], [place], [address] Phone: [phone]   Fax:  [phone]  Physical Therapy Treatment  Patient Details  Name: Kara Crawford MRN: [id number] Date of Birth: 24-Sep-1966 Referring Provider: Dr. Isidore Moos   Encounter Date: 03/21/2018  PT End of Session - 03/21/18 0934    Visit Number  9    Number of Visits  17    Date for PT Re-Evaluation  04/18/18    PT Start Time  0850    PT Stop Time  0934    PT Time Calculation (min)  44 min    Activity Tolerance  Patient tolerated treatment well    Behavior During Therapy  [facility] for tasks assessed/performed       Past Medical History:  Diagnosis Date  . Anemia    anemia prior to uterine emboliztion procedure.  . Breast cancer ([place])   . Diabetes mellitus without complication ([place])    type 2  . Family history of breast cancer   . Family history of ovarian cancer   . Family history of prostate cancer   . Fibroids   . Headache   . Heart murmur   . Hyperlipidemia   . Hypertension     Past Surgical History:  Procedure Laterality Date  . BREATH TEK H PYLORI N/A 04/17/2014   Procedure: BREATH TEK H PYLORI;  Surgeon: Shann Medal, MD;  Location: [facility];  Service: General;  Laterality: N/A;  . CESAREAN SECTION     x3- normal development  . ESOPHAGOGASTRODUODENOSCOPY N/A 08/20/2015   Procedure: ESOPHAGOGASTRODUODENOSCOPY (EGD);  Surgeon: Alphonsa Overall, MD;  Location: [facility];  Service: General;  Laterality: N/A;  . EXPLORATORY LAPAROTOMY  05/13/2015  . GASTRIC ROUX-EN-Y N/A 10/06/2014   Procedure: LAPAROSCOPIC ROUX-EN-Y GASTRIC BYPASS WITH UPPER ENDOSCOPY;  Surgeon: Pedro Earls, MD;  [facility];  Service: General;  Laterality: N/A;  . LAPAROSCOPY N/A 05/12/2015   Procedure: LAPAROSCOPY REPAIR OF PERFORATED BOWEL;  Surgeon: Alphonsa Overall, MD;  Location: [place];  Service: General;  Laterality: N/A;  . MASTECTOMY WITH RADIOACTIVE SEED GUIDED  EXCISION AND AXILLARY SENTINEL LYMPH NODE BIOPSY Bilateral 01/22/2018   Procedure: BILATERAL MASTECTOMIES WITH RADIOACTIVE SEED TARGETED RIGHT AXILLARY [place] NODE EXCISION AND RIGHT SENTINEL LYMPH NODE BIOPSY;  Surgeon: Alphonsa Overall, MD;  Location: [place];  Service: General;  Laterality: Bilateral;  . PORT-A-CATH REMOVAL Left 01/22/2018   Procedure: REMOVAL PORT-A-CATH;  Surgeon: Alphonsa Overall, MD;  Location: [place];  Service: General;  Laterality: Left;  . PORTACATH PLACEMENT N/A 08/02/2017   Procedure: INSERTION PORT-A-CATH;  Surgeon: Alphonsa Overall, MD;  Location: [facility] [place];  Service: General;  Laterality: N/A;  . UTERINE ARTERY EMBOLIZATION  08-16-2010    There were no vitals filed for this visit.  Subjective Assessment - 03/21/18 0852    Subjective  My shoulder is okay. I am still having some pain in my shoulder.     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 It measures 1 cm and is located in the upper outer quadrant with a Ki67 of 90%. She had 3 abnormal looking axillary nodes on ultrasound and 1 was biopsied and found to be positive, 01/22/18- underwent bilateral mastectomy and a SLNB on the right side with 6/6 were negative, pt has completed chemotherapy 12/14/17, pt is going to start radiation next week    Patient Stated Goals  to get full ROM  in the right arm    Currently in Pain?  No/denies    Pain Score  0-No pain                       [facility] Adult PT Treatment/Exercise - 03/21/18 0001      Shoulder Exercises: Standing   External Rotation  Strengthening;Both;10 reps;Theraband pt able to correctly demonstrate today with no cues    Theraband Level (Shoulder External Rotation)  Level 2 (Red)    Flexion  Strengthening;Both;10 reps;Theraband pt able to correctly demonstrate today with no cues    Theraband Level (Shoulder Flexion)  Level 2 (Red)    Extension   Strengthening;Both;10 reps;Theraband pt able to demonstrate with no cueing    Other Standing Exercises  Internal rotation with red band bilaterally x 10- pt able to demonstrate today with no cueing      Shoulder Exercises: Pulleys   Flexion  2 minutes with verbal cues for good posture    ABduction  2 minutes returned therapist demonstration      Shoulder Exercises: Therapy Ball   Flexion  Both;10 reps returned therapist demonstration    ABduction  Right;10 reps returned therapist demonstration      Manual Therapy   Myofascial Release  gently along area surrounding scar to right and left mastectomy scars with some improved mobility noted    Scapular Mobilization  in left sidelying to right scapula in direction of retraction and protraction, depression and elevation while gently moving arm into flexion                  PT Long Term Goals - 03/21/18 0854      PT LONG TERM GOAL #1   Title  Pt will be able to independently verbalize lymphedema risk reduction practices    Baseline  03/21/18- pt able to independently verbalize this    Time  4    Period  Weeks    Status  Achieved      PT LONG TERM GOAL #2   Title  Pt will demonstrate improved right shoulder abduction of at least 160 degrees for increased ease of obtaining position for radiation    Baseline  97, 03/21/18- 151    Time  4    Period  Weeks    Status  On-going      PT LONG TERM GOAL #3   Title  Pt will demonstrate improved right shoulder flexion to greater than 150 degrees to allow her to reach items on the top shelf    Baseline  137, 03/21/18- 162    Time  4    Period  Weeks    Status  Achieved      PT LONG TERM GOAL #4   Title  Pt will obtain a prophylactic compression sleeve and gauntlet from DME supplier to wear when she flies    Baseline  03/21/18- pt is working on obtaining this     Time  4    Period  Weeks    Status  On-going      PT LONG TERM GOAL #5   Title  Pt will be independent in a home exercise  program for continued strengthening and stretching    Time  4    Period  Weeks    Status  On-going            Plan - 03/21/18 0935    Clinical Impression Statement  Assessed pt's progress towards goals  in therapy today. She is progressing towards goals in therapy and has met her flexion ROM goal and her lymphedema risk reduction goal. Pt is still having some pain in her right shoulder with stretching. Will add iontophoresis to plan of care to help decrease pain in this area. Continued with myofascial release to bilateral mastecotmy scars and pt demonstrates improving scar mobility. Pt is demonstrating increased independence with her home exercise program. She would benefit from continued skilled PT visits to progress pt's home exercise program, decrease right shoulder pain and improve right shoulder abduction ROM.     Rehab Potential  Good    Clinical Impairments Affecting Rehab Potential  pt to begin radiation    PT Frequency  2x / week    PT Duration  4 weeks    PT Treatment/Interventions  Patient/family education;Passive range of motion;Manual techniques;Taping;Therapeutic exercise;Therapeutic activities;ADLs/Self Care Home Management;Scar mobilization;Iontophoresis 76m/ml Dexamethasone    PT Next Visit Plan   continue gentle AA/A/PROM To R shoulder, continue pulleys and ball and scap mobs, STM to R and L  pec, if cert signed add ionto to right shoulder    PT Home Exercise Plan  post op breast exercises, supine dowel, supine protraction, rockwood    Consulted and Agree with Plan of Care  Patient       Patient will benefit from skilled therapeutic intervention in order to improve the following deficits and impairments:  Decreased range of motion, Impaired UE functional use, Pain, Decreased knowledge of precautions, Postural dysfunction, Decreased strength, Increased fascial restricitons  Visit Diagnosis: Stiffness of right shoulder, not elsewhere classified - Plan: PT plan of care  cert/re-cert  Abnormal posture - Plan: PT plan of care cert/re-cert  Acute pain of right shoulder - Plan: PT plan of care cert/re-cert  Muscle weakness (generalized) - Plan: PT plan of care cert/re-cert     Problem List Patient Active Problem List   Diagnosis Date Noted  . Cancer of right breast, stage 2 ([place] 01/22/2018  . Genetic testing 08/18/2017  . BRCA2 positive 08/18/2017  . Family history of breast cancer   . Family history of ovarian cancer   . Family history of prostate cancer   . Malignant neoplasm of upper-outer quadrant of right breast in female, estrogen receptor negative ([place] 07/25/2017  . Perforated bowel ([place] 05/13/2015  . Free intraperitoneal air 05/12/2015  . Submucosal lesion of esophagus 10/06/2014  . 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 Diabetes ([place] 09/24/2014  . Morbid obesity ([place] 03/20/2014    BAllyson SabalBBerkeley Medical Center5/22/2019, 11:16 AM  [place] [address]Phone: [phone]  Fax:  [phone] Name: PTonie ElseyMRN: [id number] of Birth: 801/12/1965 BManus Gunning PT 03/21/18 11:16 AM

## 2018-03-22 ENCOUNTER — Ambulatory Visit
Admission: RE | Admit: 2018-03-22 | Discharge: 2018-03-22 | Disposition: A | Discharge status: Home / Self Care | Payer: 59 | Source: Ambulatory Visit | Attending: Radiation Oncology | Admitting: Radiation Oncology

## 2018-03-22 DIAGNOSIS — Z510 Encounter for antineoplastic radiation therapy: Secondary | ICD-10-CM | POA: Diagnosis not present

## 2018-03-22 DIAGNOSIS — C50411 Malignant neoplasm of upper-outer quadrant of right female breast: Secondary | ICD-10-CM | POA: Diagnosis not present

## 2018-03-23 ENCOUNTER — Ambulatory Visit
Admission: RE | Admit: 2018-03-23 | Discharge: 2018-03-23 | Disposition: A | Discharge status: Home / Self Care | Payer: 59 | Source: Ambulatory Visit | Attending: Radiation Oncology | Admitting: Radiation Oncology

## 2018-03-23 DIAGNOSIS — C50411 Malignant neoplasm of upper-outer quadrant of right female breast: Secondary | ICD-10-CM | POA: Diagnosis not present

## 2018-03-23 DIAGNOSIS — Z510 Encounter for antineoplastic radiation therapy: Secondary | ICD-10-CM | POA: Diagnosis not present

## 2018-03-27 ENCOUNTER — Ambulatory Visit
Admission: RE | Admit: 2018-03-27 | Discharge: 2018-03-27 | Disposition: A | Discharge status: Home / Self Care | Payer: 59 | Source: Ambulatory Visit | Attending: Radiation Oncology | Admitting: Radiation Oncology

## 2018-03-27 DIAGNOSIS — C50411 Malignant neoplasm of upper-outer quadrant of right female breast: Secondary | ICD-10-CM | POA: Diagnosis not present

## 2018-03-27 DIAGNOSIS — Z510 Encounter for antineoplastic radiation therapy: Secondary | ICD-10-CM | POA: Diagnosis not present

## 2018-03-28 ENCOUNTER — Ambulatory Visit
Admission: RE | Admit: 2018-03-28 | Discharge: 2018-03-28 | Disposition: A | Discharge status: Home / Self Care | Payer: 59 | Source: Ambulatory Visit | Attending: Radiation Oncology | Admitting: Radiation Oncology

## 2018-03-28 DIAGNOSIS — Z510 Encounter for antineoplastic radiation therapy: Secondary | ICD-10-CM | POA: Diagnosis not present

## 2018-03-28 DIAGNOSIS — C50411 Malignant neoplasm of upper-outer quadrant of right female breast: Secondary | ICD-10-CM | POA: Diagnosis not present

## 2018-03-29 ENCOUNTER — Ambulatory Visit
Admission: RE | Admit: 2018-03-29 | Discharge: 2018-03-29 | Disposition: A | Discharge status: Home / Self Care | Payer: 59 | Source: Ambulatory Visit | Attending: Radiation Oncology | Admitting: Radiation Oncology

## 2018-03-29 DIAGNOSIS — C50411 Malignant neoplasm of upper-outer quadrant of right female breast: Secondary | ICD-10-CM | POA: Diagnosis not present

## 2018-03-29 DIAGNOSIS — Z510 Encounter for antineoplastic radiation therapy: Secondary | ICD-10-CM | POA: Diagnosis not present

## 2018-03-30 ENCOUNTER — Ambulatory Visit
Admission: RE | Admit: 2018-03-30 | Discharge: 2018-03-30 | Disposition: A | Discharge status: Home / Self Care | Payer: 59 | Source: Ambulatory Visit | Attending: Radiation Oncology | Admitting: Radiation Oncology

## 2018-03-30 DIAGNOSIS — Z510 Encounter for antineoplastic radiation therapy: Secondary | ICD-10-CM | POA: Diagnosis not present

## 2018-03-30 DIAGNOSIS — C50411 Malignant neoplasm of upper-outer quadrant of right female breast: Secondary | ICD-10-CM | POA: Diagnosis not present

## 2018-04-02 ENCOUNTER — Ambulatory Visit: Payer: 59 | Attending: Radiation Oncology | Admitting: Physical Therapy

## 2018-04-02 ENCOUNTER — Ambulatory Visit
Admission: RE | Admit: 2018-04-02 | Discharge: 2018-04-02 | Disposition: A | Discharge status: Home / Self Care | Payer: 59 | Source: Ambulatory Visit | Attending: Radiation Oncology | Admitting: Radiation Oncology

## 2018-04-02 ENCOUNTER — Encounter: Payer: Self-pay | Admitting: Physical Therapy

## 2018-04-02 DIAGNOSIS — M25511 Pain in right shoulder: Secondary | ICD-10-CM | POA: Diagnosis not present

## 2018-04-02 DIAGNOSIS — Z171 Estrogen receptor negative status [ER-]: Secondary | ICD-10-CM | POA: Diagnosis not present

## 2018-04-02 DIAGNOSIS — R293 Abnormal posture: Secondary | ICD-10-CM | POA: Insufficient documentation

## 2018-04-02 DIAGNOSIS — Z510 Encounter for antineoplastic radiation therapy: Secondary | ICD-10-CM | POA: Diagnosis not present

## 2018-04-02 DIAGNOSIS — C50411 Malignant neoplasm of upper-outer quadrant of right female breast: Secondary | ICD-10-CM | POA: Diagnosis not present

## 2018-04-02 DIAGNOSIS — M6281 Muscle weakness (generalized): Secondary | ICD-10-CM | POA: Diagnosis present

## 2018-04-02 DIAGNOSIS — M25611 Stiffness of right shoulder, not elsewhere classified: Secondary | ICD-10-CM | POA: Insufficient documentation

## 2018-04-02 DIAGNOSIS — R600 Localized edema: Secondary | ICD-10-CM | POA: Insufficient documentation

## 2018-04-02 NOTE — Patient Instructions (Signed)
Access Code: [email or web address]  URL: [email or web address]  Date: 04/02/2018  Prepared by: Manus Gunning   Exercises  Standing Median Nerve Glide - 3 reps - 1 sets - 15-30 sec hold - 1x daily - 7x weekly  Supine Single Arm Shoulder Protraction - 10 reps - 1 sets - 1x daily - 7x weekly  Sidelying Shoulder Abduction Palm Forward - 10 reps - 1 sets - 5 hold - 1x daily - 7x weekly

## 2018-04-02 NOTE — Therapy (Signed)
Progress Note Reporting Period 02/21/18 to 04/02/18   See note below for Objective Data and Assessment of Progress/Goals.      [place], [place], [address] Phone: [phone]   Fax:  [phone]  Physical Therapy Treatment  Patient Details  Name: Kara Crawford MRN: [id number] Date of Birth: 12/09/1965 Referring Provider: Dr. Isidore Moos   Encounter Date: 04/02/2018  PT End of Session - 04/02/18 1154    Visit Number  10    Number of Visits  17    Date for PT Re-Evaluation  04/18/18    PT Start Time  1102    PT Stop Time  1152    PT Time Calculation (min)  50 min    Activity Tolerance  Patient tolerated treatment well    Behavior During Therapy  [facility] for tasks assessed/performed       Past Medical History:  Diagnosis Date  . Anemia    anemia prior to uterine emboliztion procedure.  . Breast cancer ([place])   . Diabetes mellitus without complication ([place])    type 2  . Family history of breast cancer   . Family history of ovarian cancer   . Family history of prostate cancer   . Fibroids   . Headache   . Heart murmur   . Hyperlipidemia   . Hypertension     Past Surgical History:  Procedure Laterality Date  . BREATH TEK H PYLORI N/A 04/17/2014   Procedure: BREATH TEK H PYLORI;  Surgeon: Shann Medal, MD;  Location: [facility];  Service: General;  Laterality: N/A;  . CESAREAN SECTION     x3- normal development  . ESOPHAGOGASTRODUODENOSCOPY N/A 08/20/2015   Procedure: ESOPHAGOGASTRODUODENOSCOPY (EGD);  Surgeon: Alphonsa Overall, MD;  Location: [facility];  Service: General;  Laterality: N/A;  . EXPLORATORY LAPAROTOMY  05/13/2015  . GASTRIC ROUX-EN-Y N/A 10/06/2014   Procedure: LAPAROSCOPIC ROUX-EN-Y GASTRIC BYPASS WITH UPPER ENDOSCOPY;  Surgeon: Pedro Earls, MD;  [facility];  Service: General;  Laterality: N/A;  . LAPAROSCOPY N/A 05/12/2015   Procedure: LAPAROSCOPY REPAIR OF  PERFORATED BOWEL;  Surgeon: Alphonsa Overall, MD;  Location: [place];  Service: General;  Laterality: N/A;  . MASTECTOMY WITH RADIOACTIVE SEED GUIDED EXCISION AND AXILLARY SENTINEL LYMPH NODE BIOPSY Bilateral 01/22/2018   Procedure: BILATERAL MASTECTOMIES WITH RADIOACTIVE SEED TARGETED RIGHT AXILLARY [place] NODE EXCISION AND RIGHT SENTINEL LYMPH NODE BIOPSY;  Surgeon: Alphonsa Overall, MD;  Location: [place];  Service: General;  Laterality: Bilateral;  . PORT-A-CATH REMOVAL Left 01/22/2018   Procedure: REMOVAL PORT-A-CATH;  Surgeon: Alphonsa Overall, MD;  Location: [place];  Service: General;  Laterality: Left;  . PORTACATH PLACEMENT N/A 08/02/2017   Procedure: INSERTION PORT-A-CATH;  Surgeon: Alphonsa Overall, MD;  Location: [facility];  Service: General;  Laterality: N/A;  . UTERINE ARTERY EMBOLIZATION  08-16-2010    There were no vitals filed for this visit.  Subjective Assessment - 04/02/18 1104    Subjective  I couldn't get in last week due to scheduling so I tried to do a lot of stretching.     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 It measures 1 cm and is located in the upper outer quadrant with a Ki67 of 90%. She had 3 abnormal looking axillary nodes on ultrasound and 1 was biopsied and found to be positive, 01/22/18- underwent bilateral mastectomy and a SLNB on the right  side with 6/6 were negative, pt has completed chemotherapy 12/14/17, pt is going to start radiation next week    Patient Stated Goals  to get full ROM in the right arm    Currently in Pain?  No/denies    Pain Score  0-No pain         [facility] PT Assessment - 04/02/18 0001      AROM   Right Shoulder Flexion  156 Degrees    Right Shoulder ABduction  142 Degrees                   [facility] Adult PT Treatment/Exercise - 04/02/18 0001      Shoulder Exercises: Supine   Protraction  Strengthening;Right;10 reps;Weights    Protraction  Weight (lbs)  2      Shoulder Exercises: Sidelying   ABduction  AROM;Right;20 reps with 5 sec holds      Shoulder Exercises: Standing   Other Standing Exercises  median nerve stretch against wall x 1 rep x 30 sec      Shoulder Exercises: Pulleys   Flexion  2 minutes with verbal cues for good posture    ABduction  2 minutes returned therapist demonstration      Shoulder Exercises: Therapy Ball   Flexion  Both;10 reps returned therapist demonstration    ABduction  Right;10 reps returned therapist demonstration      Manual Therapy   Manual Therapy  Neural Stretch    Soft tissue mobilization  gently along right mastectomy scar in area of swelling    Myofascial Release  gently to right axilla in area of cording    Scapular Mobilization  in left sidelying to right scapula in direction of retraction and protraction, depression and elevation while gently moving arm into flexion    Passive ROM  to right shoulder in direction of flexion and scaption- avoided abduction secondary to increased shoulder pain    Neural Stretch  median nerve stretch to RUE in supine to help stretch cording                  PT Long Term Goals - 03/21/18 0854      PT LONG TERM GOAL #1   Title  Pt will be able to independently verbalize lymphedema risk reduction practices    Baseline  03/21/18- pt able to independently verbalize this    Time  4    Period  Weeks    Status  Achieved      PT LONG TERM GOAL #2   Title  Pt will demonstrate improved right shoulder abduction of at least 160 degrees for increased ease of obtaining position for radiation    Baseline  97, 03/21/18- 151    Time  4    Period  Weeks    Status  On-going      PT LONG TERM GOAL #3   Title  Pt will demonstrate improved right shoulder flexion to greater than 150 degrees to allow her to reach items on the top shelf    Baseline  137, 03/21/18- 162    Time  4    Period  Weeks    Status  Achieved      PT LONG TERM GOAL #4   Title   Pt will obtain a prophylactic compression sleeve and gauntlet from DME supplier to wear when she flies    Baseline  03/21/18- pt is working on obtaining this     Time  4  Period  Weeks    Status  On-going      PT LONG TERM GOAL #5   Title  Pt will be independent in a home exercise program for continued strengthening and stretching    Time  4    Period  Weeks    Status  On-going            Plan - 04/02/18 1154    Clinical Impression Statement  Pt was unable to attend therapy last week due to scheduling problems. Reassessed active ROM today and pt demonstrates slightly improved flexion but has lost several degrees of abduction. Added sidelying abduction to pt's HEP to improve this ROM without shoulder pain, also issued supine protraction for R shoulder and median nerve stretch for cording. Pt presents with several cords in her right axilla. She still has R shoulder pain with pure abduction PROM so today it was performed in the plane of scaption. Continued with scapular mobilizations.    Rehab Potential  Good    Clinical Impairments Affecting Rehab Potential  pt to begin radiation    PT Frequency  2x / week    PT Duration  4 weeks    PT Treatment/Interventions  Patient/family education;Passive range of motion;Manual techniques;Taping;Therapeutic exercise;Therapeutic activities;ADLs/Self Care Home Management;Scar mobilization;Iontophoresis '4mg'$ /ml Dexamethasone    PT Next Visit Plan   continue gentle AA/A/PROM To R shoulder, continue pulleys and ball and scap mobs, STM to R and L  pec, if cert signed add ionto to right shoulder    PT Home Exercise Plan  post op breast exercises, supine dowel, supine protraction, rockwood    Consulted and Agree with Plan of Care  Patient       Patient will benefit from skilled therapeutic intervention in order to improve the following deficits and impairments:  Decreased range of motion, Impaired UE functional use, Pain, Decreased knowledge of precautions,  Postural dysfunction, Decreased strength, Increased fascial restricitons  Visit Diagnosis: Stiffness of right shoulder, not elsewhere classified  Abnormal posture  Acute pain of right shoulder  Muscle weakness (generalized)     Problem List Patient Active Problem List   Diagnosis Date Noted  . Cancer of right breast, stage 2 ([place]) 01/22/2018  . Genetic testing 08/18/2017  . BRCA2 positive 08/18/2017  . Family history of breast cancer   . Family history of ovarian cancer   . Family history of prostate cancer   . Malignant neoplasm of upper-outer quadrant of right breast in female, estrogen receptor negative ([place]) 07/25/2017  . Perforated bowel ([place]) 05/13/2015  . Free intraperitoneal air 05/12/2015  . Submucosal lesion of esophagus 10/06/2014  . 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 Diabetes ([place]) 09/24/2014  . Morbid obesity ([place]) 03/20/2014    Allyson Sabal [facility] 04/02/2018, 12:01 PM  [place], [place], [address] Phone: [phone]   Fax:  [phone]  Name: Kara Crawford MRN: [id number] Date of Birth: 10-16-1966  Manus Gunning, PT 04/02/18 12:02 PM

## 2018-04-03 ENCOUNTER — Ambulatory Visit
Admission: RE | Admit: 2018-04-03 | Discharge: 2018-04-03 | Disposition: A | Discharge status: Home / Self Care | Payer: 59 | Source: Ambulatory Visit | Attending: Radiation Oncology | Admitting: Radiation Oncology

## 2018-04-03 DIAGNOSIS — C50411 Malignant neoplasm of upper-outer quadrant of right female breast: Secondary | ICD-10-CM | POA: Diagnosis not present

## 2018-04-03 DIAGNOSIS — Z510 Encounter for antineoplastic radiation therapy: Secondary | ICD-10-CM | POA: Diagnosis not present

## 2018-04-03 DIAGNOSIS — M25611 Stiffness of right shoulder, not elsewhere classified: Secondary | ICD-10-CM | POA: Diagnosis not present

## 2018-04-04 ENCOUNTER — Ambulatory Visit
Admission: RE | Admit: 2018-04-04 | Discharge: 2018-04-04 | Disposition: A | Discharge status: Home / Self Care | Payer: 59 | Source: Ambulatory Visit | Attending: Radiation Oncology | Admitting: Radiation Oncology

## 2018-04-04 ENCOUNTER — Ambulatory Visit: Payer: 59 | Admitting: Physical Therapy

## 2018-04-04 ENCOUNTER — Encounter: Payer: Self-pay | Admitting: Physical Therapy

## 2018-04-04 DIAGNOSIS — Z510 Encounter for antineoplastic radiation therapy: Secondary | ICD-10-CM | POA: Diagnosis not present

## 2018-04-04 DIAGNOSIS — R293 Abnormal posture: Secondary | ICD-10-CM

## 2018-04-04 DIAGNOSIS — M25611 Stiffness of right shoulder, not elsewhere classified: Secondary | ICD-10-CM | POA: Diagnosis not present

## 2018-04-04 DIAGNOSIS — C50411 Malignant neoplasm of upper-outer quadrant of right female breast: Secondary | ICD-10-CM | POA: Diagnosis not present

## 2018-04-04 DIAGNOSIS — R600 Localized edema: Secondary | ICD-10-CM

## 2018-04-04 DIAGNOSIS — M25511 Pain in right shoulder: Secondary | ICD-10-CM

## 2018-04-04 NOTE — Therapy (Signed)
[place], [place], [address] Phone: [phone]   Fax:  [phone]  Physical Therapy Treatment  Patient Details  Name: Kara Crawford MRN: [id number] Date of Birth: [birth year] Referring Provider: Dr. Isidore Moos   Encounter Date: 04/04/2018  PT End of Session - 04/04/18 1200    Visit Number  11    Number of Visits  17    Date for PT Re-Evaluation  04/18/18    PT Start Time  1021    PT Stop Time  1103    PT Time Calculation (min)  42 min    Activity Tolerance  Patient tolerated treatment well    Behavior During Therapy  [facility] for tasks assessed/performed       Past Medical History:  Diagnosis Date  . Anemia    anemia prior to uterine emboliztion procedure.  . Breast cancer ([place])   . Diabetes mellitus without complication ([place])    type 2  . Family history of breast cancer   . Family history of ovarian cancer   . Family history of prostate cancer   . Fibroids   . Headache   . Heart murmur   . Hyperlipidemia   . Hypertension     Past Surgical History:  Procedure Laterality Date  . BREATH TEK H PYLORI N/A 04/17/2014   Procedure: BREATH TEK H PYLORI;  Surgeon: Shann Medal, MD;  [facility];  Service: General;  Laterality: N/A;  . CESAREAN SECTION     x3- normal development  . ESOPHAGOGASTRODUODENOSCOPY N/A 08/20/2015   Procedure: ESOPHAGOGASTRODUODENOSCOPY (EGD);  Surgeon: Alphonsa Overall, MD;  Location: [facility];  Service: General;  Laterality: N/A;  . EXPLORATORY LAPAROTOMY  05/13/2015  . GASTRIC ROUX-EN-Y N/A 10/06/2014   Procedure: LAPAROSCOPIC ROUX-EN-Y GASTRIC BYPASS WITH UPPER ENDOSCOPY;  Surgeon: Pedro Earls, MD;  [facility];  Service: General;  Laterality: N/A;  . LAPAROSCOPY N/A 05/12/2015   Procedure: LAPAROSCOPY REPAIR OF PERFORATED BOWEL;  Surgeon: Alphonsa Overall, MD;  Location: [place];  Service: General;  Laterality: N/A;  . MASTECTOMY WITH RADIOACTIVE SEED GUIDED  EXCISION AND AXILLARY SENTINEL LYMPH NODE BIOPSY Bilateral 01/22/2018   Procedure: BILATERAL MASTECTOMIES WITH RADIOACTIVE SEED TARGETED RIGHT AXILLARY [place] NODE EXCISION AND RIGHT SENTINEL LYMPH NODE BIOPSY;  Surgeon: Alphonsa Overall, MD;  Location: [place];  Service: General;  Laterality: Bilateral;  . PORT-A-CATH REMOVAL Left 01/22/2018   Procedure: REMOVAL PORT-A-CATH;  Surgeon: Alphonsa Overall, MD;  Location: [place];  Service: General;  Laterality: Left;  . PORTACATH PLACEMENT N/A 08/02/2017   Procedure: INSERTION PORT-A-CATH;  Surgeon: Alphonsa Overall, MD;  Location: [place];  Service: General;  Laterality: N/A;  . UTERINE ARTERY EMBOLIZATION  08-16-2010    There were no vitals filed for this visit.  Subjective Assessment - 04/04/18 1022    Subjective  I had to have some imaging done at radiation because they said I was having swelling. I only have shoulder pain when we do the stretching but I do not have it when I am not here.     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 It measures 1 cm and is located in the upper outer quadrant with a Ki67 of 90%. She had 3 abnormal looking axillary nodes on ultrasound and 1 was biopsied and found to be positive, 01/22/18- underwent bilateral mastectomy and a SLNB on the right side with 6/6 were negative,  pt has completed chemotherapy 12/14/17, pt is going to start radiation next week    Patient Stated Goals  to get full ROM in the right arm    Currently in Pain?  No/denies    Pain Score  0-No pain                       [facility] Adult PT Treatment/Exercise - 04/04/18 0001      Manual Therapy   Manual Therapy  Manual Lymphatic Drainage (MLD);Soft tissue mobilization;Scapular mobilization    Soft tissue mobilization  gently along right mastectomy scar in area of swelling    Scapular Mobilization  in left sidelying to right scapula in direction of  retraction and protraction, depression and elevation while gently moving arm into flexion    Manual Lymphatic Drainage (MLD)  in supine: short neck, 5 diaphragmatic breaths, left axillary nodes and establishment of interaxillary pathway, right inguinal nodes and establishement of axillo inguinal pathway, right chest moving fluid towards pathways and right lateral trunk moving fluid towards pathways                  PT Long Term Goals - 04/04/18 1023      PT LONG TERM GOAL #1   Title  Pt will be able to independently verbalize lymphedema risk reduction practices    Baseline  03/21/18- pt able to independently verbalize this    Time  4    Period  Weeks    Status  Achieved      PT LONG TERM GOAL #2   Title  Pt will demonstrate improved right shoulder abduction of at least 160 degrees for increased ease of obtaining position for radiation    Baseline  97, 03/21/18- 151, 04/04/18- 160    Time  4    Period  Weeks    Status  Achieved      PT LONG TERM GOAL #3   Title  Pt will demonstrate improved right shoulder flexion to greater than 150 degrees to allow her to reach items on the top shelf    Baseline  137, 03/21/18- 162    Time  4    Period  Weeks    Status  Achieved      PT LONG TERM GOAL #4   Title  Pt will obtain a prophylactic compression sleeve and gauntlet from DME supplier to wear when she flies    Baseline  03/21/18- pt is working on obtaining this, 04/04/18- pt needs to make an appointment to get measured    Time  4    Period  Weeks    Status  On-going      PT LONG TERM GOAL #5   Title  Pt will be independent in a home exercise program for continued strengthening and stretching    Time  4    Period  Weeks    Status  On-going            Plan - 04/04/18 1201    Clinical Impression Statement  Reassessed pt's progress towards goals in therapy. Pt has now met ROM goals but felt increase in fluid under arm and also that was noticed when she underwent radiation this  morning. Added MLD to help decrease swelling. Continued scapular mobilization to right shoulder where she has tightness.     Rehab Potential  Good    Clinical Impairments Affecting Rehab Potential  pt to begin radiation    PT Frequency  2x /  week    PT Duration  4 weeks    PT Treatment/Interventions  Patient/family education;Passive range of motion;Manual techniques;Taping;Therapeutic exercise;Therapeutic activities;ADLs/Self Care Home Management;Scar mobilization;Iontophoresis '4mg'$ /ml Dexamethasone    PT Next Visit Plan   continue gentle AA/A/PROM To R shoulder, continue pulleys and ball and scap mobs, STM to R and L  pec, if cert signed add ionto to right shoulder if pt is having pain    PT Home Exercise Plan  post op breast exercises, supine dowel, supine protraction, rockwood    Consulted and Agree with Plan of Care  Patient       Patient will benefit from skilled therapeutic intervention in order to improve the following deficits and impairments:  Decreased range of motion, Impaired UE functional use, Pain, Decreased knowledge of precautions, Postural dysfunction, Decreased strength, Increased fascial restricitons  Visit Diagnosis: Stiffness of right shoulder, not elsewhere classified  Abnormal posture  Acute pain of right shoulder  Localized edema     Problem List Patient Active Problem List   Diagnosis Date Noted  . Cancer of right breast, stage 2 ([place]) 01/22/2018  . Genetic testing 08/18/2017  . BRCA2 positive 08/18/2017  . Family history of breast cancer   . Family history of ovarian cancer   . Family history of prostate cancer   . Malignant neoplasm of upper-outer quadrant of right breast in female, estrogen receptor negative ([place]) 07/25/2017  . Perforated bowel ([place]) 05/13/2015  . Free intraperitoneal air 05/12/2015  . Submucosal lesion of esophagus 10/06/2014  . 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 Diabetes ([place])  09/24/2014  . Morbid obesity ([place]) 03/20/2014    Allyson Sabal [facility] 04/04/2018, 12:04 PM  [place], [place], [address] Phone: [phone]   Fax:  [phone]  Name: Graylyn Bunney MRN: [id number] Date of Birth: 14-Jul-1966  Manus Gunning, PT 04/04/18 12:04 PM

## 2018-04-05 ENCOUNTER — Ambulatory Visit
Admission: RE | Admit: 2018-04-05 | Discharge: 2018-04-05 | Disposition: A | Discharge status: Home / Self Care | Payer: 59 | Source: Ambulatory Visit | Attending: Radiation Oncology | Admitting: Radiation Oncology

## 2018-04-05 DIAGNOSIS — C50411 Malignant neoplasm of upper-outer quadrant of right female breast: Secondary | ICD-10-CM | POA: Diagnosis not present

## 2018-04-05 DIAGNOSIS — Z510 Encounter for antineoplastic radiation therapy: Secondary | ICD-10-CM | POA: Diagnosis not present

## 2018-04-06 ENCOUNTER — Ambulatory Visit
Admission: RE | Admit: 2018-04-06 | Discharge: 2018-04-06 | Disposition: A | Discharge status: Home / Self Care | Payer: 59 | Source: Ambulatory Visit | Attending: Radiation Oncology | Admitting: Radiation Oncology

## 2018-04-06 DIAGNOSIS — Z510 Encounter for antineoplastic radiation therapy: Secondary | ICD-10-CM | POA: Diagnosis not present

## 2018-04-06 DIAGNOSIS — M25611 Stiffness of right shoulder, not elsewhere classified: Secondary | ICD-10-CM | POA: Diagnosis not present

## 2018-04-06 DIAGNOSIS — C50411 Malignant neoplasm of upper-outer quadrant of right female breast: Secondary | ICD-10-CM | POA: Diagnosis not present

## 2018-04-09 ENCOUNTER — Ambulatory Visit: Payer: 59 | Admitting: Physical Therapy

## 2018-04-09 ENCOUNTER — Ambulatory Visit
Admission: RE | Admit: 2018-04-09 | Discharge: 2018-04-09 | Disposition: A | Discharge status: Home / Self Care | Payer: 59 | Source: Ambulatory Visit | Attending: Radiation Oncology | Admitting: Radiation Oncology

## 2018-04-09 ENCOUNTER — Encounter: Payer: Self-pay | Admitting: Physical Therapy

## 2018-04-09 DIAGNOSIS — C50411 Malignant neoplasm of upper-outer quadrant of right female breast: Secondary | ICD-10-CM | POA: Diagnosis not present

## 2018-04-09 DIAGNOSIS — R600 Localized edema: Secondary | ICD-10-CM

## 2018-04-09 DIAGNOSIS — M25611 Stiffness of right shoulder, not elsewhere classified: Secondary | ICD-10-CM | POA: Diagnosis not present

## 2018-04-09 DIAGNOSIS — R293 Abnormal posture: Secondary | ICD-10-CM

## 2018-04-09 DIAGNOSIS — M25511 Pain in right shoulder: Secondary | ICD-10-CM

## 2018-04-09 DIAGNOSIS — Z510 Encounter for antineoplastic radiation therapy: Secondary | ICD-10-CM | POA: Diagnosis not present

## 2018-04-09 NOTE — Therapy (Signed)
[place], [place], [address] Phone: [phone]   Fax:  [phone]  Physical Therapy Treatment  Patient Details  Name: Kara Crawford MRN: [id number] Date of Birth: Jun 06, 1966 Referring Provider: Dr. Isidore Moos   Encounter Date: 04/09/2018  PT End of Session - 04/09/18 1102    Visit Number  12    Number of Visits  17    Date for PT Re-Evaluation  04/18/18    PT Start Time  1018    PT Stop Time  1101    PT Time Calculation (min)  43 min    Activity Tolerance  Patient tolerated treatment well    Behavior During Therapy  [facility] for tasks assessed/performed       Past Medical History:  Diagnosis Date  . Anemia    anemia prior to uterine emboliztion procedure.  . Breast cancer ([place])   . Diabetes mellitus without complication ([place])    type 2  . Family history of breast cancer   . Family history of ovarian cancer   . Family history of prostate cancer   . Fibroids   . Headache   . Heart murmur   . Hyperlipidemia   . Hypertension     Past Surgical History:  Procedure Laterality Date  . BREATH TEK H PYLORI N/A 04/17/2014   Procedure: BREATH TEK H PYLORI;  Surgeon: Shann Medal, MD;  [facility];  Service: General;  Laterality: N/A;  . CESAREAN SECTION     x3- normal development  . ESOPHAGOGASTRODUODENOSCOPY N/A 08/20/2015   Procedure: ESOPHAGOGASTRODUODENOSCOPY (EGD);  Surgeon: Alphonsa Overall, MD;  [facility]: [facility];  Service: General;  Laterality: N/A;  . EXPLORATORY LAPAROTOMY  05/13/2015  . GASTRIC ROUX-EN-Y N/A 10/06/2014   Procedure: LAPAROSCOPIC ROUX-EN-Y GASTRIC BYPASS WITH UPPER ENDOSCOPY;  Surgeon: Pedro Earls, MD;  [facility];  Service: General;  Laterality: N/A;  . LAPAROSCOPY N/A 05/12/2015   Procedure: LAPAROSCOPY REPAIR OF PERFORATED BOWEL;  Surgeon: Alphonsa Overall, MD;  Location: [place];  Service: General;  Laterality: N/A;  . MASTECTOMY WITH RADIOACTIVE SEED GUIDED  EXCISION AND AXILLARY SENTINEL LYMPH NODE BIOPSY Bilateral 01/22/2018   Procedure: BILATERAL MASTECTOMIES WITH RADIOACTIVE SEED TARGETED RIGHT AXILLARY [place] NODE EXCISION AND RIGHT SENTINEL LYMPH NODE BIOPSY;  Surgeon: Alphonsa Overall, MD;  Location: [place];  Service: General;  Laterality: Bilateral;  . PORT-A-CATH REMOVAL Left 01/22/2018   Procedure: REMOVAL PORT-A-CATH;  Surgeon: Alphonsa Overall, MD;  Location: [place];  Service: General;  Laterality: Left;  . PORTACATH PLACEMENT N/A 08/02/2017   Procedure: INSERTION PORT-A-CATH;  Surgeon: Alphonsa Overall, MD;  Location: [place];  Service: General;  Laterality: N/A;  . UTERINE ARTERY EMBOLIZATION  08-16-2010    There were no vitals filed for this visit.  Subjective Assessment - 04/09/18 1020    Subjective  My shoulder is pretty good. Every morning when I get up it is tight across my chest and I have to stretch it out.     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 It measures 1 cm and is located in the upper outer quadrant with a Ki67 of 90%. She had 3 abnormal looking axillary nodes on ultrasound and 1 was biopsied and found to be positive, 01/22/18- underwent bilateral mastectomy and a SLNB on the right side with 6/6 were negative, pt has completed chemotherapy 12/14/17, pt is going to start radiation next week  Patient Stated Goals  to get full ROM in the right arm    Currently in Pain?  No/denies    Pain Score  0-No pain                       [facility] Adult PT Treatment/Exercise - 04/09/18 0001      Shoulder Exercises: Pulleys   Flexion  2 minutes with verbal cues for good posture    ABduction  2 minutes returned therapist demonstration      Shoulder Exercises: Therapy Ball   Flexion  Both;10 reps returned therapist demonstration    ABduction  Right;10 reps returned therapist demonstration      Manual Therapy   Soft tissue  mobilization  gently along right mastectomy scar in area of swelling    Scapular Mobilization  in left sidelying to right scapula in direction of retraction and protraction, depression and elevation while gently moving arm into flexion    Manual Lymphatic Drainage (MLD)  in supine: short neck, 5 diaphragmatic breaths, left axillary nodes and establishment of interaxillary pathway, right inguinal nodes and establishement of axillo inguinal pathway, right chest moving fluid towards pathways and right lateral trunk moving fluid towards pathways                  PT Long Term Goals - 04/04/18 1023      PT LONG TERM GOAL #1   Title  Pt will be able to independently verbalize lymphedema risk reduction practices    Baseline  03/21/18- pt able to independently verbalize this    Time  4    Period  Weeks    Status  Achieved      PT LONG TERM GOAL #2   Title  Pt will demonstrate improved right shoulder abduction of at least 160 degrees for increased ease of obtaining position for radiation    Baseline  97, 03/21/18- 151, 04/04/18- 160    Time  4    Period  Weeks    Status  Achieved      PT LONG TERM GOAL #3   Title  Pt will demonstrate improved right shoulder flexion to greater than 150 degrees to allow her to reach items on the top shelf    Baseline  137, 03/21/18- 162    Time  4    Period  Weeks    Status  Achieved      PT LONG TERM GOAL #4   Title  Pt will obtain a prophylactic compression sleeve and gauntlet from DME supplier to wear when she flies    Baseline  03/21/18- pt is working on obtaining this, 04/04/18- pt needs to make an appointment to get measured    Time  4    Period  Weeks    Status  On-going      PT LONG TERM GOAL #5   Title  Pt will be independent in a home exercise program for continued strengthening and stretching    Time  4    Period  Weeks    Status  On-going            Plan - 04/09/18 1103    Clinical Impression Statement  Continued MLD to pt's right  axilla in area of swelling. Also continued scapular mobilization where pt still has some tightness. Next session willl instruct pt in Strength ABC program and progress pt towards independence with this for continued stretching and strengthening after discharge from therapy.  Rehab Potential  Good    Clinical Impairments Affecting Rehab Potential  pt to begin radiation    PT Frequency  2x / week    PT Duration  4 weeks    PT Treatment/Interventions  Patient/family education;Passive range of motion;Manual techniques;Taping;Therapeutic exercise;Therapeutic activities;ADLs/Self Care Home Management;Scar mobilization;Iontophoresis '4mg'$ /ml Dexamethasone    PT Next Visit Plan  instruct in Strength ABC program    PT Home Exercise Plan  post op breast exercises, supine dowel, supine protraction, rockwood    Consulted and Agree with Plan of Care  Patient       Patient will benefit from skilled therapeutic intervention in order to improve the following deficits and impairments:  Decreased range of motion, Impaired UE functional use, Pain, Decreased knowledge of precautions, Postural dysfunction, Decreased strength, Increased fascial restricitons  Visit Diagnosis: Stiffness of right shoulder, not elsewhere classified  Abnormal posture  Acute pain of right shoulder  Localized edema     Problem List Patient Active Problem List   Diagnosis Date Noted  . Cancer of right breast, stage 2 ([place]) 01/22/2018  . Genetic testing 08/18/2017  . BRCA2 positive 08/18/2017  . Family history of breast cancer   . Family history of ovarian cancer   . Family history of prostate cancer   . Malignant neoplasm of upper-outer quadrant of right breast in female, estrogen receptor negative ([place]) 07/25/2017  . Perforated bowel ([place]) 05/13/2015  . Free intraperitoneal air 05/12/2015  . Submucosal lesion of esophagus 10/06/2014  . 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  Diabetes ([place]) 09/24/2014  . Morbid obesity ([place]) 03/20/2014    Allyson Sabal [facility] 04/09/2018, 11:06 AM  [place], [place], [address] Phone: [phone]   Fax:  [phone]  Name: Lasasha Brophy MRN: [id number] Date of Birth: 1966-07-25  Manus Gunning, PT 04/09/18 11:06 AM

## 2018-04-10 ENCOUNTER — Ambulatory Visit
Admission: RE | Admit: 2018-04-10 | Discharge: 2018-04-10 | Disposition: A | Discharge status: Home / Self Care | Payer: 59 | Source: Ambulatory Visit | Attending: Radiation Oncology | Admitting: Radiation Oncology

## 2018-04-10 DIAGNOSIS — Z510 Encounter for antineoplastic radiation therapy: Secondary | ICD-10-CM | POA: Diagnosis not present

## 2018-04-10 DIAGNOSIS — C50411 Malignant neoplasm of upper-outer quadrant of right female breast: Secondary | ICD-10-CM | POA: Diagnosis not present

## 2018-04-11 ENCOUNTER — Ambulatory Visit
Admission: RE | Admit: 2018-04-11 | Discharge: 2018-04-11 | Disposition: A | Discharge status: Home / Self Care | Payer: 59 | Source: Ambulatory Visit | Attending: Radiation Oncology | Admitting: Radiation Oncology

## 2018-04-11 ENCOUNTER — Encounter: Payer: Self-pay | Admitting: Physical Therapy

## 2018-04-11 ENCOUNTER — Ambulatory Visit: Payer: 59 | Admitting: Physical Therapy

## 2018-04-11 DIAGNOSIS — M6281 Muscle weakness (generalized): Secondary | ICD-10-CM

## 2018-04-11 DIAGNOSIS — R293 Abnormal posture: Secondary | ICD-10-CM

## 2018-04-11 DIAGNOSIS — M25611 Stiffness of right shoulder, not elsewhere classified: Secondary | ICD-10-CM | POA: Diagnosis not present

## 2018-04-11 DIAGNOSIS — C50411 Malignant neoplasm of upper-outer quadrant of right female breast: Secondary | ICD-10-CM | POA: Diagnosis not present

## 2018-04-11 DIAGNOSIS — Z510 Encounter for antineoplastic radiation therapy: Secondary | ICD-10-CM | POA: Diagnosis not present

## 2018-04-11 NOTE — Therapy (Signed)
[place], [place], [address] Phone: [phone]   Fax:  [phone]  Physical Therapy Treatment  Patient Details  Name: Kara Crawford MRN: [id number] Date of Birth: 09/10/1966 Referring Provider: Dr. Isidore Moos   Encounter Date: 04/11/2018  PT End of Session - 04/11/18 0851    Visit Number  13    Number of Visits  17    Date for PT Re-Evaluation  04/18/18    PT Start Time  0805    PT Stop Time  2353    PT Time Calculation (min)  42 min    Activity Tolerance  Patient tolerated treatment well    Behavior During Therapy  [facility] for tasks assessed/performed       Past Medical History:  Diagnosis Date  . Anemia    anemia prior to uterine emboliztion procedure.  . Breast cancer ([place])   . Diabetes mellitus without complication ([place])    type 2  . Family history of breast cancer   . Family history of ovarian cancer   . Family history of prostate cancer   . Fibroids   . Headache   . Heart murmur   . Hyperlipidemia   . Hypertension     Past Surgical History:  Procedure Laterality Date  . BREATH TEK H PYLORI N/A 04/17/2014   Procedure: BREATH TEK H PYLORI;  Surgeon: Shann Medal, MD;  [facility];  Service: General;  Laterality: N/A;  . CESAREAN SECTION     x3- normal development  . ESOPHAGOGASTRODUODENOSCOPY N/A 08/20/2015   Procedure: ESOPHAGOGASTRODUODENOSCOPY (EGD);  Surgeon: Alphonsa Overall, MD;  [facility];  Service: General;  Laterality: N/A;  . EXPLORATORY LAPAROTOMY  05/13/2015  . GASTRIC ROUX-EN-Y N/A 10/06/2014   Procedure: LAPAROSCOPIC ROUX-EN-Y GASTRIC BYPASS WITH UPPER ENDOSCOPY;  Surgeon: Pedro Earls, MD;  [facility];  Service: General;  Laterality: N/A;  . LAPAROSCOPY N/A 05/12/2015   Procedure: LAPAROSCOPY REPAIR OF PERFORATED BOWEL;  Surgeon: Alphonsa Overall, MD;  Location: [place];  Service: General;  Laterality: N/A;  . MASTECTOMY WITH RADIOACTIVE SEED GUIDED  EXCISION AND AXILLARY SENTINEL LYMPH NODE BIOPSY Bilateral 01/22/2018   Procedure: BILATERAL MASTECTOMIES WITH RADIOACTIVE SEED TARGETED RIGHT AXILLARY [place] NODE EXCISION AND RIGHT SENTINEL LYMPH NODE BIOPSY;  Surgeon: Alphonsa Overall, MD;  Location: [place];  Service: General;  Laterality: Bilateral;  . PORT-A-CATH REMOVAL Left 01/22/2018   Procedure: REMOVAL PORT-A-CATH;  Surgeon: Alphonsa Overall, MD;  Location: [place];  Service: General;  Laterality: Left;  . PORTACATH PLACEMENT N/A 08/02/2017   Procedure: INSERTION PORT-A-CATH;  Surgeon: Alphonsa Overall, MD;  Location: [place];  Service: General;  Laterality: N/A;  . UTERINE ARTERY EMBOLIZATION  08-16-2010    There were no vitals filed for this visit.  Subjective Assessment - 04/11/18 0806    Subjective  My shoulder is doing pretty good today. I have not been feeling that tightness across my chest.     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 It measures 1 cm and is located in the upper outer quadrant with a Ki67 of 90%. She had 3 abnormal looking axillary nodes on ultrasound and 1 was biopsied and found to be positive, 01/22/18- underwent bilateral mastectomy and a SLNB on the right side with 6/6 were negative, pt has completed chemotherapy 12/14/17, pt is going to start radiation next week    Patient Stated Goals  to get full ROM in the right arm    Currently in Pain?  No/denies    Pain Score  0-No pain                       [facility] Adult PT Treatment/Exercise - 04/11/18 0001      Exercises   Exercises  Other Exercises    Other Exercises   Instructed pt in entire strength ABC program- pt held all stretches x 30 sec bilaterally and performed all exercises x 10 reps with 2 lb weights, educated pt on how to progress strengthening exercises, therapist demonstrated all exercises and pt returned demonstrated, substituted pelvic tilts for ab  curls due to pts weak core muscles                  PT Long Term Goals - 04/04/18 1023      PT LONG TERM GOAL #1   Title  Pt will be able to independently verbalize lymphedema risk reduction practices    Baseline  03/21/18- pt able to independently verbalize this    Time  4    Period  Weeks    Status  Achieved      PT LONG TERM GOAL #2   Title  Pt will demonstrate improved right shoulder abduction of at least 160 degrees for increased ease of obtaining position for radiation    Baseline  97, 03/21/18- 151, 04/04/18- 160    Time  4    Period  Weeks    Status  Achieved      PT LONG TERM GOAL #3   Title  Pt will demonstrate improved right shoulder flexion to greater than 150 degrees to allow her to reach items on the top shelf    Baseline  137, 03/21/18- 162    Time  4    Period  Weeks    Status  Achieved      PT LONG TERM GOAL #4   Title  Pt will obtain a prophylactic compression sleeve and gauntlet from DME supplier to wear when she flies    Baseline  03/21/18- pt is working on obtaining this, 04/04/18- pt needs to make an appointment to get measured    Time  4    Period  Weeks    Status  On-going      PT LONG TERM GOAL #5   Title  Pt will be independent in a home exercise program for continued strengthening and stretching    Time  4    Period  Weeks    Status  On-going            Plan - 04/11/18 6767    Clinical Impression Statement  Instructed pt in entire Strength ABC program today. She held all stretches for 30 seconds and performed all exercises x 10 reps with 2 lbs. Pt did have difficulty with some of the core strengthening exercises and was instructed in pelvic tilts and how to active core muscles to protect back. Will instruct pt in more core strengthening exercises next session. Once pt is independent with these exercises she will be ready for discharge from skilled PT services.     Rehab Potential  Good    Clinical Impairments Affecting Rehab Potential   pt to begin radiation    PT Frequency  2x / week    PT Duration  4 weeks    PT Treatment/Interventions  Patient/family education;Passive range of motion;Manual techniques;Taping;Therapeutic exercise;Therapeutic activities;ADLs/Self  Care Home Management;Scar mobilization;Iontophoresis 92m/ml Dexamethasone    PT Next Visit Plan  assess indep with Strength ABC, add core strengthening exercises    PT Home Exercise Plan  post op breast exercises, supine dowel, supine protraction, rockwood    Consulted and Agree with Plan of Care  Patient       Patient will benefit from skilled therapeutic intervention in order to improve the following deficits and impairments:  Decreased range of motion, Impaired UE functional use, Pain, Decreased knowledge of precautions, Postural dysfunction, Decreased strength, Increased fascial restricitons  Visit Diagnosis: Stiffness of right shoulder, not elsewhere classified  Abnormal posture  Muscle weakness (generalized)     Problem List Patient Active Problem List   Diagnosis Date Noted  . Cancer of right breast, stage 2 ([place] 01/22/2018  . Genetic testing 08/18/2017  . BRCA2 positive 08/18/2017  . Family history of breast cancer   . Family history of ovarian cancer   . Family history of prostate cancer   . Malignant neoplasm of upper-outer quadrant of right breast in female, estrogen receptor negative ([place] 07/25/2017  . Perforated bowel ([place] 05/13/2015  . Free intraperitoneal air 05/12/2015  . Submucosal lesion of esophagus 10/06/2014  . 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 Diabetes ([place] 09/24/2014  . Morbid obesity ([place] 03/20/2014    BAllyson SabalBMuskegon [facility]act[facility] LLC6/09/2018, 9:02 AM  [place] [address]Phone: [phone]  Fax:  [phone] Name: Kara PasquaMRN: [id number] of Birth: 818-Apr-1967 BManus Gunning PT 04/11/18 9:03 AM

## 2018-04-12 ENCOUNTER — Ambulatory Visit
Admission: RE | Admit: 2018-04-12 | Discharge: 2018-04-12 | Disposition: A | Discharge status: Home / Self Care | Payer: 59 | Source: Ambulatory Visit | Attending: Radiation Oncology | Admitting: Radiation Oncology

## 2018-04-12 DIAGNOSIS — Z510 Encounter for antineoplastic radiation therapy: Secondary | ICD-10-CM | POA: Diagnosis not present

## 2018-04-13 ENCOUNTER — Ambulatory Visit
Admission: RE | Admit: 2018-04-13 | Discharge: 2018-04-13 | Disposition: A | Discharge status: Home / Self Care | Payer: 59 | Source: Ambulatory Visit | Attending: Radiation Oncology | Admitting: Radiation Oncology

## 2018-04-13 DIAGNOSIS — Z510 Encounter for antineoplastic radiation therapy: Secondary | ICD-10-CM | POA: Diagnosis not present

## 2018-04-16 ENCOUNTER — Ambulatory Visit
Admission: RE | Admit: 2018-04-16 | Discharge: 2018-04-16 | Disposition: A | Discharge status: Home / Self Care | Payer: 59 | Source: Ambulatory Visit | Attending: Radiation Oncology | Admitting: Radiation Oncology

## 2018-04-16 ENCOUNTER — Encounter: Payer: Self-pay | Admitting: Physical Therapy

## 2018-04-16 ENCOUNTER — Ambulatory Visit: Payer: 59 | Admitting: Physical Therapy

## 2018-04-16 DIAGNOSIS — R293 Abnormal posture: Secondary | ICD-10-CM

## 2018-04-16 DIAGNOSIS — R600 Localized edema: Secondary | ICD-10-CM

## 2018-04-16 DIAGNOSIS — M6281 Muscle weakness (generalized): Secondary | ICD-10-CM

## 2018-04-16 DIAGNOSIS — Z510 Encounter for antineoplastic radiation therapy: Secondary | ICD-10-CM | POA: Diagnosis not present

## 2018-04-16 DIAGNOSIS — M25611 Stiffness of right shoulder, not elsewhere classified: Secondary | ICD-10-CM | POA: Diagnosis not present

## 2018-04-16 NOTE — Patient Instructions (Signed)
Access Code: [email or web address]  URL: [email or web address]  Date: 04/16/2018  Prepared by: Manus Gunning   Exercises  Supine Posterior Pelvic Tilt - 10 reps - 1 sets - 5 sec hold - 1x daily - 7x weekly  Supine March with Posterior Pelvic Tilt - 10 reps - 1 sets - 1x daily - 7x weekly  Supine Pelvic Tilt with Straight Leg Raise - 10 reps - 1 sets - 1x daily - 7x weekly

## 2018-04-16 NOTE — Therapy (Signed)
[place], [place], [address] Phone: [phone]   Fax:  [phone]  Physical Therapy Treatment  Patient Details  Name: Davielle Lingelbach MRN: [id number] Date of Birth: 1966/04/24 Referring Provider: Dr. Isidore Moos   Encounter Date: 04/16/2018  PT End of Session - 04/16/18 1351    Visit Number  14    Number of Visits  17    Date for PT Re-Evaluation  04/18/18    PT Start Time  1324    PT Stop Time  1348    PT Time Calculation (min)  45 min    Activity Tolerance  Patient tolerated treatment well    Behavior During Therapy  [facility] for tasks assessed/performed       Past Medical History:  Diagnosis Date  . Anemia    anemia prior to uterine emboliztion procedure.  . Breast cancer ([place])   . Diabetes mellitus without complication ([place])    type 2  . Family history of breast cancer   . Family history of ovarian cancer   . Family history of prostate cancer   . Fibroids   . Headache   . Heart murmur   . Hyperlipidemia   . Hypertension     Past Surgical History:  Procedure Laterality Date  . BREATH TEK H PYLORI N/A 04/17/2014   Procedure: BREATH TEK H PYLORI;  Surgeon: Shann Medal, MD;  Location: [facility];  Service: General;  Laterality: N/A;  . CESAREAN SECTION     x3- normal development  . ESOPHAGOGASTRODUODENOSCOPY N/A 08/20/2015   Procedure: ESOPHAGOGASTRODUODENOSCOPY (EGD);  Surgeon: Alphonsa Overall, MD;  Location: [facility];  Service: General;  Laterality: N/A;  . EXPLORATORY LAPAROTOMY  05/13/2015  . GASTRIC ROUX-EN-Y N/A 10/06/2014   Procedure: LAPAROSCOPIC ROUX-EN-Y GASTRIC BYPASS WITH UPPER ENDOSCOPY;  Surgeon: Pedro Earls, MD;  [facility];  Service: General;  Laterality: N/A;  . LAPAROSCOPY N/A 05/12/2015   Procedure: LAPAROSCOPY REPAIR OF PERFORATED BOWEL;  Surgeon: Alphonsa Overall, MD;  Location: [facility];  Service: General;  Laterality: N/A;  . MASTECTOMY WITH RADIOACTIVE SEED GUIDED  EXCISION AND AXILLARY SENTINEL LYMPH NODE BIOPSY Bilateral 01/22/2018   Procedure: BILATERAL MASTECTOMIES WITH RADIOACTIVE SEED TARGETED RIGHT AXILLARY [place] NODE EXCISION AND RIGHT SENTINEL LYMPH NODE BIOPSY;  Surgeon: Alphonsa Overall, MD;  Location: [place];  Service: General;  Laterality: Bilateral;  . PORT-A-CATH REMOVAL Left 01/22/2018   Procedure: REMOVAL PORT-A-CATH;  Surgeon: Alphonsa Overall, MD;  Location: [facility] [place];  Service: General;  Laterality: Left;  . PORTACATH PLACEMENT N/A 08/02/2017   Procedure: INSERTION PORT-A-CATH;  Surgeon: Alphonsa Overall, MD;  Location: [facility];  Service: General;  Laterality: N/A;  . UTERINE ARTERY EMBOLIZATION  08-16-2010    There were no vitals filed for this visit.  Subjective Assessment - 04/16/18 1306    Subjective  Exercises are going well. I have not done any of them today. All weekend I did my exercises.     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 It measures 1 cm and is located in the upper outer quadrant with a Ki67 of 90%. She had 3 abnormal looking axillary nodes on ultrasound and 1 was biopsied and found to be positive, 01/22/18- underwent bilateral mastectomy and a SLNB on the right side with 6/6 were negative, pt has completed chemotherapy 12/14/17, pt is going to start radiation next week    Patient Stated Goals  to get full ROM in the right arm    Currently in Pain?  No/denies    Pain Score  0-No pain                       [facility] Adult PT Treatment/Exercise - 04/16/18 0001      Exercises   Other Exercises   Instructed pt in core strengthening exercises as follows providing demonstration, verbal and visual cues for pt to perform correctly: pelvic tilts with 5 sec holds x 10 reps, pelvic titls with marching x 10 reps bilaterally with cues to keep core engaged, pelvic tilts with knee extension x 10 reps bilaterally, pelvic tilts with SLR  x 10 reps bilaterally - pt reports this was challenging      Knee/Hip Exercises: Supine   Bridges  Strengthening;Both;10 reps with verbal cues to keep core engaged      Manual Therapy   Manual Lymphatic Drainage (MLD)  in supine: short neck, 5 diaphragmatic breaths, left axillary nodes and establishment of interaxillary pathway, right inguinal nodes and establishement of axillo inguinal pathway, right chest moving fluid towards pathways and right lateral trunk moving fluid towards pathways                  PT Long Term Goals - 04/04/18 1023      PT LONG TERM GOAL #1   Title  Pt will be able to independently verbalize lymphedema risk reduction practices    Baseline  03/21/18- pt able to independently verbalize this    Time  4    Period  Weeks    Status  Achieved      PT LONG TERM GOAL #2   Title  Pt will demonstrate improved right shoulder abduction of at least 160 degrees for increased ease of obtaining position for radiation    Baseline  97, 03/21/18- 151, 04/04/18- 160    Time  4    Period  Weeks    Status  Achieved      PT LONG TERM GOAL #3   Title  Pt will demonstrate improved right shoulder flexion to greater than 150 degrees to allow her to reach items on the top shelf    Baseline  137, 03/21/18- 162    Time  4    Period  Weeks    Status  Achieved      PT LONG TERM GOAL #4   Title  Pt will obtain a prophylactic compression sleeve and gauntlet from DME supplier to wear when she flies    Baseline  03/21/18- pt is working on obtaining this, 04/04/18- pt needs to make an appointment to get measured    Time  4    Period  Weeks    Status  On-going      PT LONG TERM GOAL #5   Title  Pt will be independent in a home exercise program for continued strengthening and stretching    Time  4    Period  Weeks    Status  On-going            Plan - 04/16/18 1352    Clinical Impression Statement  Pt had difficulty with core exercise in Strength ABC program last week.  Today spent time teaching pt proper way to activate transverse abdominals and doing pelvic tilts. Once pt was independent with this added LE movements while keeping core engaged. Pt demonstrated independence by end of session. Issued these to pt as part  of HEP. Pt is trying to obtain a compression sleeve and glove from DME provider that is in network with her insurance. She is approaching DC from skilled PT services.     Rehab Potential  Good    Clinical Impairments Affecting Rehab Potential  pt to begin radiation    PT Frequency  2x / week    PT Treatment/Interventions  Patient/family education;Passive range of motion;Manual techniques;Taping;Therapeutic exercise;Therapeutic activities;ADLs/Self Care Home Management;Scar mobilization;Iontophoresis '4mg'$ /ml Dexamethasone    PT Next Visit Plan  assess indep with Strength ABC, add core strengthening exercises, ask about sleeve and gauntlet    PT Home Exercise Plan  post op breast exercises, supine dowel, supine protraction, rockwood, core exercises, strength ABC     Consulted and Agree with Plan of Care  Patient       Patient will benefit from skilled therapeutic intervention in order to improve the following deficits and impairments:  Decreased range of motion, Impaired UE functional use, Pain, Decreased knowledge of precautions, Postural dysfunction, Decreased strength, Increased fascial restricitons  Visit Diagnosis: Abnormal posture  Muscle weakness (generalized)  Localized edema     Problem List Patient Active Problem List   Diagnosis Date Noted  . Cancer of right breast, stage 2 ([place]) 01/22/2018  . Genetic testing 08/18/2017  . BRCA2 positive 08/18/2017  . Family history of breast cancer   . Family history of ovarian cancer   . Family history of prostate cancer   . Malignant neoplasm of upper-outer quadrant of right breast in female, estrogen receptor negative ([place]) 07/25/2017  . Perforated bowel ([place]) 05/13/2015  . Free  intraperitoneal air 05/12/2015  . Submucosal lesion of esophagus 10/06/2014  . 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 Diabetes ([place]) 09/24/2014  . Morbid obesity ([place]) 03/20/2014    Allyson Sabal [facility] 04/16/2018, 1:54 PM  [place], [place], [address] Phone: [phone]   Fax:  [phone]  Name: Lee-Ann Gal MRN: [id number] Date of Birth: 02/04/1966  Manus Gunning, PT 04/16/18 1:54 PM

## 2018-04-17 ENCOUNTER — Ambulatory Visit
Admission: RE | Admit: 2018-04-17 | Discharge: 2018-04-17 | Disposition: A | Discharge status: Home / Self Care | Payer: 59 | Source: Ambulatory Visit | Attending: Radiation Oncology | Admitting: Radiation Oncology

## 2018-04-17 DIAGNOSIS — Z510 Encounter for antineoplastic radiation therapy: Secondary | ICD-10-CM | POA: Diagnosis not present

## 2018-04-18 ENCOUNTER — Telehealth: Payer: Self-pay

## 2018-04-18 ENCOUNTER — Ambulatory Visit: Payer: 59 | Admitting: Physical Therapy

## 2018-04-18 ENCOUNTER — Ambulatory Visit
Admission: RE | Admit: 2018-04-18 | Discharge: 2018-04-18 | Disposition: A | Discharge status: Home / Self Care | Payer: 59 | Source: Ambulatory Visit | Attending: Radiation Oncology | Admitting: Radiation Oncology

## 2018-04-18 ENCOUNTER — Encounter: Payer: Self-pay | Admitting: Physical Therapy

## 2018-04-18 DIAGNOSIS — M25611 Stiffness of right shoulder, not elsewhere classified: Secondary | ICD-10-CM | POA: Diagnosis not present

## 2018-04-18 DIAGNOSIS — M6281 Muscle weakness (generalized): Secondary | ICD-10-CM

## 2018-04-18 DIAGNOSIS — C50411 Malignant neoplasm of upper-outer quadrant of right female breast: Secondary | ICD-10-CM | POA: Diagnosis not present

## 2018-04-18 DIAGNOSIS — Z510 Encounter for antineoplastic radiation therapy: Secondary | ICD-10-CM | POA: Diagnosis not present

## 2018-04-18 DIAGNOSIS — R600 Localized edema: Secondary | ICD-10-CM

## 2018-04-18 DIAGNOSIS — R293 Abnormal posture: Secondary | ICD-10-CM

## 2018-04-18 NOTE — Telephone Encounter (Signed)
Spoke with patient reminding of SCP visit with NP on 05/01/18 at 9 am.  Patient confirmed she will come to appt.

## 2018-04-18 NOTE — Therapy (Signed)
[place], [place], [address] Phone: [phone]   Fax:  [phone]  Physical Therapy Treatment  Patient Details  Name: Theta Leaf MRN: [id number] Date of Birth: 1965-12-30 Referring Provider: Dr. Isidore Moos   Encounter Date: 04/18/2018  PT End of Session - 04/18/18 1042    Visit Number  15    Number of Visits  17    Date for PT Re-Evaluation  04/18/18    PT Start Time  0807    PT Stop Time  0848    PT Time Calculation (min)  41 min    Activity Tolerance  Patient tolerated treatment well    Behavior During Therapy  [facility] for tasks assessed/performed       Past Medical History:  Diagnosis Date  . Anemia    anemia prior to uterine emboliztion procedure.  . Breast cancer ([place])   . Diabetes mellitus without complication ([place])    type 2  . Family history of breast cancer   . Family history of ovarian cancer   . Family history of prostate cancer   . Fibroids   . Headache   . Heart murmur   . Hyperlipidemia   . Hypertension     Past Surgical History:  Procedure Laterality Date  . BREATH TEK H PYLORI N/A 04/17/2014   Procedure: BREATH TEK H PYLORI;  Surgeon: Shann Medal, MD;  Location: [facility];  Service: General;  Laterality: N/A;  . CESAREAN SECTION     x3- normal development  . ESOPHAGOGASTRODUODENOSCOPY N/A 08/20/2015   Procedure: ESOPHAGOGASTRODUODENOSCOPY (EGD);  Surgeon: Alphonsa Overall, MD;  Location: [facility];  Service: General;  Laterality: N/A;  . EXPLORATORY LAPAROTOMY  05/13/2015  . GASTRIC ROUX-EN-Y N/A 10/06/2014   Procedure: LAPAROSCOPIC ROUX-EN-Y GASTRIC BYPASS WITH UPPER ENDOSCOPY;  Surgeon: Pedro Earls, MD;  [facility];  Service: General;  Laterality: N/A;  . LAPAROSCOPY N/A 05/12/2015   Procedure: LAPAROSCOPY REPAIR OF PERFORATED BOWEL;  Surgeon: Alphonsa Overall, MD;  Location: [facility];  Service: General;  Laterality: N/A;  . MASTECTOMY WITH RADIOACTIVE SEED GUIDED  EXCISION AND AXILLARY SENTINEL LYMPH NODE BIOPSY Bilateral 01/22/2018   Procedure: BILATERAL MASTECTOMIES WITH RADIOACTIVE SEED TARGETED RIGHT AXILLARY [place] NODE EXCISION AND RIGHT SENTINEL LYMPH NODE BIOPSY;  Surgeon: Alphonsa Overall, MD;  Location: [facility] [place];  Service: General;  Laterality: Bilateral;  . PORT-A-CATH REMOVAL Left 01/22/2018   Procedure: REMOVAL PORT-A-CATH;  Surgeon: Alphonsa Overall, MD;  Location: [place];  Service: General;  Laterality: Left;  . PORTACATH PLACEMENT N/A 08/02/2017   Procedure: INSERTION PORT-A-CATH;  Surgeon: Alphonsa Overall, MD;  Location: [place];  Service: General;  Laterality: N/A;  . UTERINE ARTERY EMBOLIZATION  08-16-2010    There were no vitals filed for this visit.  Subjective Assessment - 04/18/18 0811    Subjective  I called all the places that my insurance told me about and none of them accepted my insurance so I am probably just going to pay out of pocket.     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 It measures 1 cm and is located in the upper outer quadrant with a Ki67 of 90%. She had 3 abnormal looking axillary nodes on ultrasound and 1 was biopsied and found to be positive, 01/22/18- underwent bilateral mastectomy and a SLNB on the right side with 6/6 were negative, pt has completed chemotherapy 12/14/17, pt is going  to start radiation next week    Patient Stated Goals  to get full ROM in the right arm    Currently in Pain?  No/denies    Pain Score  0-No pain                       [facility] Adult PT Treatment/Exercise - 04/18/18 0001      Exercises   Other Exercises   Instructed pt in core strengthening exercises as follows providing demonstration, verbal and visual cues for pt to perform correctly: pelvic tilts with 5 sec holds x 10 reps, pelvic titls with marching x 10 reps bilaterally with cues to keep core engaged, pelvic tilts with knee  extension x 10 reps bilaterally, pelvic tilts with SLR x 10 reps bilaterally - pt did not require verbal cues today for core activation      Knee/Hip Exercises: Supine   Bridges  Strengthening;Both;10 reps;2 sets with verbal cues to keep core engaged      Manual Therapy   Manual Lymphatic Drainage (MLD)  in supine: short neck, 5 diaphragmatic breaths, left axillary nodes and establishment of interaxillary pathway, right inguinal nodes and establishement of axillo inguinal pathway, right chest moving fluid towards pathways and right lateral trunk moving fluid towards pathways                  PT Long Term Goals - 04/18/18 1044      PT LONG TERM GOAL #1   Title  Pt will be able to independently verbalize lymphedema risk reduction practices    Baseline  03/21/18- pt able to independently verbalize this    Time  4    Period  Weeks    Status  Achieved      PT LONG TERM GOAL #2   Title  Pt will demonstrate improved right shoulder abduction of at least 160 degrees for increased ease of obtaining position for radiation    Baseline  97, 03/21/18- 151, 04/04/18- 160    Time  4    Period  Weeks    Status  Achieved      PT LONG TERM GOAL #3   Title  Pt will demonstrate improved right shoulder flexion to greater than 150 degrees to allow her to reach items on the top shelf    Baseline  137, 03/21/18- 162    Time  4    Period  Weeks    Status  Achieved      PT LONG TERM GOAL #4   Title  Pt will obtain a prophylactic compression sleeve and gauntlet from DME supplier to wear when she flies    Baseline  03/21/18- pt is working on obtaining this, 04/04/18- pt needs to make an appointment to get measured, 04/18/18- pt reports she will obtain this prior to her trip    Time  4    Period  Weeks    Status  Achieved      PT LONG TERM GOAL #5   Title  Pt will be independent in a home exercise program for continued strengthening and stretching    Time  4    Period  Weeks    Status  Achieved             Plan - 04/18/18 1043    Clinical Impression Statement  Reivewed core exercises issued at last session. Pt demonstrates independence with these and reports independence with Strength ABC program. At this point pt is  ready for discharge from skilled PT services. She has met all goals for therapy and will be obtaining a compression sleeve prior to her trip. She does have a small place that has opened in her right axilla from radiation but today is her last session. Educated pt not to stretch this area too much until her skin is healed.     Rehab Potential  Good    Clinical Impairments Affecting Rehab Potential  pt to begin radiation    PT Frequency  2x / week    PT Duration  4 weeks    PT Treatment/Interventions  Patient/family education;Passive range of motion;Manual techniques;Taping;Therapeutic exercise;Therapeutic activities;ADLs/Self Care Home Management;Scar mobilization;Iontophoresis 58m/ml Dexamethasone    PT Next Visit Plan  dc this visit    PT Home Exercise Plan  post op breast exercises, supine dowel, supine protraction, rockwood, core exercises, strength ABC     Consulted and Agree with Plan of Care  Patient       Patient will benefit from skilled therapeutic intervention in order to improve the following deficits and impairments:  Decreased range of motion, Impaired UE functional use, Pain, Decreased knowledge of precautions, Postural dysfunction, Decreased strength, Increased fascial restricitons  Visit Diagnosis: Abnormal posture  Muscle weakness (generalized)  Localized edema     Problem List Patient Active Problem List   Diagnosis Date Noted  . Cancer of right breast, stage 2 ([place] 01/22/2018  . Genetic testing 08/18/2017  . BRCA2 positive 08/18/2017  . Family history of breast cancer   . Family history of ovarian cancer   . Family history of prostate cancer   . Malignant neoplasm of upper-outer quadrant of right breast in female, estrogen receptor  negative ([place] 07/25/2017  . Perforated bowel ([place] 05/13/2015  . Free intraperitoneal air 05/12/2015  . Submucosal lesion of esophagus 10/06/2014  . 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 Diabetes ([place] 09/24/2014  . Morbid obesity ([place] 03/20/2014    BAllyson SabalBHenry Ford Macomb Hospital6/19/2019, 10:45 AM  [place] [address]Phone: [phone]  Fax:  [phone] Name: PRetaj HilbunMRN: [id number] of Birth: 8Oct 05, 1967 PHYSICAL THERAPY DISCHARGE SUMMARY  Visits from Start of Care: 15  Current functional level related to goals / functional outcomes: See above- all goals met   Remaining deficits: none   Education / Equipment: HEP, compression garments  Plan: Patient agrees to discharge.  Patient goals were met. Patient is being discharged due to meeting the stated rehab goals.  ?????    BAllyson SabalBSt. Stephens PVirginia06/19/19 10:46 AM

## 2018-04-23 ENCOUNTER — Ambulatory Visit: Payer: 59 | Admitting: Physical Therapy

## 2018-04-24 ENCOUNTER — Encounter: Payer: Self-pay | Admitting: Radiation Oncology

## 2018-04-24 NOTE — Progress Notes (Signed)
  Radiation Oncology         [phone] ________________________________  Name: Ajee Heasley MRN: [id number]  Date: 04/24/2018  DOB: 1966-08-23  End of Treatment Note  Diagnosis:   StageIIBRightBreast UOQ Invasive Ductal Carcinoma, ER(-)/ PR(-)/ Her2(-), Grade3, [id number]     Indication for treatment:  Curative       Radiation treatment dates:   03/07/2018 - 04/18/2018  Site/dose:    1) Right Chest Wall / 50 Gy in 25 fractions 2) Right Supraclavicular fossa and axilla / 50 Gy in 25 fractions 3) Right Chest Wall Scar boost / 10 Gy in 5 fractions  Beams/energy:    1) 3D  / 10 and 6 MV photons 2) 3D / 10 and 6 MV photons 3) En face electrons / 6 MeV electrons  Narrative: The patient tolerated radiation treatment relatively well.   Throughout treatment she only noticed hyperpigmentation to her skin. She used radiaplax as prescribed to help with relief.   Plan: The patient has completed radiation treatment. The patient will return to radiation oncology clinic for routine followup in one month. I advised them to call or return sooner if they have any questions or concerns related to their recovery or treatment.  -----------------------------------  Eppie Gibson, MD  This document serves as a record of services personally performed by Eppie Gibson, MD. It was created on her behalf by Margit Banda, a trained medical scribe. The creation of this record is based on the scribe's personal observations and the provider's statements to them. This document has been checked and approved by the attending provider.

## 2018-04-25 ENCOUNTER — Ambulatory Visit: Payer: 59 | Admitting: Physical Therapy

## 2018-04-30 ENCOUNTER — Encounter (HOSPITAL_COMMUNITY): Payer: 59 | Admitting: Internal Medicine

## 2018-04-30 ENCOUNTER — Other Ambulatory Visit (HOSPITAL_COMMUNITY): Payer: 59

## 2018-05-01 ENCOUNTER — Encounter: Payer: Self-pay | Admitting: Adult Health

## 2018-05-01 ENCOUNTER — Inpatient Hospital Stay: Payer: 59 | Attending: Hematology and Oncology | Admitting: Adult Health

## 2018-05-01 ENCOUNTER — Telehealth: Payer: Self-pay | Admitting: Adult Health

## 2018-05-01 VITALS — BP 140/84 | HR 80 | Temp 98.3°F | Resp 18 | Ht 60.0 in | Wt 181.1 lb

## 2018-05-01 DIAGNOSIS — Z1501 Genetic susceptibility to malignant neoplasm of breast: Secondary | ICD-10-CM | POA: Diagnosis not present

## 2018-05-01 DIAGNOSIS — E119 Type 2 diabetes mellitus without complications: Secondary | ICD-10-CM

## 2018-05-01 DIAGNOSIS — Z9013 Acquired absence of bilateral breasts and nipples: Secondary | ICD-10-CM | POA: Insufficient documentation

## 2018-05-01 DIAGNOSIS — C773 Secondary and unspecified malignant neoplasm of axilla and upper limb lymph nodes: Secondary | ICD-10-CM | POA: Diagnosis not present

## 2018-05-01 DIAGNOSIS — Z171 Estrogen receptor negative status [ER-]: Secondary | ICD-10-CM

## 2018-05-01 DIAGNOSIS — I10 Essential (primary) hypertension: Secondary | ICD-10-CM | POA: Insufficient documentation

## 2018-05-01 DIAGNOSIS — Z9221 Personal history of antineoplastic chemotherapy: Secondary | ICD-10-CM | POA: Insufficient documentation

## 2018-05-01 DIAGNOSIS — C50411 Malignant neoplasm of upper-outer quadrant of right female breast: Secondary | ICD-10-CM | POA: Diagnosis not present

## 2018-05-01 NOTE — Progress Notes (Signed)
CLINIC:  Survivorship   REASON FOR VISIT:  Routine follow-up post-treatment for a recent history of breast cancer.  BRIEF ONCOLOGIC HISTORY:    Malignant neoplasm of upper-outer quadrant of right breast in female, estrogen receptor negative ([place])   07/17/2017 Initial Diagnosis    Right upper outer quadrant painful palpable right breast mass by ultrasound measured 1 cm with 3 abnormal axillary lymph nodes 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, Ki-67 90%, lymph node biopsy also positive, T1bN1 stage IIB AJCC 8      08/03/2017 - 12/14/2017 Neo-Adjuvant Chemotherapy    Dose dense Adriamycin and Cytoxan followed by Taxol weekly 12      08/18/2017 Genetic Testing    Patient had genetic testing due to a personal history of breast cancer and a family history of prostate, breast, and ovarian cancer.  The Common Hereditary Cancers Panel was ordered.  The Hereditary Gene Panel offered by [facility] includes sequencing and/or deletion duplication testing of the following 46 genes: APC, ATM, AXIN2, BARD1, BMPR1A, BRCA1, BRCA2, BRIP1, CDH1, CDKN2A (p14ARF), CDKN2A (p16INK4a), CHEK2, CTNNA1, DICER1, EPCAM (Deletion/duplication testing only), GREM1 (promoter region deletion/duplication testing only), KIT, MEN1, MLH1, MSH2, MSH3, MSH6, MUTYH, NBN, NF1, NHTL1, PALB2, PDGFRA, PMS2, POLD1, POLE, PTEN, RAD50, RAD51C, RAD51D, SDHB, SDHC, SDHD, SMAD4, SMARCA4. STK11, TP53, TSC1, TSC2, and VHL.  The following genes were evaluated for sequence changes only: SDHA and HOXB13 c.251G>A variant only.       Results: POSITIVE for a mutation in BRCA2 c.[id number] (p.Asp1540Glyfs*6).  The date of this test report is 08/18/2017.      12/15/2017 Breast MRI    Radiologic complete response      01/22/2018 Surgery    Bilateral mastectomies: Right mastectomy: No residual cancer, 0/5 lymph nodes negative; left mastectomy: UDH, FA, no malignancy, ER 0%, PR 0%, HER-2 negative, Ki-67 90%,  complete pathologic response      03/07/2018 - 04/18/2018 Radiation Therapy    1) Right Chest Wall / 50 Gy in 25 fractions 2) Right Supraclavicular fossa/ 50 Gy in 25 fractions 3) Right Chest Wall Scar boost / 10 Gy in 5 fractions          INTERVAL HISTORY:  Kara Crawford presents to the [facility] today for our initial meeting to review her survivorship care plan detailing her treatment course for breast cancer, as well as monitoring long-term side effects of that treatment, education regarding health maintenance, screening, and overall wellness and health promotion.     Overall, Kara Crawford reports feeling quite well.  She does still have some intermittent fatigue and cognitive dysfunction from chemotherapy, however is recovering well.  She is now working on diet and exercise.    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  She also is planning on talking with a plastic surgeon about DIEP flap reconstruction.     REVIEW OF SYSTEMS:  Review of Systems  Constitutional: Negative for appetite change, chills, fatigue, fever and unexpected weight change.  HENT:   Negative for hearing loss, lump/mass and trouble swallowing.   Eyes: Negative for eye problems and icterus.  Respiratory: Negative for chest tightness, cough and shortness of breath.   Cardiovascular: Negative for chest pain, leg swelling and palpitations.  Gastrointestinal: Negative for abdominal distention, abdominal pain, constipation, diarrhea, nausea and vomiting.  Endocrine: Negative for hot flashes.  Skin: Negative for itching and rash.  Neurological: Negative for dizziness, extremity weakness, headaches and numbness.  Hematological: Negative for adenopathy.  Does not bruise/bleed easily.  Psychiatric/Behavioral: Negative for depression. The patient is not nervous/anxious.       ONCOLOGY TREATMENT TEAM:  1. Surgeon:  Dr. Lucia Gaskins at [facility] 2. Medical Oncologist: Dr. Lindi Adie  3.  Radiation Oncologist: Dr. Isidore Moos    PAST MEDICAL/SURGICAL HISTORY:  Past Medical History:  Diagnosis Date  . Anemia    anemia prior to uterine emboliztion procedure.  . Breast cancer ([place])   . Diabetes mellitus without complication ([place])    type 2  . Family history of breast cancer   . Family history of ovarian cancer   . Family history of prostate cancer   . Fibroids   . Headache   . Heart murmur   . Hyperlipidemia   . Hypertension    Past Surgical History:  Procedure Laterality Date  . BREATH TEK H PYLORI N/A 04/17/2014   Procedure: BREATH TEK H PYLORI;  Surgeon: Shann Medal, MD;  Location: [facility];  Service: General;  Laterality: N/A;  . CESAREAN SECTION     x3- normal development  . ESOPHAGOGASTRODUODENOSCOPY N/A 08/20/2015   Procedure: ESOPHAGOGASTRODUODENOSCOPY (EGD);  Surgeon: Alphonsa Overall, MD;  Location: [facility];  Service: General;  Laterality: N/A;  . EXPLORATORY LAPAROTOMY  05/13/2015  . GASTRIC ROUX-EN-Y N/A 10/06/2014   Procedure: LAPAROSCOPIC ROUX-EN-Y GASTRIC BYPASS WITH UPPER ENDOSCOPY;  Surgeon: Pedro Earls, MD;  Location: [facility];  Service: General;  Laterality: N/A;  . LAPAROSCOPY N/A 05/12/2015   Procedure: LAPAROSCOPY REPAIR OF PERFORATED BOWEL;  Surgeon: Alphonsa Overall, MD;  Location: [place];  Service: General;  Laterality: N/A;  . MASTECTOMY WITH RADIOACTIVE SEED GUIDED EXCISION AND AXILLARY SENTINEL LYMPH NODE BIOPSY Bilateral 01/22/2018   Procedure: BILATERAL MASTECTOMIES WITH RADIOACTIVE SEED TARGETED RIGHT AXILLARY [place] NODE EXCISION AND RIGHT SENTINEL LYMPH NODE BIOPSY;  Surgeon: Alphonsa Overall, MD;  Location: [place];  Service: General;  Laterality: Bilateral;  . PORT-A-CATH REMOVAL Left 01/22/2018   Procedure: REMOVAL PORT-A-CATH;  Surgeon: Alphonsa Overall, MD;  Location: [place];  Service: General;  Laterality: Left;  . PORTACATH PLACEMENT N/A 08/02/2017   Procedure: INSERTION PORT-A-CATH;  Surgeon: Alphonsa Overall, MD;  Location: [place];  Service: General;  Laterality: N/A;  . UTERINE ARTERY EMBOLIZATION  08-16-2010     ALLERGIES:  No Known Allergies   CURRENT MEDICATIONS:  Outpatient Encounter Medications as of 05/01/2018  Medication Sig Note  . acetaminophen (TYLENOL) 500 MG tablet Take 1,000 mg by mouth daily as needed for moderate pain or headache.   Marland Kitchen amLODipine (NORVASC) 10 MG tablet Take 10 mg by mouth every morning.    Marland Kitchen atorvastatin (LIPITOR) 80 MG tablet Take 80 mg by mouth every morning.    . Calcium Carbonate-Vitamin D (CALCIUM 500/D PO) Take 2 tablets by mouth daily.   . Cholecalciferol (VITAMIN D3) 1000 units CAPS Take 5,000 Units by mouth daily. 01/16/2018: Takes 5000 units once per day and 50000 units once per week  . diphenhydramine-acetaminophen (TYLENOL PM) 25-500 MG TABS tablet Take 1-2 tablets by mouth at bedtime as needed (sleep).   Marland Kitchen lisinopril-hydrochlorothiazide (PRINZIDE,ZESTORETIC) 20-12.5 MG per tablet Take 2 tablets by mouth daily.    . metoprolol succinate (TOPROL-XL) 25 MG 24 hr tablet Take 25 mg by mouth every morning.    . pantoprazole (PROTONIX) 40 MG tablet Take 40 mg by mouth daily.   Vladimir Faster Glycol-Propyl Glycol (SYSTANE OP) Place 1 drop into both eyes daily as needed (dry eyes).   . sitaGLIPtin-metformin (JANUMET) 50-500  MG per tablet Take 1 tablet by mouth 2 (two) times daily with a meal.    . vitamin B-12 (CYANOCOBALAMIN) 1000 MCG tablet Take 2,000 mcg by mouth daily.   . Vitamin D, Ergocalciferol, (DRISDOL) 50000 units CAPS capsule Take 50,000 Units by mouth every Tuesday. 01/16/2018: Takes 5000 units once per day and 50000 units once per week  . [DISCONTINUED] HYDROcodone-acetaminophen (NORCO/VICODIN) 5-325 MG tablet Take 1 tablet by mouth every 6 (six) hours as needed for moderate pain. (Patient not taking: Reported on 05/01/2018)    Facility-Administered Encounter Medications as of 05/01/2018  Medication  . sodium chloride flush (NS) 0.9 % injection 10 mL  . sodium  chloride flush (NS) 0.9 % injection 10 mL  . sodium chloride flush (NS) 0.9 % injection 10 mL     ONCOLOGIC FAMILY HISTORY:  Family History  Problem Relation Age of Onset  . Diabetes Mother   . Hypertension Mother   . Stroke Mother   . Diabetes Father   . Hypertension Father   . Prostate cancer Father 52       found at stage II  . Prostate cancer Paternal Uncle 41  . Breast cancer Paternal Aunt 40  . Cancer Maternal Aunt 55       type unknown  . Ovarian cancer Paternal Grandmother 13  . Cancer Other        type unk- dx in 40's  . Breast cancer Cousin 49     GENETIC COUNSELING/TESTING: BRCA 2 positive  SOCIAL HISTORY:  Social History   Socioeconomic History  . Marital status: Married    Spouse name: Not on file  . Number of children: Not on file  . Years of education: Not on file  . Highest education level: Not on file  Occupational History  . Not on file  Social Needs  . Financial resource strain: Not on file  . Food insecurity:    Worry: Not on file    Inability: Not on file  . Transportation needs:    Medical: Not on file    Non-medical: Not on file  Tobacco Use  . Smoking status: Never Smoker  . Smokeless tobacco: Never Used  Substance and Sexual Activity  . Alcohol use: Yes    Alcohol/week: 0.6 oz    Types: 1 Glasses of wine per week    Comment: social -rare occ.  . Drug use: No  . Sexual activity: Yes  Lifestyle  . Physical activity:    Days per week: Not on file    Minutes per session: Not on file  . Stress: Not on file  Relationships  . Social connections:    Talks on phone: Not on file    Gets together: Not on file    Attends religious service: Not on file    Active member of club or organization: Not on file    Attends meetings of clubs or organizations: Not on file    Relationship status: Not on file  . Intimate partner violence:    Fear of current or ex partner: Not on file    Emotionally abused: Not on file    Physically abused:  Not on file    Forced sexual activity: Not on file  Other Topics Concern  . Not on file  Social History Narrative  . Not on file     PHYSICAL EXAMINATION:  Vital Signs:   Vitals:   05/01/18 0928  BP: 140/84  Pulse: 80  Resp: 18  Temp:  98.3 F (36.8 C)  SpO2: 100%   Filed Weights   05/01/18 0928  Weight: 181 lb 1.6 oz (82.1 kg)   General: Well-nourished, well-appearing female in no acute distress.  She is unaccompanied today.   HEENT: Head is normocephalic.  Pupils equal and reactive to light. Conjunctivae clear without exudate.  Sclerae anicteric. Oral mucosa is pink, moist.  Oropharynx is pink without lesions or erythema.  Lymph: No cervical, supraclavicular, or infraclavicular lymphadenopathy noted on palpation.  Cardiovascular: Regular rate and rhythm.Marland Kitchen Respiratory: Clear to auscultation bilaterally. Chest expansion symmetric; breathing non-labored.  Breasts: right breast s/p mastectomy and radiation, the area continues to have dry desquamation from radiation, and the patient has a gauze bandage in place to prevent rubbing against her bra, left breast s/p mastectomy.  No nodules/masses noted. GI: Abdomen soft and round; non-tender, non-distended. Bowel sounds normoactive.  GU: Deferred.  Neuro: No focal deficits. Steady gait.  Psych: Mood and affect normal and appropriate for situation.  Extremities: No edema. MSK: No focal spinal tenderness to palpation.  Full range of motion in bilateral upper extremities Skin: Warm and dry.  LABORATORY DATA:  None for this visit.  DIAGNOSTIC IMAGING:  None for this visit.      ASSESSMENT AND PLAN:  Kara Crawford is a pleasant 52 y.o. female with Stage IIB right breast invasive ductal carcinoma, ER-/PR-/HER2-, diagnosed in 07/2017, treated with neoadjuvant chemotherapy, bilateral mastectomies and adjuvant radiation.  She presents to the Survivorship Clinic for our initial meeting and routine follow-up post-completion of treatment for  breast cancer.    1. Stage IIB right breast cancer:  Kara Crawford is continuing to recover from definitive treatment for breast cancer. She will follow-up with her medical oncologist, Dr. Lindi Adie in 6 months with history and physical exam per surveillance protocol.  Today, a comprehensive survivorship care plan and treatment summary was reviewed with the patient today detailing her breast cancer diagnosis, treatment course, potential late/long-term effects of treatment, appropriate follow-up care with recommendations for the future, and patient education resources.  A copy of this summary, along with a letter will be sent to the patient's primary care provider via mail/fax/In Basket message after today's visit.    2. BRCA 2 positivity: I reviewed that she should consider risk reducing surgery with BSO.  She is planning on undergoing this in September.  She was also recommended annual skin exams with dermatology.  I reviewed that her risk is slightly increased for pancreatic cancer, but that after review with Dr. Lindi Adie, her family history, and NCCN guidelines there is no role for additional screening.  3. Bone health:  Given Kara Crawford's age/history of breast cancer, she is at risk for bone demineralization.  She was given education on specific activities to promote bone health.  I will defer to her PCP regarding future bone density testing and management.    4. Cancer screening:  Due to Kara Crawford's history and her age, she should receive screening for skin cancers, colon cancer, and gynecologic cancers.  The information and recommendations are listed on the patient's comprehensive care plan/treatment summary and were reviewed in detail with the patient.    5. Health maintenance and wellness promotion: Kara Crawford was encouraged to consume 5-7 servings of fruits and vegetables per day. We reviewed the "Nutrition Rainbow" handout, as well as the handout "Take Control of Your Health and Reduce Your Cancer  Risk" from the [place].  She was also encouraged to engage in moderate to vigorous exercise  for 30 minutes per day most days of the week. We discussed the [employer] fitness program, which is designed for cancer survivors to help them become more physically fit after cancer treatments.  She was instructed to limit her alcohol consumption and continue to abstain from tobacco use.     6. Support services/counseling: It is not uncommon for this period of the patient's cancer care trajectory to be one of many emotions and stressors.  We discussed an opportunity for her to participate in the next session of [facility] ("Finding Your New Normal") support group series designed for patients after they have completed treatment.   Kara Crawford was encouraged to take advantage of our many other support services programs, support groups, and/or counseling in coping with her new life as a cancer survivor after completing anti-cancer treatment. She was given information regarding our available services and encouraged to contact me with any questions or for help enrolling in any of our support group/programs.    Dispo:   -Return to cancer center in 6 months for f/u with Dr. Lindi Adie -She is welcome to return back to the Survivorship Clinic at any time; no additional follow-up needed at this time.  -Consider referral back to survivorship as a long-term survivor for continued surveillance  A total of (30) minutes of face-to-face time was spent with this patient with greater than 50% of that time in counseling and care-coordination.   Gardenia Phlegm, NP Survivorship Program [facility] [phone]   Note: PRIMARY CARE PROVIDER Cari Caraway, [place] [phone]

## 2018-05-01 NOTE — Telephone Encounter (Signed)
Gave patient avs and calendar of upcoming January appointments.

## 2018-05-02 IMAGING — CR DG CHEST 1V PORT
1 series · 1 of 1 positions shown · non-contrast
Comparison: 05/12/2015

CLINICAL DATA: Postop Port-A-Cath insertion.

EXAM:
PORTABLE CHEST 1 VIEW

[ap]
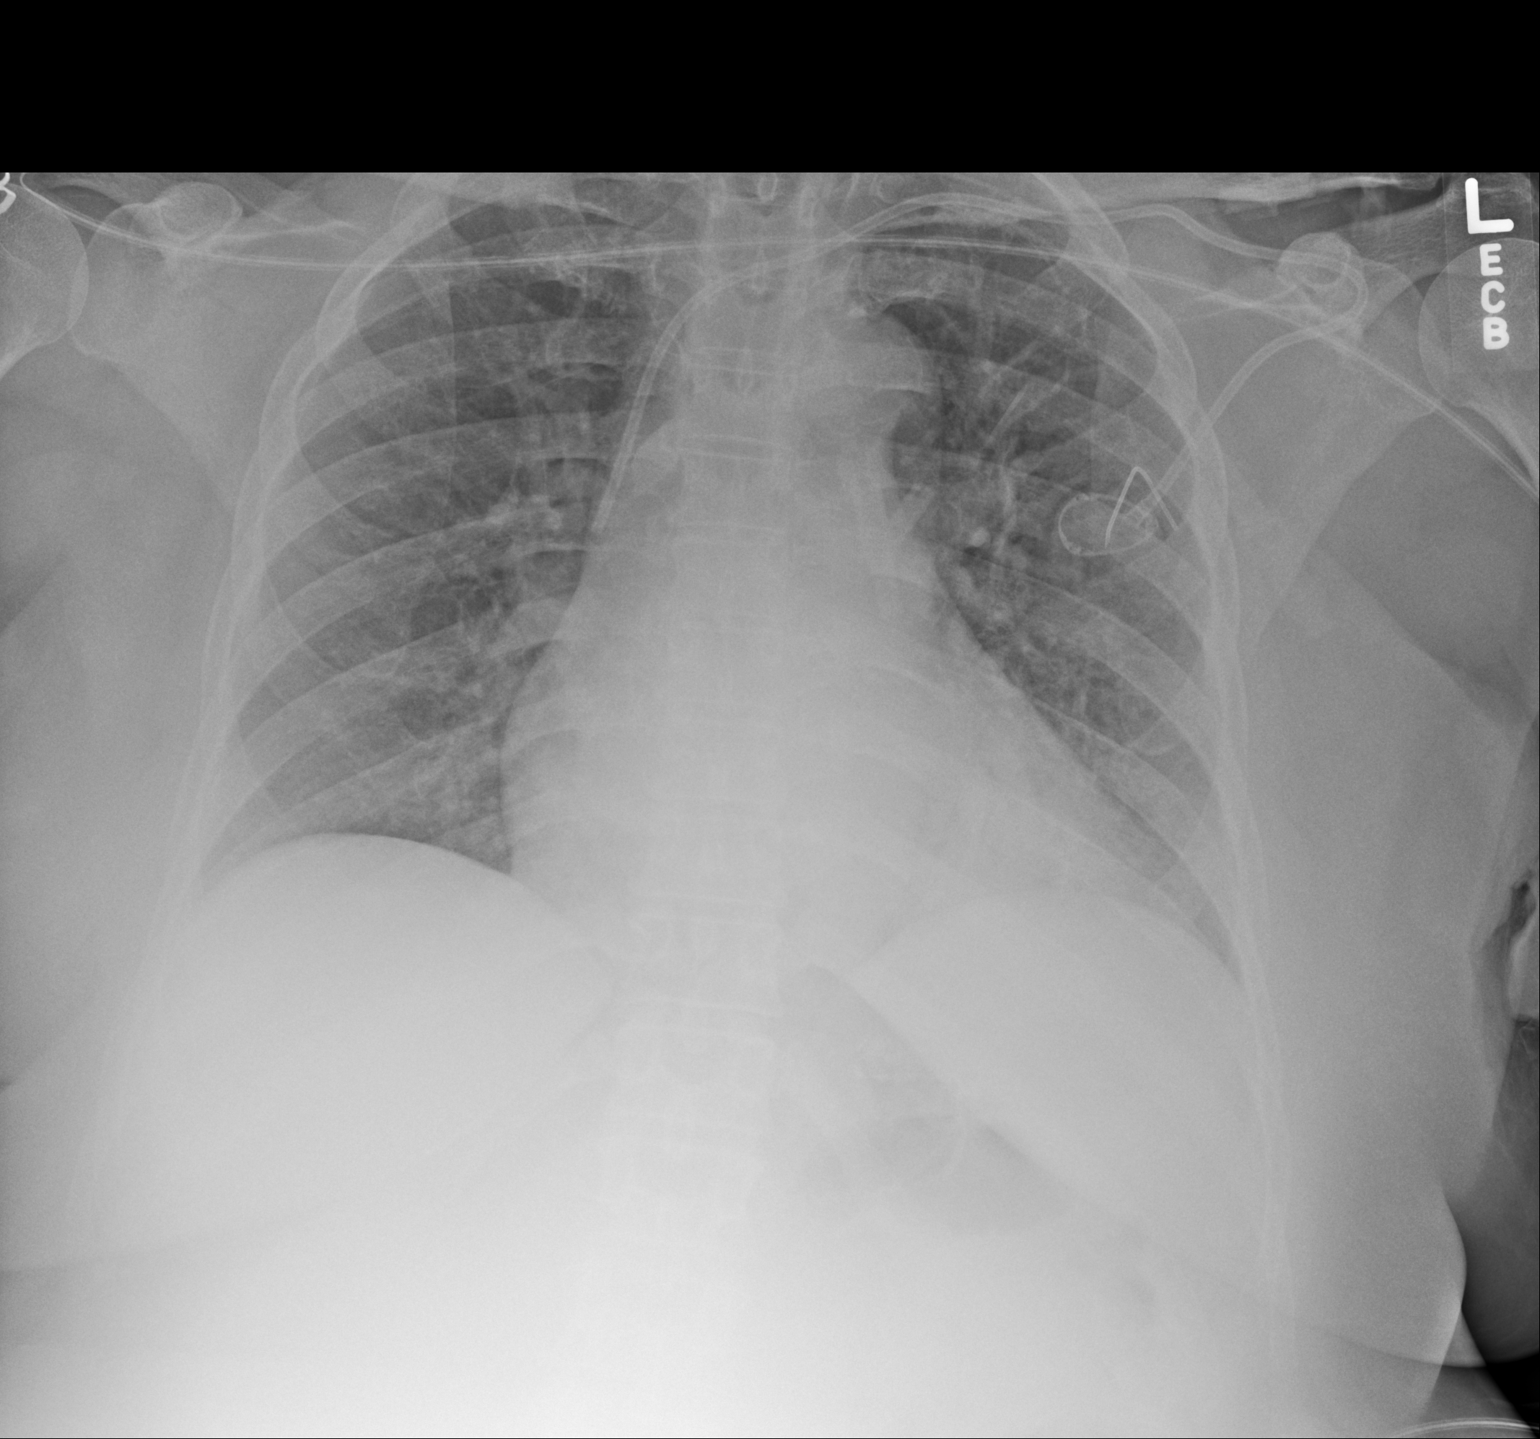

[1 of 1 positions shown; findings below may reference images not displayed]

FINDINGS: LEFT Port-A-Cath enters via a subclavian approach. Tip lies near the
cavoatrial junction. There is no pneumothorax. Mild vascular
congestion. Accentuation of cardiac silhouette due to portable
technique.
IMPRESSION: LEFT Port-A-Cath insertion as described.  No pneumothorax.

## 2018-05-04 ENCOUNTER — Ambulatory Visit (HOSPITAL_BASED_OUTPATIENT_CLINIC_OR_DEPARTMENT_OTHER)
Admission: RE | Admit: 2018-05-04 | Discharge: 2018-05-04 | Disposition: A | Discharge status: Home / Self Care | Payer: 59 | Source: Ambulatory Visit | Attending: Internal Medicine | Admitting: Internal Medicine

## 2018-05-04 ENCOUNTER — Ambulatory Visit (HOSPITAL_COMMUNITY)
Admission: RE | Admit: 2018-05-04 | Discharge: 2018-05-04 | Disposition: A | Discharge status: Home / Self Care | Payer: 59 | Source: Ambulatory Visit | Attending: Internal Medicine | Admitting: Internal Medicine

## 2018-05-04 VITALS — BP 142/88 | HR 64 | Wt 182.8 lb

## 2018-05-04 DIAGNOSIS — I10 Essential (primary) hypertension: Secondary | ICD-10-CM | POA: Diagnosis not present

## 2018-05-04 DIAGNOSIS — C50411 Malignant neoplasm of upper-outer quadrant of right female breast: Secondary | ICD-10-CM

## 2018-05-04 DIAGNOSIS — Z853 Personal history of malignant neoplasm of breast: Secondary | ICD-10-CM | POA: Insufficient documentation

## 2018-05-04 DIAGNOSIS — Z171 Estrogen receptor negative status [ER-]: Secondary | ICD-10-CM | POA: Insufficient documentation

## 2018-05-04 DIAGNOSIS — I361 Nonrheumatic tricuspid (valve) insufficiency: Secondary | ICD-10-CM | POA: Insufficient documentation

## 2018-05-04 NOTE — Progress Notes (Addendum)
CARDIO-ONCOLOGY CLINIC NOTE  Referring Physician: Lindi Adie    HPI:  Kara Crawford is 52 y.o. female with h/o HTN, DM2  breast cancer referred by Dr. Lindi Adie for enrollment into the Cardio-Oncology program.  SUMMARY OF ONCOLOGIC HISTORY:       Malignant neoplasm of upper-outer quadrant of right breast in female, estrogen receptor negative ([place])   07/17/2017 Initial Diagnosis    Right upper outer quadrant painful palpable right breast mass by ultrasound measured 1 cm with 3 abnormal axillary lymph nodes 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, Ki-67 90%, lymph node biopsy also positive, T1bN1 stage IIB AJCC 8. BRCA2+        08/03/2017 -  Neo-Adjuvant Chemotherapy    Dose dense Adriamycin and Cytoxan followed by Taxol weekly 12      08/18/2017 Genetic Testing    Patient had genetic testing due to a personal history of breast cancer and a family history of prostate, breast, and ovarian cancer. The Common Hereditary Cancers Panel was ordered. The Hereditary Gene Panel offered by [facility] includes sequencing and/or deletion duplication testing of the following 46 genes: APC, ATM, AXIN2, BARD1, BMPR1A, BRCA1, BRCA2, BRIP1, CDH1, CDKN2A (p14ARF), CDKN2A (p16INK4a), CHEK2, CTNNA1, DICER1, EPCAM (Deletion/duplication testing only), GREM1 (promoter region deletion/duplication testing only), KIT, MEN1, MLH1, MSH2, MSH3, MSH6, MUTYH, NBN, NF1, NHTL1, PALB2, PDGFRA, PMS2, POLD1, POLE, PTEN, RAD50, RAD51C, RAD51D, SDHB, SDHC, SDHD, SMAD4, SMARCA4. STK11, TP53, TSC1, TSC2, and VHL. The following genes were evaluated for sequence changes only: SDHA and HOXB13 c.251G>A variant only.   Results: POSITIVEfor a mutation in [id number] (p.Asp1540Glyfs*6). The date of this test report is 08/18/2017.       He completed 4 rounds of adriamycin and cytoxan 11/18 follower by 12 weeks of Taxol + carboplatinin. Complete XRT about 2 weeks ago.  Pending reconstruction. Has not exercised for 2 weeks due to discomfort from XRT. Now back to going to gym and walk/run at [employer]. No SOB, orthopnea or PND. Mild lymhpedema on right. No LE edema.   Echo today EF 60-65% GLS -19.6% Personally reviewed Echo 12/18 EF 60-65% Grade I DD  LS'1 11.5cm/s GLS -13.4 (underestimated    Past Medical History:  Diagnosis Date  . Anemia    anemia prior to uterine emboliztion procedure.  . Breast cancer ([place])   . Diabetes mellitus without complication ([place])    type 2  . Family history of breast cancer   . Family history of ovarian cancer   . Family history of prostate cancer   . Fibroids   . Headache   . Heart murmur   . Hyperlipidemia   . Hypertension     Current Outpatient Medications  Medication Sig Dispense Refill  . acetaminophen (TYLENOL) 500 MG tablet Take 1,000 mg by mouth daily as needed for moderate pain or headache.    Marland Kitchen amLODipine (NORVASC) 10 MG tablet Take 10 mg by mouth every morning.     Marland Kitchen atorvastatin (LIPITOR) 80 MG tablet Take 80 mg by mouth every morning.     . Calcium Carbonate-Vitamin D (CALCIUM 500/D PO) Take 2 tablets by mouth daily.    . Cholecalciferol (VITAMIN D3) 1000 units CAPS Take 5,000 Units by mouth daily.    . diphenhydramine-acetaminophen (TYLENOL PM) 25-500 MG TABS tablet Take 1-2 tablets by mouth at bedtime as needed (sleep).    Marland Kitchen lisinopril-hydrochlorothiazide (PRINZIDE,ZESTORETIC) 20-12.5 MG per tablet Take 2 tablets by mouth daily.     . metoprolol succinate (TOPROL-XL) 25  MG 24 hr tablet Take 25 mg by mouth every morning.     . pantoprazole (PROTONIX) 40 MG tablet Take 40 mg by mouth daily.    Vladimir Faster Glycol-Propyl Glycol (SYSTANE OP) Place 1 drop into both eyes daily as needed (dry eyes).    . sitaGLIPtin-metformin (JANUMET) 50-500 MG per tablet Take 1 tablet by mouth 2 (two) times daily with a meal.     . vitamin B-12 (CYANOCOBALAMIN) 1000 MCG tablet Take 2,000 mcg by mouth daily.    .  Vitamin D, Ergocalciferol, (DRISDOL) 50000 units CAPS capsule Take 50,000 Units by mouth every Tuesday.     No current facility-administered medications for this encounter.    Facility-Administered Medications Ordered in Other Encounters  Medication Dose Route Frequency Provider Last Rate Last Dose  . sodium chloride flush (NS) 0.9 % injection 10 mL  10 mL Intravenous PRN Nicholas Lose, MD   10 mL at 08/03/17 1516  . sodium chloride flush (NS) 0.9 % injection 10 mL  10 mL Intravenous PRN Nicholas Lose, MD   10 mL at 08/17/17 1531  . sodium chloride flush (NS) 0.9 % injection 10 mL  10 mL Intravenous PRN Nicholas Lose, MD   10 mL at 10/26/17 1014    No Known Allergies    Social History   Socioeconomic History  . Marital status: Married    Spouse name: Not on file  . Number of children: Not on file  . Years of education: Not on file  . Highest education level: Not on file  Occupational History  . Not on file  Social Needs  . Financial resource strain: Not on file  . Food insecurity:    Worry: Not on file    Inability: Not on file  . Transportation needs:    Medical: Not on file    Non-medical: Not on file  Tobacco Use  . Smoking status: Never Smoker  . Smokeless tobacco: Never Used  Substance and Sexual Activity  . Alcohol use: Yes    Alcohol/week: 0.6 oz    Types: 1 Glasses of wine per week    Comment: social -rare occ.  . Drug use: No  . Sexual activity: Yes  Lifestyle  . Physical activity:    Days per week: Not on file    Minutes per session: Not on file  . Stress: Not on file  Relationships  . Social connections:    Talks on phone: Not on file    Gets together: Not on file    Attends religious service: Not on file    Active member of club or organization: Not on file    Attends meetings of clubs or organizations: Not on file    Relationship status: Not on file  . Intimate partner violence:    Fear of current or ex partner: Not on file    Emotionally abused:  Not on file    Physically abused: Not on file    Forced sexual activity: Not on file  Other Topics Concern  . Not on file  Social History Narrative  . Not on file      Family History  Problem Relation Age of Onset  . Diabetes Mother   . Hypertension Mother   . Stroke Mother   . Diabetes Father   . Hypertension Father   . Prostate cancer Father 57       found at stage II  . Prostate cancer Paternal Uncle 48  . Breast cancer Paternal  Aunt 48  . Cancer Maternal Aunt 55       type unknown  . Ovarian cancer Paternal Grandmother 45  . Cancer Other        type unk- dx in 40's  . Breast cancer Cousin 65    Vitals:   05/04/18 1200  BP: (!) 142/88  Pulse: 64  SpO2: 97%  Weight: 182 lb 12.8 oz (82.9 kg)    PHYSICAL EXAM: General:  Well appearing. No resp difficulty HEENT: normal Neck: supple. no JVD. Carotids 2+ bilat; no bruits. No lymphadenopathy or thryomegaly appreciated. Cor: PMI nondisplaced. Regular rate & rhythm. No rubs, gallops or murmurs. S/p bilateral mastectomies with XRT changes Lungs: clear Abdomen: soft, nontender, nondistended. No hepatosplenomegaly. No bruits or masses. Good bowel sounds. Extremities: no cyanosis, clubbing, rash, edema Neuro: alert & orientedx3, cranial nerves grossly intact. moves all 4 extremities w/o difficulty. Affect pleasant   ASSESSMENT & PLAN: 1. Right Breast Cancer, dx'd 9/19 - T1bN1 stage IIB. Triple negative. BRCA2 + - Has completed 4 rounds of adriamycin and cytoxan 11/18. Now getting 12 weeks of Taxol + carboplatinin and XRT - Echo reviewed personally. EF perfectly stable. No evidence of Adriamycin toxicity at this time.  We discussed very small possibility of delayed cardiotoxicity but this is rare. Can f/u as needed.  2. HTN - mildly elevated. Follow with Dr. Myriam Forehand, MD  12:43 PM

## 2018-05-04 NOTE — Patient Instructions (Signed)
Follow up as needed

## 2018-05-04 NOTE — Progress Notes (Signed)
  Echocardiogram 2D Echocardiogram has been performed.  Kara Crawford 05/04/2018, 11:51 AM

## 2018-05-18 ENCOUNTER — Ambulatory Visit: Payer: Self-pay | Admitting: Radiation Oncology

## 2018-05-23 ENCOUNTER — Encounter: Payer: Self-pay | Admitting: Radiation Oncology

## 2018-05-23 NOTE — Progress Notes (Signed)
Kara Crawford presents for follow up of radiation completed 04/18/18 to her Right Chest wall and Right supraclavicular fossa. Her skin has healed well. There is hyperpigmentation present. She is using a vitamin E lotion to her radiation site. She saw Wilber Bihari NP on 05/01/18 for survivorship and will see Dr. Lindi Adie next on 11/06/18.  BP 127/80   Pulse 78   Temp 99.1 F (37.3 C)   Resp 18   Ht 5' (1.524 m)   Wt 185 lb 9.6 oz (84.2 kg)   LMP 08/19/2015   SpO2 99% Comment: room air  BMI 36.25 kg/m    Wt Readings from Last 3 Encounters:  05/25/18 185 lb 9.6 oz (84.2 kg)  05/04/18 182 lb 12.8 oz (82.9 kg)  05/01/18 181 lb 1.6 oz (82.1 kg)

## 2018-05-25 ENCOUNTER — Ambulatory Visit
Admission: RE | Admit: 2018-05-25 | Discharge: 2018-05-25 | Disposition: A | Discharge status: Home / Self Care | Payer: 59 | Source: Ambulatory Visit | Attending: Radiation Oncology | Admitting: Radiation Oncology

## 2018-05-25 ENCOUNTER — Encounter: Payer: Self-pay | Admitting: Radiation Oncology

## 2018-05-25 ENCOUNTER — Other Ambulatory Visit: Payer: Self-pay

## 2018-05-25 VITALS — BP 127/80 | HR 78 | Temp 99.1°F | Resp 18 | Ht 60.0 in | Wt 185.6 lb

## 2018-05-25 DIAGNOSIS — C50411 Malignant neoplasm of upper-outer quadrant of right female breast: Secondary | ICD-10-CM | POA: Diagnosis present

## 2018-05-25 DIAGNOSIS — E119 Type 2 diabetes mellitus without complications: Secondary | ICD-10-CM | POA: Insufficient documentation

## 2018-05-25 DIAGNOSIS — Z923 Personal history of irradiation: Secondary | ICD-10-CM | POA: Diagnosis not present

## 2018-05-25 DIAGNOSIS — I10 Essential (primary) hypertension: Secondary | ICD-10-CM | POA: Insufficient documentation

## 2018-05-25 DIAGNOSIS — Z171 Estrogen receptor negative status [ER-]: Secondary | ICD-10-CM | POA: Insufficient documentation

## 2018-05-25 DIAGNOSIS — L819 Disorder of pigmentation, unspecified: Secondary | ICD-10-CM | POA: Insufficient documentation

## 2018-05-25 DIAGNOSIS — D0511 Intraductal carcinoma in situ of right breast: Secondary | ICD-10-CM | POA: Diagnosis not present

## 2018-05-25 HISTORY — DX: Personal history of irradiation: Z92.3

## 2018-05-28 ENCOUNTER — Encounter: Payer: Self-pay | Admitting: Radiation Oncology

## 2018-05-28 NOTE — Progress Notes (Signed)
Radiation Oncology         [phone] ________________________________  Name: Kara Crawford MRN: [id number]  Date: 05/25/2018  DOB: 09/15/1966  Follow-Up Visit Note  Outpatient  CC: Cari Caraway, MD  Nicholas Lose, MD  Diagnosis and Prior Radiotherapy:    ICD-10-CM   1. Malignant neoplasm of upper-outer quadrant of right breast in female, estrogen receptor negative ([place]) C50.411    Z17.1     CHIEF COMPLAINT: Here for follow-up and surveillance of breast cancer  Narrative:  The patient returns today for routine follow-up.  She is doing well at follow up of radiation completed 04/18/18 to her Right Chest wall and nodes. Her skin has healed well. There is hyperpigmentation present. She is using a vitamin E lotion to her radiation site. She saw Wilber Bihari NP on 05/01/18 for survivorship and will see Dr. Lindi Adie next on 11/06/18.                              ALLERGIES:  has No Known Allergies.  Meds: Current Outpatient Medications  Medication Sig Dispense Refill  . acetaminophen (TYLENOL) 500 MG tablet Take 1,000 mg by mouth daily as needed for moderate pain or headache.    Marland Kitchen amLODipine (NORVASC) 10 MG tablet Take 10 mg by mouth every morning.     Marland Kitchen atorvastatin (LIPITOR) 80 MG tablet Take 80 mg by mouth every morning.     . Calcium Carbonate-Vitamin D (CALCIUM 500/D PO) Take 2 tablets by mouth daily.    . Cholecalciferol (VITAMIN D3) 1000 units CAPS Take 5,000 Units by mouth daily.    . diphenhydramine-acetaminophen (TYLENOL PM) 25-500 MG TABS tablet Take 1-2 tablets by mouth at bedtime as needed (sleep).    Marland Kitchen lisinopril-hydrochlorothiazide (PRINZIDE,ZESTORETIC) 20-12.5 MG per tablet Take 2 tablets by mouth daily.     . metoprolol succinate (TOPROL-XL) 25 MG 24 hr tablet Take 25 mg by mouth every morning.     . pantoprazole (PROTONIX) 40 MG tablet Take 40 mg by mouth daily.    Vladimir Faster Glycol-Propyl Glycol (SYSTANE OP) Place 1 drop into both eyes daily as needed (dry eyes).    .  sitaGLIPtin-metformin (JANUMET) 50-500 MG per tablet Take 1 tablet by mouth 2 (two) times daily with a meal.     . vitamin B-12 (CYANOCOBALAMIN) 1000 MCG tablet Take 2,000 mcg by mouth daily.    . Vitamin D, Ergocalciferol, (DRISDOL) 50000 units CAPS capsule Take 50,000 Units by mouth every Tuesday.     No current facility-administered medications for this encounter.    Facility-Administered Medications Ordered in Other Encounters  Medication Dose Route Frequency Provider Last Rate Last Dose  . sodium chloride flush (NS) 0.9 % injection 10 mL  10 mL Intravenous PRN Nicholas Lose, MD   10 mL at 08/03/17 1516  . sodium chloride flush (NS) 0.9 % injection 10 mL  10 mL Intravenous PRN Nicholas Lose, MD   10 mL at 08/17/17 1531  . sodium chloride flush (NS) 0.9 % injection 10 mL  10 mL Intravenous PRN Nicholas Lose, MD   10 mL at 10/26/17 1014    Physical Findings: The patient is in no acute distress. Patient is alert and oriented.  height is 5' (1.524 m) and weight is 185 lb 9.6 oz (84.2 kg). Her temperature is 99.1 F (37.3 C). Her blood pressure is 127/80 and her pulse is 78. Her respiration is 18 and oxygen saturation is 99%. Marland Kitchen  Satisfactory skin healing in radiotherapy fields. Modest hyperpigmentation remains   Lab Findings: Lab Results  Component Value Date   WBC 3.3 (L) 01/18/2018   HGB 10.9 (L) 01/18/2018   HCT 33.9 (L) 01/18/2018   MCV 100.9 (H) 01/18/2018   PLT 219 01/18/2018    Radiographic Findings: No results found.  Impression/Plan: Healing well from radiotherapy   Continue skin care with topical Vitamin E Oil and / or lotion for at least 2 more months for further healing.  I encouraged her to continue followup with medical oncology. I will see her back on an as-needed basis. I have encouraged her to call if she has any issues or concerns in the future. I wished her the very best.    _____________________________________   Eppie Gibson, MD

## 2018-06-27 DIAGNOSIS — E559 Vitamin D deficiency, unspecified: Secondary | ICD-10-CM | POA: Diagnosis not present

## 2018-06-27 DIAGNOSIS — E1165 Type 2 diabetes mellitus with hyperglycemia: Secondary | ICD-10-CM | POA: Diagnosis not present

## 2018-06-27 DIAGNOSIS — E782 Mixed hyperlipidemia: Secondary | ICD-10-CM | POA: Diagnosis not present

## 2018-06-28 DIAGNOSIS — C50911 Malignant neoplasm of unspecified site of right female breast: Secondary | ICD-10-CM | POA: Diagnosis not present

## 2018-06-29 DIAGNOSIS — I10 Essential (primary) hypertension: Secondary | ICD-10-CM | POA: Diagnosis not present

## 2018-06-29 DIAGNOSIS — E1159 Type 2 diabetes mellitus with other circulatory complications: Secondary | ICD-10-CM | POA: Diagnosis not present

## 2018-06-29 DIAGNOSIS — E782 Mixed hyperlipidemia: Secondary | ICD-10-CM | POA: Diagnosis not present

## 2018-07-03 ENCOUNTER — Other Ambulatory Visit: Payer: Self-pay | Admitting: Obstetrics and Gynecology

## 2018-07-03 NOTE — H&P (Signed)
Patient:Kara Crawford, Kara Crawford DOB: 04/25/1966 Age: 52 Y Sex: Female Scribe  Phone: [phone] Primary Insurance: [facility] Payer ID: [id number] Address: [place], [place], [place] Account Number: [id number] PCP: [facility] MCNE[facility] Encounter Date: 07/03/2018 Provider: Christophe Louis, MD Appointment [facility]: [facility]   Subjective: Chief Complaint(s):   BRCA 2 mutation   HPI:  General 52 y/o presents for preop history and physical exam in preparation for laporoscopic bilateral salpingooophorectomy. Marland Kitchen she was diagnosed with breast cancer in the right breast 07/2017. she completed chemotherapy and radiation in June 2019. she has BRCA 2 mutation and is at increased risk for fallopian tube and ovarian carcinoma. she desires to have a prophylactic oophorectomy. Current Medication: Taking  Acetaminophen 500 MG Tablet 1 tablet as needed Orally every 6 hrs     Pantoprazole Sodium 40 MG Tablet Delayed Release 1 tablet Orally Once a day, Notes: GI     Sitagliptin-Metformin HCl 50-500 MG Tablet 1 tablet with meals Orally Twice a day     Calcium Citrate + D3(Calcium Citrate-Vitamin D) 200-250 MG-UNIT Tablet 2 tablets by mouth once a day, Notes: OTC     Accu-Chek Nano SmartView w/Device Kit , Notes: MCNEILL     Accu-Chek Nano glucometer strips Glucometer strips For use when checking blood sugars Fingerstick Once daily, alternating mornings and evenings before meals, Notes: MCNEILL     Accu-Chek FastClix Lancets - Miscellaneous Use to test your blood sugar finger stick once a day, Notes: MCNEILL     Vitamin B-12 1000 MCG Tablet 1 tablet Orally Once a day, Notes: OTC     Vitamin D3 2000 UNIT Capsule 1 capsule Orally Once a day     Metoprolol Succinate 50 MG Tablet Extended Release 1 tablet Orally once a day     Lisinopril-Hydrochlorothiazide 20-12.5 MG Tablet take 2 tablets by mouth once daily orally once a day     Amlodipine Besylate 10 MG Tablet take 1 tablet by mouth once daily orally once a day       Rosuvastatin Calcium 40 MG Tablet 1 tablet Orally Once a day     Medication List reviewed and reconciled with the patient   Medical History:  Hypertension     Hyperlipidemia     Impaired Fasting Glucose     Iron Deficiency Anemia     Menomettorhagia - resolved 2011     Morbid Obesity     heart murmur     diabetes mellitus     (R) breast CA - BRCA2 POSITIVE     acid reflux      Allergies/Intolerance:  Metformin HCl - stomach upset   Gyn History:  Sexual activity currently sexually active. LMP 05/2016. Birth control BTL. Last pap smear date 10/11/2016 - ASCUS pap however HPV not detected; repeat 1 year - Dr. Landry Mellow. Last mammogram date 07/20/2017-normal . Abnormal pap smear none. STD none.   OB History:  OB History G-3 P-3 Primary low transverse cesarean section with each delivery. gestational diabetes with second and third pregnancies.. Pregnancy # 1 C-section delivery, live birth. Pregnancy # 2 C-section, live birth. Pregnancy # 3 C-section, live birth.   Surgical History:  BTL at the time of third cesarean section. 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 Hassell Done 05/12/2015     mastectomy w/ radioactive seed guided excision and axillary sentinel lymph node bx, removal port-a-cath - Dr.  Lucia Gaskins 12/2017   Hospitalization:  child birth x 3     mastectomy w/ radioactive seed guided excision and axillary sentinel lymph node bx, removal port-a-cath - Dr. Lucia Gaskins 12/2017   Family History:  Father: alive 24 yrs, Triple Bypass, diagnosed with Diabetes, Hypertension    Mother: alive 61 yrs, Diabetes, Hypertension, CVA    Paternal [place] Mother: ovarian cancer    Brother 1: deceased 43 yrs, car accident    Sister 4: 40 yrs, Diabetes, Hypertension    Maternal aunt: Breast cancer    1 son(s) , 2 daughter(s) .    Maternal aunt breast cancer and died in her 57's. FM died from ovarian  cancer. Children 29, 20, 16 (2019) Negative family h/o colon cancer/polyps or liver disease.  Social History: General no EXPOSURE TO PASSIVE SMOKE.  Alcohol: yes, Rare, wine.  Children: 1, Boys, 2, girls.  Caffeine: yes, coffee 1/2 cup.  no DIET.  Tobacco use cigarettes: Never smoked, Tobacco history last updated 07/03/2018.  Marital Status: married, Married ( gary ).  no Recreational drug use, no.  OCCUPATION: unemployed.  Exercise: yes, walking on treadmill and doing weight training..   ROS: CONSTITUTIONAL No" options="no,yes" propid="91" itemid="193425" categoryid="10464" encounterid="[id number]"Chills No. yes" options="no,yes" propid="91" itemid="172899" categoryid="10464" encounterid="[id number]"Fatigue yes. No" options="no,yes" propid="91" itemid="10467" categoryid="10464" encounterid="[id number]"Fever No. No" options="no,yes" propid="91" itemid="193426" categoryid="10464" encounterid="[id number]"Night sweats No. No" options="no,yes" propid="91" itemid="444261" categoryid="10464" encounterid="[id number]"Recent travel outside [place] No. yes" options="no,yes" propid="91" itemid="193427" categoryid="10464" encounterid="[id number]"Sweats yes. No" options="no,yes" propid="91" itemid="194825" categoryid="10464" encounterid="[id number]"Weight change No.  OPHTHALMOLOGY no" options="no,yes" propid="91" itemid="12520" categoryid="12516" encounterid="[id number]"Blurring of vision no. no" options="no,yes" propid="91" itemid="193469" categoryid="12516" encounterid="[id number]"Change in vision no. no" options="no,yes" propid="91" itemid="194379" categoryid="12516" encounterid="[id number]"Double vision no.  ENT no" options="no,yes" propid="91" itemid="193612" categoryid="10481" encounterid="[id number]"Dizziness no. Nose bleeds no. Sore throat no. yes" options="no,yes" propid="91" itemid="193608" categoryid="10481" encounterid="[id number]"Teeth pain yes.  ALLERGY no" options="no,yes" propid="91" itemid="202589"  categoryid="138152" encounterid="[id number]"Hives no.  CARDIOLOGY no" options="no,yes" propid="91" itemid="193603" categoryid="10488" encounterid="[id number]"Chest pain no. no" options="no,yes" propid="91" itemid="199089" categoryid="10488" encounterid="[id number]"High blood pressure no. no" options="no,yes" propid="91" itemid="202598" categoryid="10488" encounterid="[id number]"Irregular heart beat no. no" options="no,yes" propid="91" itemid="10491" categoryid="10488" encounterid="[id number]"Leg edema no. no" options="no,yes" propid="91" itemid="10490" categoryid="10488" encounterid="[id number]"Palpitations no.  RESPIRATORY no" options="no" propid="91" itemid="270013" categoryid="138132" encounterid="[id number]"Shortness of breath no. no" options="no,yes" propid="91" itemid="172745" categoryid="138132" encounterid="[id number]"Cough no. no" options="no,yes" propid="91" itemid="193621" categoryid="138132" encounterid="10590677"Wheezing no.  UROLOGY no" options="no,yes" propid="91" [phone]" categoryid="138166" encounterid="[id number]"Pain with urination no. no" options="no,yes" propid="91" itemid="193493" categoryid="138166" encounterid="[id number]"Urinary urgency no. no" options="no,yes" propid="91" itemid="193492" categoryid="138166" encounterid="[id number]"Urinary frequency no. no" options="no,yes" propid="91" itemid="138171" categoryid="138166" encounterid="[id number]"Urinary incontinence no. No" options="no,yes" propid="91" itemid="138167" categoryid="138166" encounterid="[id number]"Difficulty urinating No. No" options="no,yes" propid="91" itemid="138168" categoryid="138166" encounterid="10590677"Blood in urine No.  GASTROENTEROLOGY no" options="no,yes" propid="91" itemid="10496" categoryid="10494" encounterid="[id number]"Abdominal pain no. no" options="no,yes" propid="91" itemid="193447" categoryid="10494" encounterid="[id number]"Appetite change no. no" options="no,yes" propid="91" itemid="193448" categoryid="10494"  encounterid="[id number]"Bloating/belching no. no" options="no,yes" propid="91" itemid="10503" categoryid="10494" encounterid="[id number]"Blood in stool or on toilet paper no. no" options="no,yes" propid="91" itemid="199106" categoryid="10494" encounterid="[id number]"Change in bowel movements no. no" options="no,yes" propid="91" itemid="10501" categoryid="10494" encounterid="[id number]"Constipation no. no" options="no,yes" propid="91" itemid="10502" categoryid="10494" encounterid="10590677"Diarrhea no. no" options="no,yes" propid="91" itemid="199104" categoryid="10494" encounterid="10590677"Difficulty swallowing no. no" options="no,yes" propid="91" itemid="10499" categoryid="10494" encounterid="[id number]"Nausea no.  FEMALE REPRODUCTIVE no" options="no,yes" propid="91" itemid="453725" categoryid="10525" encounterid="[id number]"Vulvar pain no. no" options="no,yes" propid="91" itemid="453726" categoryid="10525" encounterid="[id number]"Vulvar rash no. no" options="no, yes" propid="91" itemid="444315" categoryid="10525" encounterid="[id number]"Abnormal vaginal bleeding no. no" options="no,yes" propid="91" itemid="186083" categoryid="10525" encounterid="[id number]"Breast pain no. no" options="no,yes" propid="91" itemid="186084" categoryid="10525" encounterid="[id number]"Nipple discharge no. no" options="no,yes" propid="91" itemid="275823" categoryid="10525" encounterid="[id number]"Pain with intercourse no. no" options="no,yes" propid="91" itemid="186082" categoryid="10525" encounterid="[id number]"Pelvic pain no. no" options="no,yes" propid="91" itemid="278230" categoryid="10525" encounterid="[id number]"Unusual vaginal discharge no. no" options="no,yes" propid="91" itemid="278942" categoryid="10525" encounterid="[id number]"Vaginal itching no.  MUSCULOSKELETAL no" options="no,yes" propid="91" itemid="193461"  categoryid="10514" encounterid="10590677"Muscle aches no.  NEUROLOGY no" options="no,yes" propid="91" itemid="12513" categoryid="12512"  encounterid="[id number]"Headache no. no" options="no,yes" propid="91" itemid="12514" categoryid="12512" encounterid="[id number]"Tingling/numbness no. no" options="no,yes" propid="91" itemid="193468" categoryid="12512" encounterid="10590677"Weakness no.  PSYCHOLOGY no" options="" propid="91" itemid="275919" categoryid="10520" encounterid="10590677"Depression no. no" options="no,yes" propid="91" itemid="172748" categoryid="10520" encounterid="10590677"Anxiety no. no" options="no,yes" propid="91" itemid="199158" categoryid="10520" encounterid="10590677"Nervousness no. yes" options="no,yes" propid="91" itemid="12502" categoryid="10520" encounterid="10590677"Sleep disturbances yes. no " options="no,yes" propid="91" itemid="72718" categoryid="10520" encounterid="10590677"Suicidal ideation no .  ENDOCRINOLOGY no" options="no,yes" propid="91" itemid="194628" categoryid="12508" encounterid="10590677"Excessive thirst no. no" options="no,yes" propid="91" itemid="196285" categoryid="12508" encounterid="10590677"Excessive urination no. no" options="no, yes" propid="91" itemid="444314" categoryid="12508" encounterid="10590677"Hair loss no. no" options="" propid="91" itemid="447284" categoryid="12508" encounterid="10590677"Heat or cold intolerance no.  HEMATOLOGY/LYMPH no" options="no,yes" propid="91" itemid="199152" categoryid="138157" encounterid="10590677"Abnormal bleeding no. no" options="no,yes" propid="91" itemid="170653" categoryid="138157" encounterid="10590677"Easy bruising no. no" options="no,yes" propid="91" itemid="138158" categoryid="138157" encounterid="10590677"Swollen glands no.  DERMATOLOGY no" options="no,yes" propid="91" itemid="199126" categoryid="12503" encounterid="10590677"New/changing skin lesion no. no" options="no,yes" propid="91" itemid="12504" categoryid="12503" encounterid="10590677"Rash no. no" options="" propid="91" itemid="444313" categoryid="12503" encounterid="[id number]"Sores no.      Objective: Vitals: Wt 186, Wt change -1 lb, Ht 60.5, BMI 35.72, Pulse sitting 77, BP sitting 110/70  Past Results: Examination:  General Examination alert, oriented, NAD " categoryPropId="10089" examid="193638"CONSTITUTIONAL: alert, oriented, NAD .  moist, warm" categoryPropId="10109" examid="193638"SKIN: moist, warm.  Conjunctiva clear" categoryPropId="21468" examid="193638"EYES: Conjunctiva clear.  clear to auscultation bilaterally" categoryPropId="87" examid="193638"LUNGS: clear to auscultation bilaterally.  regular rate and rhythm" categoryPropId="86" examid="193638"HEART: regular rate and rhythm.  soft, non-tender/non-distended, bowel sounds present " categoryPropId="88" examid="193638"ABDOMEN: soft, non-tender/non-distended, bowel sounds present .  normal external genitalia, labia - unremarkable, vagina - pink moist mucosa, no lesions or abnormal discharge, cervix - no discharge or lesions or CMT, adnexa - no masses or tenderness, uterus - nontender and normal size on palpation " categoryPropId="13414" examid="193638"FEMALE GENITOURINARY: normal external genitalia, labia - unremarkable, vagina - pink moist mucosa, no lesions or abnormal discharge, cervix - no discharge or lesions or CMT, adnexa - no masses or tenderness, uterus - nontender and normal size on palpation .  affect normal, good eye contact" categoryPropId="16316" examid="193638"PSYCH: affect normal, good eye contact.  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 r/o conversion to laporotomy discussed. pt voiced understanding and desires to proceed.

## 2018-07-05 NOTE — Patient Instructions (Addendum)
Your procedure is scheduled on: Wednesday July 18 2018 at 1:00 pm  Enter through the Main Entrance of [facility] at: 11:30 am  Pick up the phone at the desk and dial [phone].  Call this number if you have problems the morning of surgery: [phone].  Remember: Do NOT eat food: after Midnight on Tuesday September 17 Do NOT drink clear liquids after: 7:00 am day of surgery Take these medicines the morning of surgery with a SIP OF WATER: Norvasc, metoprolol, atorvastatin, pantoprazole. May take systane eye drops if needed  STOP ALL VITAMINS, SUPPLEMENTS, HERBAL MEDICATIONS NOW  DO NOT SMOKE DAY OF SURGERY  BRUSH HOUR TEETH DAY OF SURGERY  Do NOT wear jewelry (body piercing), metal hair clips/bobby pins, make-up, or nail polish. Do NOT wear lotions, powders, or perfumes.  You may wear deoderant. Do NOT shave for 48 hours prior to surgery. Do NOT bring valuables to the hospital. Contacts, dentures, or bridgework may not be worn into surgery. Leave suitcase in car.  After surgery it may be brought to your room.  For patients admitted to the hospital, checkout time is 11:00 AM the day of discharge.  Have a responsible adult drive you home and stay with you for 24 hours after your procedure

## 2018-07-06 ENCOUNTER — Other Ambulatory Visit: Payer: Self-pay

## 2018-07-06 ENCOUNTER — Encounter (HOSPITAL_COMMUNITY): Payer: Self-pay

## 2018-07-06 ENCOUNTER — Encounter (HOSPITAL_COMMUNITY)
Admission: RE | Admit: 2018-07-06 | Discharge: 2018-07-06 | Disposition: A | Discharge status: Home / Self Care | Payer: 59 | Source: Ambulatory Visit | Attending: Obstetrics and Gynecology | Admitting: Obstetrics and Gynecology

## 2018-07-06 DIAGNOSIS — Z01812 Encounter for preprocedural laboratory examination: Secondary | ICD-10-CM | POA: Insufficient documentation

## 2018-07-06 LAB — COMPREHENSIVE METABOLIC PANEL
ALBUMIN: 4.4 g/dL (ref 3.5–5.0)
ALT: 25 U/L (ref 0–44)
AST: 21 U/L (ref 15–41)
Alkaline Phosphatase: 50 U/L (ref 38–126)
Anion gap: 9 (ref 5–15)
BUN: 14 mg/dL (ref 6–20)
CHLORIDE: 103 mmol/L (ref 98–111)
CO2: 28 mmol/L (ref 22–32)
CREATININE: 0.74 mg/dL (ref 0.44–1.00)
Calcium: 9.7 mg/dL (ref 8.9–10.3)
GFR calc Af Amer: >60 mL/min (ref >60)
Glucose, Bld: 114 mg/dL — ABNORMAL HIGH (ref 70–99)
POTASSIUM: 3.7 mmol/L (ref 3.5–5.1)
SODIUM: 140 mmol/L (ref 135–145)
Total Bilirubin: 0.7 mg/dL (ref 0.3–1.2)
Total Protein: 7.7 g/dL (ref 6.5–8.1)

## 2018-07-06 LAB — CBC
HCT: 36.5 % (ref 36.0–46.0)
Hemoglobin: 11.8 g/dL — ABNORMAL LOW (ref 12.0–15.0)
MCH: 31 pg (ref 26.0–34.0)
MCHC: 32.3 g/dL (ref 30.0–36.0)
MCV: 95.8 fL (ref 78.0–100.0)
PLATELETS: 239 10*3/uL (ref 150–400)
RBC: 3.81 MIL/uL — ABNORMAL LOW (ref 3.87–5.11)
RDW: 13.6 % (ref 11.5–15.5)
WBC: 2.6 10*3/uL — AB (ref 4.0–10.5)

## 2018-07-06 LAB — TYPE AND SCREEN
ABO/RH(D): O POS
Antibody Screen: NEGATIVE

## 2018-07-06 LAB — ABO/RH: ABO/RH(D): O POS

## 2018-07-06 NOTE — Pre-Procedure Instructions (Signed)
DR. Cletus Gash MILLER VIEWED EKG, AWARE OF HISTORY

## 2018-07-18 ENCOUNTER — Encounter (HOSPITAL_COMMUNITY): Payer: Self-pay

## 2018-07-18 ENCOUNTER — Other Ambulatory Visit: Payer: Self-pay

## 2018-07-18 ENCOUNTER — Ambulatory Visit (HOSPITAL_COMMUNITY): Payer: 59 | Admitting: Certified Registered Nurse Anesthetist

## 2018-07-18 ENCOUNTER — Encounter (HOSPITAL_COMMUNITY)
Admission: AD | Disposition: A | Discharge status: Home / Self Care | Payer: Self-pay | Source: Ambulatory Visit | Attending: Obstetrics and Gynecology

## 2018-07-18 ENCOUNTER — Ambulatory Visit (HOSPITAL_COMMUNITY)
Admission: AD | Admit: 2018-07-18 | Discharge: 2018-07-18 | Disposition: A | Discharge status: Home / Self Care | Payer: 59 | Source: Ambulatory Visit | Attending: Obstetrics and Gynecology | Admitting: Obstetrics and Gynecology

## 2018-07-18 DIAGNOSIS — Z9221 Personal history of antineoplastic chemotherapy: Secondary | ICD-10-CM | POA: Insufficient documentation

## 2018-07-18 DIAGNOSIS — Z823 Family history of stroke: Secondary | ICD-10-CM | POA: Insufficient documentation

## 2018-07-18 DIAGNOSIS — I10 Essential (primary) hypertension: Secondary | ICD-10-CM | POA: Insufficient documentation

## 2018-07-18 DIAGNOSIS — E119 Type 2 diabetes mellitus without complications: Secondary | ICD-10-CM | POA: Diagnosis not present

## 2018-07-18 DIAGNOSIS — Z79899 Other long term (current) drug therapy: Secondary | ICD-10-CM | POA: Diagnosis not present

## 2018-07-18 DIAGNOSIS — R011 Cardiac murmur, unspecified: Secondary | ICD-10-CM | POA: Diagnosis not present

## 2018-07-18 DIAGNOSIS — D649 Anemia, unspecified: Secondary | ICD-10-CM | POA: Insufficient documentation

## 2018-07-18 DIAGNOSIS — N83202 Unspecified ovarian cyst, left side: Secondary | ICD-10-CM | POA: Diagnosis not present

## 2018-07-18 DIAGNOSIS — Z8041 Family history of malignant neoplasm of ovary: Secondary | ICD-10-CM | POA: Diagnosis not present

## 2018-07-18 DIAGNOSIS — N83201 Unspecified ovarian cyst, right side: Secondary | ICD-10-CM | POA: Insufficient documentation

## 2018-07-18 DIAGNOSIS — Z833 Family history of diabetes mellitus: Secondary | ICD-10-CM | POA: Insufficient documentation

## 2018-07-18 DIAGNOSIS — N852 Hypertrophy of uterus: Secondary | ICD-10-CM | POA: Insufficient documentation

## 2018-07-18 DIAGNOSIS — Z4002 Encounter for prophylactic removal of ovary: Secondary | ICD-10-CM | POA: Diagnosis not present

## 2018-07-18 DIAGNOSIS — Z9884 Bariatric surgery status: Secondary | ICD-10-CM | POA: Diagnosis not present

## 2018-07-18 DIAGNOSIS — K219 Gastro-esophageal reflux disease without esophagitis: Secondary | ICD-10-CM | POA: Diagnosis not present

## 2018-07-18 DIAGNOSIS — Z853 Personal history of malignant neoplasm of breast: Secondary | ICD-10-CM | POA: Insufficient documentation

## 2018-07-18 DIAGNOSIS — Z923 Personal history of irradiation: Secondary | ICD-10-CM | POA: Diagnosis not present

## 2018-07-18 DIAGNOSIS — Z1509 Genetic susceptibility to other malignant neoplasm: Secondary | ICD-10-CM | POA: Insufficient documentation

## 2018-07-18 DIAGNOSIS — Z6835 Body mass index (BMI) 35.0-35.9, adult: Secondary | ICD-10-CM | POA: Diagnosis not present

## 2018-07-18 DIAGNOSIS — Z803 Family history of malignant neoplasm of breast: Secondary | ICD-10-CM | POA: Insufficient documentation

## 2018-07-18 DIAGNOSIS — E785 Hyperlipidemia, unspecified: Secondary | ICD-10-CM | POA: Insufficient documentation

## 2018-07-18 DIAGNOSIS — R8569 Abnormal cytological findings in specimens from other digestive organs and abdominal cavity: Secondary | ICD-10-CM | POA: Diagnosis not present

## 2018-07-18 DIAGNOSIS — Z8249 Family history of ischemic heart disease and other diseases of the circulatory system: Secondary | ICD-10-CM | POA: Diagnosis not present

## 2018-07-18 DIAGNOSIS — Z1501 Genetic susceptibility to malignant neoplasm of breast: Secondary | ICD-10-CM | POA: Diagnosis present

## 2018-07-18 HISTORY — PX: CYSTOSCOPY: SHX5120

## 2018-07-18 HISTORY — PX: LAPAROSCOPIC SALPINGO OOPHERECTOMY: SHX5927

## 2018-07-18 LAB — GLUCOSE, CAPILLARY
GLUCOSE-CAPILLARY: 92 mg/dL (ref 70–99)
GLUCOSE-CAPILLARY: 123 mg/dL — AB (ref 70–99)

## 2018-07-18 SURGERY — SALPINGO-OOPHORECTOMY, LAPAROSCOPIC
Anesthesia: General | Site: Bladder

## 2018-07-18 MED ORDER — PROPOFOL 10 MG/ML IV BOLUS
INTRAVENOUS | Status: DC | PRN
  Administered 2018-07-18: 200 mg via INTRAVENOUS

## 2018-07-18 MED ORDER — ROCURONIUM BROMIDE 100 MG/10ML IV SOLN
INTRAVENOUS | Status: DC | PRN
  Administered 2018-07-18: 50 mg via INTRAVENOUS

## 2018-07-18 MED ORDER — SCOPOLAMINE 1 MG/3DAYS TD PT72
1.0000 | MEDICATED_PATCH | Freq: Once | TRANSDERMAL | Status: DC
  Administered 2018-07-18: 1.5 mg via TRANSDERMAL

## 2018-07-18 MED ORDER — METOCLOPRAMIDE HCL 5 MG/ML IJ SOLN: 10.0000 mg | Freq: Once | INTRAMUSCULAR | Status: DC | PRN

## 2018-07-18 MED ORDER — DEXAMETHASONE SODIUM PHOSPHATE 4 MG/ML IJ SOLN
INTRAMUSCULAR | Status: AC
  Filled 2018-07-18: qty 1

## 2018-07-18 MED ORDER — OXYCODONE HCL 5 MG PO TABS
ORAL_TABLET | ORAL | Status: AC
  Filled 2018-07-18: qty 1

## 2018-07-18 MED ORDER — HYDROCODONE-ACETAMINOPHEN 7.5-325 MG PO TABS: 1.0000 | ORAL_TABLET | Freq: Once | ORAL | Status: DC | PRN

## 2018-07-18 MED ORDER — PROPOFOL 10 MG/ML IV BOLUS
INTRAVENOUS | Status: AC
  Filled 2018-07-18: qty 20

## 2018-07-18 MED ORDER — GLYCOPYRROLATE 0.2 MG/ML IJ SOLN
INTRAMUSCULAR | Status: AC
  Filled 2018-07-18: qty 1

## 2018-07-18 MED ORDER — FENTANYL CITRATE (PF) 100 MCG/2ML IJ SOLN
INTRAMUSCULAR | Status: DC | PRN
  Administered 2018-07-18: 100 ug via INTRAVENOUS
  Administered 2018-07-18 (×2): 50 ug via INTRAVENOUS

## 2018-07-18 MED ORDER — SODIUM CHLORIDE 0.9 % IV SOLN
2.0000 g | INTRAVENOUS | Status: AC
  Administered 2018-07-18: 2 g via INTRAVENOUS

## 2018-07-18 MED ORDER — OXYCODONE HCL 5 MG/5ML PO SOLN: 5.0000 mg | Freq: Once | ORAL | Status: AC | PRN

## 2018-07-18 MED ORDER — ACETAMINOPHEN 10 MG/ML IV SOLN
INTRAVENOUS | Status: DC | PRN
  Administered 2018-07-18: 1000 mg via INTRAVENOUS

## 2018-07-18 MED ORDER — PHENYLEPHRINE 40 MCG/ML (10ML) SYRINGE FOR IV PUSH (FOR BLOOD PRESSURE SUPPORT)
PREFILLED_SYRINGE | INTRAVENOUS | Status: AC
  Filled 2018-07-18: qty 10

## 2018-07-18 MED ORDER — SUGAMMADEX SODIUM 200 MG/2ML IV SOLN
INTRAVENOUS | Status: AC
  Filled 2018-07-18: qty 2

## 2018-07-18 MED ORDER — SUGAMMADEX SODIUM 200 MG/2ML IV SOLN
INTRAVENOUS | Status: DC | PRN
  Administered 2018-07-18: 200 mg via INTRAVENOUS

## 2018-07-18 MED ORDER — OXYCODONE HCL 5 MG PO TABS: 5.0000 mg | ORAL_TABLET | Freq: Four times a day (QID) | ORAL | 0 refills | Status: DC | PRN

## 2018-07-18 MED ORDER — BUPIVACAINE HCL (PF) 0.25 % IJ SOLN
INTRAMUSCULAR | Status: DC | PRN
  Administered 2018-07-18: 30 mL

## 2018-07-18 MED ORDER — STERILE WATER FOR IRRIGATION IR SOLN: Status: DC | PRN

## 2018-07-18 MED ORDER — ROCURONIUM BROMIDE 100 MG/10ML IV SOLN
INTRAVENOUS | Status: AC
  Filled 2018-07-18: qty 1

## 2018-07-18 MED ORDER — METHYLENE BLUE 0.5 % INJ SOLN
INTRAVENOUS | Status: DC | PRN
  Administered 2018-07-18 (×2): 1 mL via INTRAVENOUS

## 2018-07-18 MED ORDER — LIDOCAINE HCL (PF) 1 % IJ SOLN
INTRAMUSCULAR | Status: AC
  Filled 2018-07-18: qty 5

## 2018-07-18 MED ORDER — ONDANSETRON HCL 4 MG/2ML IJ SOLN
INTRAMUSCULAR | Status: AC
  Filled 2018-07-18: qty 2

## 2018-07-18 MED ORDER — OXYCODONE HCL 5 MG PO TABS
5.0000 mg | ORAL_TABLET | Freq: Once | ORAL | Status: AC | PRN
  Administered 2018-07-18: 5 mg via ORAL

## 2018-07-18 MED ORDER — LIDOCAINE HCL (CARDIAC) PF 100 MG/5ML IV SOSY
PREFILLED_SYRINGE | INTRAVENOUS | Status: AC
  Filled 2018-07-18: qty 5

## 2018-07-18 MED ORDER — MEPERIDINE HCL 25 MG/ML IJ SOLN: 6.2500 mg | INTRAMUSCULAR | Status: DC | PRN

## 2018-07-18 MED ORDER — SODIUM CHLORIDE 0.9 % IV SOLN
INTRAVENOUS | Status: AC
  Filled 2018-07-18: qty 2

## 2018-07-18 MED ORDER — ONDANSETRON HCL 4 MG/2ML IJ SOLN
INTRAMUSCULAR | Status: DC | PRN
  Administered 2018-07-18: 4 mg via INTRAVENOUS

## 2018-07-18 MED ORDER — HYDROMORPHONE HCL 1 MG/ML IJ SOLN
INTRAMUSCULAR | Status: AC
  Administered 2018-07-18: 0.5 mg via INTRAVENOUS
  Filled 2018-07-18: qty 0.5

## 2018-07-18 MED ORDER — LIDOCAINE HCL (CARDIAC) PF 100 MG/5ML IV SOSY
PREFILLED_SYRINGE | INTRAVENOUS | Status: DC | PRN
  Administered 2018-07-18: 50 mg via INTRAVENOUS

## 2018-07-18 MED ORDER — DEXAMETHASONE SODIUM PHOSPHATE 10 MG/ML IJ SOLN
INTRAMUSCULAR | Status: DC | PRN
  Administered 2018-07-18: 4 mg via INTRAVENOUS

## 2018-07-18 MED ORDER — EPHEDRINE 5 MG/ML INJ
INTRAVENOUS | Status: AC
  Filled 2018-07-18: qty 10

## 2018-07-18 MED ORDER — LACTATED RINGERS IV SOLN
INTRAVENOUS | Status: DC
  Administered 2018-07-18: 125 mL/h via INTRAVENOUS

## 2018-07-18 MED ORDER — KETOROLAC TROMETHAMINE 30 MG/ML IJ SOLN
INTRAMUSCULAR | Status: AC
  Filled 2018-07-18: qty 1

## 2018-07-18 MED ORDER — FENTANYL CITRATE (PF) 250 MCG/5ML IJ SOLN
INTRAMUSCULAR | Status: AC
  Filled 2018-07-18: qty 5

## 2018-07-18 MED ORDER — STERILE WATER FOR IRRIGATION IR SOLN
Status: DC | PRN
  Administered 2018-07-18: 1000 mL

## 2018-07-18 MED ORDER — PHENYLEPHRINE HCL 10 MG/ML IJ SOLN
INTRAMUSCULAR | Status: DC | PRN
  Administered 2018-07-18: .08 mg via INTRAVENOUS

## 2018-07-18 MED ORDER — SCOPOLAMINE 1 MG/3DAYS TD PT72
MEDICATED_PATCH | TRANSDERMAL | Status: AC
  Administered 2018-07-18: 1.5 mg via TRANSDERMAL
  Filled 2018-07-18: qty 1

## 2018-07-18 MED ORDER — MIDAZOLAM HCL 2 MG/2ML IJ SOLN
INTRAMUSCULAR | Status: DC | PRN
  Administered 2018-07-18: 2 mg via INTRAVENOUS

## 2018-07-18 MED ORDER — MIDAZOLAM HCL 2 MG/2ML IJ SOLN
INTRAMUSCULAR | Status: AC
  Filled 2018-07-18: qty 2

## 2018-07-18 MED ORDER — SODIUM CHLORIDE 0.9 % IR SOLN
Status: DC | PRN
  Administered 2018-07-18: 3000 mL

## 2018-07-18 MED ORDER — GLYCOPYRROLATE 0.2 MG/ML IJ SOLN
INTRAMUSCULAR | Status: DC | PRN
  Administered 2018-07-18: 0.2 mg via INTRAVENOUS

## 2018-07-18 MED ORDER — ACETAMINOPHEN 10 MG/ML IV SOLN
INTRAVENOUS | Status: AC
  Filled 2018-07-18: qty 100

## 2018-07-18 MED ORDER — BUPIVACAINE HCL (PF) 0.25 % IJ SOLN
INTRAMUSCULAR | Status: AC
  Filled 2018-07-18: qty 30

## 2018-07-18 MED ORDER — METHYLENE BLUE 0.5 % INJ SOLN
INTRAVENOUS | Status: AC
  Filled 2018-07-18: qty 10

## 2018-07-18 MED ORDER — HYDROMORPHONE HCL 1 MG/ML IJ SOLN
0.2500 mg | INTRAMUSCULAR | Status: DC | PRN
  Administered 2018-07-18: 0.5 mg via INTRAVENOUS

## 2018-07-18 MED ORDER — EPHEDRINE SULFATE-NACL 50-0.9 MG/10ML-% IV SOSY
PREFILLED_SYRINGE | INTRAVENOUS | Status: DC | PRN
  Administered 2018-07-18 (×2): 10 mg via INTRAVENOUS

## 2018-07-18 SURGICAL SUPPLY — 34 items
APPLICATOR ARISTA FLEXITIP XL (MISCELLANEOUS) ×4 IMPLANT
CABLE HIGH FREQUENCY MONO STRZ (ELECTRODE) IMPLANT
DERMABOND ADVANCED (GAUZE/BANDAGES/DRESSINGS) ×2
DERMABOND ADVANCED .7 DNX12 (GAUZE/BANDAGES/DRESSINGS) ×2 IMPLANT
DRSG OPSITE POSTOP 3X4 (GAUZE/BANDAGES/DRESSINGS) ×4 IMPLANT
DURAPREP 26ML APPLICATOR (WOUND CARE) ×4 IMPLANT
GLOVE BIOGEL M 6.5 STRL (GLOVE) ×8 IMPLANT
GLOVE BIOGEL PI IND STRL 6.5 (GLOVE) ×2 IMPLANT
GLOVE BIOGEL PI IND STRL 7.0 (GLOVE) ×2 IMPLANT
GLOVE BIOGEL PI INDICATOR 6.5 (GLOVE) ×2
GLOVE BIOGEL PI INDICATOR 7.0 (GLOVE) ×2
GOWN STRL REUS W/TWL LRG LVL3 (GOWN DISPOSABLE) ×8 IMPLANT
HEMOSTAT ARISTA ABSORB 3G PWDR (MISCELLANEOUS) ×4 IMPLANT
NEEDLE FILTER BLUNT 18X 1/2SAF (NEEDLE) ×2
NEEDLE FILTER BLUNT 18X1 1/2 (NEEDLE) ×2 IMPLANT
NS IRRIG 1000ML POUR BTL (IV SOLUTION) ×4 IMPLANT
PACK LAPAROSCOPY BASIN (CUSTOM PROCEDURE TRAY) ×4 IMPLANT
PACK TRENDGUARD 450 HYBRID PRO (MISCELLANEOUS) IMPLANT
POUCH SPECIMEN RETRIEVAL 10MM (ENDOMECHANICALS) ×4 IMPLANT
PROTECTOR NERVE ULNAR (MISCELLANEOUS) ×8 IMPLANT
SET CYSTO W/LG BORE CLAMP LF (SET/KITS/TRAYS/PACK) ×4 IMPLANT
SET IRRIG TUBING LAPAROSCOPIC (IRRIGATION / IRRIGATOR) IMPLANT
SHEARS HARMONIC ACE PLUS 36CM (ENDOMECHANICALS) IMPLANT
SOLUTION ELECTROLUBE (MISCELLANEOUS) IMPLANT
SUT VIC AB 4-0 PS2 27 (SUTURE) ×4 IMPLANT
SUT VICRYL 0 UR6 27IN ABS (SUTURE) ×4 IMPLANT
SYR 5ML LL (SYRINGE) ×4 IMPLANT
TOWEL OR 17X24 6PK STRL BLUE (TOWEL DISPOSABLE) ×8 IMPLANT
TRAY FOLEY W/BAG SLVR 14FR (SET/KITS/TRAYS/PACK) ×4 IMPLANT
TRENDGUARD 450 HYBRID PRO PACK (MISCELLANEOUS)
TROCAR XCEL NON-BLD 11X100MML (ENDOMECHANICALS) ×4 IMPLANT
TROCAR XCEL NON-BLD 5MMX100MML (ENDOMECHANICALS) ×4 IMPLANT
TUBING INSUF HEATED (TUBING) ×4 IMPLANT
WARMER LAPAROSCOPE (MISCELLANEOUS) ×4 IMPLANT

## 2018-07-18 NOTE — Op Note (Signed)
07/18/2018  2:00 PM  PATIENT:  Kara Crawford  52 y.o. female  PRE-OPERATIVE DIAGNOSIS:  Z15.09 Genetic susceptiability to other malignant neoplasm  POST-OPERATIVE DIAGNOSIS:  Genetic susceptiability to other malignant neoplasm  PROCEDURE:  Procedure(s) with comments: LAPAROSCOPIC SALPINGO OOPHORECTOMY (Bilateral) - possible laparotomy with abdominal bilateral salpingoophorectomy CYSTOSCOPY (N/A)  SURGEON:  Surgeon(s) and Role:    Christophe Louis, MD - Primary    * Janyth Pupa, DO - Assisting  PHYSICIAN ASSISTANT:   ASSISTANTS: Dr. Janyth Pupa assisted due to concern for adhesive disease.    ANESTHESIA:   general  EBL:  10 mL   BLOOD ADMINISTERED:none  DRAINS: none   LOCAL MEDICATIONS USED:  MARCAINE     SPECIMEN:  Source of Specimen:  Pelvic washings/ bilateral fallopian tubes and ovaries  DISPOSITION OF SPECIMEN:  PATHOLOGY  COUNTS:  YES  TOURNIQUET:  * No tourniquets in log *  DICTATION: .Dragon Dictation  PLAN OF CARE: Discharge to home after PACU  PATIENT DISPOSITION:  PACU - hemodynamically stable.   Delay start of Pharmacological VTE agent (>24hrs) due to surgical blood loss or risk of bleeding: not applicable  Indication: 52 y/o female with BRCA 2 gene mutation. Recent diagnosis of Right breast cancer in 2018. Pt desires prophylactic bilateral salpingo-ophorectomy.   Findings. Adhesions of the colon to the left anterior pelvic side wall. Slightly enlarged uterus. Calcified appearing left and right ovaries.normal appearing fallopian tubes s/p tubal ligation.   Procedure: the patient was taken to the operating room and  placed under general anesthesia. She was  Prepped and draped in the normal sterile fashion. A time out was performed.  A foley catheter was placed. A uterine manipulator was placed. Attention was turned to the abdomen where the umbilicus was injected with 10 cc of marcaine. A 10 mm trocar was placed under direct visualization.  Pneumoperitoneum was achieved with C02 gas... A 5 mm trocar was placed in the right and left lower quadrants. Each trocar site was injected with 10 cc of marcaine prior to trocar placement. The harmonic scalpel was used to excise the adhesions of the colon to the lower left pelvic side wall to allow visualization of the  Left fallopian tube and ovary. . . The left ureter  could not be identified. The left infundibulopelvic ligament was cauterized and transected with the harmonic scalpel. Followed by the uteroovarian  ligament. This was repeated on the right side.  The 10 mm camera was switched to 5 mm camera. The endo-catch was placed through the umbilical port.  The bilateral fallopian tubes and ovaries were placed into the endo-catch and removed through the 10 mm umbilical port. The 10 mm camera was then reconnected. The pelvis was irrigated. Patient was noted to have some bleeding  From the left infundibulopelvic ligament. Hemosasis was achieved using kleppenger. Arista was placed along the adnexa bilaterally.  . All trocars were removed under direct visualization . The pneumoperitoneum was released. The skin incisions were closed with 4-0 vicryl and dermabond.   Due to nonvisualization of the ureters bilaterally a cystoscopy was performed. Foley catheter was removed. Cystoscopy was performed using 30 degree scope. Both ureters were identified efflux of methylene blue stained urine was noted from both ureters. Sponge lap and needle counts were correct x 2.  The patient was awakened from anesthesia. She was taken to the recovery room awake and in stable condition.

## 2018-07-18 NOTE — Transfer of Care (Signed)
Immediate Anesthesia Transfer of Care Note  Patient: Kara Crawford  Procedure(s) Performed: LAPAROSCOPIC SALPINGO OOPHORECTOMY (Bilateral Abdomen) CYSTOSCOPY (N/A Bladder)  Patient Location: PACU  Anesthesia Type:General  Level of Consciousness: awake, alert  and oriented  Airway & Oxygen Therapy: Patient Spontanous Breathing and Patient connected to nasal cannula oxygen  Post-op Assessment: Report given to RN and Post -op Vital signs reviewed and stable  Post vital signs: Reviewed and stable  Last Vitals:  Vitals Value Taken Time  BP    Temp    Pulse    Resp    SpO2      Last Pain:  Vitals:   07/18/18 1053  TempSrc: Oral  PainSc: 0-No pain      Patients Stated Pain Goal: 4 (16/10/96 0454)  Complications: No apparent anesthesia complications

## 2018-07-18 NOTE — Anesthesia Postprocedure Evaluation (Signed)
Anesthesia Post Note  Patient: Kara Crawford  Procedure(s) Performed: LAPAROSCOPIC SALPINGO OOPHORECTOMY (Bilateral Abdomen) CYSTOSCOPY (N/A Bladder)     Patient location during evaluation: PACU Anesthesia Type: General Level of consciousness: awake and alert and oriented Pain management: pain level controlled Vital Signs Assessment: post-procedure vital signs reviewed and stable Respiratory status: spontaneous breathing, nonlabored ventilation and respiratory function stable Cardiovascular status: blood pressure returned to baseline and stable Postop Assessment: no apparent nausea or vomiting Anesthetic complications: no    Last Vitals:  Vitals:   07/18/18 1415 07/18/18 1430  BP: 110/69 109/65  Pulse: 73 64  Resp: 15 14  Temp:    SpO2: 96% 96%    Last Pain:  Vitals:   07/18/18 1430  TempSrc:   PainSc: Asleep   Pain Goal: Patients Stated Pain Goal: 4 (07/18/18 1053)               Lenka Zhao A.

## 2018-07-18 NOTE — Discharge Instructions (Signed)
DISCHARGE INSTRUCTIONS: Laparoscopy ° °The following instructions have been prepared to help you care for yourself upon your return home today. ° °Wound care: °• Do not get the incision wet for the first 24 hours. The incision should be kept clean and dry. °• The Band-Aids or dressings may be removed the day after surgery. °• Should the incision become sore, red, and swollen after the first week, check with your doctor. ° °Personal hygiene: °• Shower the day after your procedure. ° °Activity and limitations: °• Do NOT drive or operate any equipment today. °• Do NOT lift anything more than 15 pounds for 2-3 weeks after surgery. °• Do NOT rest in bed all day. °• Walking is encouraged. Walk each day, starting slowly with 5-minute walks 3 or 4 times a day. Slowly increase the length of your walks. °• Walk up and down stairs slowly. °• Do NOT do strenuous activities, such as golfing, playing tennis, bowling, running, biking, weight lifting, gardening, mowing, or vacuuming for 2-4 weeks. Ask your doctor when it is okay to start. ° °Diet: Eat a light meal as desired this evening. You may resume your usual diet tomorrow. ° °Return to work: This is dependent on the type of work you do. For the most part you can return to a desk job within a week of surgery. If you are more active at work, please discuss this with your doctor. ° °What to expect after your surgery: You may have a slight burning sensation when you urinate on the first day. You may have a very small amount of blood in the urine. Expect to have a small amount of vaginal discharge/light bleeding for 1-2 weeks. It is not unusual to have abdominal soreness and bruising for up to 2 weeks. You may be tired and need more rest for about 1 week. You may experience shoulder pain for 24-72 hours. Lying flat in bed may relieve it. ° °Call your doctor for any of the following: °• Develop a fever of 100.4 or greater °• Inability to urinate 6 hours after discharge from  hospital °• Severe pain not relieved by pain medications °• Persistent of heavy bleeding at incision site °• Redness or swelling around incision site after a week °• Increasing nausea or vomiting ° ° °Post Anesthesia Home Care Instructions ° °Activity: °Get plenty of rest for the remainder of the day. A responsible individual must stay with you for 24 hours following the procedure.  °For the next 24 hours, DO NOT: °-Drive a car °-Operate machinery °-Drink alcoholic beverages °-Take any medication unless instructed by your physician °-Make any legal decisions or sign important papers. ° °Meals: °Start with liquid foods such as gelatin or soup. Progress to regular foods as tolerated. Avoid greasy, spicy, heavy foods. If nausea and/or vomiting occur, drink only clear liquids until the nausea and/or vomiting subsides. Call your physician if vomiting continues. ° °Special Instructions/Symptoms: °Your throat may feel dry or sore from the anesthesia or the breathing tube placed in your throat during surgery. If this causes discomfort, gargle with warm salt water. The discomfort should disappear within 24 hours. ° °If you had a scopolamine patch placed behind your ear for the management of post- operative nausea and/or vomiting: ° °1. The medication in the patch is effective for 72 hours, after which it should be removed.  Wrap patch in a tissue and discard in the trash. Wash hands thoroughly with soap and water. °2. You may remove the patch earlier than 72   hours if you experience unpleasant side effects which may include dry mouth, dizziness or visual disturbances. °3. Avoid touching the patch. Wash your hands with soap and water after contact with the patch. °   ° ° °

## 2018-07-18 NOTE — Anesthesia Preprocedure Evaluation (Signed)
Anesthesia Evaluation  Patient identified by MRN, date of birth, ID band Patient awake    Reviewed: Allergy & Precautions, NPO status , Patient's Chart, lab work & pertinent test results, reviewed documented beta blocker date and time   Airway Mallampati: I       Dental no notable dental hx. (+) Teeth Intact   Pulmonary neg pulmonary ROS,    Pulmonary exam normal breath sounds clear to auscultation       Cardiovascular hypertension, Pt. on medications and Pt. on home beta blockers Normal cardiovascular exam+ Valvular Problems/Murmurs  Rhythm:Regular Rate:Normal     Neuro/Psych  Headaches, negative psych ROS   GI/Hepatic Neg liver ROS, GERD  Medicated and Controlled,  Endo/Other  diabetes, Well Controlled, Type 2, Oral Hypoglycemic Agents  Renal/GU negative Renal ROS  negative genitourinary   Musculoskeletal negative musculoskeletal ROS (+)   Abdominal (+) + obese,   Peds  Hematology  (+) Blood dyscrasia, anemia ,   Anesthesia Other Findings                             *[facility]*                         [address], [place] [address]                            [phone]  ------------------------------------------------------------------- Transthoracic Echocardiography  Patient:    Kara Crawford, [place] MR #:       [id number] Study Date: 10/25/2017 Gender:     F Age:        52 Height:     154.9 cm Weight:     82.1 kg BSA:        1.92 m^2 Pt. Status: Room:   ATTENDING    Default, Provider [phone]  Ellin Saba [phone]  ORDERING     Bensimhon, Daniel  REFERRING    Bensimhon, Daniel  PERFORMING   [facility], Outpatient  SONOGRAPHER  Jannett Celestine, RDCS  cc:  ------------------------------------------------------------------- LV EF: 60% -    65%  ------------------------------------------------------------------- History:   PMH:  Breast Cancer.  Murmur.  Risk factors: Hypertension. Diabetes mellitus. Dyslipidemia.  ------------------------------------------------------------------- Study Conclusions  - Left ventricle: GLLS appears inaccurate due to inaccurate   tracking. The cavity size was normal. There was moderate   concentric hypertrophy. Systolic function was normal. The   estimated ejection fraction was in the range of 60% to 65%. Wall   motion was normal; there were no regional wall motion   abnormalities. There was an increased relative contribution of   atrial contraction to ventricular filling. Doppler parameters are   consistent with abnormal left ventricular relaxation (grade 1   diastolic dysfunction).  -------------------------------------------------------------------    Reproductive/Obstetrics Genetic susceptibility to other malignant neoplasm                              Anesthesia Physical  Anesthesia Plan  ASA: II  Anesthesia Plan: General   Post-op Pain Management:  Regional for Post-op pain  Induction: Intravenous  PONV Risk Score and Plan: 3 and Ondansetron, Dexamethasone, Midazolam and Treatment may vary due to age or medical condition  Airway Management Planned: Oral ETT  Additional Equipment:   Intra-op Plan:   Post-operative Plan: Extubation in OR  Informed Consent: I have reviewed the patients History and Physical, chart, labs and discussed the procedure including the risks, benefits and alternatives for the proposed anesthesia with the patient or authorized representative who has indicated his/her understanding and acceptance.   Dental advisory given  Plan Discussed with: CRNA and Surgeon  Anesthesia Plan Comments:         Anesthesia Quick Evaluation

## 2018-07-18 NOTE — H&P (Signed)
Date of Initial H&P: 07/03/2018 History reviewed, patient examined, no change in status, stable for surgery.

## 2018-07-18 NOTE — Anesthesia Procedure Notes (Signed)
Procedure Name: Intubation Date/Time: 07/18/2018 12:01 PM Performed by: Bufford Spikes, CRNA Pre-anesthesia Checklist: Patient identified, Emergency Drugs available, Suction available and Patient being monitored Patient Re-evaluated:Patient Re-evaluated prior to induction Oxygen Delivery Method: Circle system utilized Preoxygenation: Pre-oxygenation with 100% oxygen Induction Type: IV induction Ventilation: Mask ventilation without difficulty Laryngoscope Size: Miller and 2 Grade View: Grade II Tube type: Oral Tube size: 7.0 mm Number of attempts: 1 Airway Equipment and Method: Stylet and Oral airway Placement Confirmation: ETT inserted through vocal cords under direct vision,  positive ETCO2 and breath sounds checked- equal and bilateral Secured at: 21 cm Tube secured with: Tape Dental Injury: Teeth and Oropharynx as per pre-operative assessment

## 2018-07-19 ENCOUNTER — Encounter (HOSPITAL_COMMUNITY): Payer: Self-pay | Admitting: Obstetrics and Gynecology

## 2018-09-10 DIAGNOSIS — Z421 Encounter for breast reconstruction following mastectomy: Secondary | ICD-10-CM | POA: Diagnosis not present

## 2018-09-10 DIAGNOSIS — E119 Type 2 diabetes mellitus without complications: Secondary | ICD-10-CM | POA: Diagnosis not present

## 2018-09-10 DIAGNOSIS — I159 Secondary hypertension, unspecified: Secondary | ICD-10-CM | POA: Diagnosis not present

## 2018-09-11 DIAGNOSIS — Z23 Encounter for immunization: Secondary | ICD-10-CM | POA: Diagnosis not present

## 2018-11-01 NOTE — Progress Notes (Signed)
Patient Care Team: Cari Caraway, MD as PCP - General (Family Medicine) Christophe Louis, MD as Consulting Physician (Obstetrics and Gynecology) Alphonsa Overall, MD as Consulting Physician (General Surgery) Nicholas Lose, MD as Consulting Physician (Hematology and Oncology) Eppie Gibson, MD as Attending Physician (Radiation Oncology) Gardenia Phlegm, NP as Nurse Practitioner (Hematology and Oncology)  DIAGNOSIS:    ICD-10-CM   1. 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  The Common Hereditary Cancers Panel was ordered.  The Hereditary Gene Panel offered by [facility] includes sequencing and/or deletion duplication testing of the following 46 genes: APC, ATM, AXIN2, BARD1, BMPR1A, BRCA1, BRCA2, BRIP1, CDH1, CDKN2A (p14ARF), CDKN2A (p16INK4a), CHEK2, CTNNA1, DICER1, EPCAM (Deletion/duplication testing only), GREM1 (promoter region deletion/duplication testing only), KIT, MEN1, MLH1, MSH2, MSH3, MSH6, MUTYH, NBN, NF1, NHTL1, PALB2, PDGFRA, PMS2, POLD1, POLE, PTEN, RAD50, RAD51C, RAD51D, SDHB, SDHC, SDHD, SMAD4, SMARCA4. STK11, TP53, TSC1, TSC2, and VHL.  The following genes were evaluated for sequence  changes only: SDHA and HOXB13 c.251G>A variant only.       Results: POSITIVE for a mutation in BRCA2 c.[id number] (p.Asp1540Glyfs*6).  The date of this test report is 08/18/2017.    12/15/2017 Breast MRI    Radiologic complete response    01/22/2018 Surgery    Bilateral mastectomies: Right mastectomy: No residual cancer, 0/5 lymph nodes negative; left mastectomy: UDH, FA, no malignancy, ER 0%, PR 0%, HER-2 negative, Ki-67 90%, complete pathologic response    03/07/2018 - 04/18/2018 Radiation Therapy    1) Right Chest Wall / 50 Gy in 25 fractions 2) Right Supraclavicular fossa/ 50 Gy in 25 fractions 3) Right Chest Wall Scar boost / 10 Gy in 5 fractions       07/18/2018 Surgery    Bil Salpingo-Oophorectomy     CHIEF COMPLIANT: Surveillance of right breast cancer  INTERVAL HISTORY: Kara Crawford is a 53 y.o. with above-mentioned history of BRCA2 mutation positive, triple negative breast cancer who underwent neoadjuvant chemotherapy and had a radiologic complete response.  She underwent bilateral mastectomies and the final pathology report did not identify any evidence of cancer. She underwent radiation, which finished on 04/18/18. On 07/18/18 she had a salpingo-oopherectomy. She presents to the clinic today with her husband. She is quite anxious to come here today and is worried about recurrence. She reports stiffness and pain in her left arm and shoulder, and has not been exercising regularly. She reports occasional pain at the site of her mastectomies. She and her husband report memory loss. She notes her mother was recently diagnosed with stage 4 lung cancer, but she is handling it well. She expressed interest in participating in the [facility] or [facility] clinical trials. Her husband expressed  concern about natural remedies she takes interacting with the clinical trial. She reviewed her medication list with me.   REVIEW OF SYSTEMS:   Constitutional: Denies fevers, chills or abnormal weight  loss Eyes: Denies blurriness of vision Ears, nose, mouth, throat, and face: Denies mucositis or sore throat Respiratory: Denies cough, dyspnea or wheezes Cardiovascular: Denies palpitation, chest discomfort Gastrointestinal:  Denies nausea, heartburn or change in bowel habits Skin: Denies abnormal skin rashes MSK: (+) left arm and shoulder pain Lymphatics: Denies new lymphadenopathy or easy bruising Neurological:Denies numbness, tingling or new weaknesses Behavioral/Psych: (+) anxiety (+) memory loss Extremities: No lower extremity edema Breast: denies any lumps or nodules in either breasts (+) pain in chest at surgical site All other systems were reviewed with the patient and are negative.  I have reviewed the past medical history, past surgical history, social history and family history with the patient and they are unchanged from previous note.  ALLERGIES:  has No Known Allergies.  MEDICATIONS:  Current Outpatient Medications  Medication Sig Dispense Refill  . acetaminophen (TYLENOL) 500 MG tablet Take 1,000 mg by mouth daily as needed for moderate pain or headache.    Marland Kitchen amLODipine (NORVASC) 10 MG tablet Take 10 mg by mouth every morning.     Marland Kitchen atorvastatin (LIPITOR) 80 MG tablet Take 80 mg by mouth every morning.     . Calcium Citrate-Vitamin D (CALCIUM + D PO) Take 2 tablets by mouth daily.    . diphenhydramine-acetaminophen (TYLENOL PM) 25-500 MG TABS tablet Take 1-2 tablets by mouth at bedtime as needed (sleep).    Marland Kitchen lisinopril-hydrochlorothiazide (PRINZIDE,ZESTORETIC) 20-12.5 MG per tablet Take 2 tablets by mouth daily.     . metoprolol succinate (TOPROL-XL) 50 MG 24 hr tablet Take 50 mg by mouth daily. Take with or immediately following a meal.    . pantoprazole (PROTONIX) 40 MG tablet Take 40 mg by mouth daily.    Vladimir Faster Glycol-Propyl Glycol (SYSTANE OP) Place 1 drop into both eyes daily as needed (dry eyes).    . vitamin B-12 (CYANOCOBALAMIN) 1000 MCG tablet Take 1,000  mcg by mouth daily.      No current facility-administered medications for this visit.    Facility-Administered Medications Ordered in Other Visits  Medication Dose Route Frequency Provider Last Rate Last Dose  . sodium chloride flush (NS) 0.9 % injection 10 mL  10 mL Intravenous PRN Nicholas Lose, MD   10 mL at 08/03/17 1516  . sodium chloride flush (NS) 0.9 % injection 10 mL  10 mL Intravenous PRN Nicholas Lose, MD   10 mL at 08/17/17 1531  . sodium chloride flush (NS) 0.9 % injection 10 mL  10 mL Intravenous PRN Nicholas Lose, MD   10 mL at 10/26/17 1014    PHYSICAL EXAMINATION: ECOG PERFORMANCE STATUS: 1 - Symptomatic but completely ambulatory  Vitals:   11/06/18 0904  BP: 106/68  Pulse: 81  Resp: 18  Temp: 98.6 F (37 C)  SpO2: 100%   Filed Weights   11/06/18 0904  Weight: 181 lb 11.2 oz (82.4 kg)    GENERAL:alert, no distress and comfortable SKIN: skin color, texture, turgor are normal, no rashes or significant lesions EYES: normal, Conjunctiva are pink and non-injected, sclera clear OROPHARYNX:no exudate, no erythema and lips, buccal mucosa, and tongue normal  NECK: supple, thyroid normal size, non-tender, without nodularity LYMPH:  no palpable lymphadenopathy in the cervical, axillary or inguinal LUNGS: clear to auscultation and percussion with normal breathing effort HEART: regular rate &  rhythm and no murmurs and no lower extremity edema ABDOMEN:abdomen soft, non-tender and normal bowel sounds MUSCULOSKELETAL:no cyanosis of digits and no clubbing  NEURO: alert & oriented x 3 with fluent speech, no focal motor/sensory deficits EXTREMITIES: No lower extremity edema BREAST: No palpable lumps or nodules in bilateral mastectomies. (exam performed in the presence of a chaperone)  LABORATORY DATA:  I have reviewed the data as listed CMP Latest Ref Rng & Units 07/06/2018 01/18/2018 12/14/2017  Glucose 70 - 99 mg/dL 114(H) 104(H) 109  BUN 6 - 20 mg/dL _0 Creatinine  0.44 - 1.00 mg/dL 0.74 0.81 0.90  Sodium 135 - 145 mmol/L 140 139 142  Potassium 3.5 - 5.1 mmol/L 3.7 3.7 4.0  Chloride 98 - 111 mmol/L 103 104 105  CO2 22 - 32 mmol/L _1 Calcium 8.9 - 10.3 mg/dL 9.7 9.6 10.1  Total Protein 6.5 - 8.1 g/dL 7.7 - 7.2  Total Bilirubin 0.3 - 1.2 mg/dL 0.7 - 0.3  Alkaline Phos 38 - 126 U/L 50 - 49  AST 15 - 41 U/L 21 - 25  ALT 0 - 44 U/L 25 - 39    Lab Results  Component Value Date   WBC 2.6 (L) 07/06/2018   HGB 11.8 (L) 07/06/2018   HCT 36.5 07/06/2018   MCV 95.8 07/06/2018   PLT 239 07/06/2018   NEUTROABS 1.4 (L) 12/14/2017    ASSESSMENT & PLAN:  Malignant neoplasm of upper-outer quadrant of right breast in female, estrogen receptor negative ([place]) 07/17/2017:Right upper outer quadrant painful palpable right breast mass by ultrasound measured 1 cm with 3 abnormal axillary lymph nodes 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  Chest wall examination no palpable lumps or nodules 2.  No role of imaging studies since she had bilateral mastectomies Patient underwent bilateral salpingo-oophorectomy 07/18/2018.  I discussed with her about ABC clinical trial and she will be provided with information on the trial. The trial randomizes patients between aspirin versus placebo. I discussed with her the importance of intermittent fasting.  Return to clinic in 1 year for follow-up or sooner if she goes on the trial.   No orders of the defined types were placed in this encounter.  The patient has a good understanding of the  overall plan. she agrees with it. she will call with any problems that may develop before the next visit here.  Nicholas Lose, MD 11/06/2018   I, Cloyde Reams Dorshimer, am acting as scribe for Nicholas Lose, MD.  I have reviewed the above documentation for accuracy and completeness, and I agree with the above.

## 2018-11-06 ENCOUNTER — Inpatient Hospital Stay: Payer: 59 | Attending: Hematology and Oncology | Admitting: Hematology and Oncology

## 2018-11-06 ENCOUNTER — Telehealth: Payer: Self-pay | Admitting: Hematology and Oncology

## 2018-11-06 ENCOUNTER — Other Ambulatory Visit: Payer: Self-pay

## 2018-11-06 ENCOUNTER — Encounter: Payer: Self-pay | Admitting: *Deleted

## 2018-11-06 DIAGNOSIS — Z1509 Genetic susceptibility to other malignant neoplasm: Secondary | ICD-10-CM | POA: Diagnosis not present

## 2018-11-06 DIAGNOSIS — Z923 Personal history of irradiation: Secondary | ICD-10-CM | POA: Diagnosis not present

## 2018-11-06 DIAGNOSIS — Z79899 Other long term (current) drug therapy: Secondary | ICD-10-CM | POA: Insufficient documentation

## 2018-11-06 DIAGNOSIS — C50411 Malignant neoplasm of upper-outer quadrant of right female breast: Secondary | ICD-10-CM

## 2018-11-06 DIAGNOSIS — Z171 Estrogen receptor negative status [ER-]: Secondary | ICD-10-CM

## 2018-11-06 DIAGNOSIS — Z9013 Acquired absence of bilateral breasts and nipples: Secondary | ICD-10-CM | POA: Diagnosis not present

## 2018-11-06 DIAGNOSIS — C773 Secondary and unspecified malignant neoplasm of axilla and upper limb lymph nodes: Secondary | ICD-10-CM | POA: Diagnosis not present

## 2018-11-06 MED ORDER — LORAZEPAM 0.5 MG PO TABS: 0.5000 mg | ORAL_TABLET | Freq: Three times a day (TID) | ORAL | 0 refills | Status: DC

## 2018-11-06 NOTE — Progress Notes (Signed)
[facility] [id number] "A RANDOMIZED PHASE III DOUBLE BLINDED PLACEBO CONTROLLED TRIAL OF ASPIRIN AS ADJUVANT THERAPY FOR NODE POSTIVE HER2 NEGATIVE BREAST CANCER: THE ABC TRIAL" Dr. Lindi Adie referred patient for the ABC study.  Met with patient and her husband in exam room along with another research RN, Johney Maine.  Spent 10 minutes briefly explaining the purpose, possible benefits and risks of this study.  Informed patient participation is voluntary. Informed patient research RN will review her chart for eligibility and patient agreed.  Provided patient with copy of consent and hippa forms for this study for patient to review at home. Patient agreed for research RN to call her after eligibility review to assess patient's interest and answer any questions.  DCP-001 Use of a Clinical Trial Screening Tool to Address Cancer Health Disparities in the [facility]) Provided a copy of consent form for the DCP study.  Informed patient this study collects data from patients screened on NCI trials to help improve enrollment on NCI trials in the future.  Informed patient this is also voluntary and may be done over the phone if needed.  Patient verbalized understanding. Thanked patient and her husband for their time today and gave patient a clinical research informational brochure along with my business card. Encouraged patient to call research RN if any questions prior to next contact.  She verbalized understanding.  Foye Spurling, BSN, RN Clinical Research Nurse 11/06/2018 10:19 AM

## 2018-11-06 NOTE — Assessment & Plan Note (Addendum)
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  Chest wall examination no palpable lumps or nodules 2.  No role of imaging studies since she had bilateral mastectomies Patient will need oophorectomy.  Return to clinic in 1 year for follow-up

## 2018-11-06 NOTE — Telephone Encounter (Signed)
Gave avs and calendar ° °

## 2018-11-13 ENCOUNTER — Telehealth: Payer: Self-pay

## 2018-11-13 NOTE — Telephone Encounter (Signed)
[facility] [phone] ABC  Spoke with patient briefly for approximately 5 minutes. Patient had not had a chance to go over study as her mother is in the hospital. RN expressed her sympathies. Patient confirmed it was okay to call her in 2 weeks when things have "calmed down." Patient was thanked for her time and interest. Johney Maine RN, BSN Clinical Research  11/13/2018 1007

## 2018-11-17 DIAGNOSIS — M25512 Pain in left shoulder: Secondary | ICD-10-CM | POA: Diagnosis not present

## 2018-11-27 ENCOUNTER — Telehealth: Payer: Self-pay

## 2018-11-27 NOTE — Telephone Encounter (Signed)
Spoke with pt about ABC trial and if she had had time to read the information. Per pt, her mother is still sick so she hasn't had time. Pt also concerned about taking aspirin with her history of bowel perforation. Encouraged pt to reach out to her GI doctor. Pt confirmed it was okay to call back in about 2 weeks. Thanked pt for her time and interest. Johney Maine RN, BSN Clinical Research  11/27/2018 1006

## 2018-12-13 DIAGNOSIS — C50911 Malignant neoplasm of unspecified site of right female breast: Secondary | ICD-10-CM | POA: Diagnosis not present

## 2018-12-28 DIAGNOSIS — M75102 Unspecified rotator cuff tear or rupture of left shoulder, not specified as traumatic: Secondary | ICD-10-CM | POA: Diagnosis not present

## 2018-12-31 DIAGNOSIS — E1159 Type 2 diabetes mellitus with other circulatory complications: Secondary | ICD-10-CM | POA: Diagnosis not present

## 2018-12-31 DIAGNOSIS — E782 Mixed hyperlipidemia: Secondary | ICD-10-CM | POA: Diagnosis not present

## 2019-01-03 DIAGNOSIS — Z0000 Encounter for general adult medical examination without abnormal findings: Secondary | ICD-10-CM | POA: Diagnosis not present

## 2019-01-03 DIAGNOSIS — E782 Mixed hyperlipidemia: Secondary | ICD-10-CM | POA: Diagnosis not present

## 2019-01-03 DIAGNOSIS — E1159 Type 2 diabetes mellitus with other circulatory complications: Secondary | ICD-10-CM | POA: Diagnosis not present

## 2019-01-03 DIAGNOSIS — I10 Essential (primary) hypertension: Secondary | ICD-10-CM | POA: Diagnosis not present

## 2019-01-10 ENCOUNTER — Other Ambulatory Visit: Payer: Self-pay

## 2019-01-10 ENCOUNTER — Ambulatory Visit: Payer: 59 | Admitting: Physical Therapy

## 2019-01-10 DIAGNOSIS — Z171 Estrogen receptor negative status [ER-]: Principal | ICD-10-CM

## 2019-01-10 DIAGNOSIS — C50411 Malignant neoplasm of upper-outer quadrant of right female breast: Secondary | ICD-10-CM

## 2019-01-10 NOTE — Progress Notes (Signed)
Received call from cancer rehab pt regarding pt needing an order for referral. Pt having bilateral shoulder pain. Referral placed per request for pt.

## 2019-01-17 IMAGING — US US AXILLARY LEFT
1 series · 6 of 6 positions shown · non-contrast
Comparison: MRI July 2017

CLINICAL DATA: Recent bilateral mastectomies for right breast
cancer. Palpable lump on the left.

EXAM:
ULTRASOUND OF THE LEFT AXILLA

[Series 1: us axillary left · 0.06mm/px · 6 of 6 slices shown]
[im 1/6]
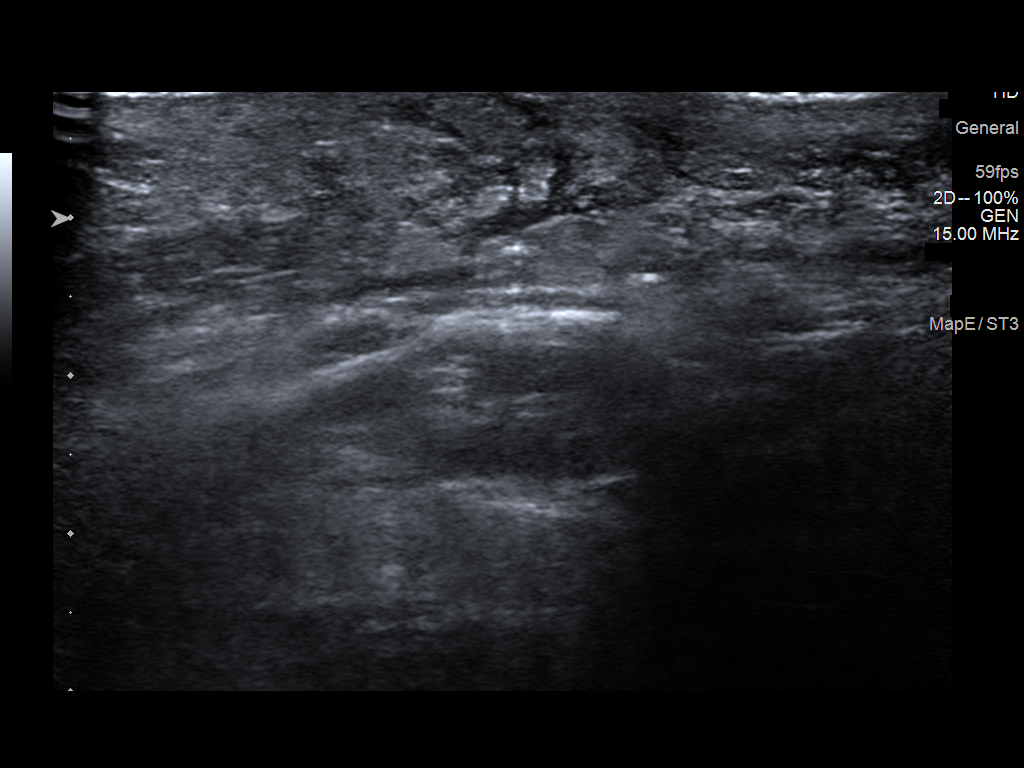
[im 2/6]
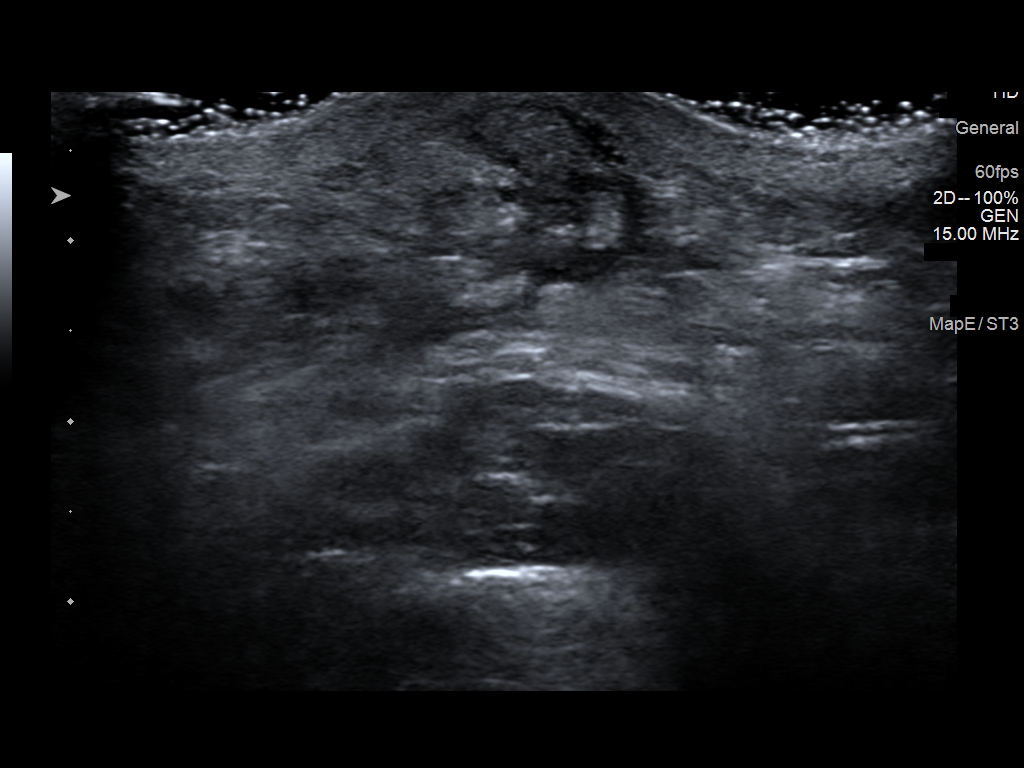
[im 3/6]
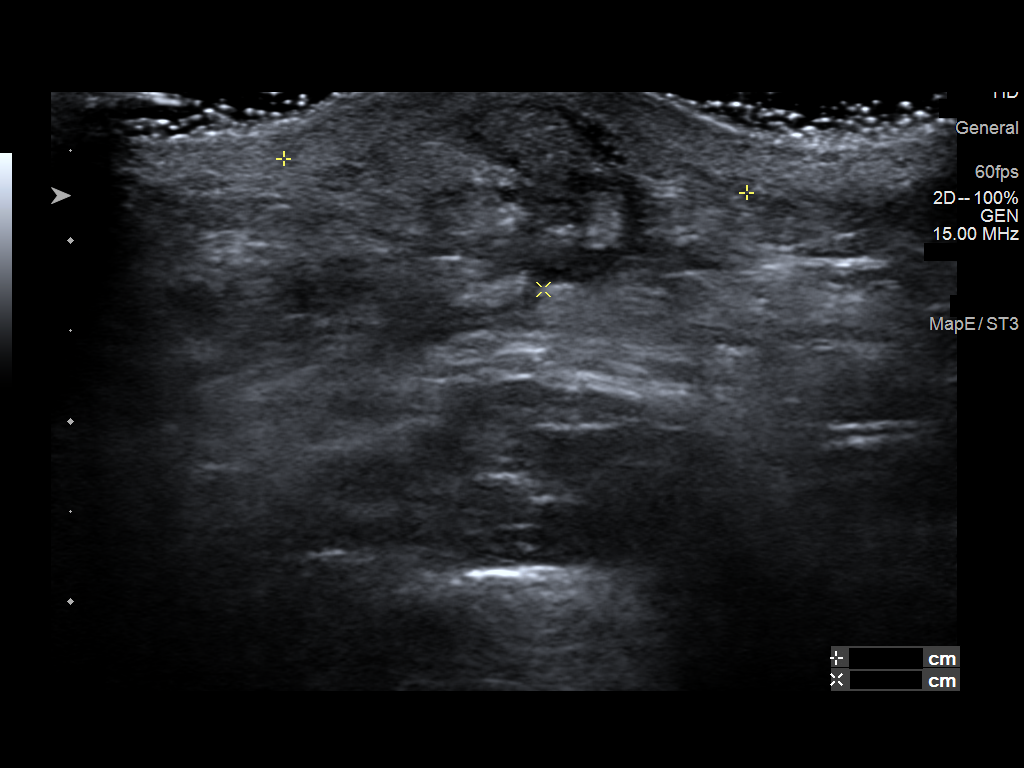
[im 4/6]
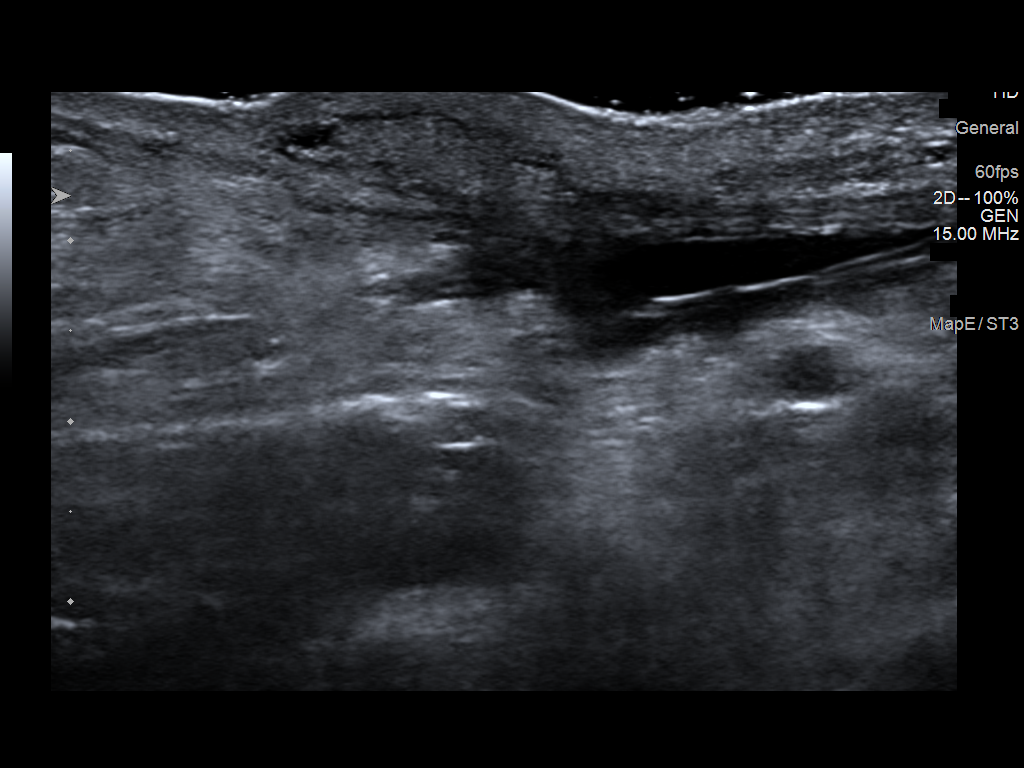
[im 5/6]
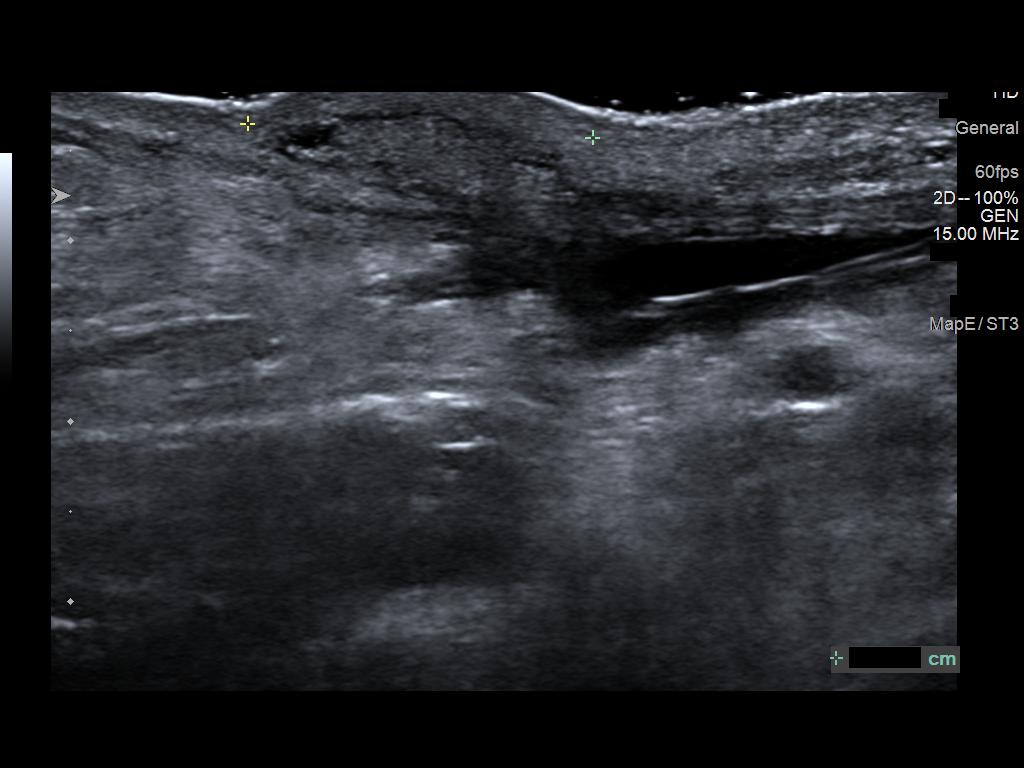
[im 6/6]
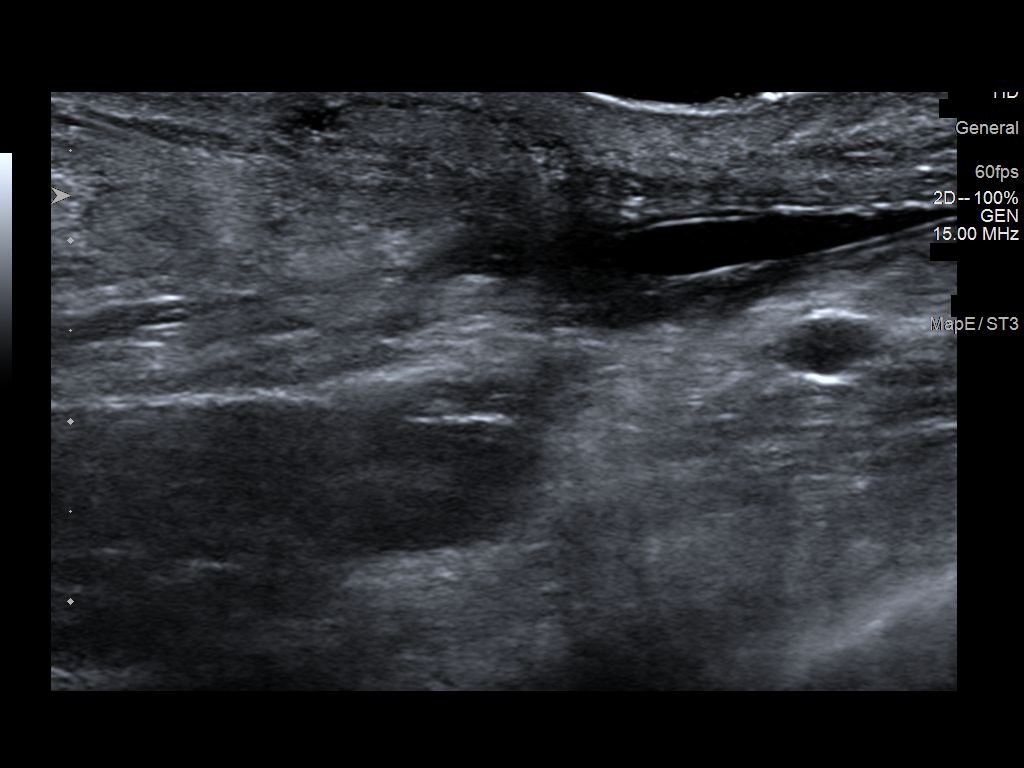

[6 of 6 positions shown; findings below may reference images not displayed]

FINDINGS: On physical exam,a firm palpable lump is noted just above the
patient's left-sided scar. She notes that the lump get smaller with
massage but then returns.

Ultrasound is performed. Just inferior to the lump is a seroma.
Fluid tracks from the seroma through the soft tissue superiorly.
There also appears to be fat necrosis superior to the seroma. The
patient appears to be feeling a combination of the fluid in fat
necrosis. No evidence of malignancy.
IMPRESSION: No sonographic evidence of malignancy. The patient appears to be
feeling fluid in the soft tissues and fat necrosis.

RECOMMENDATION:
Recommend continued clinical follow-up. If the symptoms worsen, the
patient can return. However, there is no evidence of malignancy on
this study.

I have discussed the findings and recommendations with the patient.
Results were also provided in writing at the conclusion of the
visit. If applicable, a reminder letter will be sent to the patient
regarding the next appointment.

BI-RADS CATEGORY  2: Benign.

## 2019-01-18 ENCOUNTER — Ambulatory Visit: Payer: 59 | Admitting: Physical Therapy

## 2019-01-29 ENCOUNTER — Telehealth: Payer: Self-pay | Admitting: Physical Therapy

## 2019-01-29 NOTE — Telephone Encounter (Signed)
Left a message for pt letting he know we received the referral to see her and would like to help out as best we can.  I will call back later this week. Maudry Diego, PT 01/29/2019 @ 3:15 PM

## 2019-01-31 ENCOUNTER — Ambulatory Visit: Payer: 59 | Admitting: Physical Therapy

## 2019-02-04 ENCOUNTER — Telehealth: Payer: Self-pay | Admitting: Physical Therapy

## 2019-02-04 NOTE — Telephone Encounter (Signed)
Called to assure that pt knows that our clinic is closed due to coronvirus distancing and her appointment is canceled. I also wanted  to offer any assist over the phone.  He voicemailbox is full and I was unable to leave a message.  Maudry Diego, PT 02/04/2019 @ 1:29 PM

## 2019-02-04 NOTE — Telephone Encounter (Signed)
Called pt to cancel her appointment for tomorrow since the clinic will be closed. Pt states that at this time her needs are not urgent. She did express that after she picked her mom up from a fall a few months back, she has been having shoulder pain. This is not the side of her breast cancer. She reports painful clicking in her shoulder. She has been doing stretches that were given to her for her other shoulder during physical therapy, but is still having trouble moving her shoulder out to the side and backwards. Educated pt that she may benefit from a referral to an orthopedic doctor. Pt reports that she will call her primary call doctor to obtain a referral. Will touch base with patient after her appointment to see if she needs to come in on an urgent basis, if she would benefit from telehealth, or if she would like to wait to reschedule when we reopen.   Kara Crawford, [place] 02/04/19 4:10 PM

## 2019-02-05 ENCOUNTER — Ambulatory Visit: Payer: 59 | Admitting: Physical Therapy

## 2019-02-12 ENCOUNTER — Encounter: Payer: Self-pay | Admitting: Rehabilitation

## 2019-02-12 ENCOUNTER — Ambulatory Visit: Payer: 59 | Attending: Hematology and Oncology | Admitting: Rehabilitation

## 2019-02-12 ENCOUNTER — Other Ambulatory Visit: Payer: Self-pay

## 2019-02-12 DIAGNOSIS — G8929 Other chronic pain: Secondary | ICD-10-CM

## 2019-02-12 DIAGNOSIS — R293 Abnormal posture: Secondary | ICD-10-CM

## 2019-02-12 DIAGNOSIS — M6281 Muscle weakness (generalized): Secondary | ICD-10-CM

## 2019-02-12 DIAGNOSIS — M25512 Pain in left shoulder: Secondary | ICD-10-CM | POA: Insufficient documentation

## 2019-02-12 NOTE — Patient Instructions (Signed)
reviewed best current home exercises: doorway stretch, wall chest stretch, scapular retraction, added postural taping instruction, foam roll stretches and reviewed posture with reaching, will do band exercsies supine instead of standing to decrease postural limitations. Pt will perform at home and PT will call to check in in 2 weeks for televisit vs just phone call

## 2019-02-12 NOTE — Therapy (Addendum)
[place], [place], [address] Phone: [phone]   Fax:  [phone]  Physical Therapy Evaluation  Patient Details  Name: Kara Crawford MRN: [id number] Date of Birth: Jan 07, 1966 Referring Provider (PT): Dr. Lindi Adie   Encounter Date: 02/12/2019  PT End of Session - 02/12/19 0906    Visit Number  1    Number of Visits  7    Date for PT Re-Evaluation  03/26/19    Authorization Type  [employer] Medicare    PT Start Time  0900    PT Stop Time  0955    PT Time Calculation (min)  55 min    Activity Tolerance  Patient tolerated treatment well    Behavior During Therapy  [facility] for tasks assessed/performed       Past Medical History:  Diagnosis Date  . Anemia    anemia prior to uterine emboliztion procedure.  . Breast cancer ([place])   . Diabetes mellitus without complication ([place])    type 2  . Family history of breast cancer   . Family history of ovarian cancer   . Family history of prostate cancer   . Fibroids   . Headache   . Heart murmur   . History of radiation therapy 03/07/18- 04/18/18   Right Chest wall 50 Gy in 25 fractions, Right supraclavicular fossa 50 Gy in 25 fractions, Right Chest wall scar boost 10 Gy in 5 fractions.   . Hyperlipidemia   . Hypertension     Past Surgical History:  Procedure Laterality Date  . BREAST SURGERY     double mastectomy  . BREATH TEK H PYLORI N/A 04/17/2014   Procedure: BREATH TEK H PYLORI;  Surgeon: Shann Medal, MD;  Location: [facility];  Service: General;  Laterality: N/A;  . CESAREAN SECTION     x3- normal development  . CYSTOSCOPY N/A 07/18/2018   Procedure: CYSTOSCOPY;  Surgeon: Christophe Louis, MD;  Location: [facility];  Service: Gynecology;  Laterality: N/A;  . ESOPHAGOGASTRODUODENOSCOPY N/A 08/20/2015   Procedure: ESOPHAGOGASTRODUODENOSCOPY (EGD);  Surgeon: Alphonsa Overall, MD;  Location: [facility];  Service: General;  Laterality: N/A;  . EXPLORATORY LAPAROTOMY   05/13/2015  . GASTRIC ROUX-EN-Y N/A 10/06/2014   Procedure: LAPAROSCOPIC ROUX-EN-Y GASTRIC BYPASS WITH UPPER ENDOSCOPY;  Surgeon: Pedro Earls, MD;  Location: [facility];  Service: General;  Laterality: N/A;  . LAPAROSCOPIC SALPINGO OOPHERECTOMY Bilateral 07/18/2018   Procedure: LAPAROSCOPIC SALPINGO OOPHORECTOMY;  Surgeon: Christophe Louis, MD;  Location: [facility];  Service: Gynecology;  Laterality: Bilateral;  possible laparotomy with abdominal bilateral salpingoophorectomy  . LAPAROSCOPY N/A 05/12/2015   Procedure: LAPAROSCOPY REPAIR OF PERFORATED BOWEL;  Surgeon: Alphonsa Overall, MD;  Location: [facility];  Service: General;  Laterality: N/A;  . MASTECTOMY WITH RADIOACTIVE SEED GUIDED EXCISION AND AXILLARY SENTINEL LYMPH NODE BIOPSY Bilateral 01/22/2018   Procedure: BILATERAL MASTECTOMIES WITH RADIOACTIVE SEED TARGETED RIGHT AXILLARY [place] NODE EXCISION AND RIGHT SENTINEL LYMPH NODE BIOPSY;  Surgeon: Alphonsa Overall, MD;  Location: [facility];  Service: General;  Laterality: Bilateral;  . PORT-A-CATH REMOVAL Left 01/22/2018   Procedure: REMOVAL PORT-A-CATH;  Surgeon: Alphonsa Overall, MD;  Location: [facility];  Service: General;  Laterality: Left;  . PORTACATH PLACEMENT N/A 08/02/2017   Procedure: INSERTION PORT-A-CATH;  Surgeon: Alphonsa Overall, MD;  Location: [facility];  Service: General;  Laterality: N/A;  . UTERINE ARTERY EMBOLIZATION  08-16-2010    There were no vitals filed for this visit.   Subjective Assessment -  02/12/19 0903    Subjective  i think the left shoulder is hurting from using it alot due to my Lt UE lymphedema.  My left arm is now swelling more in the foerarm. I had not been doing my MLD and compression about 1 month ago I started again and it has been helping.      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 It measures 1 cm and is located in the upper outer quadrant with a Ki67 of 90%. She had 3 abnormal  looking axillary nodes on ultrasound and 1 was biopsied and found to be positive, 01/22/18- underwent bilateral mastectomy and a SLNB on the right side with 6/6 were negative, pt has completed chemotherapy and radiation.  Has been seen here previously for lymphedema and shoulder ROM.      Limitations  Lifting    Patient Stated Goals  see what to do for shoulder pain    Currently in Pain?  Yes   only with certain movements   Pain Score  10-Worst pain ever    Pain Location  Shoulder    Pain Orientation  Left    Pain Descriptors / Indicators  Sharp    Pain Type  Chronic pain    Pain Radiating Towards  denies    Pain Onset  More than a month ago    Pain Frequency  Intermittent    Aggravating Factors   any movement    Pain Relieving Factors  rest, Tylenol         [facility] PT Assessment - 02/12/19 0001      Assessment   Medical Diagnosis  shoulder pain    Referring Provider (PT)  Dr. Lindi Adie    Hand Dominance  Right      Precautions   Precaution Comments  cancer history, lymphedema      Restrictions   Weight Bearing Restrictions  No      Balance Screen   Has the patient fallen in the past 6 months  No    Has the patient had a decrease in activity level because of a fear of falling?   No    Is the patient reluctant to leave their home because of a fear of falling?   No      Home Environment   Living Environment  Private residence    Living Arrangements  Spouse/significant other    Available Help at Discharge  Family    Type of [place]      Prior Function   Level of [place]  Unemployed      Cognition   Overall Cognitive Status  Within Functional Limits for tasks assessed      Observation/Other Assessments   Observations  some increased swelling Rt forearm. Pt feels comfortable with current lymphedema HEP just needs to get back on it      Sensation   Light Touch  Appears Intact      Coordination   Gross Motor Movements are Fluid and  Coordinated  Yes      Posture/Postural Control   Posture/Postural Control  Postural limitations    Postural Limitations  Rounded Shoulders;Forward head    Posture Comments  Rt shoulder hiking with abduction. no change in abduction with postural correction manually      ROM / Strength   AROM / PROM / Strength  AROM;PROM;Strength  AROM   Overall AROM Comments  thoracic rotation bilateral WNL    AROM Assessment Site  Shoulder    Right/Left Shoulder  Right;Left    Right Shoulder Flexion  140 Degrees    Right Shoulder ABduction  145 Degrees   with flexion majority   Left Shoulder Flexion  136 Degrees   pn   Left Shoulder ABduction  85 Degrees   pn   Left Shoulder Internal Rotation  --   behind the back only to the thighs Bil   Left Shoulder External Rotation  --   behind the head 70% with pain   Left Shoulder Horizontal ABduction  0 Degrees      PROM   PROM Assessment Site  Shoulder    Right/Left Shoulder  Left    Left Shoulder Flexion  140 Degrees    Left Shoulder ABduction  90 Degrees    Left Shoulder Internal Rotation  --   to belly   Left Shoulder External Rotation  45 Degrees   at 45 deg abd     Strength   Strength Assessment Site  Shoulder    Right/Left Shoulder  Right;Left    Right Shoulder Flexion  4+/5    Right Shoulder ABduction  4+/5    Right Shoulder Internal Rotation  4+/5    Right Shoulder External Rotation  4+/5    Left Shoulder Flexion  4-/5    Left Shoulder ABduction  4/5    Left Shoulder Internal Rotation  4/5    Left Shoulder External Rotation  4/5      Palpation   Palpation comment  no ttp RTC tendons             Quick Dash - 02/12/19 0001    Open a tight or new jar  No difficulty    Do heavy household chores (wash walls, wash floors)  Moderate difficulty    Carry a shopping bag or briefcase  No difficulty    Wash your back  Severe difficulty    Use a knife to cut food  No difficulty    Recreational activities in which you take some  force or impact through your arm, shoulder, or hand (golf, hammering, tennis)  Severe difficulty    During the past week, to what extent has your arm, shoulder or hand problem interfered with your normal social activities with family, friends, neighbors, or groups?  Quite a bit    During the past week, to what extent has your arm, shoulder or hand problem limited your work or other regular daily activities  Quite a bit    Arm, shoulder, or hand pain.  Severe    Tingling (pins and needles) in your arm, shoulder, or hand  None    Difficulty Sleeping  Moderate difficulty    DASH Score  43.18 %        Objective measurements completed on examination: See above findings.      [place] Adult PT Treatment/Exercise - 02/12/19 0001      Exercises   Exercises  Other Exercises    Other Exercises   reviewed best current home exercises: doorway stretch, wall chest stretch, scapular retraction, added postural taping instruction, foam roll stretches and reviewed posture with reaching, will do band exercsies supine instead of standing to decrease postural limitations. Pt will perform at home and PT will call to check in in 2 weeks for televisit vs just phone call  PT Education - 02/12/19 1059    Education Details  update HEP    Person(s) Educated  Patient    Methods  Explanation    Comprehension  Verbalized understanding;Returned demonstration;Verbal cues required          PT Long Term Goals - 02/12/19 1342      PT LONG TERM GOAL #1   Title  Pt will decrease Lt shoulder pain to 3/10 at the worst with movement    Baseline  10/10    Status  New      PT LONG TERM GOAL #2   Title  Pt will tolerate supine foam roll position pectoralis stretch without increased pain or spasm in the shoulder    Time  4    Period  Weeks    Status  New      PT LONG TERM GOAL #3   Title  Pt will be ind with continued shoulder strengthening     Status  New      PT LONG TERM GOAL #4   Title  Pt  will improve lt shoulder Abduction to at least 140 degrees without hiking     Status  New             Plan - 02/12/19 1335    Clinical Impression Statement  Pt returns to PT here with complaints of bilateral but new Left shoulder pain since around december of last year.  Pt reporting it is most likely due to overuse of the Lt UE due to lymphedema in the Rt UE.  Pt demonstrates overall painful and decreased AROM = to PROM measures, overall Lt shoulder weakness and pain with MMT and poor shoulder postural with rounded shoulders, chest and incision tightess due to surgery and radiation.  No ttp RTC tendons.  Frozen shoulder vs impingment but most likely impingement.  Pt currentl has a HEP from therapy here and it was modified to focus on: doorway stretch, wall chest stretch, scapular retraction, added postural taping instruction, foam roll stretches and reviewed posture with reaching, will do band exercsies supine instead of standing to decrease postural limitations. Pt will perform at home and PT will call to check in in 2 weeks for televisit vs just phone call . Advised that joint injection may be helpful if pain continues.      Personal Factors and Comorbidities  Time since onset of injury/illness/exacerbation;Comorbidity 2    Comorbidities  radiation history, scar tissue present    Examination-Activity Limitations  Lift;Reach Overhead;Carry    Examination-Participation Restrictions  Cleaning;Yard Work    Stability/Clinical Decision Making  Stable/Uncomplicated    Designer, jewellery  Low    Rehab Potential  Good    PT Frequency  1x / week    PT Duration  6 weeks    PT Treatment/Interventions  ADLs/Self Care Home Management;Therapeutic exercise;Neuromuscular re-education;Patient/family education;Manual techniques;Taping    PT Next Visit Plan  follow0up phone call with patient to see additional needs; if return focus on Lt shoulder and scapular STM, ROM, impingement type exercises, foam  roller    PT Home Exercise Plan  doorway stretch, wall chest stretch, scapular retraction, added postural taping instruction, foam roll stretches and reviewed posture with reaching, will do band exercsies supine instead of standing to decrease postural limitations.     Consulted and Agree with Plan of Care  Patient       Patient will benefit from skilled therapeutic intervention in order to improve the following deficits and impairments:  Decreased range of motion, Decreased scar mobility, Decreased strength, Pain, Postural dysfunction  Visit Diagnosis: Abnormal posture  Muscle weakness (generalized)  Chronic left shoulder pain     Problem List Patient Active Problem List   Diagnosis Date Noted  . Cancer of right breast, stage 2 ([place]) 01/22/2018  . Genetic testing 08/18/2017  . BRCA2 positive 08/18/2017  . Family history of breast cancer   . Family history of ovarian cancer   . Family history of prostate cancer   . Malignant neoplasm of upper-outer quadrant of right breast in female, estrogen receptor negative ([place]) 07/25/2017  . Perforated bowel ([place]) 05/13/2015  . Free intraperitoneal air 05/12/2015  . Submucosal lesion of esophagus 10/06/2014  . 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 Diabetes ([place]) 09/24/2014  . Morbid obesity ([place]) 03/20/2014    Shan Levans, PT 02/12/2019, 1:44 PM  [place], [place], [address] Phone: [phone]   Fax:  [phone]  Name: Kara Crawford MRN: [id number] Date of Birth: 04-Apr-1966  PHYSICAL THERAPY DISCHARGE SUMMARY  Visits from Start of Care: 1  Current functional level related to goals / functional outcomes: See above   Remaining deficits: Pt returned in July with new order, shoulder pain had improved but pt has increased swelling. Will be seeing pt under new order and d/c this POC>.  Education /  Equipment: HEP  Plan: Patient agrees to discharge.  Patient goals were not met. Patient is being discharged due to not returning since the last visit.  ?????     Allyson Sabal [place], [place] 05/23/19 12:24 PM

## 2019-02-28 ENCOUNTER — Telehealth: Payer: Self-pay | Admitting: Rehabilitation

## 2019-02-28 NOTE — Telephone Encounter (Signed)
LM for patient regarding PT visit follow up and left instruction to call PT back or clinic for visits/follow up if needed

## 2019-05-23 ENCOUNTER — Other Ambulatory Visit: Payer: Self-pay

## 2019-05-23 ENCOUNTER — Ambulatory Visit: Payer: 59 | Attending: Family Medicine | Admitting: Physical Therapy

## 2019-05-23 ENCOUNTER — Encounter: Payer: Self-pay | Admitting: Physical Therapy

## 2019-05-23 DIAGNOSIS — I972 Postmastectomy lymphedema syndrome: Secondary | ICD-10-CM | POA: Diagnosis present

## 2019-05-23 DIAGNOSIS — M25612 Stiffness of left shoulder, not elsewhere classified: Secondary | ICD-10-CM | POA: Diagnosis present

## 2019-05-23 DIAGNOSIS — L599 Disorder of the skin and subcutaneous tissue related to radiation, unspecified: Secondary | ICD-10-CM

## 2019-05-23 DIAGNOSIS — M6281 Muscle weakness (generalized): Secondary | ICD-10-CM

## 2019-05-23 DIAGNOSIS — M25611 Stiffness of right shoulder, not elsewhere classified: Secondary | ICD-10-CM

## 2019-05-23 NOTE — Therapy (Signed)
[place], [place], [address] Phone: [phone]   Fax:  [phone]  Physical Therapy Evaluation  Patient Details  Name: Kara Crawford MRN: [id number] Date of Birth: December 11, 1965 Referring Provider (PT): Dr. Theadore Nan   Encounter Date: 05/23/2019  PT End of Session - 05/23/19 0917    Visit Number  1    Number of Visits  13    Date for PT Re-Evaluation  07/04/19    PT Start Time  0834    PT Stop Time  0915    PT Time Calculation (min)  41 min    Activity Tolerance  Patient tolerated treatment well    Behavior During Therapy  [facility] for tasks assessed/performed       Past Medical History:  Diagnosis Date  . Anemia    anemia prior to uterine emboliztion procedure.  . Breast cancer ([place])   . Diabetes mellitus without complication ([place])    type 2  . Family history of breast cancer   . Family history of ovarian cancer   . Family history of prostate cancer   . Fibroids   . Headache   . Heart murmur   . History of radiation therapy 03/07/18- 04/18/18   Right Chest wall 50 Gy in 25 fractions, Right supraclavicular fossa 50 Gy in 25 fractions, Right Chest wall scar boost 10 Gy in 5 fractions.   . Hyperlipidemia   . Hypertension     Past Surgical History:  Procedure Laterality Date  . BREAST SURGERY     double mastectomy  . BREATH TEK H PYLORI N/A 04/17/2014   Procedure: BREATH TEK H PYLORI;  Surgeon: Shann Medal, MD;  Location: [facility];  Service: General;  Laterality: N/A;  . CESAREAN SECTION     x3- normal development  . CYSTOSCOPY N/A 07/18/2018   Procedure: CYSTOSCOPY;  Surgeon: Christophe Louis, MD;  Location: [facility];  Service: Gynecology;  Laterality: N/A;  . ESOPHAGOGASTRODUODENOSCOPY N/A 08/20/2015   Procedure: ESOPHAGOGASTRODUODENOSCOPY (EGD);  Surgeon: Alphonsa Overall, MD;  Location: [facility];  Service: General;  Laterality: N/A;  . EXPLORATORY LAPAROTOMY  05/13/2015  . GASTRIC  ROUX-EN-Y N/A 10/06/2014   Procedure: LAPAROSCOPIC ROUX-EN-Y GASTRIC BYPASS WITH UPPER ENDOSCOPY;  Surgeon: Pedro Earls, MD;  Location: [facility];  Service: General;  Laterality: N/A;  . LAPAROSCOPIC SALPINGO OOPHERECTOMY Bilateral 07/18/2018   Procedure: LAPAROSCOPIC SALPINGO OOPHORECTOMY;  Surgeon: Christophe Louis, MD;  Location: [facility];  Service: Gynecology;  Laterality: Bilateral;  possible laparotomy with abdominal bilateral salpingoophorectomy  . LAPAROSCOPY N/A 05/12/2015   Procedure: LAPAROSCOPY REPAIR OF PERFORATED BOWEL;  Surgeon: Alphonsa Overall, MD;  Location: [facility];  Service: General;  Laterality: N/A;  . MASTECTOMY WITH RADIOACTIVE SEED GUIDED EXCISION AND AXILLARY SENTINEL LYMPH NODE BIOPSY Bilateral 01/22/2018   Procedure: BILATERAL MASTECTOMIES WITH RADIOACTIVE SEED TARGETED RIGHT AXILLARY [place] NODE EXCISION AND RIGHT SENTINEL LYMPH NODE BIOPSY;  Surgeon: Alphonsa Overall, MD;  Location: [facility];  Service: General;  Laterality: Bilateral;  . PORT-A-CATH REMOVAL Left 01/22/2018   Procedure: REMOVAL PORT-A-CATH;  Surgeon: Alphonsa Overall, MD;  Location: [facility];  Service: General;  Laterality: Left;  . PORTACATH PLACEMENT N/A 08/02/2017   Procedure: INSERTION PORT-A-CATH;  Surgeon: Alphonsa Overall, MD;  Location: [facility];  Service: General;  Laterality: N/A;  . UTERINE ARTERY EMBOLIZATION  08-16-2010    There were no vitals filed for this visit.   Subjective Assessment - 05/23/19 0840    Subjective  My shoulders had been really bothering me but for the last month I started back on my exercises and stretches and they are much better. I am still having tightness in the right armpit. I also am having more swelling in my R arm. I had ordered a sleeve offline. I lost the sleeve. Then my brother gave me a sleeve I was using. I had an appointment to get measured for a sleeve in March but it was cancelled due to Covid.    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 It measures 1 cm and is located in the upper outer quadrant with a Ki67 of 90%. She had 3 abnormal looking axillary nodes on ultrasound and 1 was biopsied and found to be positive, 01/22/18- underwent bilateral mastectomy and a SLNB on the right side with 6/6 were negative, pt has completed chemotherapy and radiation.  Has been seen here previously for lymphedema and shoulder ROM.      Patient Stated Goals  to get the swelling down    Currently in Pain?  No/denies    Pain Score  0-No pain         [facility] PT Assessment - 05/23/19 0001      Assessment   Medical Diagnosis  R breast cancer    Referring Provider (PT)  Dr. Theadore Nan    Onset Date/Surgical Date  01/22/18    Hand Dominance  Right    Prior Therapy  therapy at this clinic last year for shoulder ROM      Precautions   Precautions  Other (comment)    Precaution Comments  at risk for lymphedema      Restrictions   Weight Bearing Restrictions  No      Balance Screen   Has the patient fallen in the past 6 months  No    Has the patient had a decrease in activity level because of a fear of falling?   No    Is the patient reluctant to leave their home because of a fear of falling?   No      Home Environment   Living Environment  Private residence    Living Arrangements  Spouse/significant other    Available Help at Discharge  Family    Type of [place]      Prior Function   Level of Independence  Independent    Vocation  Unemployed      Cognition   Overall Cognitive Status  Within Functional Limits for tasks assessed      ROM / Strength   AROM / PROM / Strength  AROM      AROM   Right Shoulder Flexion  147 Degrees    Right Shoulder ABduction  153 Degrees    Right Shoulder Internal Rotation  38 Degrees    Right Shoulder External Rotation  75 Degrees    Left Shoulder Flexion  151 Degrees    Left Shoulder ABduction  164 Degrees    Left Shoulder Internal  Rotation  44 Degrees   with shoulder hiking   Left Shoulder External Rotation  75 Degrees        LYMPHEDEMA/ONCOLOGY QUESTIONNAIRE - 05/23/19 0854      Type   Cancer Type  Right breast cancer      Surgeries   Mastectomy Date  01/22/18    Sentinel Lymph Node Biopsy Date  01/22/18    Number Lymph  Nodes Removed  6      Date Lymphedema/Swelling Started   Date  08/31/18      Treatment   Active Chemotherapy Treatment  No    Past Chemotherapy Treatment  Yes    Active Radiation Treatment  No    Past Radiation Treatment  Yes    Current Hormone Treatment  No    Past Hormone Therapy  No      What other symptoms do you have   Are you Having Heaviness or Tightness  Yes    Are you having Pain  Yes    Are you having pitting edema  No    Is it Hard or Difficult finding clothes that fit  No    Do you have infections  No    Is there Decreased scar mobility  Yes   R side      Lymphedema Assessments   Lymphedema Assessments  Upper extremities      Right Upper Extremity Lymphedema   15 cm Proximal to Olecranon Process  35 cm    Olecranon Process  29.3 cm    15 cm Proximal to Ulnar Styloid Process  31.5 cm    Just Proximal to Ulnar Styloid Process  19.5 cm    Across Hand at [employer]  20.5 cm    At [place] of 2nd Digit  6.8 cm      Left Upper Extremity Lymphedema   15 cm Proximal to Olecranon Process  33.2 cm    Olecranon Process  26.3 cm    15 cm Proximal to Ulnar Styloid Process  26.3 cm    Just Proximal to Ulnar Styloid Process  17 cm    Across Hand at [employer]  19 cm    At [place] of 2nd Digit  6.4 cm          Quick Dash - 05/23/19 0001    Open a tight or new jar  Mild difficulty    Do heavy household chores (wash walls, wash floors)  Mild difficulty    Carry a shopping bag or briefcase  No difficulty    Wash your back  No difficulty    Use a knife to cut food  No difficulty    Recreational activities in which you take some force or impact through your arm,  shoulder, or hand (golf, hammering, tennis)  No difficulty    During the past week, to what extent has your arm, shoulder or hand problem interfered with your normal social activities with family, friends, neighbors, or groups?  Not at all    During the past week, to what extent has your arm, shoulder or hand problem limited your work or other regular daily activities  Not at all    Arm, shoulder, or hand pain.  None    Tingling (pins and needles) in your arm, shoulder, or hand  None    Difficulty Sleeping  Mild difficulty    DASH Score  6.82 %        Outpatient Re[facility]  Lymphedema Life Impact Scale Total Score  5.88 %      Objective measurements completed on examination: See above findings.              PT Education - 05/23/19 0926    Education Details  compression bandaging, anatomy and physiology of lymphatic system, risk factors for lymphedema    Person(s) Educated  Patient  Methods  Explanation    Comprehension  Verbalized understanding          PT Long Term Goals - 05/23/19 0923      PT LONG TERM GOAL #1   Title  Pt will be able to internally and externally rotate L shoulder without hiking left shoulder    Time  4    Period  Weeks    Status  New    Target Date  07/04/19      PT LONG TERM GOAL #2   Title  Pt will be independent in self MLD for RUE to allow long term management of lymphedema    Time  4    Period  Weeks    Status  New    Target Date  07/04/19      PT LONG TERM GOAL #3   Title  Pt will report a 75% improvement in tightness in R axilla and pec with R overhead motion to allow improved comfort    Time  4    Period  Weeks    Status  New    Target Date  07/04/19      PT LONG TERM GOAL #4   Title  Pt will obtain appropriate compression garments for long term management of lymphedema    Time  4    Period  Weeks    Status  New    Target Date  07/04/19      PT LONG TERM GOAL  #5   Title  Pt will be independent in a home exercise program for continued strengthening and stretching    Time  4    Period  Weeks    Status  New    Target Date  07/04/19             Plan - 05/23/19 0917    Clinical Impression Statement  Pt presents to PT with RUE lymphedema, tightness in R axilla and decreased ROM in bilateral shoulders with L IR and ER worse than right. Pt demonstrates shoulder hiking with L IR and ER. Pt was seen in April for an eval and at the time had severely limited left shoulder ROM. She has been doing her stretches and exercies for the past 6 weeks and it has improved greatly. She underwent bilateral mastectomies in March of 2019 and completed chemo and radiation for treatment of R breast cancer. Pt would benefit from skilled PT services to address bilateral shoulder ROM, R UE lymphedema, and R axillary tightness. Currently pt does not have a sleeve that was measured for her and has not been instructed in MLD for R UE. She would benefit from compression bandaging to reduce edema prior to being measured for a sleeve.    Personal Factors and Comorbidities  Time since onset of injury/illness/exacerbation;Comorbidity 2    Comorbidities  radiation history, scar tissue present    Examination-Activity Limitations  Lift;Reach Overhead;Carry    Examination-Participation Restrictions  Cleaning;Yard Work    Stability/Clinical Decision Making  Stable/Uncomplicated    Designer, jewellery  Low    Rehab Potential  Good    PT Frequency  3x / week    PT Duration  6 weeks    PT Treatment/Interventions  ADLs/Self Care Home Management;Therapeutic exercise;Patient/family education;Manual techniques;Taping;Manual lymph drainage;Scar mobilization;Compression bandaging;Passive range of motion;Vasopneumatic Device;Joint Manipulations    PT Next Visit Plan  begin MLD to RUE and instruct/issue handout, begin compression bandaging and at some pt teach a family memeber to bandage to  decrease to 2x/wk, PROM to bilateral shoulders, scap mobs to L, scar massage and myofascial to R pec    Consulted and Agree with Plan of Care  Patient       Patient will benefit from skilled therapeutic intervention in order to improve the following deficits and impairments:  Decreased range of motion, Decreased scar mobility, Decreased strength, Pain, Postural dysfunction, Increased edema  Visit Diagnosis: 1. Postmastectomy lymphedema   2. Stiffness of left shoulder, not elsewhere classified   3. Stiffness of right shoulder, not elsewhere classified   4. Disorder of the skin and subcutaneous tissue related to radiation, unspecified   5. Muscle weakness (generalized)        Problem List Patient Active Problem List   Diagnosis Date Noted  . Cancer of right breast, stage 2 ([place]) 01/22/2018  . Genetic testing 08/18/2017  . BRCA2 positive 08/18/2017  . Family history of breast cancer   . Family history of ovarian cancer   . Family history of prostate cancer   . Malignant neoplasm of upper-outer quadrant of right breast in female, estrogen receptor negative ([place]) 07/25/2017  . Perforated bowel ([place]) 05/13/2015  . Free intraperitoneal air 05/12/2015  . Submucosal lesion of esophagus 10/06/2014  . 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 Diabetes ([place]) 09/24/2014  . Morbid obesity ([place]) 03/20/2014    Allyson Sabal [facility] 05/23/2019, 9:28 AM  [place], [place], [address] Phone: [phone]   Fax:  [phone]  Name: Kara Crawford MRN: [id number] Date of Birth: [birth year]  Manus Gunning, PT 05/23/19 9:28 AM

## 2019-05-24 ENCOUNTER — Encounter: Payer: Self-pay | Admitting: Physical Therapy

## 2019-05-24 ENCOUNTER — Other Ambulatory Visit: Payer: Self-pay

## 2019-05-24 ENCOUNTER — Ambulatory Visit: Payer: 59 | Admitting: Physical Therapy

## 2019-05-24 DIAGNOSIS — I972 Postmastectomy lymphedema syndrome: Secondary | ICD-10-CM | POA: Diagnosis not present

## 2019-05-24 NOTE — Therapy (Signed)
[place], [place], [address] Phone: [phone]   Fax:  [phone]  Physical Therapy Treatment  Patient Details  Name: Kara Crawford MRN: [id number] Date of Birth: 1966/04/24 Referring Provider (PT): Dr. Theadore Nan   Encounter Date: 05/24/2019  PT End of Session - 05/24/19 1221    Visit Number  2    Number of Visits  13    Date for PT Re-Evaluation  07/04/19    PT Start Time  1134    PT Stop Time  1215    PT Time Calculation (min)  41 min    Activity Tolerance  Patient tolerated treatment well    Behavior During Therapy  [facility] for tasks assessed/performed       Past Medical History:  Diagnosis Date  . Anemia    anemia prior to uterine emboliztion procedure.  . Breast cancer ([place])   . Diabetes mellitus without complication ([place])    type 2  . Family history of breast cancer   . Family history of ovarian cancer   . Family history of prostate cancer   . Fibroids   . Headache   . Heart murmur   . History of radiation therapy 03/07/18- 04/18/18   Right Chest wall 50 Gy in 25 fractions, Right supraclavicular fossa 50 Gy in 25 fractions, Right Chest wall scar boost 10 Gy in 5 fractions.   . Hyperlipidemia   . Hypertension     Past Surgical History:  Procedure Laterality Date  . BREAST SURGERY     double mastectomy  . BREATH TEK H PYLORI N/A 04/17/2014   Procedure: BREATH TEK H PYLORI;  Surgeon: Shann Medal, MD;  Location: [facility];  Service: General;  Laterality: N/A;  . CESAREAN SECTION     x3- normal development  . CYSTOSCOPY N/A 07/18/2018   Procedure: CYSTOSCOPY;  Surgeon: Christophe Louis, MD;  Location: [facility];  Service: Gynecology;  Laterality: N/A;  . ESOPHAGOGASTRODUODENOSCOPY N/A 08/20/2015   Procedure: ESOPHAGOGASTRODUODENOSCOPY (EGD);  Surgeon: Alphonsa Overall, MD;  Location: [facility];  Service: General;  Laterality: N/A;  . EXPLORATORY LAPAROTOMY  05/13/2015  . GASTRIC  ROUX-EN-Y N/A 10/06/2014   Procedure: LAPAROSCOPIC ROUX-EN-Y GASTRIC BYPASS WITH UPPER ENDOSCOPY;  Surgeon: Pedro Earls, MD;  Location[facility];  Service: General;  Laterality: N/A;  . LAPAROSCOPIC SALPINGO OOPHERECTOMY Bilateral 07/18/2018   Procedure: LAPAROSCOPIC SALPINGO OOPHORECTOMY;  Surgeon: Christophe Louis, MD;  Location: [facility];  Service: Gynecology;  Laterality: Bilateral;  possible laparotomy with abdominal bilateral salpingoophorectomy  . LAPAROSCOPY N/A 05/12/2015   Procedure: LAPAROSCOPY REPAIR OF PERFORATED BOWEL;  Surgeon: Alphonsa Overall, MD;  Location: [facility];  Service: General;  Laterality: N/A;  . MASTECTOMY WITH RADIOACTIVE SEED GUIDED EXCISION AND AXILLARY SENTINEL LYMPH NODE BIOPSY Bilateral 01/22/2018   Procedure: BILATERAL MASTECTOMIES WITH RADIOACTIVE SEED TARGETED RIGHT AXILLARY [place] NODE EXCISION AND RIGHT SENTINEL LYMPH NODE BIOPSY;  Surgeon: Alphonsa Overall, MD;  Location: [facility];  Service: General;  Laterality: Bilateral;  . PORT-A-CATH REMOVAL Left 01/22/2018   Procedure: REMOVAL PORT-A-CATH;  Surgeon: Alphonsa Overall, MD;  Location: [facility];  Service: General;  Laterality: Left;  . PORTACATH PLACEMENT N/A 08/02/2017   Procedure: INSERTION PORT-A-CATH;  Surgeon: Alphonsa Overall, MD;  Location: [facility];  Service: General;  Laterality: N/A;  . UTERINE ARTERY EMBOLIZATION  08-16-2010    There were no vitals filed for this visit.  Subjective Assessment - 05/24/19 1136    Subjective  My  shoulder feels good today. I did my stretches this morning.    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 It measures 1 cm and is located in the upper outer quadrant with a Ki67 of 90%. She had 3 abnormal looking axillary nodes on ultrasound and 1 was biopsied and found to be positive, 01/22/18- underwent bilateral mastectomy and a SLNB on the right side with 6/6 were negative, pt has completed chemotherapy  and radiation.  Has been seen here previously for lymphedema and shoulder ROM.      Patient Stated Goals  to get the swelling down    Currently in Pain?  No/denies    Pain Score  0-No pain                  Outpatient Re[facility] from [facility]  Lymphedema Life Impact Scale Total Score  5.88 %           [facility] Adult PT Treatment/Exercise - 05/24/19 0001      Manual Therapy   Manual Therapy  Manual Lymphatic Drainage (MLD);Compression Bandaging    Manual Lymphatic Drainage (MLD)  short neck, superficial and deep abdominals, left axillary nodes and establishment of interaxillary pathway, right inguinal nodes and establishment of axillo inguinal pathway, right UE working proximal to distal then retracing all steps    Compression Bandaging  thick stockinette, elastomull to fingers 1-5, artiflex from wrist to axilla, 1 6cm, 1 8cm and 2 12 cm bandages from hand to axilla (1st 12 cm bandage in herringbone)                  PT Long Term Goals - 05/23/19 8850      PT LONG TERM GOAL #1   Title  Pt will be able to internally and externally rotate L shoulder without hiking left shoulder    Time  4    Period  Weeks    Status  New    Target Date  07/04/19      PT LONG TERM GOAL #2   Title  Pt will be independent in self MLD for RUE to allow long term management of lymphedema    Time  4    Period  Weeks    Status  New    Target Date  07/04/19      PT LONG TERM GOAL #3   Title  Pt will report a 75% improvement in tightness in R axilla and pec with R overhead motion to allow improved comfort    Time  4    Period  Weeks    Status  New    Target Date  07/04/19      PT LONG TERM GOAL #4   Title  Pt will obtain appropriate compression garments for long term management of lymphedema    Time  4    Period  Weeks    Status  New    Target Date  07/04/19      PT LONG TERM GOAL #5   Title  Pt will be independent in a home  exercise program for continued strengthening and stretching    Time  4    Period  Weeks    Status  New    Target Date  07/04/19            Plan - 05/24/19 1221    Clinical Impression Statement  Began MLD to  RUE today and instructed pt throughout. Did not give handout today since pt will be unable to practice at home due to bandages being donned. Then applied compression bandages from R hand to axilla including fingers. Instructed pt to remove bandages if she has numbness or tingling that does not resolve when she moves or if she is having any skin reaction.    Stability/Clinical Decision Making  Stable/Uncomplicated    Rehab Potential  Good    PT Frequency  3x / week    PT Duration  6 weeks    PT Treatment/Interventions  ADLs/Self Care Home Management;Therapeutic exercise;Patient/family education;Manual techniques;Taping;Manual lymph drainage;Scar mobilization;Compression bandaging;Passive range of motion;Vasopneumatic Device;Joint Manipulations    PT Next Visit Plan  continue MLD to RUE and instruct/issue handout, cont compression bandaging and at some pt teach a family memeber to bandage to decrease to 2x/wk, PROM to bilateral shoulders, scap mobs to L, scar massage and myofascial to R pec    Consulted and Agree with Plan of Care  Patient       Patient will benefit from skilled therapeutic intervention in order to improve the following deficits and impairments:  Decreased range of motion, Decreased scar mobility, Decreased strength, Pain, Postural dysfunction, Increased edema  Visit Diagnosis: 1. Postmastectomy lymphedema        Problem List Patient Active Problem List   Diagnosis Date Noted  . Cancer of right breast, stage 2 ([place]) 01/22/2018  . Genetic testing 08/18/2017  . BRCA2 positive 08/18/2017  . Family history of breast cancer   . Family history of ovarian cancer   . Family history of prostate cancer   . Malignant neoplasm of upper-outer quadrant of right breast  in female, estrogen receptor negative ([place]) 07/25/2017  . Perforated bowel ([place]) 05/13/2015  . Free intraperitoneal air 05/12/2015  . Submucosal lesion of esophagus 10/06/2014  . 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 Diabetes ([place]) 09/24/2014  . Morbid obesity ([place]) 03/20/2014    Allyson Sabal [facility] 05/24/2019, 12:23 PM  [place], [place], [address] Phone: [phone]   Fax:  [phone]  Name: Kara Crawford MRN: [id number] Date of Birth: [birth year]  Manus Gunning, PT 05/24/19 12:24 PM

## 2019-05-28 ENCOUNTER — Encounter: Payer: Self-pay | Admitting: Physical Therapy

## 2019-05-28 ENCOUNTER — Other Ambulatory Visit: Payer: Self-pay

## 2019-05-28 ENCOUNTER — Ambulatory Visit: Payer: 59 | Admitting: Physical Therapy

## 2019-05-28 DIAGNOSIS — I972 Postmastectomy lymphedema syndrome: Secondary | ICD-10-CM | POA: Diagnosis not present

## 2019-05-28 NOTE — Therapy (Signed)
[place], [place], [address] Phone: [phone]   Fax:  [phone]  Physical Therapy Treatment  Patient Details  Name: Kara Crawford MRN: [id number] Date of Birth: [birth year] Referring Provider (PT): Dr. Theadore Nan   Encounter Date: 05/28/2019  PT End of Session - 05/28/19 1025    Visit Number  3    Number of Visits  13    Date for PT Re-Evaluation  07/04/19    PT Start Time  0934    PT Stop Time  1022    PT Time Calculation (min)  48 min    Activity Tolerance  Patient tolerated treatment well    Behavior During Therapy  [facility] for tasks assessed/performed       Past Medical History:  Diagnosis Date  . Anemia    anemia prior to uterine emboliztion procedure.  . Breast cancer ([place])   . Diabetes mellitus without complication ([place])    type 2  . Family history of breast cancer   . Family history of ovarian cancer   . Family history of prostate cancer   . Fibroids   . Headache   . Heart murmur   . History of radiation therapy 03/07/18- 04/18/18   Right Chest wall 50 Gy in 25 fractions, Right supraclavicular fossa 50 Gy in 25 fractions, Right Chest wall scar boost 10 Gy in 5 fractions.   . Hyperlipidemia   . Hypertension     Past Surgical History:  Procedure Laterality Date  . BREAST SURGERY     double mastectomy  . BREATH TEK H PYLORI N/A 04/17/2014   Procedure: BREATH TEK H PYLORI;  Surgeon: Shann Medal, MD;  Location: [facility];  Service: General;  Laterality: N/A;  . CESAREAN SECTION     x3- normal development  . CYSTOSCOPY N/A 07/18/2018   Procedure: CYSTOSCOPY;  Surgeon: Christophe Louis, MD;  Location: [facility];  Service: Gynecology;  Laterality: N/A;  . ESOPHAGOGASTRODUODENOSCOPY N/A 08/20/2015   Procedure: ESOPHAGOGASTRODUODENOSCOPY (EGD);  Surgeon: Alphonsa Overall, MD;  Location: [facility];  Service: General;  Laterality: N/A;  . EXPLORATORY LAPAROTOMY  05/13/2015  . GASTRIC  ROUX-EN-Y N/A 10/06/2014   Procedure: LAPAROSCOPIC ROUX-EN-Y GASTRIC BYPASS WITH UPPER ENDOSCOPY;  Surgeon: Pedro Earls, MD;  Location: [facility];  Service: General;  Laterality: N/A;  . LAPAROSCOPIC SALPINGO OOPHERECTOMY Bilateral 07/18/2018   Procedure: LAPAROSCOPIC SALPINGO OOPHORECTOMY;  Surgeon: Christophe Louis, MD;  Location: [facility];  Service: Gynecology;  Laterality: Bilateral;  possible laparotomy with abdominal bilateral salpingoophorectomy  . LAPAROSCOPY N/A 05/12/2015   Procedure: LAPAROSCOPY REPAIR OF PERFORATED BOWEL;  Surgeon: Alphonsa Overall, MD;  Location: [facility];  Service: General;  Laterality: N/A;  . MASTECTOMY WITH RADIOACTIVE SEED GUIDED EXCISION AND AXILLARY SENTINEL LYMPH NODE BIOPSY Bilateral 01/22/2018   Procedure: BILATERAL MASTECTOMIES WITH RADIOACTIVE SEED TARGETED RIGHT AXILLARY [place] NODE EXCISION AND RIGHT SENTINEL LYMPH NODE BIOPSY;  Surgeon: Alphonsa Overall, MD;  Location: [facility];  Service: General;  Laterality: Bilateral;  . PORT-A-CATH REMOVAL Left 01/22/2018   Procedure: REMOVAL PORT-A-CATH;  Surgeon: Alphonsa Overall, MD;  Location: [facility];  Service: General;  Laterality: Left;  . PORTACATH PLACEMENT N/A 08/02/2017   Procedure: INSERTION PORT-A-CATH;  Surgeon: Alphonsa Overall, MD;  Location: [facility];  Service: General;  Laterality: N/A;  . UTERINE ARTERY EMBOLIZATION  08-16-2010    There were no vitals filed for this visit.  Subjective Assessment - 05/28/19 0937    Subjective  I  had to take the bandages off on Sunday for a little bit and my husband and son put them back on for me.    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 It measures 1 cm and is located in the upper outer quadrant with a Ki67 of 90%. She had 3 abnormal looking axillary nodes on ultrasound and 1 was biopsied and found to be positive, 01/22/18- underwent bilateral mastectomy and a SLNB on the right side with 6/6  were negative, pt has completed chemotherapy and radiation.  Has been seen here previously for lymphedema and shoulder ROM.      Patient Stated Goals  to get the swelling down    Currently in Pain?  No/denies    Pain Score  0-No pain                  [facility]patient [facility]  Lymphedema Life Impact Scale Total Score  5.88 %           [facility] Adult PT Treatment/Exercise - 05/28/19 0001      Manual Therapy   Manual Lymphatic Drainage (MLD)  short neck, superficial and deep abdominals, left axillary nodes and establishment of interaxillary pathway, right inguinal nodes and establishment of axillo inguinal pathway, right UE working proximal to distal then retracing all steps    Compression Bandaging  thick stockinette, elastomull to fingers 1-5, artiflex from wrist to axilla, 1 6cm, 1 8cm and 2 12 cm bandages from hand to axilla (1st 12 cm bandage in herringbone)                  PT Long Term Goals - 05/23/19 4818      PT LONG TERM GOAL #1   Title  Pt will be able to internally and externally rotate L shoulder without hiking left shoulder    Time  4    Period  Weeks    Status  New    Target Date  07/04/19      PT LONG TERM GOAL #2   Title  Pt will be independent in self MLD for RUE to allow long term management of lymphedema    Time  4    Period  Weeks    Status  New    Target Date  07/04/19      PT LONG TERM GOAL #3   Title  Pt will report a 75% improvement in tightness in R axilla and pec with R overhead motion to allow improved comfort    Time  4    Period  Weeks    Status  New    Target Date  07/04/19      PT LONG TERM GOAL #4   Title  Pt will obtain appropriate compression garments for long term management of lymphedema    Time  4    Period  Weeks    Status  New    Target Date  07/04/19      PT LONG TERM GOAL #5   Title  Pt will be independent in a home exercise program for continued  strengthening and stretching    Time  4    Period  Weeks    Status  New    Target Date  07/04/19            Plan - 05/28/19 1025    Clinical Impression Statement  Removed  pt's bandages today and pt reports her arm is visibly reduced. She said her fingers were much smaller. Continued with MLD and compression bandaging to RUE. Will have pt measured for compression garments by Melissa with [employer] on 8/17. Pt will bring her husband to one of her appointments so he can be instructed in how to apply compression bandages.    Comorbidities  radiation history, scar tissue present    Examination-Activity Limitations  Lift;Reach Overhead;Carry    Examination-Participation Restrictions  Cleaning;Yard Work    Stability/Clinical Decision Making  Stable/Uncomplicated    Rehab Potential  Good    PT Frequency  3x / week    PT Duration  6 weeks    PT Treatment/Interventions  ADLs/Self Care Home Management;Therapeutic exercise;Patient/family education;Manual techniques;Taping;Manual lymph drainage;Scar mobilization;Compression bandaging;Passive range of motion;Vasopneumatic Device;Joint Manipulations    PT Next Visit Plan  continue MLD to RUE and instruct/issue handout, cont compression bandaging and at some pt teach a family memeber to bandage to decrease to 2x/wk, PROM to bilateral shoulders, scap mobs to L, scar massage and myofascial to R pec    Consulted and Agree with Plan of Care  Patient       Patient will benefit from skilled therapeutic intervention in order to improve the following deficits and impairments:  Decreased range of motion, Decreased scar mobility, Decreased strength, Pain, Postural dysfunction, Increased edema  Visit Diagnosis: 1. Postmastectomy lymphedema        Problem List Patient Active Problem List   Diagnosis Date Noted  . Cancer of right breast, stage 2 ([place]) 01/22/2018  . Genetic testing 08/18/2017  . BRCA2 positive 08/18/2017  . Family history of breast cancer    . Family history of ovarian cancer   . Family history of prostate cancer   . Malignant neoplasm of upper-outer quadrant of right breast in female, estrogen receptor negative ([place]) 07/25/2017  . Perforated bowel ([place]) 05/13/2015  . Free intraperitoneal air 05/12/2015  . Submucosal lesion of esophagus 10/06/2014  . 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 Diabetes ([place]) 09/24/2014  . Morbid obesity ([place]) 03/20/2014    Allyson Sabal [facility] 05/28/2019, 10:27 AM  [place], [place], [address] Phone: [phone]   Fax:  [phone]  Name: Kara Crawford MRN: [id number] Date of Birth: 03-19-1966  Manus Gunning, PT 05/28/19 10:28 AM

## 2019-05-30 ENCOUNTER — Ambulatory Visit: Payer: 59 | Admitting: Physical Therapy

## 2019-05-30 ENCOUNTER — Encounter: Payer: Self-pay | Admitting: Physical Therapy

## 2019-05-30 ENCOUNTER — Other Ambulatory Visit: Payer: Self-pay

## 2019-05-30 DIAGNOSIS — I972 Postmastectomy lymphedema syndrome: Secondary | ICD-10-CM | POA: Diagnosis not present

## 2019-05-30 NOTE — Therapy (Signed)
[place], [place], [address] Phone: [phone]   Fax:  [phone]  Physical Therapy Treatment  Patient Details  Name: Kara Crawford MRN: [id number] Date of Birth: 1965-12-05 Referring Provider (PT): Dr. Theadore Nan   Encounter Date: 05/30/2019  PT End of Session - 05/30/19 0933    Visit Number  4    Number of Visits  13    Date for PT Re-Evaluation  07/04/19    PT Start Time  7654   pt arrived late   PT Stop Time  0920    PT Time Calculation (min)  41 min    Activity Tolerance  Patient tolerated treatment well    Behavior During Therapy  [facility] for tasks assessed/performed       Past Medical History:  Diagnosis Date  . Anemia    anemia prior to uterine emboliztion procedure.  . Breast cancer ([place])   . Diabetes mellitus without complication ([place])    type 2  . Family history of breast cancer   . Family history of ovarian cancer   . Family history of prostate cancer   . Fibroids   . Headache   . Heart murmur   . History of radiation therapy 03/07/18- 04/18/18   Right Chest wall 50 Gy in 25 fractions, Right supraclavicular fossa 50 Gy in 25 fractions, Right Chest wall scar boost 10 Gy in 5 fractions.   . Hyperlipidemia   . Hypertension     Past Surgical History:  Procedure Laterality Date  . BREAST SURGERY     double mastectomy  . BREATH TEK H PYLORI N/A 04/17/2014   Procedure: BREATH TEK H PYLORI;  Surgeon: Shann Medal, MD;  Location: [facility];  Service: General;  Laterality: N/A;  . CESAREAN SECTION     x3- normal development  . CYSTOSCOPY N/A 07/18/2018   Procedure: CYSTOSCOPY;  Surgeon: Christophe Louis, MD;  Location: [facility];  Service: Gynecology;  Laterality: N/A;  . ESOPHAGOGASTRODUODENOSCOPY N/A 08/20/2015   Procedure: ESOPHAGOGASTRODUODENOSCOPY (EGD);  Surgeon: Alphonsa Overall, MD;  Location: [facility];  Service: General;  Laterality: N/A;  . EXPLORATORY LAPAROTOMY  05/13/2015   . GASTRIC ROUX-EN-Y N/A 10/06/2014   Procedure: LAPAROSCOPIC ROUX-EN-Y GASTRIC BYPASS WITH UPPER ENDOSCOPY;  Surgeon: Pedro Earls, MD;  Location: [facility];  Service: General;  Laterality: N/A;  . LAPAROSCOPIC SALPINGO OOPHERECTOMY Bilateral 07/18/2018   Procedure: LAPAROSCOPIC SALPINGO OOPHORECTOMY;  Surgeon: Christophe Louis, MD;  Location: [facility];  Service: Gynecology;  Laterality: Bilateral;  possible laparotomy with abdominal bilateral salpingoophorectomy  . LAPAROSCOPY N/A 05/12/2015   Procedure: LAPAROSCOPY REPAIR OF PERFORATED BOWEL;  Surgeon: Alphonsa Overall, MD;  Location: [facility];  Service: General;  Laterality: N/A;  . MASTECTOMY WITH RADIOACTIVE SEED GUIDED EXCISION AND AXILLARY SENTINEL LYMPH NODE BIOPSY Bilateral 01/22/2018   Procedure: BILATERAL MASTECTOMIES WITH RADIOACTIVE SEED TARGETED RIGHT AXILLARY [place] NODE EXCISION AND RIGHT SENTINEL LYMPH NODE BIOPSY;  Surgeon: Alphonsa Overall, MD;  Location: [facility];  Service: General;  Laterality: Bilateral;  . PORT-A-CATH REMOVAL Left 01/22/2018   Procedure: REMOVAL PORT-A-CATH;  Surgeon: Alphonsa Overall, MD;  Location: [facility];  Service: General;  Laterality: Left;  . PORTACATH PLACEMENT N/A 08/02/2017   Procedure: INSERTION PORT-A-CATH;  Surgeon: Alphonsa Overall, MD;  Location: [facility];  Service: General;  Laterality: N/A;  . UTERINE ARTERY EMBOLIZATION  08-16-2010    There were no vitals filed for this visit.  Subjective Assessment - 05/30/19 0840  Subjective  The bandages feel ok. It is just hard to sleep. It is hard to bend my elbow.    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 It measures 1 cm and is located in the upper outer quadrant with a Ki67 of 90%. She had 3 abnormal looking axillary nodes on ultrasound and 1 was biopsied and found to be positive, 01/22/18- underwent bilateral mastectomy and a SLNB on the right side with 6/6 were negative, pt  has completed chemotherapy and radiation.  Has been seen here previously for lymphedema and shoulder ROM.      Patient Stated Goals  to get the swelling down    Currently in Pain?  No/denies    Pain Score  0-No pain            LYMPHEDEMA/ONCOLOGY QUESTIONNAIRE - 05/30/19 0840      Right Upper Extremity Lymphedema   15 cm Proximal to Olecranon Process  33.5 cm    Olecranon Process  30 cm    15 cm Proximal to Ulnar Styloid Process  29.3 cm    Just Proximal to Ulnar Styloid Process  19 cm    Across Hand at [employer]  20.2 cm    At [place] of 2nd Digit  6.6 cm           Outpatient Re[facility] from [facility]  Lymphedema Life Impact Scale Total Score  5.88 %           [facility] Adult PT Treatment/Exercise - 05/30/19 0001      Manual Therapy   Manual Lymphatic Drainage (MLD)  short neck, 5 diaphragmatic breaths, left axillary nodes and establishment of interaxillary pathway, right inguinal nodes and establishment of axillo inguinal pathway, right UE working proximal to distal then retracing all steps    Compression Bandaging  thick stockinette, elastomull to fingers 1-5, 1/2 gray foam to back of hand and antecubital fossa, 1 artiflex from wrist to axilla, 1 6cm, 1 8cm and 2 12 cm bandages from hand to axilla (1st 12 cm bandage in herringbone)                  PT Long Term Goals - 05/23/19 6270      PT LONG TERM GOAL #1   Title  Pt will be able to internally and externally rotate L shoulder without hiking left shoulder    Time  4    Period  Weeks    Status  New    Target Date  07/04/19      PT LONG TERM GOAL #2   Title  Pt will be independent in self MLD for RUE to allow long term management of lymphedema    Time  4    Period  Weeks    Status  New    Target Date  07/04/19      PT LONG TERM GOAL #3   Title  Pt will report a 75% improvement in tightness in R axilla and pec with R overhead motion to allow improved  comfort    Time  4    Period  Weeks    Status  New    Target Date  07/04/19      PT LONG TERM GOAL #4   Title  Pt will obtain appropriate compression garments for long term management of lymphedema    Time  4    Period  Weeks    Status  New    Target Date  07/04/19      PT LONG TERM GOAL #5   Title  Pt will be independent in a home exercise program for continued strengthening and stretching    Time  4    Period  Weeks    Status  New    Target Date  07/04/19            Plan - 05/30/19 0934    Clinical Impression Statement  Removed bandages today and took circumferential measurements. Pt demonstrates reduction throughout her RUE. Added grey foam to back of hand where she has fullness and to antecubital fossa where she has some irritation.    Stability/Clinical Decision Making  Stable/Uncomplicated    Rehab Potential  Good    PT Frequency  3x / week    PT Duration  6 weeks    PT Treatment/Interventions  ADLs/Self Care Home Management;Therapeutic exercise;Patient/family education;Manual techniques;Taping;Manual lymph drainage;Scar mobilization;Compression bandaging;Passive range of motion;Vasopneumatic Device;Joint Manipulations    PT Next Visit Plan  continue MLD to RUE and instruct/issue handout, cont compression bandaging and at some pt teach a family memeber to bandage to decrease to 2x/wk, PROM to bilateral shoulders, scap mobs to L, scar massage and myofascial to R pec    Consulted and Agree with Plan of Care  Patient       Patient will benefit from skilled therapeutic intervention in order to improve the following deficits and impairments:  Decreased range of motion, Decreased scar mobility, Decreased strength, Pain, Postural dysfunction, Increased edema  Visit Diagnosis: 1. Postmastectomy lymphedema        Problem List Patient Active Problem List   Diagnosis Date Noted  . Cancer of right breast, stage 2 ([place]) 01/22/2018  . Genetic testing 08/18/2017  . BRCA2  positive 08/18/2017  . Family history of breast cancer   . Family history of ovarian cancer   . Family history of prostate cancer   . Malignant neoplasm of upper-outer quadrant of right breast in female, estrogen receptor negative ([place]) 07/25/2017  . Perforated bowel ([place]) 05/13/2015  . Free intraperitoneal air 05/12/2015  . Submucosal lesion of esophagus 10/06/2014  . 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 Diabetes ([place]) 09/24/2014  . Morbid obesity ([place]) 03/20/2014    Allyson Sabal [facility] 05/30/2019, 9:36 AM  [place], [place], [address] Phone: [phone]   Fax:  [phone]  Name: Kara Crawford MRN: [id number] Date of Birth: January 12, 1966  Manus Gunning, PT 05/30/19 9:37 AM

## 2019-05-31 ENCOUNTER — Other Ambulatory Visit: Payer: Self-pay

## 2019-05-31 ENCOUNTER — Ambulatory Visit: Payer: 59 | Admitting: Physical Therapy

## 2019-05-31 ENCOUNTER — Encounter: Payer: Self-pay | Admitting: Physical Therapy

## 2019-05-31 DIAGNOSIS — I972 Postmastectomy lymphedema syndrome: Secondary | ICD-10-CM | POA: Diagnosis not present

## 2019-05-31 NOTE — Therapy (Signed)
[place], [place], [address] Phone: [phone]   Fax:  [phone]  Physical Therapy Treatment  Patient Details  Name: Kara Crawford MRN: [id number] Date of Birth: 02/07/1966 Referring Provider (PT): Dr. Theadore Nan   Encounter Date: 05/31/2019  PT End of Session - 05/31/19 1222    Visit Number  5    Number of Visits  13    Date for PT Re-Evaluation  07/04/19    PT Start Time  1135    PT Stop Time  1215    PT Time Calculation (min)  40 min    Activity Tolerance  Patient tolerated treatment well    Behavior During Therapy  [facility] for tasks assessed/performed       Past Medical History:  Diagnosis Date  . Anemia    anemia prior to uterine emboliztion procedure.  . Breast cancer ([place])   . Diabetes mellitus without complication ([place])    type 2  . Family history of breast cancer   . Family history of ovarian cancer   . Family history of prostate cancer   . Fibroids   . Headache   . Heart murmur   . History of radiation therapy 03/07/18- 04/18/18   Right Chest wall 50 Gy in 25 fractions, Right supraclavicular fossa 50 Gy in 25 fractions, Right Chest wall scar boost 10 Gy in 5 fractions.   . Hyperlipidemia   . Hypertension     Past Surgical History:  Procedure Laterality Date  . BREAST SURGERY     double mastectomy  . BREATH TEK H PYLORI N/A 04/17/2014   Procedure: BREATH TEK H PYLORI;  Surgeon: Shann Medal, MD;  Location: [facility];  Service: General;  Laterality: N/A;  . CESAREAN SECTION     x3- normal development  . CYSTOSCOPY N/A 07/18/2018   Procedure: CYSTOSCOPY;  Surgeon: Christophe Louis, MD;  Location: [facility];  Service: Gynecology;  Laterality: N/A;  . ESOPHAGOGASTRODUODENOSCOPY N/A 08/20/2015   Procedure: ESOPHAGOGASTRODUODENOSCOPY (EGD);  Surgeon: Alphonsa Overall, MD;  Location: [facility];  Service: General;  Laterality: N/A;  . EXPLORATORY LAPAROTOMY  05/13/2015  . GASTRIC  ROUX-EN-Y N/A 10/06/2014   Procedure: LAPAROSCOPIC ROUX-EN-Y GASTRIC BYPASS WITH UPPER ENDOSCOPY;  Surgeon: Pedro Earls, MD;  Location: [facility];  Service: General;  Laterality: N/A;  . LAPAROSCOPIC SALPINGO OOPHERECTOMY Bilateral 07/18/2018   Procedure: LAPAROSCOPIC SALPINGO OOPHORECTOMY;  Surgeon: Christophe Louis, MD;  Location: [facility];  Service: Gynecology;  Laterality: Bilateral;  possible laparotomy with abdominal bilateral salpingoophorectomy  . LAPAROSCOPY N/A 05/12/2015   Procedure: LAPAROSCOPY REPAIR OF PERFORATED BOWEL;  Surgeon: Alphonsa Overall, MD;  Location: [facility];  Service: General;  Laterality: N/A;  . MASTECTOMY WITH RADIOACTIVE SEED GUIDED EXCISION AND AXILLARY SENTINEL LYMPH NODE BIOPSY Bilateral 01/22/2018   Procedure: BILATERAL MASTECTOMIES WITH RADIOACTIVE SEED TARGETED RIGHT AXILLARY [place] NODE EXCISION AND RIGHT SENTINEL LYMPH NODE BIOPSY;  Surgeon: Alphonsa Overall, MD;  Location: [place];  Service: General;  Laterality: Bilateral;  . PORT-A-CATH REMOVAL Left 01/22/2018   Procedure: REMOVAL PORT-A-CATH;  Surgeon: Alphonsa Overall, MD;  Location: [facility];  Service: General;  Laterality: Left;  . PORTACATH PLACEMENT N/A 08/02/2017   Procedure: INSERTION PORT-A-CATH;  Surgeon: Alphonsa Overall, MD;  Location: [facility];  Service: General;  Laterality: N/A;  . UTERINE ARTERY EMBOLIZATION  08-16-2010    There were no vitals filed for this visit.  Subjective Assessment - 05/31/19 1138    Subjective  The  bandages just feel so heavy. It is ok just very heavy.    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 It measures 1 cm and is located in the upper outer quadrant with a Ki67 of 90%. She had 3 abnormal looking axillary nodes on ultrasound and 1 was biopsied and found to be positive, 01/22/18- underwent bilateral mastectomy and a SLNB on the right side with 6/6 were negative, pt has completed chemotherapy and  radiation.  Has been seen here previously for lymphedema and shoulder ROM.      Patient Stated Goals  to get the swelling down    Currently in Pain?  No/denies    Pain Score  0-No pain                  [facility]patient [facility]  Lymphedema Life Impact Scale Total Score  5.88 %           [facility] Adult PT Treatment/Exercise - 05/31/19 0001      Manual Therapy   Manual Lymphatic Drainage (MLD)  short neck, superficial and deep abdominals, left axillary nodes and establishment of interaxillary pathway, right inguinal nodes and establishment of axillo inguinal pathway, right UE working proximal to distal then retracing all steps    Compression Bandaging  thick stockinette, elastomull to fingers 1-5, 1/2 gray foam to back of hand and antecubital fossa, 1 artiflex from wrist to axilla, 1 6cm, 1 8cm and 2 12 cm bandages from hand to axilla (1st 12 cm bandage in herringbone)                  PT Long Term Goals - 05/23/19 9233      PT LONG TERM GOAL #1   Title  Pt will be able to internally and externally rotate L shoulder without hiking left shoulder    Time  4    Period  Weeks    Status  New    Target Date  07/04/19      PT LONG TERM GOAL #2   Title  Pt will be independent in self MLD for RUE to allow long term management of lymphedema    Time  4    Period  Weeks    Status  New    Target Date  07/04/19      PT LONG TERM GOAL #3   Title  Pt will report a 75% improvement in tightness in R axilla and pec with R overhead motion to allow improved comfort    Time  4    Period  Weeks    Status  New    Target Date  07/04/19      PT LONG TERM GOAL #4   Title  Pt will obtain appropriate compression garments for long term management of lymphedema    Time  4    Period  Weeks    Status  New    Target Date  07/04/19      PT LONG TERM GOAL #5   Title  Pt will be independent in a home exercise program for  continued strengthening and stretching    Time  4    Period  Weeks    Status  New    Target Date  07/04/19            Plan - 05/31/19 1222    Clinical Impression Statement  Continued MLD  and compression bandaging today. Pt reports that the foam helped decrease discomfort in antecubital fossa and foam on back of hand helped reduce this area per visual estimate. Will continue CDT until pt reaches maximal reduction.    Rehab Potential  Good    PT Frequency  3x / week    PT Duration  6 weeks    PT Treatment/Interventions  ADLs/Self Care Home Management;Therapeutic exercise;Patient/family education;Manual techniques;Taping;Manual lymph drainage;Scar mobilization;Compression bandaging;Passive range of motion;Vasopneumatic Device;Joint Manipulations    PT Next Visit Plan  continue MLD to RUE and instruct/issue handout, cont compression bandaging and at some pt teach a family memeber to bandage to decrease to 2x/wk, PROM to bilateral shoulders, scap mobs to L, scar massage and myofascial to R pec    Consulted and Agree with Plan of Care  Patient       Patient will benefit from skilled therapeutic intervention in order to improve the following deficits and impairments:  Decreased range of motion, Decreased scar mobility, Decreased strength, Pain, Postural dysfunction, Increased edema  Visit Diagnosis: 1. Postmastectomy lymphedema        Problem List Patient Active Problem List   Diagnosis Date Noted  . Cancer of right breast, stage 2 ([place]) 01/22/2018  . Genetic testing 08/18/2017  . BRCA2 positive 08/18/2017  . Family history of breast cancer   . Family history of ovarian cancer   . Family history of prostate cancer   . Malignant neoplasm of upper-outer quadrant of right breast in female, estrogen receptor negative ([place]) 07/25/2017  . Perforated bowel ([place]) 05/13/2015  . Free intraperitoneal air 05/12/2015  . Submucosal lesion of esophagus 10/06/2014  . 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 Diabetes ([place]) 09/24/2014  . Morbid obesity ([place]) 03/20/2014    Allyson Sabal [facility] 05/31/2019, 12:23 PM  [place], [place], [address] Phone: [phone]   Fax:  [phone]  Name: SIRIA CALANDRO MRN: [id number] Date of Birth: Apr 16, 1966  Manus Gunning, PT 05/31/19 12:24 PM

## 2019-06-04 ENCOUNTER — Other Ambulatory Visit: Payer: Self-pay

## 2019-06-04 ENCOUNTER — Encounter: Payer: Self-pay | Admitting: Physical Therapy

## 2019-06-04 ENCOUNTER — Ambulatory Visit: Payer: 59 | Attending: Family Medicine | Admitting: Physical Therapy

## 2019-06-04 DIAGNOSIS — I972 Postmastectomy lymphedema syndrome: Secondary | ICD-10-CM | POA: Insufficient documentation

## 2019-06-04 NOTE — Therapy (Signed)
[place], [place], [address] Phone: [phone]   Fax:  [phone]  Physical Therapy Treatment  Patient Details  Name: Kara Crawford MRN: [id number] Date of Birth: 1965-12-08 Referring Provider (PT): Dr. Theadore Nan   Encounter Date: 06/04/2019  PT End of Session - 06/04/19 0918    Visit Number  6    Number of Visits  13    Date for PT Re-Evaluation  07/04/19    PT Start Time  0834    PT Stop Time  0915    PT Time Calculation (min)  41 min    Activity Tolerance  Patient tolerated treatment well    Behavior During Therapy  [facility] for tasks assessed/performed       Past Medical History:  Diagnosis Date  . Anemia    anemia prior to uterine emboliztion procedure.  . Breast cancer ([place])   . Diabetes mellitus without complication ([place])    type 2  . Family history of breast cancer   . Family history of ovarian cancer   . Family history of prostate cancer   . Fibroids   . Headache   . Heart murmur   . History of radiation therapy 03/07/18- 04/18/18   Right Chest wall 50 Gy in 25 fractions, Right supraclavicular fossa 50 Gy in 25 fractions, Right Chest wall scar boost 10 Gy in 5 fractions.   . Hyperlipidemia   . Hypertension     Past Surgical History:  Procedure Laterality Date  . BREAST SURGERY     double mastectomy  . BREATH TEK H PYLORI N/A 04/17/2014   Procedure: BREATH TEK H PYLORI;  Surgeon: Shann Medal, MD;  Location: [facility];  Service: General;  Laterality: N/A;  . CESAREAN SECTION     x3- normal development  . CYSTOSCOPY N/A 07/18/2018   Procedure: CYSTOSCOPY;  Surgeon: Christophe Louis, MD;  Location: [facility];  Service: Gynecology;  Laterality: N/A;  . ESOPHAGOGASTRODUODENOSCOPY N/A 08/20/2015   Procedure: ESOPHAGOGASTRODUODENOSCOPY (EGD);  Surgeon: Alphonsa Overall, MD;  Location: [facility];  Service: General;  Laterality: N/A;  . EXPLORATORY LAPAROTOMY  05/13/2015  . GASTRIC ROUX-EN-Y  N/A 10/06/2014   Procedure: LAPAROSCOPIC ROUX-EN-Y GASTRIC BYPASS WITH UPPER ENDOSCOPY;  Surgeon: Pedro Earls, MD;  [facility];  Service: General;  Laterality: N/A;  . LAPAROSCOPIC SALPINGO OOPHERECTOMY Bilateral 07/18/2018   Procedure: LAPAROSCOPIC SALPINGO OOPHORECTOMY;  Surgeon: Christophe Louis, MD;  Location: [facility];  Service: Gynecology;  Laterality: Bilateral;  possible laparotomy with abdominal bilateral salpingoophorectomy  . LAPAROSCOPY N/A 05/12/2015   Procedure: LAPAROSCOPY REPAIR OF PERFORATED BOWEL;  Surgeon: Alphonsa Overall, MD;  Location: [facility];  Service: General;  Laterality: N/A;  . MASTECTOMY WITH RADIOACTIVE SEED GUIDED EXCISION AND AXILLARY SENTINEL LYMPH NODE BIOPSY Bilateral 01/22/2018   Procedure: BILATERAL MASTECTOMIES WITH RADIOACTIVE SEED TARGETED RIGHT AXILLARY [place] NODE EXCISION AND RIGHT SENTINEL LYMPH NODE BIOPSY;  Surgeon: Alphonsa Overall, MD;  Location: [place];  Service: General;  Laterality: Bilateral;  . PORT-A-CATH REMOVAL Left 01/22/2018   Procedure: REMOVAL PORT-A-CATH;  Surgeon: Alphonsa Overall, MD;  Location: [facility];  Service: General;  Laterality: Left;  . PORTACATH PLACEMENT N/A 08/02/2017   Procedure: INSERTION PORT-A-CATH;  Surgeon: Alphonsa Overall, MD;  Location: [facility];  Service: General;  Laterality: N/A;  . UTERINE ARTERY EMBOLIZATION  08-16-2010    There were no vitals filed for this visit.  Subjective Assessment - 06/04/19 0835    Subjective  The  bandaging did well. Last night I took them off to take a shower. I did it a little lighter because my shoulder and arm started hurting.    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 It measures 1 cm and is located in the upper outer quadrant with a Ki67 of 90%. She had 3 abnormal looking axillary nodes on ultrasound and 1 was biopsied and found to be positive, 01/22/18- underwent bilateral mastectomy and a SLNB on  the right side with 6/6 were negative, pt has completed chemotherapy and radiation.  Has been seen here previously for lymphedema and shoulder ROM.      Patient Stated Goals  to get the swelling down    Currently in Pain?  No/denies    Pain Score  0-No pain                  Outpatient [facility]  Lymphedema Life Impact Scale Total Score  5.88 %           [facility] Adult PT Treatment/Exercise - 06/04/19 0001      Manual Therapy   Manual Lymphatic Drainage (MLD)  short neck, superficial and deep abdominals, left axillary nodes and establishment of interaxillary pathway, right inguinal nodes and establishment of axillo inguinal pathway, right UE working proximal to distal then retracing all steps    Compression Bandaging  thick stockinette, elastomull to fingers 1-5, 1/2 gray foam to back of hand and antecubital fossa, 1 artiflex from wrist to axilla, 1 6cm, 1 8cm and 2 12 cm bandages from hand to axilla (1st 12 cm bandage in herringbone)                  PT Long Term Goals - 05/23/19 2130      PT LONG TERM GOAL #1   Title  Pt will be able to internally and externally rotate L shoulder without hiking left shoulder    Time  4    Period  Weeks    Status  New    Target Date  07/04/19      PT LONG TERM GOAL #2   Title  Pt will be independent in self MLD for RUE to allow long term management of lymphedema    Time  4    Period  Weeks    Status  New    Target Date  07/04/19      PT LONG TERM GOAL #3   Title  Pt will report a 75% improvement in tightness in R axilla and pec with R overhead motion to allow improved comfort    Time  4    Period  Weeks    Status  New    Target Date  07/04/19      PT LONG TERM GOAL #4   Title  Pt will obtain appropriate compression garments for long term management of lymphedema    Time  4    Period  Weeks    Status  New    Target Date  07/04/19      PT LONG TERM GOAL  #5   Title  Pt will be independent in a home exercise program for continued strengthening and stretching    Time  4    Period  Weeks    Status  New    Target Date  07/04/19  Plan - 06/04/19 0919    Clinical Impression Statement  Pt had some shoulder discomfort during sleep with the bandages. Educated pt to try sleeping with arm propped on pillow. Continued MLD and compression bandaging until pt is maximally reduced. Pt will be measured for compression garments on Aug 17.    Stability/Clinical Decision Making  Stable/Uncomplicated    Rehab Potential  Good    PT Frequency  3x / week    PT Duration  6 weeks    PT Treatment/Interventions  ADLs/Self Care Home Management;Therapeutic exercise;Patient/family education;Manual techniques;Taping;Manual lymph drainage;Scar mobilization;Compression bandaging;Passive range of motion;Vasopneumatic Device;Joint Manipulations    PT Next Visit Plan  continue MLD to RUE and instruct/issue handout, cont compression bandaging and at some pt teach a family memeber to bandage to decrease to 2x/wk, PROM to bilateral shoulders, scap mobs to L, scar massage and myofascial to R pec    Consulted and Agree with Plan of Care  Patient       Patient will benefit from skilled therapeutic intervention in order to improve the following deficits and impairments:  Decreased range of motion, Decreased scar mobility, Decreased strength, Pain, Postural dysfunction, Increased edema  Visit Diagnosis: 1. Postmastectomy lymphedema        Problem List Patient Active Problem List   Diagnosis Date Noted  . Cancer of right breast, stage 2 ([place]) 01/22/2018  . Genetic testing 08/18/2017  . BRCA2 positive 08/18/2017  . Family history of breast cancer   . Family history of ovarian cancer   . Family history of prostate cancer   . Malignant neoplasm of upper-outer quadrant of right breast in female, estrogen receptor negative ([place]) 07/25/2017  . Perforated bowel  ([place]) 05/13/2015  . Free intraperitoneal air 05/12/2015  . Submucosal lesion of esophagus 10/06/2014  . 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 Diabetes ([place]) 09/24/2014  . Morbid obesity ([place]) 03/20/2014    Allyson Sabal [facility] 06/04/2019, 9:20 AM  [place], [place], [address] Phone: [phone]   Fax:  [phone]  Name: Kara Crawford MRN: [id number] Date of Birth: 1965-11-04  Manus Gunning, PT 06/04/19 9:20 AM

## 2019-06-06 ENCOUNTER — Ambulatory Visit: Payer: 59 | Admitting: Physical Therapy

## 2019-06-06 ENCOUNTER — Other Ambulatory Visit: Payer: Self-pay

## 2019-06-06 ENCOUNTER — Encounter: Payer: Self-pay | Admitting: Physical Therapy

## 2019-06-06 DIAGNOSIS — I972 Postmastectomy lymphedema syndrome: Secondary | ICD-10-CM

## 2019-06-06 NOTE — Therapy (Signed)
[place], [place], [address] Phone: [phone]   Fax:  [phone]  Physical Therapy Treatment  Patient Details  Name: Kara Crawford MRN: [id number] Date of Birth: September 29, 1966 Referring Provider (PT): Dr. Theadore Nan   Encounter Date: 06/06/2019  PT End of Session - 06/06/19 0922    Visit Number  7    Number of Visits  13    Date for PT Re-Evaluation  07/04/19    PT Start Time  3559   pt arrived late   PT Stop Time  0917    PT Time Calculation (min)  39 min    Activity Tolerance  Patient tolerated treatment well    Behavior During Therapy  [facility] for tasks assessed/performed       Past Medical History:  Diagnosis Date  . Anemia    anemia prior to uterine emboliztion procedure.  . Breast cancer ([place])   . Diabetes mellitus without complication ([place])    type 2  . Family history of breast cancer   . Family history of ovarian cancer   . Family history of prostate cancer   . Fibroids   . Headache   . Heart murmur   . History of radiation therapy 03/07/18- 04/18/18   Right Chest wall 50 Gy in 25 fractions, Right supraclavicular fossa 50 Gy in 25 fractions, Right Chest wall scar boost 10 Gy in 5 fractions.   . Hyperlipidemia   . Hypertension     Past Surgical History:  Procedure Laterality Date  . BREAST SURGERY     double mastectomy  . BREATH TEK H PYLORI N/A 04/17/2014   Procedure: BREATH TEK H PYLORI;  Surgeon: Shann Medal, MD;  Location: [facility];  Service: General;  Laterality: N/A;  . CESAREAN SECTION     x3- normal development  . CYSTOSCOPY N/A 07/18/2018   Procedure: CYSTOSCOPY;  Surgeon: Christophe Louis, MD;  Location: [facility];  Service: Gynecology;  Laterality: N/A;  . ESOPHAGOGASTRODUODENOSCOPY N/A 08/20/2015   Procedure: ESOPHAGOGASTRODUODENOSCOPY (EGD);  Surgeon: Alphonsa Overall, MD;  Location: [facility];  Service: General;  Laterality: N/A;  . EXPLORATORY LAPAROTOMY  05/13/2015  .  GASTRIC ROUX-EN-Y N/A 10/06/2014   Procedure: LAPAROSCOPIC ROUX-EN-Y GASTRIC BYPASS WITH UPPER ENDOSCOPY;  Surgeon: Pedro Earls, MD;  Location: [facility];  Service: General;  Laterality: N/A;  . LAPAROSCOPIC SALPINGO OOPHERECTOMY Bilateral 07/18/2018   Procedure: LAPAROSCOPIC SALPINGO OOPHORECTOMY;  Surgeon: Christophe Louis, MD;  Location: [facility];  Service: Gynecology;  Laterality: Bilateral;  possible laparotomy with abdominal bilateral salpingoophorectomy  . LAPAROSCOPY N/A 05/12/2015   Procedure: LAPAROSCOPY REPAIR OF PERFORATED BOWEL;  Surgeon: Alphonsa Overall, MD;  Location: [facility];  Service: General;  Laterality: N/A;  . MASTECTOMY WITH RADIOACTIVE SEED GUIDED EXCISION AND AXILLARY SENTINEL LYMPH NODE BIOPSY Bilateral 01/22/2018   Procedure: BILATERAL MASTECTOMIES WITH RADIOACTIVE SEED TARGETED RIGHT AXILLARY [place] NODE EXCISION AND RIGHT SENTINEL LYMPH NODE BIOPSY;  Surgeon: Alphonsa Overall, MD;  Location: [facility];  Service: General;  Laterality: Bilateral;  . PORT-A-CATH REMOVAL Left 01/22/2018   Procedure: REMOVAL PORT-A-CATH;  Surgeon: Alphonsa Overall, MD;  Location: [facility];  Service: General;  Laterality: Left;  . PORTACATH PLACEMENT N/A 08/02/2017   Procedure: INSERTION PORT-A-CATH;  Surgeon: Alphonsa Overall, MD;  Location: [facility];  Service: General;  Laterality: N/A;  . UTERINE ARTERY EMBOLIZATION  08-16-2010    There were no vitals filed for this visit.  Subjective Assessment - 06/06/19 7416  Subjective  The bandages did good. Last night I took some layers off because my shoulders were hurting. Once I get up and stretch I am fine.    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 It measures 1 cm and is located in the upper outer quadrant with a Ki67 of 90%. She had 3 abnormal looking axillary nodes on ultrasound and 1 was biopsied and found to be positive, 01/22/18- underwent bilateral mastectomy and a  SLNB on the right side with 6/6 were negative, pt has completed chemotherapy and radiation.  Has been seen here previously for lymphedema and shoulder ROM.      Patient Stated Goals  to get the swelling down    Currently in Pain?  No/denies    Pain Score  0-No pain                  [facility]patient [facility]  Lymphedema Life Impact Scale Total Score  5.88 %           [facility] Adult PT Treatment/Exercise - 06/06/19 0001      Manual Therapy   Manual Lymphatic Drainage (MLD)  short neck, 5 diaphragmatic breaths left axillary nodes and establishment of interaxillary pathway, right inguinal nodes and establishment of axillo inguinal pathway, right UE working proximal to distal then retracing all steps   pt required v/c for diaphragmatic breathing   Compression Bandaging  thick stockinette, elastomull to fingers 1-5, 1/2 gray foam to back of hand and antecubital fossa, 1 artiflex from wrist to axilla, 1 6cm, 1 8cm and 2 12 cm bandages from hand to axilla (1st 12 cm bandage in herringbone)                  PT Long Term Goals - 05/23/19 3845      PT LONG TERM GOAL #1   Title  Pt will be able to internally and externally rotate L shoulder without hiking left shoulder    Time  4    Period  Weeks    Status  New    Target Date  07/04/19      PT LONG TERM GOAL #2   Title  Pt will be independent in self MLD for RUE to allow long term management of lymphedema    Time  4    Period  Weeks    Status  New    Target Date  07/04/19      PT LONG TERM GOAL #3   Title  Pt will report a 75% improvement in tightness in R axilla and pec with R overhead motion to allow improved comfort    Time  4    Period  Weeks    Status  New    Target Date  07/04/19      PT LONG TERM GOAL #4   Title  Pt will obtain appropriate compression garments for long term management of lymphedema    Time  4    Period  Weeks    Status  New     Target Date  07/04/19      PT LONG TERM GOAL #5   Title  Pt will be independent in a home exercise program for continued strengthening and stretching    Time  4    Period  Weeks    Status  New    Target Date  07/04/19  Plan - 06/06/19 8628    Clinical Impression Statement  Pt has been having shoulder discomfort in area of bilateral upper traps and feels it may be due to how she is sleeping. Educated pt to try sleeping on 1 pillow instead of 3 for better alignment of spine. Continued with MLD and bandaging today. Pt to be measured for compression garments on Aug 17.    Rehab Potential  Good    PT Frequency  3x / week    PT Duration  6 weeks    PT Treatment/Interventions  ADLs/Self Care Home Management;Therapeutic exercise;Patient/family education;Manual techniques;Taping;Manual lymph drainage;Scar mobilization;Compression bandaging;Passive range of motion;Vasopneumatic Device;Joint Manipulations    PT Next Visit Plan  continue MLD to RUE and instruct/issue handout, cont compression bandaging and at some pt teach a family memeber to bandage to decrease to 2x/wk, PROM to bilateral shoulders, scap mobs to L, scar massage and myofascial to R pec    Consulted and Agree with Plan of Care  Patient       Patient will benefit from skilled therapeutic intervention in order to improve the following deficits and impairments:  Decreased range of motion, Decreased scar mobility, Decreased strength, Pain, Postural dysfunction, Increased edema  Visit Diagnosis: 1. Postmastectomy lymphedema        Problem List Patient Active Problem List   Diagnosis Date Noted  . Cancer of right breast, stage 2 ([place]) 01/22/2018  . Genetic testing 08/18/2017  . BRCA2 positive 08/18/2017  . Family history of breast cancer   . Family history of ovarian cancer   . Family history of prostate cancer   . Malignant neoplasm of upper-outer quadrant of right breast in female, estrogen receptor negative  ([place]) 07/25/2017  . Perforated bowel ([place]) 05/13/2015  . Free intraperitoneal air 05/12/2015  . Submucosal lesion of esophagus 10/06/2014  . 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 Diabetes ([place]) 09/24/2014  . Morbid obesity ([place]) 03/20/2014    Allyson Sabal [facility] 06/06/2019, 9:24 AM  [place], [place], [address] Phone: [phone]   Fax:  [phone]  Name: ADDY MCMANNIS MRN: [id number] Date of Birth: Feb 28, 1966  Manus Gunning, PT 06/06/19 9:24 AM

## 2019-06-07 ENCOUNTER — Encounter

## 2019-06-07 ENCOUNTER — Encounter: Payer: Self-pay | Admitting: Physical Therapy

## 2019-06-07 ENCOUNTER — Ambulatory Visit: Payer: 59 | Admitting: Physical Therapy

## 2019-06-07 ENCOUNTER — Other Ambulatory Visit: Payer: Self-pay

## 2019-06-07 DIAGNOSIS — I972 Postmastectomy lymphedema syndrome: Secondary | ICD-10-CM

## 2019-06-07 NOTE — Therapy (Signed)
[place], [place], [address] Phone: [phone]   Fax:  [phone]  Physical Therapy Treatment  Patient Details  Name: Kara Crawford MRN: [id number] Date of Birth: 1966/04/07 Referring Provider (PT): Dr. Theadore Nan   Encounter Date: 06/07/2019  PT End of Session - 06/07/19 1124    Visit Number  8    Number of Visits  13    Date for PT Re-Evaluation  07/04/19    PT Start Time  4825    PT Stop Time  1120    PT Time Calculation (min)  40 min    Activity Tolerance  Patient tolerated treatment well    Behavior During Therapy  [facility] for tasks assessed/performed       Past Medical History:  Diagnosis Date  . Anemia    anemia prior to uterine emboliztion procedure.  . Breast cancer ([place])   . Diabetes mellitus without complication ([place])    type 2  . Family history of breast cancer   . Family history of ovarian cancer   . Family history of prostate cancer   . Fibroids   . Headache   . Heart murmur   . History of radiation therapy 03/07/18- 04/18/18   Right Chest wall 50 Gy in 25 fractions, Right supraclavicular fossa 50 Gy in 25 fractions, Right Chest wall scar boost 10 Gy in 5 fractions.   . Hyperlipidemia   . Hypertension     Past Surgical History:  Procedure Laterality Date  . BREAST SURGERY     double mastectomy  . BREATH TEK H PYLORI N/A 04/17/2014   Procedure: BREATH TEK H PYLORI;  Surgeon: Shann Medal, MD;  Location: [facility];  Service: General;  Laterality: N/A;  . CESAREAN SECTION     x3- normal development  . CYSTOSCOPY N/A 07/18/2018   Procedure: CYSTOSCOPY;  Surgeon: Christophe Louis, MD;  Location: [facility];  Service: Gynecology;  Laterality: N/A;  . ESOPHAGOGASTRODUODENOSCOPY N/A 08/20/2015   Procedure: ESOPHAGOGASTRODUODENOSCOPY (EGD);  Surgeon: Alphonsa Overall, MD;  Location: [facility];  Service: General;  Laterality: N/A;  . EXPLORATORY LAPAROTOMY  05/13/2015  . GASTRIC ROUX-EN-Y  N/A 10/06/2014   Procedure: LAPAROSCOPIC ROUX-EN-Y GASTRIC BYPASS WITH UPPER ENDOSCOPY;  Surgeon: Pedro Earls, MD;  [facility];  Service: General;  Laterality: N/A;  . LAPAROSCOPIC SALPINGO OOPHERECTOMY Bilateral 07/18/2018   Procedure: LAPAROSCOPIC SALPINGO OOPHORECTOMY;  Surgeon: Christophe Louis, MD;  Location: [facility];  Service: Gynecology;  Laterality: Bilateral;  possible laparotomy with abdominal bilateral salpingoophorectomy  . LAPAROSCOPY N/A 05/12/2015   Procedure: LAPAROSCOPY REPAIR OF PERFORATED BOWEL;  Surgeon: Alphonsa Overall, MD;  Location: [facility];  Service: General;  Laterality: N/A;  . MASTECTOMY WITH RADIOACTIVE SEED GUIDED EXCISION AND AXILLARY SENTINEL LYMPH NODE BIOPSY Bilateral 01/22/2018   Procedure: BILATERAL MASTECTOMIES WITH RADIOACTIVE SEED TARGETED RIGHT AXILLARY [place] NODE EXCISION AND RIGHT SENTINEL LYMPH NODE BIOPSY;  Surgeon: Alphonsa Overall, MD;  Location: [facility];  Service: General;  Laterality: Bilateral;  . PORT-A-CATH REMOVAL Left 01/22/2018   Procedure: REMOVAL PORT-A-CATH;  Surgeon: Alphonsa Overall, MD;  Location: [facility];  Service: General;  Laterality: Left;  . PORTACATH PLACEMENT N/A 08/02/2017   Procedure: INSERTION PORT-A-CATH;  Surgeon: Alphonsa Overall, MD;  Location: [facility];  Service: General;  Laterality: N/A;  . UTERINE ARTERY EMBOLIZATION  08-16-2010    There were no vitals filed for this visit.  Subjective Assessment - 06/07/19 1041    Subjective  The  bandages did well. I slept on 1 pillow last night. I didn't have pain like I have been.    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 It measures 1 cm and is located in the upper outer quadrant with a Ki67 of 90%. She had 3 abnormal looking axillary nodes on ultrasound and 1 was biopsied and found to be positive, 01/22/18- underwent bilateral mastectomy and a SLNB on the right side with 6/6 were negative, pt has  completed chemotherapy and radiation.  Has been seen here previously for lymphedema and shoulder ROM.      Patient Stated Goals  to get the swelling down    Currently in Pain?  No/denies    Pain Score  0-No pain                  Outpatient Re[facility] from [facility]/23/[facility]  Lymphedema Life Impact Scale Total Score  5.88 %           [facility] Adult PT Treatment/Exercise - 06/07/19 0001      Manual Therapy   Manual Lymphatic Drainage (MLD)  short neck, 5 diaphragmatic breaths left axillary nodes and establishment of interaxillary pathway, right inguinal nodes and establishment of axillo inguinal pathway, right UE working proximal to distal then retracing all steps   pt required v/c for diaphragmatic breathing   Compression Bandaging  thick stockinette, elastomull to fingers 1-5, 1/2 gray foam to back of hand and antecubital fossa, 1 artiflex from wrist to axilla, 1 6cm, 1 8cm and 2 12 cm bandages from hand to axilla (1st 12 cm bandage in herringbone)                  PT Long Term Goals - 05/23/19 3762      PT LONG TERM GOAL #1   Title  Pt will be able to internally and externally rotate L shoulder without hiking left shoulder    Time  4    Period  Weeks    Status  New    Target Date  07/04/19      PT LONG TERM GOAL #2   Title  Pt will be independent in self MLD for RUE to allow long term management of lymphedema    Time  4    Period  Weeks    Status  New    Target Date  07/04/19      PT LONG TERM GOAL #3   Title  Pt will report a 75% improvement in tightness in R axilla and pec with R overhead motion to allow improved comfort    Time  4    Period  Weeks    Status  New    Target Date  07/04/19      PT LONG TERM GOAL #4   Title  Pt will obtain appropriate compression garments for long term management of lymphedema    Time  4    Period  Weeks    Status  New    Target Date  07/04/19      PT LONG TERM GOAL #5    Title  Pt will be independent in a home exercise program for continued strengthening and stretching    Time  4    Period  Weeks    Status  New    Target Date  07/04/19  Plan - 06/07/19 1124    Clinical Impression Statement  Pt did not have any discomfort from bandaging. Continued with MLD and bandaging. Pt has started sleeping on 1 pillow and it seems to be helping her shoulder pain. Will continue completed CDT until she reaches maximal reduction.    Stability/Clinical Decision Making  Stable/Uncomplicated    Rehab Potential  Good    PT Frequency  3x / week    PT Duration  6 weeks    PT Treatment/Interventions  ADLs/Self Care Home Management;Therapeutic exercise;Patient/family education;Manual techniques;Taping;Manual lymph drainage;Scar mobilization;Compression bandaging;Passive range of motion;Vasopneumatic Device;Joint Manipulations    PT Next Visit Plan  continue MLD to RUE and instruct/issue handout, cont compression bandaging and at some pt teach a family memeber to bandage to decrease to 2x/wk, PROM to bilateral shoulders, scap mobs to L, scar massage and myofascial to R pec    Consulted and Agree with Plan of Care  Patient       Patient will benefit from skilled therapeutic intervention in order to improve the following deficits and impairments:  Decreased range of motion, Decreased scar mobility, Decreased strength, Pain, Postural dysfunction, Increased edema  Visit Diagnosis: 1. Postmastectomy lymphedema        Problem List Patient Active Problem List   Diagnosis Date Noted  . Cancer of right breast, stage 2 ([place]) 01/22/2018  . Genetic testing 08/18/2017  . BRCA2 positive 08/18/2017  . Family history of breast cancer   . Family history of ovarian cancer   . Family history of prostate cancer   . Malignant neoplasm of upper-outer quadrant of right breast in female, estrogen receptor negative ([place]) 07/25/2017  . Perforated bowel ([place]) 05/13/2015  . Free  intraperitoneal air 05/12/2015  . Submucosal lesion of esophagus 10/06/2014  . 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 Diabetes ([place]) 09/24/2014  . Morbid obesity ([place]) 03/20/2014    Allyson Sabal [facility] 06/07/2019, 11:26 AM  [place], [place], [address] Phone: [phone]   Fax:  [phone]  Name: Kara Crawford MRN: [id number] Date of Birth: 05-Nov-1965  Manus Gunning, PT 06/07/19 11:26 AM

## 2019-06-11 ENCOUNTER — Other Ambulatory Visit: Payer: Self-pay

## 2019-06-11 ENCOUNTER — Encounter: Payer: Self-pay | Admitting: Physical Therapy

## 2019-06-11 ENCOUNTER — Ambulatory Visit: Payer: 59 | Admitting: Physical Therapy

## 2019-06-11 DIAGNOSIS — I972 Postmastectomy lymphedema syndrome: Secondary | ICD-10-CM

## 2019-06-11 NOTE — Therapy (Signed)
[place], [place], [address] Phone: [phone]   Fax:  [phone]  Physical Therapy Treatment  Patient Details  Name: Kara Crawford MRN: [id number] Date of Birth: 1966/08/26 Referring Provider (PT): Dr. Theadore Nan   Encounter Date: 06/11/2019  PT End of Session - 06/11/19 1352    Visit Number  9    Number of Visits  13    Date for PT Re-Evaluation  07/04/19    PT Start Time  1309    PT Stop Time  1350    PT Time Calculation (min)  41 min    Activity Tolerance  Patient tolerated treatment well    Behavior During Therapy  [facility] for tasks assessed/performed       Past Medical History:  Diagnosis Date  . Anemia    anemia prior to uterine emboliztion procedure.  . Breast cancer ([place])   . Diabetes mellitus without complication ([place])    type 2  . Family history of breast cancer   . Family history of ovarian cancer   . Family history of prostate cancer   . Fibroids   . Headache   . Heart murmur   . History of radiation therapy 03/07/18- 04/18/18   Right Chest wall 50 Gy in 25 fractions, Right supraclavicular fossa 50 Gy in 25 fractions, Right Chest wall scar boost 10 Gy in 5 fractions.   . Hyperlipidemia   . Hypertension     Past Surgical History:  Procedure Laterality Date  . BREAST SURGERY     double mastectomy  . BREATH TEK H PYLORI N/A 04/17/2014   Procedure: BREATH TEK H PYLORI;  Surgeon: Shann Medal, MD;  Location: [facility];  Service: General;  Laterality: N/A;  . CESAREAN SECTION     x3- normal development  . CYSTOSCOPY N/A 07/18/2018   Procedure: CYSTOSCOPY;  Surgeon: Christophe Louis, MD;  Location: [facility];  Service: Gynecology;  Laterality: N/A;  . ESOPHAGOGASTRODUODENOSCOPY N/A 08/20/2015   Procedure: ESOPHAGOGASTRODUODENOSCOPY (EGD);  Surgeon: Alphonsa Overall, MD;  Location: [facility];  Service: General;  Laterality: N/A;  . EXPLORATORY LAPAROTOMY  05/13/2015  . GASTRIC  ROUX-EN-Y N/A 10/06/2014   Procedure: LAPAROSCOPIC ROUX-EN-Y GASTRIC BYPASS WITH UPPER ENDOSCOPY;  Surgeon: Pedro Earls, MD;  Location: [facility];  Service: General;  Laterality: N/A;  . LAPAROSCOPIC SALPINGO OOPHERECTOMY Bilateral 07/18/2018   Procedure: LAPAROSCOPIC SALPINGO OOPHORECTOMY;  Surgeon: Christophe Louis, MD;  Location: [facility];  Service: Gynecology;  Laterality: Bilateral;  possible laparotomy with abdominal bilateral salpingoophorectomy  . LAPAROSCOPY N/A 05/12/2015   Procedure: LAPAROSCOPY REPAIR OF PERFORATED BOWEL;  Surgeon: Alphonsa Overall, MD;  Location: [place];  Service: General;  Laterality: N/A;  . MASTECTOMY WITH RADIOACTIVE SEED GUIDED EXCISION AND AXILLARY SENTINEL LYMPH NODE BIOPSY Bilateral 01/22/2018   Procedure: BILATERAL MASTECTOMIES WITH RADIOACTIVE SEED TARGETED RIGHT AXILLARY [place] NODE EXCISION AND RIGHT SENTINEL LYMPH NODE BIOPSY;  Surgeon: Alphonsa Overall, MD;  Location: [place];  Service: General;  Laterality: Bilateral;  . PORT-A-CATH REMOVAL Left 01/22/2018   Procedure: REMOVAL PORT-A-CATH;  Surgeon: Alphonsa Overall, MD;  Location: [place];  Service: General;  Laterality: Left;  . PORTACATH PLACEMENT N/A 08/02/2017   Procedure: INSERTION PORT-A-CATH;  Surgeon: Alphonsa Overall, MD;  Location: [place];  Service: General;  Laterality: N/A;  . UTERINE ARTERY EMBOLIZATION  08-16-2010    There were no vitals filed for this visit.  Subjective Assessment - 06/11/19 1311    Subjective  The  bandages did good. I took them off today because I went to workout and had to take a shower.    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 It measures 1 cm and is located in the upper outer quadrant with a Ki67 of 90%. She had 3 abnormal looking axillary nodes on ultrasound and 1 was biopsied and found to be positive, 01/22/18- underwent bilateral mastectomy and a SLNB on the right side with 6/6 were  negative, pt has completed chemotherapy and radiation.  Has been seen here previously for lymphedema and shoulder ROM.      Patient Stated Goals  to get the swelling down    Currently in Pain?  No/denies    Pain Score  0-No pain                  [facility]  Lymphedema Life Impact Scale Total Score  5.88 %           [facility] Adult PT Treatment/Exercise - 06/11/19 0001      Manual Therapy   Manual Lymphatic Drainage (MLD)  short neck, 5 diaphragmatic breaths left axillary nodes and establishment of interaxillary pathway, right inguinal nodes and establishment of axillo inguinal pathway, right UE working proximal to distal then retracing all steps   pt required v/c for diaphragmatic breathing   Compression Bandaging  thick stockinette, elastomull to fingers 1-5, 1/2 gray foam to back of hand and antecubital fossa, 1 artiflex from wrist to axilla, 1 6cm, 1 8cm and 2 12 cm bandages from hand to axilla (1st 12 cm bandage in herringbone)                  PT Long Term Goals - 05/23/19 8937      PT LONG TERM GOAL #1   Title  Pt will be able to internally and externally rotate L shoulder without hiking left shoulder    Time  4    Period  Weeks    Status  New    Target Date  07/04/19      PT LONG TERM GOAL #2   Title  Pt will be independent in self MLD for RUE to allow long term management of lymphedema    Time  4    Period  Weeks    Status  New    Target Date  07/04/19      PT LONG TERM GOAL #3   Title  Pt will report a 75% improvement in tightness in R axilla and pec with R overhead motion to allow improved comfort    Time  4    Period  Weeks    Status  New    Target Date  07/04/19      PT LONG TERM GOAL #4   Title  Pt will obtain appropriate compression garments for long term management of lymphedema    Time  4    Period  Weeks    Status  New    Target Date  07/04/19      PT LONG  TERM GOAL #5   Title  Pt will be independent in a home exercise program for continued strengthening and stretching    Time  4    Period  Weeks    Status  New    Target Date  07/04/19  Plan - 06/11/19 1353    Clinical Impression Statement  Pt states since she has been sleeping on one pillow she is no longer having shoulder pain. Continued with MLD and bandaging and once pt reaches maximal reduction she will be ready to be measured for compression garments. She is supposed to be measured on Monday.    PT Frequency  3x / week    PT Duration  6 weeks    PT Treatment/Interventions  ADLs/Self Care Home Management;Therapeutic exercise;Patient/family education;Manual techniques;Taping;Manual lymph drainage;Scar mobilization;Compression bandaging;Passive range of motion;Vasopneumatic Device;Joint Manipulations    PT Next Visit Plan  continue MLD to RUE and instruct/issue handout, cont compression bandaging and at some pt teach a family memeber to bandage to decrease to 2x/wk, PROM to bilateral shoulders, scap mobs to L, scar massage and myofascial to R pec    Consulted and Agree with Plan of Care  Patient       Patient will benefit from skilled therapeutic intervention in order to improve the following deficits and impairments:  Decreased range of motion, Decreased scar mobility, Decreased strength, Pain, Postural dysfunction, Increased edema  Visit Diagnosis: 1. Postmastectomy lymphedema        Problem List Patient Active Problem List   Diagnosis Date Noted  . Cancer of right breast, stage 2 ([place]) 01/22/2018  . Genetic testing 08/18/2017  . BRCA2 positive 08/18/2017  . Family history of breast cancer   . Family history of ovarian cancer   . Family history of prostate cancer   . Malignant neoplasm of upper-outer quadrant of right breast in female, estrogen receptor negative ([place]) 07/25/2017  . Perforated bowel ([place]) 05/13/2015  . Free intraperitoneal air 05/12/2015  .  Submucosal lesion of esophagus 10/06/2014  . 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 Diabetes ([place]) 09/24/2014  . Morbid obesity ([place]) 03/20/2014    Allyson Sabal [facility] 06/11/2019, 1:54 PM  [place], [place], [address] Phone: [phone]   Fax:  [phone]  Name: Kara Crawford MRN: [id number] Date of Birth: 11/06/1965  Manus Gunning, PT 06/11/19 1:55 PM

## 2019-06-13 ENCOUNTER — Ambulatory Visit: Payer: 59 | Admitting: Physical Therapy

## 2019-06-13 ENCOUNTER — Other Ambulatory Visit: Payer: Self-pay

## 2019-06-13 ENCOUNTER — Encounter: Payer: Self-pay | Admitting: Physical Therapy

## 2019-06-13 DIAGNOSIS — I972 Postmastectomy lymphedema syndrome: Secondary | ICD-10-CM | POA: Diagnosis not present

## 2019-06-13 NOTE — Therapy (Signed)
[place], [place], [address] Phone: [phone]   Fax:  [phone]  Physical Therapy Treatment  Patient Details  Name: Kara Crawford MRN: [id number] Date of Birth: Mar 12, 1966 Referring Provider (PT): Dr. Theadore Nan   Encounter Date: 06/13/2019  PT End of Session - 06/13/19 0916    Visit Number  10    Number of Visits  13    Date for PT Re-Evaluation  07/04/19    PT Start Time  0835    PT Stop Time  0915    PT Time Calculation (min)  40 min    Activity Tolerance  Patient tolerated treatment well    Behavior During Therapy  [facility] for tasks assessed/performed       Past Medical History:  Diagnosis Date  . Anemia    anemia prior to uterine emboliztion procedure.  . Breast cancer ([place])   . Diabetes mellitus without complication ([place])    type 2  . Family history of breast cancer   . Family history of ovarian cancer   . Family history of prostate cancer   . Fibroids   . Headache   . Heart murmur   . History of radiation therapy 03/07/18- 04/18/18   Right Chest wall 50 Gy in 25 fractions, Right supraclavicular fossa 50 Gy in 25 fractions, Right Chest wall scar boost 10 Gy in 5 fractions.   . Hyperlipidemia   . Hypertension     Past Surgical History:  Procedure Laterality Date  . BREAST SURGERY     double mastectomy  . BREATH TEK H PYLORI N/A 04/17/2014   Procedure: BREATH TEK H PYLORI;  Surgeon: Shann Medal, MD;  Location: [facility];  Service: General;  Laterality: N/A;  . CESAREAN SECTION     x3- normal development  . CYSTOSCOPY N/A 07/18/2018   Procedure: CYSTOSCOPY;  Surgeon: Christophe Louis, MD;  Location: [facility];  Service: Gynecology;  Laterality: N/A;  . ESOPHAGOGASTRODUODENOSCOPY N/A 08/20/2015   Procedure: ESOPHAGOGASTRODUODENOSCOPY (EGD);  Surgeon: Alphonsa Overall, MD;  Location: [facility];  Service: General;  Laterality: N/A;  . EXPLORATORY LAPAROTOMY  05/13/2015  . GASTRIC  ROUX-EN-Y N/A 10/06/2014   Procedure: LAPAROSCOPIC ROUX-EN-Y GASTRIC BYPASS WITH UPPER ENDOSCOPY;  Surgeon: Pedro Earls, MD;  [facility];  Service: General;  Laterality: N/A;  . LAPAROSCOPIC SALPINGO OOPHERECTOMY Bilateral 07/18/2018   Procedure: LAPAROSCOPIC SALPINGO OOPHORECTOMY;  Surgeon: Christophe Louis, MD;  Location: [facility];  Service: Gynecology;  Laterality: Bilateral;  possible laparotomy with abdominal bilateral salpingoophorectomy  . LAPAROSCOPY N/A 05/12/2015   Procedure: LAPAROSCOPY REPAIR OF PERFORATED BOWEL;  Surgeon: Alphonsa Overall, MD;  Location: [place];  Service: General;  Laterality: N/A;  . MASTECTOMY WITH RADIOACTIVE SEED GUIDED EXCISION AND AXILLARY SENTINEL LYMPH NODE BIOPSY Bilateral 01/22/2018   Procedure: BILATERAL MASTECTOMIES WITH RADIOACTIVE SEED TARGETED RIGHT AXILLARY [place] NODE EXCISION AND RIGHT SENTINEL LYMPH NODE BIOPSY;  Surgeon: Alphonsa Overall, MD;  Location: [place];  Service: General;  Laterality: Bilateral;  . PORT-A-CATH REMOVAL Left 01/22/2018   Procedure: REMOVAL PORT-A-CATH;  Surgeon: Alphonsa Overall, MD;  Location: [place];  Service: General;  Laterality: Left;  . PORTACATH PLACEMENT N/A 08/02/2017   Procedure: INSERTION PORT-A-CATH;  Surgeon: Alphonsa Overall, MD;  Location: [place];  Service: General;  Laterality: N/A;  . UTERINE ARTERY EMBOLIZATION  08-16-2010    There were no vitals filed for this visit.  Subjective Assessment - 06/13/19 0836    Subjective  The  bandages did good.    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 It measures 1 cm and is located in the upper outer quadrant with a Ki67 of 90%. She had 3 abnormal looking axillary nodes on ultrasound and 1 was biopsied and found to be positive, 01/22/18- underwent bilateral mastectomy and a SLNB on the right side with 6/6 were negative, pt has completed chemotherapy and radiation.  Has been seen here  previously for lymphedema and shoulder ROM.      Patient Stated Goals  to get the swelling down    Currently in Pain?  No/denies    Pain Score  0-No pain            LYMPHEDEMA/ONCOLOGY QUESTIONNAIRE - 06/13/19 0837      Right Upper Extremity Lymphedema   15 cm Proximal to Olecranon Process  34.2 cm    Olecranon Process  29 cm    15 cm Proximal to Ulnar Styloid Process  29 cm    Just Proximal to Ulnar Styloid Process  18.6 cm    Across Hand at [employer]  20.5 cm    At [facility] of 2nd Digit  6.6 cm           Outpatient [facility]  Lymphedema Life Impact Scale Total Score  5.88 %           [facility] Adult PT Treatment/Exercise - 06/13/19 0001      Manual Therapy   Manual Lymphatic Drainage (MLD)  short neck, 5 diaphragmatic breaths left axillary nodes and establishment of interaxillary pathway, right inguinal nodes and establishment of axillo inguinal pathway, right UE working proximal to distal then retracing all steps    Compression Bandaging  thick stockinette, elastomull to fingers 1-5, 1/2 gray foam to back of hand and antecubital fossa, 1 artiflex from wrist to axilla, 1 6cm, 1 8cm and 2 12 cm bandages from hand to axilla (1st 12 cm bandage in herringbone)                  PT Long Term Goals - 05/23/19 0370      PT LONG TERM GOAL #1   Title  Pt will be able to internally and externally rotate L shoulder without hiking left shoulder    Time  4    Period  Weeks    Status  New    Target Date  07/04/19      PT LONG TERM GOAL #2   Title  Pt will be independent in self MLD for RUE to allow long term management of lymphedema    Time  4    Period  Weeks    Status  New    Target Date  07/04/19      PT LONG TERM GOAL #3   Title  Pt will report a 75% improvement in tightness in R axilla and pec with R overhead motion to allow improved comfort    Time  4    Period  Weeks    Status  New     Target Date  07/04/19      PT LONG TERM GOAL #4   Title  Pt will obtain appropriate compression garments for long term management of lymphedema    Time  4    Period  Weeks    Status  New    Target Date  07/04/19  PT LONG TERM GOAL #5   Title  Pt will be independent in a home exercise program for continued strengthening and stretching    Time  4    Period  Weeks    Status  New    Target Date  07/04/19            Plan - 06/13/19 0917    Clinical Impression Statement  Took circumference measurements today. Pt's seems to have reached maximal reduction and will be measured for compression garments on Monday. Will continue complete CDT until her compression garments arrive so her arm does not swell back up.    Stability/Clinical Decision Making  Stable/Uncomplicated    Rehab Potential  Good    PT Frequency  3x / week    PT Duration  6 weeks    PT Treatment/Interventions  ADLs/Self Care Home Management;Therapeutic exercise;Patient/family education;Manual techniques;Taping;Manual lymph drainage;Scar mobilization;Compression bandaging;Passive range of motion;Vasopneumatic Device;Joint Manipulations    PT Next Visit Plan  continue MLD to RUE and instruct/issue handout, cont compression bandaging and at some pt teach a family memeber to bandage to decrease to 2x/wk, PROM to bilateral shoulders, scap mobs to L, scar massage and myofascial to R pec    Consulted and Agree with Plan of Care  Patient       Patient will benefit from skilled therapeutic intervention in order to improve the following deficits and impairments:  Decreased range of motion, Decreased scar mobility, Decreased strength, Pain, Postural dysfunction, Increased edema  Visit Diagnosis: 1. Postmastectomy lymphedema        Problem List Patient Active Problem List   Diagnosis Date Noted  . Cancer of right breast, stage 2 ([place]) 01/22/2018  . Genetic testing 08/18/2017  . BRCA2 positive 08/18/2017  . Family  history of breast cancer   . Family history of ovarian cancer   . Family history of prostate cancer   . Malignant neoplasm of upper-outer quadrant of right breast in female, estrogen receptor negative ([place]) 07/25/2017  . Perforated bowel ([place]) 05/13/2015  . Free intraperitoneal air 05/12/2015  . Submucosal lesion of esophagus 10/06/2014  . 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 Diabetes ([place]) 09/24/2014  . Morbid obesity ([place]) 03/20/2014    Allyson Sabal [facility] 06/13/2019, 9:19 AM  [place], [place], [address] Phone: [phone]   Fax:  [phone]  Name: Kara Crawford MRN: [id number] Date of Birth: 1966/07/31  Manus Gunning, PT 06/13/19 9:19 AM

## 2019-06-14 ENCOUNTER — Ambulatory Visit: Payer: 59 | Admitting: Physical Therapy

## 2019-06-14 ENCOUNTER — Encounter: Payer: Self-pay | Admitting: Physical Therapy

## 2019-06-14 DIAGNOSIS — I972 Postmastectomy lymphedema syndrome: Secondary | ICD-10-CM | POA: Diagnosis not present

## 2019-06-14 NOTE — Therapy (Signed)
[place], [place], [address] Phone: [phone]   Fax:  [phone]  Physical Therapy Treatment  Patient Details  Name: Kara Crawford MRN: [id number] Date of Birth: [birth year] Referring Provider (PT): Dr. Theadore Nan   Encounter Date: 06/14/2019  PT End of Session - 06/14/19 1022    Visit Number  11    Number of Visits  13    Date for PT Re-Evaluation  07/04/19    PT Start Time  0927    PT Stop Time  1015    PT Time Calculation (min)  48 min    Activity Tolerance  Patient tolerated treatment well    Behavior During Therapy  [facility] for tasks assessed/performed       Past Medical History:  Diagnosis Date  . Anemia    anemia prior to uterine emboliztion procedure.  . Breast cancer ([place])   . Diabetes mellitus without complication ([place])    type 2  . Family history of breast cancer   . Family history of ovarian cancer   . Family history of prostate cancer   . Fibroids   . Headache   . Heart murmur   . History of radiation therapy 03/07/18- 04/18/18   Right Chest wall 50 Gy in 25 fractions, Right supraclavicular fossa 50 Gy in 25 fractions, Right Chest wall scar boost 10 Gy in 5 fractions.   . Hyperlipidemia   . Hypertension     Past Surgical History:  Procedure Laterality Date  . BREAST SURGERY     double mastectomy  . BREATH TEK H PYLORI N/A 04/17/2014   Procedure: BREATH TEK H PYLORI;  Surgeon: Shann Medal, MD;  Location: [facility];  Service: General;  Laterality: N/A;  . CESAREAN SECTION     x3- normal development  . CYSTOSCOPY N/A 07/18/2018   Procedure: CYSTOSCOPY;  Surgeon: Christophe Louis, MD;  Location: [facility];  Service: Gynecology;  Laterality: N/A;  . ESOPHAGOGASTRODUODENOSCOPY N/A 08/20/2015   Procedure: ESOPHAGOGASTRODUODENOSCOPY (EGD);  Surgeon: Alphonsa Overall, MD;  Location: [facility];  Service: General;  Laterality: N/A;  . EXPLORATORY LAPAROTOMY  05/13/2015  . GASTRIC  ROUX-EN-Y N/A 10/06/2014   Procedure: LAPAROSCOPIC ROUX-EN-Y GASTRIC BYPASS WITH UPPER ENDOSCOPY;  Surgeon: Pedro Earls, MD;  Location: [facility];  Service: General;  Laterality: N/A;  . LAPAROSCOPIC SALPINGO OOPHERECTOMY Bilateral 07/18/2018   Procedure: LAPAROSCOPIC SALPINGO OOPHORECTOMY;  Surgeon: Christophe Louis, MD;  Location: [facility];  Service: Gynecology;  Laterality: Bilateral;  possible laparotomy with abdominal bilateral salpingoophorectomy  . LAPAROSCOPY N/A 05/12/2015   Procedure: LAPAROSCOPY REPAIR OF PERFORATED BOWEL;  Surgeon: Alphonsa Overall, MD;  Location: [facility];  Service: General;  Laterality: N/A;  . MASTECTOMY WITH RADIOACTIVE SEED GUIDED EXCISION AND AXILLARY SENTINEL LYMPH NODE BIOPSY Bilateral 01/22/2018   Procedure: BILATERAL MASTECTOMIES WITH RADIOACTIVE SEED TARGETED RIGHT AXILLARY [place] NODE EXCISION AND RIGHT SENTINEL LYMPH NODE BIOPSY;  Surgeon: Alphonsa Overall, MD;  Location: [facility];  Service: General;  Laterality: Bilateral;  . PORT-A-CATH REMOVAL Left 01/22/2018   Procedure: REMOVAL PORT-A-CATH;  Surgeon: Alphonsa Overall, MD;  Location: [facility];  Service: General;  Laterality: Left;  . PORTACATH PLACEMENT N/A 08/02/2017   Procedure: INSERTION PORT-A-CATH;  Surgeon: Alphonsa Overall, MD;  Location: [facility];  Service: General;  Laterality: N/A;  . UTERINE ARTERY EMBOLIZATION  08-16-2010    There were no vitals filed for this visit.  Subjective Assessment - 06/14/19 0927    Subjective  The  bandages did well.    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 It measures 1 cm and is located in the upper outer quadrant with a Ki67 of 90%. She had 3 abnormal looking axillary nodes on ultrasound and 1 was biopsied and found to be positive, 01/22/18- underwent bilateral mastectomy and a SLNB on the right side with 6/6 were negative, pt has completed chemotherapy and radiation.  Has been seen here  previously for lymphedema and shoulder ROM.      Patient Stated Goals  to get the swelling down    Currently in Pain?  No/denies    Pain Score  0-No pain            LYMPHEDEMA/ONCOLOGY QUESTIONNAIRE - 06/14/19 0927      Right Upper Extremity Lymphedema   15 cm Proximal to Olecranon Process  33.6 cm    Olecranon Process  28 cm    15 cm Proximal to Ulnar Styloid Process  29 cm    Just Proximal to Ulnar Styloid Process  19.6 cm    Across Hand at [employer]  20 cm    At [place] of 2nd Digit  6.5 cm           [facility]  Lymphedema Life Impact Scale Total Score  5.88 %           [facility] Adult PT Treatment/Exercise - 06/14/19 0001      Manual Therapy   Manual Lymphatic Drainage (MLD)  short neck, 5 diaphragmatic breaths left axillary nodes and establishment of interaxillary pathway, right inguinal nodes and establishment of axillo inguinal pathway, right UE working proximal to distal then retracing all steps  (Pended)     Compression Bandaging  thick stockinette, elastomull to fingers 1-5, 1/2 gray foam to back of hand and antecubital fossa, 1 artiflex from wrist to axilla, 1 6cm, 1 8cm and 2 12 cm bandages from hand to axilla (1st 12 cm bandage in herringbone)  (Pended)                   PT Long Term Goals - 05/23/19 3149      PT LONG TERM GOAL #1   Title  Pt will be able to internally and externally rotate L shoulder without hiking left shoulder    Time  4    Period  Weeks    Status  New    Target Date  07/04/19      PT LONG TERM GOAL #2   Title  Pt will be independent in self MLD for RUE to allow long term management of lymphedema    Time  4    Period  Weeks    Status  New    Target Date  07/04/19      PT LONG TERM GOAL #3   Title  Pt will report a 75% improvement in tightness in R axilla and pec with R overhead motion to allow improved comfort    Time  4    Period  Weeks     Status  New    Target Date  07/04/19      PT LONG TERM GOAL #4   Title  Pt will obtain appropriate compression garments for long term management of lymphedema    Time  4    Period  Weeks    Status  New  Target Date  07/04/19      PT LONG TERM GOAL #5   Title  Pt will be independent in a home exercise program for continued strengthening and stretching    Time  4    Period  Weeks    Status  New    Target Date  07/04/19            Plan - 06/14/19 1022    Clinical Impression Statement  Remeasured circumference again today and it is still stable. Pt did have some reduction at upper arm. Continued with complete CDT and pt will be measured for garments on Monday.    PT Frequency  3x / week    PT Duration  6 weeks    PT Treatment/Interventions  ADLs/Self Care Home Management;Therapeutic exercise;Patient/family education;Manual techniques;Taping;Manual lymph drainage;Scar mobilization;Compression bandaging;Passive range of motion;Vasopneumatic Device;Joint Manipulations    PT Next Visit Plan  continue MLD to RUE and instruct/issue handout, cont compression bandaging and at some pt teach a family memeber to bandage to decrease to 2x/wk, PROM to bilateral shoulders, scap mobs to L, scar massage and myofascial to R pec    Consulted and Agree with Plan of Care  Patient       Patient will benefit from skilled therapeutic intervention in order to improve the following deficits and impairments:  Decreased range of motion, Decreased scar mobility, Decreased strength, Pain, Postural dysfunction, Increased edema  Visit Diagnosis: 1. Postmastectomy lymphedema        Problem List Patient Active Problem List   Diagnosis Date Noted  . Cancer of right breast, stage 2 ([place]) 01/22/2018  . Genetic testing 08/18/2017  . BRCA2 positive 08/18/2017  . Family history of breast cancer   . Family history of ovarian cancer   . Family history of prostate cancer   . Malignant neoplasm of upper-outer  quadrant of right breast in female, estrogen receptor negative ([place]) 07/25/2017  . Perforated bowel ([place]) 05/13/2015  . Free intraperitoneal air 05/12/2015  . Submucosal lesion of esophagus 10/06/2014  . 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 Diabetes ([place]) 09/24/2014  . Morbid obesity ([place]) 03/20/2014    Allyson Sabal [facility] 06/14/2019, 10:23 AM  [place], [place], [address] Phone: [phone]   Fax:  [phone]  Name: Kara Crawford MRN: [id number] Date of Birth: Jun 18, 1966  Manus Gunning, PT 06/14/19 10:23 AM

## 2019-06-18 ENCOUNTER — Encounter: Payer: Self-pay | Admitting: Physical Therapy

## 2019-06-18 ENCOUNTER — Ambulatory Visit: Payer: 59 | Admitting: Physical Therapy

## 2019-06-18 ENCOUNTER — Other Ambulatory Visit: Payer: Self-pay

## 2019-06-18 DIAGNOSIS — I972 Postmastectomy lymphedema syndrome: Secondary | ICD-10-CM

## 2019-06-18 NOTE — Therapy (Signed)
[place], [place], [address] Phone: [phone]   Fax:  [phone]  Physical Therapy Treatment  Patient Details  Name: Kara Crawford MRN: [id number] Date of Birth: 10-Jul-1966 Referring Provider (PT): Dr. Theadore Nan   Encounter Date: 06/18/2019  PT End of Session - 06/18/19 0922    Visit Number  12    Number of Visits  13    Date for PT Re-Evaluation  07/04/19    PT Start Time  0840   pt arrived late   PT Stop Time  0919    PT Time Calculation (min)  39 min    Activity Tolerance  Patient tolerated treatment well    Behavior During Therapy  [facility] for tasks assessed/performed       Past Medical History:  Diagnosis Date  . Anemia    anemia prior to uterine emboliztion procedure.  . Breast cancer ([place])   . Diabetes mellitus without complication ([place])    type 2  . Family history of breast cancer   . Family history of ovarian cancer   . Family history of prostate cancer   . Fibroids   . Headache   . Heart murmur   . History of radiation therapy 03/07/18- 04/18/18   Right Chest wall 50 Gy in 25 fractions, Right supraclavicular fossa 50 Gy in 25 fractions, Right Chest wall scar boost 10 Gy in 5 fractions.   . Hyperlipidemia   . Hypertension     Past Surgical History:  Procedure Laterality Date  . BREAST SURGERY     double mastectomy  . BREATH TEK H PYLORI N/A 04/17/2014   Procedure: BREATH TEK H PYLORI;  Surgeon: Shann Medal, MD;  Location: [facility];  Service: General;  Laterality: N/A;  . CESAREAN SECTION     x3- normal development  . CYSTOSCOPY N/A 07/18/2018   Procedure: CYSTOSCOPY;  Surgeon: Christophe Louis, MD;  Location: [facility];  Service: Gynecology;  Laterality: N/A;  . ESOPHAGOGASTRODUODENOSCOPY N/A 08/20/2015   Procedure: ESOPHAGOGASTRODUODENOSCOPY (EGD);  Surgeon: Alphonsa Overall, MD;  Location: [facility];  Service: General;  Laterality: N/A;  . EXPLORATORY LAPAROTOMY  05/13/2015   . GASTRIC ROUX-EN-Y N/A 10/06/2014   Procedure: LAPAROSCOPIC ROUX-EN-Y GASTRIC BYPASS WITH UPPER ENDOSCOPY;  Surgeon: Pedro Earls, MD;  [facility];  Service: General;  Laterality: N/A;  . LAPAROSCOPIC SALPINGO OOPHERECTOMY Bilateral 07/18/2018   Procedure: LAPAROSCOPIC SALPINGO OOPHORECTOMY;  Surgeon: Christophe Louis, MD;  Location: [facility];  Service: Gynecology;  Laterality: Bilateral;  possible laparotomy with abdominal bilateral salpingoophorectomy  . LAPAROSCOPY N/A 05/12/2015   Procedure: LAPAROSCOPY REPAIR OF PERFORATED BOWEL;  Surgeon: Alphonsa Overall, MD;  Location: [facility];  Service: General;  Laterality: N/A;  . MASTECTOMY WITH RADIOACTIVE SEED GUIDED EXCISION AND AXILLARY SENTINEL LYMPH NODE BIOPSY Bilateral 01/22/2018   Procedure: BILATERAL MASTECTOMIES WITH RADIOACTIVE SEED TARGETED RIGHT AXILLARY [place] NODE EXCISION AND RIGHT SENTINEL LYMPH NODE BIOPSY;  Surgeon: Alphonsa Overall, MD;  Location: [facility];  Service: General;  Laterality: Bilateral;  . PORT-A-CATH REMOVAL Left 01/22/2018   Procedure: REMOVAL PORT-A-CATH;  Surgeon: Alphonsa Overall, MD;  Location: [facility];  Service: General;  Laterality: Left;  . PORTACATH PLACEMENT N/A 08/02/2017   Procedure: INSERTION PORT-A-CATH;  Surgeon: Alphonsa Overall, MD;  Location: [facility];  Service: General;  Laterality: N/A;  . UTERINE ARTERY EMBOLIZATION  08-16-2010    There were no vitals filed for this visit.  Subjective Assessment - 06/18/19 0840  Subjective  I got measured for garments yesterday.    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 It measures 1 cm and is located in the upper outer quadrant with a Ki67 of 90%. She had 3 abnormal looking axillary nodes on ultrasound and 1 was biopsied and found to be positive, 01/22/18- underwent bilateral mastectomy and a SLNB on the right side with 6/6 were negative, pt has completed chemotherapy and  radiation.  Has been seen here previously for lymphedema and shoulder ROM.      Patient Stated Goals  to get the swelling down    Currently in Pain?  No/denies    Pain Score  0-No pain                  Outpatient [facility]  Lymphedema Life Impact Scale Total Score  5.88 %           [facility] Adult PT Treatment/Exercise - 06/18/19 0001      Manual Therapy   Manual Lymphatic Drainage (MLD)  short neck, superficial and deep abdominals, left axillary nodes and establishment of interaxillary pathway, right inguinal nodes and establishment of axillo inguinal pathway, right UE working proximal to distal then retracing all steps    Compression Bandaging  thick stockinette, elastomull to fingers 1-5, 1/2 gray foam to back of hand and antecubital fossa, 1 artiflex from wrist to axilla, 1 6cm, 1 8cm and 2 12 cm bandages from hand to axilla (1st 12 cm bandage in herringbone)                  PT Long Term Goals - 05/23/19 7782      PT LONG TERM GOAL #1   Title  Pt will be able to internally and externally rotate L shoulder without hiking left shoulder    Time  4    Period  Weeks    Status  New    Target Date  07/04/19      PT LONG TERM GOAL #2   Title  Pt will be independent in self MLD for RUE to allow long term management of lymphedema    Time  4    Period  Weeks    Status  New    Target Date  07/04/19      PT LONG TERM GOAL #3   Title  Pt will report a 75% improvement in tightness in R axilla and pec with R overhead motion to allow improved comfort    Time  4    Period  Weeks    Status  New    Target Date  07/04/19      PT LONG TERM GOAL #4   Title  Pt will obtain appropriate compression garments for long term management of lymphedema    Time  4    Period  Weeks    Status  New    Target Date  07/04/19      PT LONG TERM GOAL #5   Title  Pt will be independent in a home exercise program for  continued strengthening and stretching    Time  4    Period  Weeks    Status  New    Target Date  07/04/19            Plan - 06/18/19 4235    Clinical Impression Statement  Continued with MLD and bandaging  today. Pt was measured for compression garments yesterday and is awaiting their arrival.    Stability/Clinical Decision Making  Stable/Uncomplicated    Rehab Potential  Good    PT Frequency  3x / week    PT Duration  6 weeks    PT Treatment/Interventions  ADLs/Self Care Home Management;Therapeutic exercise;Patient/family education;Manual techniques;Taping;Manual lymph drainage;Scar mobilization;Compression bandaging;Passive range of motion;Vasopneumatic Device;Joint Manipulations    PT Next Visit Plan  continue MLD to RUE and instruct/issue handout, cont compression bandaging and at some pt teach a family memeber to bandage to decrease to 2x/wk, PROM to bilateral shoulders, scap mobs to L, scar massage and myofascial to R pec    Consulted and Agree with Plan of Care  Patient       Patient will benefit from skilled therapeutic intervention in order to improve the following deficits and impairments:  Decreased range of motion, Decreased scar mobility, Decreased strength, Pain, Postural dysfunction, Increased edema  Visit Diagnosis: 1. Postmastectomy lymphedema        Problem List Patient Active Problem List   Diagnosis Date Noted  . Cancer of right breast, stage 2 ([place]) 01/22/2018  . Genetic testing 08/18/2017  . BRCA2 positive 08/18/2017  . Family history of breast cancer   . Family history of ovarian cancer   . Family history of prostate cancer   . Malignant neoplasm of upper-outer quadrant of right breast in female, estrogen receptor negative ([place]) 07/25/2017  . Perforated bowel ([place]) 05/13/2015  . Free intraperitoneal air 05/12/2015  . Submucosal lesion of esophagus 10/06/2014  . 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  Diabetes ([place]) 09/24/2014  . Morbid obesity ([place]) 03/20/2014    Allyson Sabal [facility] 06/18/2019, 9:23 AM  [place], [place], [address] Phone: [phone]   Fax:  [phone]  Name: Kara Crawford MRN: [id number] Date of Birth: Sep 04, 1966  Manus Gunning, PT 06/18/19 9:24 AM

## 2019-06-20 ENCOUNTER — Other Ambulatory Visit: Payer: Self-pay

## 2019-06-20 ENCOUNTER — Encounter: Payer: Self-pay | Admitting: Physical Therapy

## 2019-06-20 ENCOUNTER — Ambulatory Visit: Payer: 59 | Admitting: Physical Therapy

## 2019-06-20 DIAGNOSIS — I972 Postmastectomy lymphedema syndrome: Secondary | ICD-10-CM

## 2019-06-20 NOTE — Therapy (Signed)
[place], [place], [address] Phone: [phone]   Fax:  [phone]  Physical Therapy Treatment  Patient Details  Name: Kara Crawford MRN: [id number] Date of Birth: 31-Jan-1966 Referring Provider (PT): Dr. Theadore Nan   Encounter Date: 06/20/2019  PT End of Session - 06/20/19 1454    Visit Number  13    Number of Visits  13    Date for PT Re-Evaluation  07/04/19    PT Start Time  1402    PT Stop Time  1448    PT Time Calculation (min)  46 min    Activity Tolerance  Patient tolerated treatment well    Behavior During Therapy  [facility] for tasks assessed/performed       Past Medical History:  Diagnosis Date  . Anemia    anemia prior to uterine emboliztion procedure.  . Breast cancer ([place])   . Diabetes mellitus without complication ([place])    type 2  . Family history of breast cancer   . Family history of ovarian cancer   . Family history of prostate cancer   . Fibroids   . Headache   . Heart murmur   . History of radiation therapy 03/07/18- 04/18/18   Right Chest wall 50 Gy in 25 fractions, Right supraclavicular fossa 50 Gy in 25 fractions, Right Chest wall scar boost 10 Gy in 5 fractions.   . Hyperlipidemia   . Hypertension     Past Surgical History:  Procedure Laterality Date  . BREAST SURGERY     double mastectomy  . BREATH TEK H PYLORI N/A 04/17/2014   Procedure: BREATH TEK H PYLORI;  Surgeon: Shann Medal, MD;  Location: [facility];  Service: General;  Laterality: N/A;  . CESAREAN SECTION     x3- normal development  . CYSTOSCOPY N/A 07/18/2018   Procedure: CYSTOSCOPY;  Surgeon: Christophe Louis, MD;  Location: [facility];  Service: Gynecology;  Laterality: N/A;  . ESOPHAGOGASTRODUODENOSCOPY N/A 08/20/2015   Procedure: ESOPHAGOGASTRODUODENOSCOPY (EGD);  Surgeon: Alphonsa Overall, MD;  Location: [facility];  Service: General;  Laterality: N/A;  . EXPLORATORY LAPAROTOMY  05/13/2015  . GASTRIC  ROUX-EN-Y N/A 10/06/2014   Procedure: LAPAROSCOPIC ROUX-EN-Y GASTRIC BYPASS WITH UPPER ENDOSCOPY;  Surgeon: Pedro Earls, MD;  Location: [facility];  Service: General;  Laterality: N/A;  . LAPAROSCOPIC SALPINGO OOPHERECTOMY Bilateral 07/18/2018   Procedure: LAPAROSCOPIC SALPINGO OOPHORECTOMY;  Surgeon: Christophe Louis, MD;  Location: [facility];  Service: Gynecology;  Laterality: Bilateral;  possible laparotomy with abdominal bilateral salpingoophorectomy  . LAPAROSCOPY N/A 05/12/2015   Procedure: LAPAROSCOPY REPAIR OF PERFORATED BOWEL;  Surgeon: Alphonsa Overall, MD;  Location: [facility];  Service: General;  Laterality: N/A;  . MASTECTOMY WITH RADIOACTIVE SEED GUIDED EXCISION AND AXILLARY SENTINEL LYMPH NODE BIOPSY Bilateral 01/22/2018   Procedure: BILATERAL MASTECTOMIES WITH RADIOACTIVE SEED TARGETED RIGHT AXILLARY [place] NODE EXCISION AND RIGHT SENTINEL LYMPH NODE BIOPSY;  Surgeon: Alphonsa Overall, MD;  Location: [place] [facility];  Service: General;  Laterality: Bilateral;  . PORT-A-CATH REMOVAL Left 01/22/2018   Procedure: REMOVAL PORT-A-CATH;  Surgeon: Alphonsa Overall, MD;  Location: [place];  Service: General;  Laterality: Left;  . PORTACATH PLACEMENT N/A 08/02/2017   Procedure: INSERTION PORT-A-CATH;  Surgeon: Alphonsa Overall, MD;  Location: [place];  Service: General;  Laterality: N/A;  . UTERINE ARTERY EMBOLIZATION  08-16-2010    There were no vitals filed for this visit.  Subjective Assessment - 06/20/19 1405    Subjective  The  bandages did good.    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 It measures 1 cm and is located in the upper outer quadrant with a Ki67 of 90%. She had 3 abnormal looking axillary nodes on ultrasound and 1 was biopsied and found to be positive, 01/22/18- underwent bilateral mastectomy and a SLNB on the right side with 6/6 were negative, pt has completed chemotherapy and radiation.  Has been seen here  previously for lymphedema and shoulder ROM.      Patient Stated Goals  to get the swelling down    Currently in Pain?  No/denies    Pain Score  0-No pain                  [facility]patient [facility]  Lymphedema Life Impact Scale Total Score  5.88 %           [facility] Adult PT Treatment/Exercise - 06/20/19 0001      Manual Therapy   Manual Lymphatic Drainage (MLD)  short neck, superficial and deep abdominals, left axillary nodes and establishment of interaxillary pathway, right inguinal nodes and establishment of axillo inguinal pathway, right UE working proximal to distal then retracing all steps    Compression Bandaging  thick stockinette, elastomull to fingers 1-4, 1/2 gray foam to back of hand and antecubital fossa, 1 artiflex from wrist to axilla, 1 6cm, 1 8cm and 2 12 cm bandages from hand to axilla (1st 12 cm bandage in herringbone)                  PT Long Term Goals - 05/23/19 0277      PT LONG TERM GOAL #1   Title  Pt will be able to internally and externally rotate L shoulder without hiking left shoulder    Time  4    Period  Weeks    Status  New    Target Date  07/04/19      PT LONG TERM GOAL #2   Title  Pt will be independent in self MLD for RUE to allow long term management of lymphedema    Time  4    Period  Weeks    Status  New    Target Date  07/04/19      PT LONG TERM GOAL #3   Title  Pt will report a 75% improvement in tightness in R axilla and pec with R overhead motion to allow improved comfort    Time  4    Period  Weeks    Status  New    Target Date  07/04/19      PT LONG TERM GOAL #4   Title  Pt will obtain appropriate compression garments for long term management of lymphedema    Time  4    Period  Weeks    Status  New    Target Date  07/04/19      PT LONG TERM GOAL #5   Title  Pt will be independent in a home exercise program for continued strengthening and stretching     Time  4    Period  Weeks    Status  New    Target Date  07/04/19            Plan - 06/20/19 1454    Clinical Impression Statement  Pt is still awaiting arrival of compression garments. She had  some upper arm discomfort while unwrapped most likely due to swelling in upper arm. Continued with MLD and bandaging until garments arrive.    Stability/Clinical Decision Making  Stable/Uncomplicated    Rehab Potential  Good    PT Frequency  3x / week    PT Duration  6 weeks    PT Treatment/Interventions  ADLs/Self Care Home Management;Therapeutic exercise;Patient/family education;Manual techniques;Taping;Manual lymph drainage;Scar mobilization;Compression bandaging;Passive range of motion;Vasopneumatic Device;Joint Manipulations    PT Next Visit Plan  continue MLD to RUE and instruct/issue handout, cont compression bandaging and at some pt teach a family memeber to bandage to decrease to 2x/wk, PROM to bilateral shoulders, scap mobs to L, scar massage and myofascial to R pec    Consulted and Agree with Plan of Care  Patient       Patient will benefit from skilled therapeutic intervention in order to improve the following deficits and impairments:  Decreased range of motion, Decreased scar mobility, Decreased strength, Pain, Postural dysfunction, Increased edema  Visit Diagnosis: Postmastectomy lymphedema     Problem List Patient Active Problem List   Diagnosis Date Noted  . Cancer of right breast, stage 2 ([place]) 01/22/2018  . Genetic testing 08/18/2017  . BRCA2 positive 08/18/2017  . Family history of breast cancer   . Family history of ovarian cancer   . Family history of prostate cancer   . Malignant neoplasm of upper-outer quadrant of right breast in female, estrogen receptor negative ([place]) 07/25/2017  . Perforated bowel ([place]) 05/13/2015  . Free intraperitoneal air 05/12/2015  . Submucosal lesion of esophagus 10/06/2014  . 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 S/P  gastric bypass 10/06/2014  . Diabetes ([place]) 09/24/2014  . Morbid obesity ([place]) 03/20/2014    Allyson Sabal [facility] 06/20/2019, 2:56 PM  [place], [place], [address] Phone: [phone]   Fax:  [phone]  Name: SCHERRY LAVERNE MRN: [id number] Date of Birth: [birth year]  Manus Gunning, PT 06/20/19 2:56 PM

## 2019-06-21 ENCOUNTER — Ambulatory Visit: Payer: 59 | Admitting: Physical Therapy

## 2019-06-21 ENCOUNTER — Encounter: Payer: Self-pay | Admitting: Physical Therapy

## 2019-06-21 ENCOUNTER — Other Ambulatory Visit: Payer: Self-pay

## 2019-06-21 DIAGNOSIS — I972 Postmastectomy lymphedema syndrome: Secondary | ICD-10-CM | POA: Diagnosis not present

## 2019-06-21 NOTE — Patient Instructions (Signed)
Deep Effective Breath   Standing, sitting, or laying down, place both hands on the belly. Take a deep breath IN, expanding the belly; then breath OUT, contracting the belly. Repeat __5__ times. Do __2-3__ sessions per day and before your self massage.  http://gt2.exer.us/866   Copyright  VHI. All rights reserved.  Axilla to Axilla - Sweep   On uninvolved side make 5 circles in the armpit, then pump (stretch skin) 5-10 times from involved armpit across chest to uninvolved armpit, making a pathway. Do _1__ time per day.  Copyright  VHI. All rights reserved.  Axilla to Inguinal Nodes - Sweep   On involved side, make 5 circles at groin at panty line, then pump (stretch skin) _5-10__ times from armpit along side of trunk to outer hip, making your other pathway. Do __1_ time per day.  Copyright  VHI. All rights reserved.  Arm Posterior: Elbow to Shoulder - Sweep   Pump _5__ times from back of elbow to top of shoulder. Then inner to outer upper arm _5-10_ times, then outer arm again _5-10_ times. Then back to the pathways _2-3_ times. Do _1__ time per day.  Copyright  VHI. All rights reserved.  ARM: [place] Wrist to Elbow - Sweep   Pump or stationary circles _5-10__ times from wrist to elbow making sure to do both sides of the forearm. Then retrace your steps to the outer arm, and the pathways _2-3_ times each. Do _1__ time per day.  Copyright  VHI. All rights reserved.  ARM: Dorsum of Hand to Shoulder - Sweep   Pump or stationary circles _5-10__ times on back of hand including knuckle spaces and individual fingers if needed working up towards the wrist, then retrace all your steps working back up the forearm, doing both sides; upper outer arm and back to your pathways _2-3_ times each. Then do 5 circles again at uninvolved armpit and involved groin where you started! Good job!! Do __1_ time per day.  Copyright  VHI. All rights reserved.

## 2019-06-21 NOTE — Therapy (Signed)
[place], [place], [address] Phone: [phone]   Fax:  [phone]  Physical Therapy Treatment  Patient Details  Name: Kara Crawford MRN: [id number] Date of Birth: 1966/08/22 Referring Provider (PT): Dr. Theadore Nan   Encounter Date: 06/21/2019  PT End of Session - 06/21/19 0922    Visit Number  14    Number of Visits  13    Date for PT Re-Evaluation  07/04/19    PT Start Time  0832    PT Stop Time  0917    PT Time Calculation (min)  45 min    Activity Tolerance  Patient tolerated treatment well    Behavior During Therapy  [facility] for tasks assessed/performed       Past Medical History:  Diagnosis Date  . Anemia    anemia prior to uterine emboliztion procedure.  . Breast cancer ([place])   . Diabetes mellitus without complication ([place])    type 2  . Family history of breast cancer   . Family history of ovarian cancer   . Family history of prostate cancer   . Fibroids   . Headache   . Heart murmur   . History of radiation therapy 03/07/18- 04/18/18   Right Chest wall 50 Gy in 25 fractions, Right supraclavicular fossa 50 Gy in 25 fractions, Right Chest wall scar boost 10 Gy in 5 fractions.   . Hyperlipidemia   . Hypertension     Past Surgical History:  Procedure Laterality Date  . BREAST SURGERY     double mastectomy  . BREATH TEK H PYLORI N/A 04/17/2014   Procedure: BREATH TEK H PYLORI;  Surgeon: Shann Medal, MD;  Location: [facility];  Service: General;  Laterality: N/A;  . CESAREAN SECTION     x3- normal development  . CYSTOSCOPY N/A 07/18/2018   Procedure: CYSTOSCOPY;  Surgeon: Christophe Louis, MD;  Location: [facility];  Service: Gynecology;  Laterality: N/A;  . ESOPHAGOGASTRODUODENOSCOPY N/A 08/20/2015   Procedure: ESOPHAGOGASTRODUODENOSCOPY (EGD);  Surgeon: Alphonsa Overall, MD;  Location: [facility];  Service: General;  Laterality: N/A;  . EXPLORATORY LAPAROTOMY  05/13/2015  . GASTRIC  ROUX-EN-Y N/A 10/06/2014   Procedure: LAPAROSCOPIC ROUX-EN-Y GASTRIC BYPASS WITH UPPER ENDOSCOPY;  Surgeon: Pedro Earls, MD;  Location: [facility];  Service: General;  Laterality: N/A;  . LAPAROSCOPIC SALPINGO OOPHERECTOMY Bilateral 07/18/2018   Procedure: LAPAROSCOPIC SALPINGO OOPHORECTOMY;  Surgeon: Christophe Louis, MD;  Location: [facility];  Service: Gynecology;  Laterality: Bilateral;  possible laparotomy with abdominal bilateral salpingoophorectomy  . LAPAROSCOPY N/A 05/12/2015   Procedure: LAPAROSCOPY REPAIR OF PERFORATED BOWEL;  Surgeon: Alphonsa Overall, MD;  Location: [facility];  Service: General;  Laterality: N/A;  . MASTECTOMY WITH RADIOACTIVE SEED GUIDED EXCISION AND AXILLARY SENTINEL LYMPH NODE BIOPSY Bilateral 01/22/2018   Procedure: BILATERAL MASTECTOMIES WITH RADIOACTIVE SEED TARGETED RIGHT AXILLARY [place] NODE EXCISION AND RIGHT SENTINEL LYMPH NODE BIOPSY;  Surgeon: Alphonsa Overall, MD;  Location: [facility];  Service: General;  Laterality: Bilateral;  . PORT-A-CATH REMOVAL Left 01/22/2018   Procedure: REMOVAL PORT-A-CATH;  Surgeon: Alphonsa Overall, MD;  Location: [facility];  Service: General;  Laterality: Left;  . PORTACATH PLACEMENT N/A 08/02/2017   Procedure: INSERTION PORT-A-CATH;  Surgeon: Alphonsa Overall, MD;  Location: [facility];  Service: General;  Laterality: N/A;  . UTERINE ARTERY EMBOLIZATION  08-16-2010    There were no vitals filed for this visit.  Subjective Assessment - 06/21/19 0835    Subjective  The  bandages did great.    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 It measures 1 cm and is located in the upper outer quadrant with a Ki67 of 90%. She had 3 abnormal looking axillary nodes on ultrasound and 1 was biopsied and found to be positive, 01/22/18- underwent bilateral mastectomy and a SLNB on the right side with 6/6 were negative, pt has completed chemotherapy and radiation.  Has been seen here  previously for lymphedema and shoulder ROM.      Patient Stated Goals  to get the swelling down    Currently in Pain?  No/denies    Pain Score  0-No pain            LYMPHEDEMA/ONCOLOGY QUESTIONNAIRE - 06/21/19 0838      Right Upper Extremity Lymphedema   15 cm Proximal to Olecranon Process  34.4 cm    Olecranon Process  29 cm    15 cm Proximal to Ulnar Styloid Process  29.5 cm    Just Proximal to Ulnar Styloid Process  19.1 cm    Across Hand at [employer]  20.1 cm    At [place] of 2nd Digit  6.5 cm           Outpatient [facility] from [facility]  Lymphedema Life Impact Scale Total Score  5.88 %           [facility] Adult PT Treatment/Exercise - 06/21/19 0001      Manual Therapy   Manual Lymphatic Drainage (MLD)  Issued handout and instructed pt throughout: short neck, superficial and deep abdominals, left axillary nodes and establishment of interaxillary pathway, right inguinal nodes and establishment of axillo inguinal pathway, right UE working proximal to distal then retracing all steps    Compression Bandaging  thick stockinette, elastomull to fingers 1-4, 1/2 gray foam to back of hand and antecubital fossa, 1 rosidal from wrist to axilla, 1 6cm, 1 8cm and 2 12 cm bandages from hand to axilla (1st 12 cm bandage in herringbone)                  PT Long Term Goals - 05/23/19 1245      PT LONG TERM GOAL #1   Title  Pt will be able to internally and externally rotate L shoulder without hiking left shoulder    Time  4    Period  Weeks    Status  New    Target Date  07/04/19      PT LONG TERM GOAL #2   Title  Pt will be independent in self MLD for RUE to allow long term management of lymphedema    Time  4    Period  Weeks    Status  New    Target Date  07/04/19      PT LONG TERM GOAL #3   Title  Pt will report a 75% improvement in tightness in R axilla and pec with R overhead motion to allow improved comfort     Time  4    Period  Weeks    Status  New    Target Date  07/04/19      PT LONG TERM GOAL #4   Title  Pt will obtain appropriate compression garments for long term management of lymphedema    Time  4    Period  Weeks    Status  New  Target Date  07/04/19      PT LONG TERM GOAL #5   Title  Pt will be independent in a home exercise program for continued strengthening and stretching    Time  4    Period  Weeks    Status  New    Target Date  07/04/19            Plan - 06/21/19 0922    Clinical Impression Statement  Measured R UE circumferences today. She had slight increase in circumference at forearm and upper arm so added rosidal in place of artiflex today to see if that helps. Instructed pt in self MLD and issued handout.    Rehab Potential  Good    PT Frequency  3x / week    PT Duration  6 weeks    PT Treatment/Interventions  ADLs/Self Care Home Management;Therapeutic exercise;Patient/family education;Manual techniques;Taping;Manual lymph drainage;Scar mobilization;Compression bandaging;Passive range of motion;Vasopneumatic Device;Joint Manipulations    PT Next Visit Plan  continue MLD to RUE and instruct/issue handout, cont compression bandaging and at some pt teach a family memeber to bandage to decrease to 2x/wk, PROM to bilateral shoulders, scap mobs to L, scar massage and myofascial to R pec    PT Home Exercise Plan  self MLD    Consulted and Agree with Plan of Care  Patient       Patient will benefit from skilled therapeutic intervention in order to improve the following deficits and impairments:  Decreased range of motion, Decreased scar mobility, Decreased strength, Pain, Postural dysfunction, Increased edema  Visit Diagnosis: Postmastectomy lymphedema     Problem List Patient Active Problem List   Diagnosis Date Noted  . Cancer of right breast, stage 2 ([place]) 01/22/2018  . Genetic testing 08/18/2017  . BRCA2 positive 08/18/2017  . Family history of  breast cancer   . Family history of ovarian cancer   . Family history of prostate cancer   . Malignant neoplasm of upper-outer quadrant of right breast in female, estrogen receptor negative ([place]) 07/25/2017  . Perforated bowel ([place]) 05/13/2015  . Free intraperitoneal air 05/12/2015  . Submucosal lesion of esophagus 10/06/2014  . 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 Diabetes ([place]) 09/24/2014  . Morbid obesity ([place]) 03/20/2014    Allyson Sabal [facility] 06/21/2019, 9:24 AM  [place], [place], [address] Phone: [phone]   Fax:  [phone]  Name: TERRYL NIZIOLEK MRN: [id number] Date of Birth: 1966-03-02  Manus Gunning, PT 06/21/19 9:24 AM

## 2019-06-25 ENCOUNTER — Ambulatory Visit: Payer: 59 | Admitting: Physical Therapy

## 2019-06-25 ENCOUNTER — Encounter: Payer: Self-pay | Admitting: Physical Therapy

## 2019-06-25 ENCOUNTER — Other Ambulatory Visit: Payer: Self-pay

## 2019-06-25 DIAGNOSIS — I972 Postmastectomy lymphedema syndrome: Secondary | ICD-10-CM | POA: Diagnosis not present

## 2019-06-25 NOTE — Therapy (Signed)
[place], [place], [address] Phone: [phone]   Fax:  [phone]  Physical Therapy Treatment  Patient Details  Name: Kara Crawford MRN: [id number] Date of Birth: 1966-09-18 Referring Provider (PT): Dr. Theadore Nan   Encounter Date: 06/25/2019  PT End of Session - 06/25/19 0923    Visit Number  15    Number of Visits  13    Date for PT Re-Evaluation  07/04/19    PT Start Time  0838    PT Stop Time  0917    PT Time Calculation (min)  39 min    Activity Tolerance  Patient tolerated treatment well    Behavior During Therapy  [facility] for tasks assessed/performed       Past Medical History:  Diagnosis Date  . Anemia    anemia prior to uterine emboliztion procedure.  . Breast cancer ([place])   . Diabetes mellitus without complication ([place])    type 2  . Family history of breast cancer   . Family history of ovarian cancer   . Family history of prostate cancer   . Fibroids   . Headache   . Heart murmur   . History of radiation therapy 03/07/18- 04/18/18   Right Chest wall 50 Gy in 25 fractions, Right supraclavicular fossa 50 Gy in 25 fractions, Right Chest wall scar boost 10 Gy in 5 fractions.   . Hyperlipidemia   . Hypertension     Past Surgical History:  Procedure Laterality Date  . BREAST SURGERY     double mastectomy  . BREATH TEK H PYLORI N/A 04/17/2014   Procedure: BREATH TEK H PYLORI;  Surgeon: Shann Medal, MD;  Location: [facility];  Service: General;  Laterality: N/A;  . CESAREAN SECTION     x3- normal development  . CYSTOSCOPY N/A 07/18/2018   Procedure: CYSTOSCOPY;  Surgeon: Christophe Louis, MD;  Location: [facility];  Service: Gynecology;  Laterality: N/A;  . ESOPHAGOGASTRODUODENOSCOPY N/A 08/20/2015   Procedure: ESOPHAGOGASTRODUODENOSCOPY (EGD);  Surgeon: Alphonsa Overall, MD;  Location: [facility];  Service: General;  Laterality: N/A;  . EXPLORATORY LAPAROTOMY  05/13/2015  . GASTRIC  ROUX-EN-Y N/A 10/06/2014   Procedure: LAPAROSCOPIC ROUX-EN-Y GASTRIC BYPASS WITH UPPER ENDOSCOPY;  Surgeon: Pedro Earls, MD;  Location: [facility];  Service: General;  Laterality: N/A;  . LAPAROSCOPIC SALPINGO OOPHERECTOMY Bilateral 07/18/2018   Procedure: LAPAROSCOPIC SALPINGO OOPHORECTOMY;  Surgeon: Christophe Louis, MD;  Location: [facility];  Service: Gynecology;  Laterality: Bilateral;  possible laparotomy with abdominal bilateral salpingoophorectomy  . LAPAROSCOPY N/A 05/12/2015   Procedure: LAPAROSCOPY REPAIR OF PERFORATED BOWEL;  Surgeon: Alphonsa Overall, MD;  Location: [facility];  Service: General;  Laterality: N/A;  . MASTECTOMY WITH RADIOACTIVE SEED GUIDED EXCISION AND AXILLARY SENTINEL LYMPH NODE BIOPSY Bilateral 01/22/2018   Procedure: BILATERAL MASTECTOMIES WITH RADIOACTIVE SEED TARGETED RIGHT AXILLARY [place] NODE EXCISION AND RIGHT SENTINEL LYMPH NODE BIOPSY;  Surgeon: Alphonsa Overall, MD;  Location: [facility];  Service: General;  Laterality: Bilateral;  . PORT-A-CATH REMOVAL Left 01/22/2018   Procedure: REMOVAL PORT-A-CATH;  Surgeon: Alphonsa Overall, MD;  Location: [facility];  Service: General;  Laterality: Left;  . PORTACATH PLACEMENT N/A 08/02/2017   Procedure: INSERTION PORT-A-CATH;  Surgeon: Alphonsa Overall, MD;  Location: [facility];  Service: General;  Laterality: N/A;  . UTERINE ARTERY EMBOLIZATION  08-16-2010    There were no vitals filed for this visit.  Subjective Assessment - 06/25/19 0839    Subjective  I  cleaned out my garage this weekend. I did not wear the right shoes and my feet were throbbing. The foam worked well. My arm looked smaller.    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 It measures 1 cm and is located in the upper outer quadrant with a Ki67 of 90%. She had 3 abnormal looking axillary nodes on ultrasound and 1 was biopsied and found to be positive, 01/22/18- underwent bilateral mastectomy and  a SLNB on the right side with 6/6 were negative, pt has completed chemotherapy and radiation.  Has been seen here previously for lymphedema and shoulder ROM.      Patient Stated Goals  to get the swelling down    Currently in Pain?  No/denies    Pain Score  0-No pain                  Outpatient Rehab from 05/23/2019 in [facility]  Lymphedema Life Impact Scale Total Score  5.88 %           [facility] Adult PT Treatment/Exercise - 06/25/19 0001      Manual Therapy   Manual Lymphatic Drainage (MLD)  Answered pt's questions about self MLD and had her demonstrate her technique giving verbal and tactile cues for correct sequence and hand placement: short neck, superficial and deep abdominals, left axillary nodes and establishment of interaxillary pathway, right inguinal nodes and establishment of axillo inguinal pathway, right UE working proximal to distal then retracing all steps    Compression Bandaging  thick stockinette, elastomull to fingers 1-4, 1/2 gray foam to back of hand and antecubital fossa, 1 rosidal from wrist to axilla, 1 6cm, 1 8cm and 2 12 cm bandages from hand to axilla (1st 12 cm bandage in herringbone)                  PT Long Term Goals - 05/23/19 1610      PT LONG TERM GOAL #1   Title  Pt will be able to internally and externally rotate L shoulder without hiking left shoulder    Time  4    Period  Weeks    Status  New    Target Date  07/04/19      PT LONG TERM GOAL #2   Title  Pt will be independent in self MLD for RUE to allow long term management of lymphedema    Time  4    Period  Weeks    Status  New    Target Date  07/04/19      PT LONG TERM GOAL #3   Title  Pt will report a 75% improvement in tightness in R axilla and pec with R overhead motion to allow improved comfort    Time  4    Period  Weeks    Status  New    Target Date  07/04/19      PT LONG TERM GOAL #4   Title  Pt will obtain appropriate  compression garments for long term management of lymphedema    Time  4    Period  Weeks    Status  New    Target Date  07/04/19      PT LONG TERM GOAL #5   Title  Pt will be independent in a home exercise program for continued strengthening and stretching    Time  4    Period  Weeks    Status  New    Target Date  07/04/19            Plan - 06/25/19 7096    Clinical Impression Statement  Pt has been trying self MLD at home and had several questions today. Answered pt's questions and had her demonstrate her technique. Gave mod to max verbal and tactile cues for correct sequence and hand technique. By end of session pt had correct sequence. Also discussed benefits of a compression pump with pt and pt was interested in [place]. Sending demographics with pt's permission to [facility].    PT Frequency  3x / week    PT Duration  6 weeks    PT Treatment/Interventions  ADLs/Self Care Home Management;Therapeutic exercise;Patient/family education;Manual techniques;Taping;Manual lymph drainage;Scar mobilization;Compression bandaging;Passive range of motion;Vasopneumatic Device;Joint Manipulations    PT Next Visit Plan  continue MLD to RUE and instruct/issue handout, cont compression bandaging and at some pt teach a family memeber to bandage to decrease to 2x/wk, PROM to bilateral shoulders, scap mobs to L, scar massage and myofascial to R pec    PT Home Exercise Plan  self MLD    Recommended Other Services  send demographics to [employer]    Consulted and Agree with Plan of Care  Patient       Patient will benefit from skilled therapeutic intervention in order to improve the following deficits and impairments:  Decreased range of motion, Decreased scar mobility, Decreased strength, Pain, Postural dysfunction, Increased edema  Visit Diagnosis: Postmastectomy lymphedema     Problem List Patient Active Problem List   Diagnosis Date Noted  . Cancer of right breast, stage 2 ([place]) 01/22/2018  .  Genetic testing 08/18/2017  . BRCA2 positive 08/18/2017  . Family history of breast cancer   . Family history of ovarian cancer   . Family history of prostate cancer   . Malignant neoplasm of upper-outer quadrant of right breast in female, estrogen receptor negative ([place]) 07/25/2017  . Perforated bowel ([place]) 05/13/2015  . Free intraperitoneal air 05/12/2015  . Submucosal lesion of esophagus 10/06/2014  . 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 Diabetes ([place]) 09/24/2014  . Morbid obesity ([place]) 03/20/2014    Allyson Sabal [facility] 06/25/2019, 9:26 AM  [place], [place], [address] Phone: [phone]   Fax:  [phone]  Name: KYRA LAFFEY MRN: [id number] Date of Birth: 11-11-1965  Manus Gunning, PT 06/25/19 9:26 AM

## 2019-06-27 ENCOUNTER — Ambulatory Visit: Payer: 59 | Admitting: Physical Therapy

## 2019-06-27 ENCOUNTER — Other Ambulatory Visit: Payer: Self-pay

## 2019-06-27 ENCOUNTER — Encounter: Payer: Self-pay | Admitting: Physical Therapy

## 2019-06-27 DIAGNOSIS — I972 Postmastectomy lymphedema syndrome: Secondary | ICD-10-CM

## 2019-06-27 NOTE — Therapy (Signed)
[place], [place], [address] Phone: [phone]   Fax:  [phone]  Physical Therapy Treatment  Patient Details  Name: Kara Crawford MRN: [id number] Date of Birth: 1966/08/12 Referring Provider (PT): Dr. Theadore Nan   Encounter Date: 06/27/2019  PT End of Session - 06/27/19 0920    Visit Number  16    Number of Visits  13    Date for PT Re-Evaluation  07/04/19    PT Start Time  0838    PT Stop Time  0918    PT Time Calculation (min)  40 min    Activity Tolerance  Patient tolerated treatment well    Behavior During Therapy  [facility] for tasks assessed/performed       Past Medical History:  Diagnosis Date  . Anemia    anemia prior to uterine emboliztion procedure.  . Breast cancer ([place])   . Diabetes mellitus without complication ([place])    type 2  . Family history of breast cancer   . Family history of ovarian cancer   . Family history of prostate cancer   . Fibroids   . Headache   . Heart murmur   . History of radiation therapy 03/07/18- 04/18/18   Right Chest wall 50 Gy in 25 fractions, Right supraclavicular fossa 50 Gy in 25 fractions, Right Chest wall scar boost 10 Gy in 5 fractions.   . Hyperlipidemia   . Hypertension     Past Surgical History:  Procedure Laterality Date  . BREAST SURGERY     double mastectomy  . BREATH TEK H PYLORI N/A 04/17/2014   Procedure: BREATH TEK H PYLORI;  Surgeon: Shann Medal, MD;  Location: [facility];  Service: General;  Laterality: N/A;  . CESAREAN SECTION     x3- normal development  . CYSTOSCOPY N/A 07/18/2018   Procedure: CYSTOSCOPY;  Surgeon: Christophe Louis, MD;  Location: [facility];  Service: Gynecology;  Laterality: N/A;  . ESOPHAGOGASTRODUODENOSCOPY N/A 08/20/2015   Procedure: ESOPHAGOGASTRODUODENOSCOPY (EGD);  Surgeon: Alphonsa Overall, MD;  Location: [facility];  Service: General;  Laterality: N/A;  . EXPLORATORY LAPAROTOMY  05/13/2015  . GASTRIC  ROUX-EN-Y N/A 10/06/2014   Procedure: LAPAROSCOPIC ROUX-EN-Y GASTRIC BYPASS WITH UPPER ENDOSCOPY;  Surgeon: Pedro Earls, MD;  Location[facility];  Service: General;  Laterality: N/A;  . LAPAROSCOPIC SALPINGO OOPHERECTOMY Bilateral 07/18/2018   Procedure: LAPAROSCOPIC SALPINGO OOPHORECTOMY;  Surgeon: Christophe Louis, MD;  Location: [facility];  Service: Gynecology;  Laterality: Bilateral;  possible laparotomy with abdominal bilateral salpingoophorectomy  . LAPAROSCOPY N/A 05/12/2015   Procedure: LAPAROSCOPY REPAIR OF PERFORATED BOWEL;  Surgeon: Alphonsa Overall, MD;  Location: [facility];  Service: General;  Laterality: N/A;  . MASTECTOMY WITH RADIOACTIVE SEED GUIDED EXCISION AND AXILLARY SENTINEL LYMPH NODE BIOPSY Bilateral 01/22/2018   Procedure: BILATERAL MASTECTOMIES WITH RADIOACTIVE SEED TARGETED RIGHT AXILLARY [place] NODE EXCISION AND RIGHT SENTINEL LYMPH NODE BIOPSY;  Surgeon: Alphonsa Overall, MD;  Location: [facility];  Service: General;  Laterality: Bilateral;  . PORT-A-CATH REMOVAL Left 01/22/2018   Procedure: REMOVAL PORT-A-CATH;  Surgeon: Alphonsa Overall, MD;  Location: [facility];  Service: General;  Laterality: Left;  . PORTACATH PLACEMENT N/A 08/02/2017   Procedure: INSERTION PORT-A-CATH;  Surgeon: Alphonsa Overall, MD;  Location: [facility];  Service: General;  Laterality: N/A;  . UTERINE ARTERY EMBOLIZATION  08-16-2010    There were no vitals filed for this visit.  Subjective Assessment - 06/27/19 0840    Subjective  My  arm is doing good. My feet are better.    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 It measures 1 cm and is located in the upper outer quadrant with a Ki67 of 90%. She had 3 abnormal looking axillary nodes on ultrasound and 1 was biopsied and found to be positive, 01/22/18- underwent bilateral mastectomy and a SLNB on the right side with 6/6 were negative, pt has completed chemotherapy and radiation.  Has  been seen here previously for lymphedema and shoulder ROM.      Patient Stated Goals  to get the swelling down    Currently in Pain?  No/denies    Pain Score  0-No pain                  Outpatient [facility]  Lymphedema Life Impact Scale Total Score  5.88 %           [facility] Adult PT Treatment/Exercise - 06/27/19 0001      Manual Therapy   Manual Lymphatic Drainage (MLD)  short neck, superficial and deep abdominals, left axillary nodes and establishment of interaxillary pathway, right inguinal nodes and establishment of axillo inguinal pathway, right UE working proximal to distal then retracing all steps    Compression Bandaging  thick stockinette, elastomull to fingers 1-4, 1/2 gray foam to back of hand and antecubital fossa, 1 rosidal from wrist to axilla, 1 6cm, 1 8cm and 2 12 cm bandages from hand to axilla (1st 12 cm bandage in herringbone)                  PT Long Term Goals - 05/23/19 5053      PT LONG TERM GOAL #1   Title  Pt will be able to internally and externally rotate L shoulder without hiking left shoulder    Time  4    Period  Weeks    Status  New    Target Date  07/04/19      PT LONG TERM GOAL #2   Title  Pt will be independent in self MLD for RUE to allow long term management of lymphedema    Time  4    Period  Weeks    Status  New    Target Date  07/04/19      PT LONG TERM GOAL #3   Title  Pt will report a 75% improvement in tightness in R axilla and pec with R overhead motion to allow improved comfort    Time  4    Period  Weeks    Status  New    Target Date  07/04/19      PT LONG TERM GOAL #4   Title  Pt will obtain appropriate compression garments for long term management of lymphedema    Time  4    Period  Weeks    Status  New    Target Date  07/04/19      PT LONG TERM GOAL #5   Title  Pt will be independent in a home exercise program for continued strengthening  and stretching    Time  4    Period  Weeks    Status  New    Target Date  07/04/19            Plan - 06/27/19 0920    Clinical Impression Statement  Continued with MLD and bandaging  today. Pt states the [place] has increased the comfort of her bandages especially at night. Sent email to [facility] to set up demo for pt in clinic of compression pump.    Rehab Potential  Good    PT Frequency  3x / week    PT Duration  6 weeks    PT Treatment/Interventions  ADLs/Self Care Home Management;Therapeutic exercise;Patient/family education;Manual techniques;Taping;Manual lymph drainage;Scar mobilization;Compression bandaging;Passive range of motion;Vasopneumatic Device;Joint Manipulations    PT Next Visit Plan  continue MLD to RUE and instruct/issue handout, cont compression bandaging and at some pt teach a family memeber to bandage to decrease to 2x/wk, PROM to bilateral shoulders, scap mobs to L, scar massage and myofascial to R pec    PT Home Exercise Plan  self MLD    Consulted and Agree with Plan of Care  Patient       Patient will benefit from skilled therapeutic intervention in order to improve the following deficits and impairments:  Decreased range of motion, Decreased scar mobility, Decreased strength, Pain, Postural dysfunction, Increased edema  Visit Diagnosis: Postmastectomy lymphedema     Problem List Patient Active Problem List   Diagnosis Date Noted  . Cancer of right breast, stage 2 ([place]) 01/22/2018  . Genetic testing 08/18/2017  . BRCA2 positive 08/18/2017  . Family history of breast cancer   . Family history of ovarian cancer   . Family history of prostate cancer   . Malignant neoplasm of upper-outer quadrant of right breast in female, estrogen receptor negative ([place]) 07/25/2017  . Perforated bowel ([place]) 05/13/2015  . Free intraperitoneal air 05/12/2015  . Submucosal lesion of esophagus 10/06/2014  . 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 S/P gastric  bypass 10/06/2014  . Diabetes ([place]) 09/24/2014  . Morbid obesity ([place]) 03/20/2014    Allyson Sabal [facility] 06/27/2019, [place], [place], [address] Phone: [phone]   Fax:  [phone]  Name: Kara Crawford MRN: [id number] Date of Birth: 1966-06-15  Manus Gunning, PT 06/27/19 9:22 AM

## 2019-06-28 ENCOUNTER — Encounter: Payer: Self-pay | Admitting: Physical Therapy

## 2019-06-28 ENCOUNTER — Ambulatory Visit: Payer: 59 | Admitting: Physical Therapy

## 2019-06-28 DIAGNOSIS — I972 Postmastectomy lymphedema syndrome: Secondary | ICD-10-CM | POA: Diagnosis not present

## 2019-06-28 NOTE — Therapy (Signed)
[place], [place], [address] Phone: [phone]   Fax:  [phone]  Physical Therapy Treatment  Patient Details  Name: Kara Crawford MRN: [id number] Date of Birth: [birth year] Referring Provider (PT): Dr. Theadore Nan   Encounter Date: 06/28/2019  PT End of Session - 06/28/19 0924    Visit Number  17    Number of Visits  17    PT Start Time  0833    PT Stop Time  0920    PT Time Calculation (min)  47 min    Activity Tolerance  Patient tolerated treatment well    Behavior During Therapy  [facility] for tasks assessed/performed       Past Medical History:  Diagnosis Date  . Anemia    anemia prior to uterine emboliztion procedure.  . Breast cancer ([place])   . Diabetes mellitus without complication ([place])    type 2  . Family history of breast cancer   . Family history of ovarian cancer   . Family history of prostate cancer   . Fibroids   . Headache   . Heart murmur   . History of radiation therapy 03/07/18- 04/18/18   Right Chest wall 50 Gy in 25 fractions, Right supraclavicular fossa 50 Gy in 25 fractions, Right Chest wall scar boost 10 Gy in 5 fractions.   . Hyperlipidemia   . Hypertension     Past Surgical History:  Procedure Laterality Date  . BREAST SURGERY     double mastectomy  . BREATH TEK H PYLORI N/A 04/17/2014   Procedure: BREATH TEK H PYLORI;  Surgeon: Shann Medal, MD;  Location: [facility];  Service: General;  Laterality: N/A;  . CESAREAN SECTION     x3- normal development  . CYSTOSCOPY N/A 07/18/2018   Procedure: CYSTOSCOPY;  Surgeon: Christophe Louis, MD;  Location: [facility];  Service: Gynecology;  Laterality: N/A;  . ESOPHAGOGASTRODUODENOSCOPY N/A 08/20/2015   Procedure: ESOPHAGOGASTRODUODENOSCOPY (EGD);  Surgeon: Alphonsa Overall, MD;  Location: [facility];  Service: General;  Laterality: N/A;  . EXPLORATORY LAPAROTOMY  05/13/2015  . GASTRIC ROUX-EN-Y N/A 10/06/2014   Procedure:  LAPAROSCOPIC ROUX-EN-Y GASTRIC BYPASS WITH UPPER ENDOSCOPY;  Surgeon: Pedro Earls, MD;  Location: [facility];  Service: General;  Laterality: N/A;  . LAPAROSCOPIC SALPINGO OOPHERECTOMY Bilateral 07/18/2018   Procedure: LAPAROSCOPIC SALPINGO OOPHORECTOMY;  Surgeon: Christophe Louis, MD;  Location: [facility];  Service: Gynecology;  Laterality: Bilateral;  possible laparotomy with abdominal bilateral salpingoophorectomy  . LAPAROSCOPY N/A 05/12/2015   Procedure: LAPAROSCOPY REPAIR OF PERFORATED BOWEL;  Surgeon: Alphonsa Overall, MD;  Location: [facility];  Service: General;  Laterality: N/A;  . MASTECTOMY WITH RADIOACTIVE SEED GUIDED EXCISION AND AXILLARY SENTINEL LYMPH NODE BIOPSY Bilateral 01/22/2018   Procedure: BILATERAL MASTECTOMIES WITH RADIOACTIVE SEED TARGETED RIGHT AXILLARY [place] NODE EXCISION AND RIGHT SENTINEL LYMPH NODE BIOPSY;  Surgeon: Alphonsa Overall, MD;  Location: [facility];  Service: General;  Laterality: Bilateral;  . PORT-A-CATH REMOVAL Left 01/22/2018   Procedure: REMOVAL PORT-A-CATH;  Surgeon: Alphonsa Overall, MD;  Location: [facility];  Service: General;  Laterality: Left;  . PORTACATH PLACEMENT N/A 08/02/2017   Procedure: INSERTION PORT-A-CATH;  Surgeon: Alphonsa Overall, MD;  Location: [facility];  Service: General;  Laterality: N/A;  . UTERINE ARTERY EMBOLIZATION  08-16-2010    There were no vitals filed for this visit.  Subjective Assessment - 06/28/19 0834    Subjective  No changes in anything.    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 It measures 1 cm and is located in the upper outer quadrant with a Ki67 of 90%. She had 3 abnormal looking axillary nodes on ultrasound and 1 was biopsied and found to be positive, 01/22/18- underwent bilateral mastectomy and a SLNB on the right side with 6/6 were negative, pt has completed chemotherapy and radiation.  Has been seen here previously for lymphedema and shoulder  ROM.      Patient Stated Goals  to get the swelling down    Currently in Pain?  No/denies    Pain Score  0-No pain            LYMPHEDEMA/ONCOLOGY QUESTIONNAIRE - 06/28/19 0835      Right Upper Extremity Lymphedema   15 cm Proximal to Olecranon Process  34.2 cm  (Pended)     Olecranon Process  29 cm  (Pended)     15 cm Proximal to Ulnar Styloid Process  30.1 cm  (Pended)     Just Proximal to Ulnar Styloid Process  19.8 cm  (Pended)     Across Hand at [employer]  20.5 cm  (Pended)     At [place] of 2nd Digit  6.7 cm  (Pended)            [facility]  Lymphedema Life Impact Scale Total Score  5.88 %           [facility] Adult PT Treatment/Exercise - 06/28/19 0001      Manual Therapy   Manual Lymphatic Drainage (MLD)  short neck, superficial and deep abdominals, left axillary nodes and establishment of interaxillary pathway, right inguinal nodes and establishment of axillo inguinal pathway, right UE working proximal to distal then retracing all steps    Compression Bandaging  thick stockinette, elastomull to fingers 1-4, 1/2 gray foam to back of hand and antecubital fossa, 1 rosidal from wrist to axilla, 1 6cm, 1 8cm and 2 12 cm bandages from hand to axilla (1st 12 cm bandage in herringbone)                  PT Long Term Goals - 05/23/19 6195      PT LONG TERM GOAL #1   Title  Pt will be able to internally and externally rotate L shoulder without hiking left shoulder    Time  4    Period  Weeks    Status  New    Target Date  07/04/19      PT LONG TERM GOAL #2   Title  Pt will be independent in self MLD for RUE to allow long term management of lymphedema    Time  4    Period  Weeks    Status  New    Target Date  07/04/19      PT LONG TERM GOAL #3   Title  Pt will report a 75% improvement in tightness in R axilla and pec with R overhead motion to allow improved comfort    Time  4     Period  Weeks    Status  New    Target Date  07/04/19      PT LONG TERM GOAL #4   Title  Pt will obtain appropriate compression garments for long term management of lymphedema    Time  4    Period  Weeks    Status  New    Target Date  07/04/19      PT LONG TERM GOAL #5   Title  Pt will be independent in a home exercise program for continued strengthening and stretching    Time  4    Period  Weeks    Status  New    Target Date  07/04/19            Plan - 06/28/19 0924    Clinical Impression Statement  Pt's compression garments should be arriving next week. Took circumferential measurements today and pt has some slight increases in the forearm less than 1 cm and elbow and upper arm are stable. Continued with MLD and bandaging.    Stability/Clinical Decision Making  Stable/Uncomplicated    Rehab Potential  Good    PT Frequency  3x / week    PT Duration  6 weeks    PT Treatment/Interventions  ADLs/Self Care Home Management;Therapeutic exercise;Patient/family education;Manual techniques;Taping;Manual lymph drainage;Scar mobilization;Compression bandaging;Passive range of motion;Vasopneumatic Device;Joint Manipulations    PT Next Visit Plan  continue MLD to RUE and instruct/issue handout, cont compression bandaging and at some pt teach a family memeber to bandage to decrease to 2x/wk, PROM to bilateral shoulders, scap mobs to L, scar massage and myofascial to R pec    PT Home Exercise Plan  self MLD    Consulted and Agree with Plan of Care  Patient       Patient will benefit from skilled therapeutic intervention in order to improve the following deficits and impairments:  Decreased range of motion, Decreased scar mobility, Decreased strength, Pain, Postural dysfunction, Increased edema  Visit Diagnosis: Postmastectomy lymphedema     Problem List Patient Active Problem List   Diagnosis Date Noted  . Cancer of right breast, stage 2 ([place]) 01/22/2018  . Genetic testing  08/18/2017  . BRCA2 positive 08/18/2017  . Family history of breast cancer   . Family history of ovarian cancer   . Family history of prostate cancer   . Malignant neoplasm of upper-outer quadrant of right breast in female, estrogen receptor negative ([place]) 07/25/2017  . Perforated bowel ([place]) 05/13/2015  . Free intraperitoneal air 05/12/2015  . Submucosal lesion of esophagus 10/06/2014  . 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 Diabetes ([place]) 09/24/2014  . Morbid obesity ([place]) 03/20/2014    Allyson Sabal [facility] 06/28/2019, 9:26 AM  [place], [place], [address] Phone: [phone]   Fax:  [phone]  Name: Kara Crawford MRN: [id number] Date of Birth: [birth year]  Manus Gunning, PT 06/28/19 9:26 AM

## 2019-07-02 ENCOUNTER — Other Ambulatory Visit: Payer: Self-pay

## 2019-07-02 ENCOUNTER — Encounter: Payer: Self-pay | Admitting: Physical Therapy

## 2019-07-02 ENCOUNTER — Ambulatory Visit: Payer: 59 | Attending: Family Medicine | Admitting: Physical Therapy

## 2019-07-02 DIAGNOSIS — I972 Postmastectomy lymphedema syndrome: Secondary | ICD-10-CM

## 2019-07-02 DIAGNOSIS — R293 Abnormal posture: Secondary | ICD-10-CM | POA: Insufficient documentation

## 2019-07-02 DIAGNOSIS — L599 Disorder of the skin and subcutaneous tissue related to radiation, unspecified: Secondary | ICD-10-CM | POA: Diagnosis present

## 2019-07-02 DIAGNOSIS — M6281 Muscle weakness (generalized): Secondary | ICD-10-CM | POA: Diagnosis present

## 2019-07-02 DIAGNOSIS — M25611 Stiffness of right shoulder, not elsewhere classified: Secondary | ICD-10-CM | POA: Insufficient documentation

## 2019-07-02 DIAGNOSIS — M25612 Stiffness of left shoulder, not elsewhere classified: Secondary | ICD-10-CM | POA: Diagnosis present

## 2019-07-02 NOTE — Therapy (Signed)
[place], [place], [address] Phone: [phone]   Fax:  [phone]  Physical Therapy Treatment  Patient Details  Name: Kara Crawford MRN: [id number] Date of Birth: 01-14-1966 Referring Provider (PT): Dr. Theadore Nan   Encounter Date: 07/02/2019  PT End of Session - 07/02/19 0921    Visit Number  18    Number of Visits  17    Date for PT Re-Evaluation  07/04/19    PT Start Time  0838    PT Stop Time  0918    PT Time Calculation (min)  40 min    Activity Tolerance  Patient tolerated treatment well    Behavior During Therapy  [facility] for tasks assessed/performed       Past Medical History:  Diagnosis Date  . Anemia    anemia prior to uterine emboliztion procedure.  . Breast cancer ([place])   . Diabetes mellitus without complication ([place])    type 2  . Family history of breast cancer   . Family history of ovarian cancer   . Family history of prostate cancer   . Fibroids   . Headache   . Heart murmur   . History of radiation therapy 03/07/18- 04/18/18   Right Chest wall 50 Gy in 25 fractions, Right supraclavicular fossa 50 Gy in 25 fractions, Right Chest wall scar boost 10 Gy in 5 fractions.   . Hyperlipidemia   . Hypertension     Past Surgical History:  Procedure Laterality Date  . BREAST SURGERY     double mastectomy  . BREATH TEK H PYLORI N/A 04/17/2014   Procedure: BREATH TEK H PYLORI;  Surgeon: Shann Medal, MD;  Location: [facility];  Service: General;  Laterality: N/A;  . CESAREAN SECTION     x3- normal development  . CYSTOSCOPY N/A 07/18/2018   Procedure: CYSTOSCOPY;  Surgeon: Christophe Louis, MD;  Location: [facility];  Service: Gynecology;  Laterality: N/A;  . ESOPHAGOGASTRODUODENOSCOPY N/A 08/20/2015   Procedure: ESOPHAGOGASTRODUODENOSCOPY (EGD);  Surgeon: Alphonsa Overall, MD;  Location: [facility];  Service: General;  Laterality: N/A;  . EXPLORATORY LAPAROTOMY  05/13/2015  . GASTRIC  ROUX-EN-Y N/A 10/06/2014   Procedure: LAPAROSCOPIC ROUX-EN-Y GASTRIC BYPASS WITH UPPER ENDOSCOPY;  Surgeon: Pedro Earls, MD;  Location[facility];  Service: General;  Laterality: N/A;  . LAPAROSCOPIC SALPINGO OOPHERECTOMY Bilateral 07/18/2018   Procedure: LAPAROSCOPIC SALPINGO OOPHORECTOMY;  Surgeon: Christophe Louis, MD;  Location: [facility];  Service: Gynecology;  Laterality: Bilateral;  possible laparotomy with abdominal bilateral salpingoophorectomy  . LAPAROSCOPY N/A 05/12/2015   Procedure: LAPAROSCOPY REPAIR OF PERFORATED BOWEL;  Surgeon: Alphonsa Overall, MD;  Location: [facility];  Service: General;  Laterality: N/A;  . MASTECTOMY WITH RADIOACTIVE SEED GUIDED EXCISION AND AXILLARY SENTINEL LYMPH NODE BIOPSY Bilateral 01/22/2018   Procedure: BILATERAL MASTECTOMIES WITH RADIOACTIVE SEED TARGETED RIGHT AXILLARY [place] NODE EXCISION AND RIGHT SENTINEL LYMPH NODE BIOPSY;  Surgeon: Alphonsa Overall, MD;  Location: [facility];  Service: General;  Laterality: Bilateral;  . PORT-A-CATH REMOVAL Left 01/22/2018   Procedure: REMOVAL PORT-A-CATH;  Surgeon: Alphonsa Overall, MD;  Location: [facility];  Service: General;  Laterality: Left;  . PORTACATH PLACEMENT N/A 08/02/2017   Procedure: INSERTION PORT-A-CATH;  Surgeon: Alphonsa Overall, MD;  Location: [facility];  Service: General;  Laterality: N/A;  . UTERINE ARTERY EMBOLIZATION  08-16-2010    There were no vitals filed for this visit.  Subjective Assessment - 07/02/19 0839    Subjective  My  arm is good.    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 It measures 1 cm and is located in the upper outer quadrant with a Ki67 of 90%. She had 3 abnormal looking axillary nodes on ultrasound and 1 was biopsied and found to be positive, 01/22/18- underwent bilateral mastectomy and a SLNB on the right side with 6/6 were negative, pt has completed chemotherapy and radiation.  Has been seen here previously  for lymphedema and shoulder ROM.      Patient Stated Goals  to get the swelling down    Currently in Pain?  No/denies    Pain Score  0-No pain                  [facility]patient [facility]  Lymphedema Life Impact Scale Total Score  5.88 %           [facility] Adult PT Treatment/Exercise - 07/02/19 0001      Manual Therapy   Manual Lymphatic Drainage (MLD)  short neck, superficial and deep abdominals, left axillary nodes and establishment of interaxillary pathway, right inguinal nodes and establishment of axillo inguinal pathway, right UE working proximal to distal then retracing all steps    Compression Bandaging  thick stockinette, elastomull to fingers 1-4, 1/2 gray foam to back of hand and antecubital fossa, 1 rosidal from wrist to axilla, 1 6cm, 1 8cm and 2 12 cm bandages from hand to axilla (1st 12 cm bandage in herringbone)                  PT Long Term Goals - 05/23/19 5852      PT LONG TERM GOAL #1   Title  Pt will be able to internally and externally rotate L shoulder without hiking left shoulder    Time  4    Period  Weeks    Status  New    Target Date  07/04/19      PT LONG TERM GOAL #2   Title  Pt will be independent in self MLD for RUE to allow long term management of lymphedema    Time  4    Period  Weeks    Status  New    Target Date  07/04/19      PT LONG TERM GOAL #3   Title  Pt will report a 75% improvement in tightness in R axilla and pec with R overhead motion to allow improved comfort    Time  4    Period  Weeks    Status  New    Target Date  07/04/19      PT LONG TERM GOAL #4   Title  Pt will obtain appropriate compression garments for long term management of lymphedema    Time  4    Period  Weeks    Status  New    Target Date  07/04/19      PT LONG TERM GOAL #5   Title  Pt will be independent in a home exercise program for continued strengthening and stretching    Time  4     Period  Weeks    Status  New    Target Date  07/04/19            Plan - 07/02/19 7782    Clinical Impression Statement  Continued MLD and bandaging this session. Pt is awaiting arrival  of compression garments which should happen some time this week.    Rehab Potential  Good    PT Frequency  3x / week    PT Duration  6 weeks    PT Treatment/Interventions  ADLs/Self Care Home Management;Therapeutic exercise;Patient/family education;Manual techniques;Taping;Manual lymph drainage;Scar mobilization;Compression bandaging;Passive range of motion;Vasopneumatic Device;Joint Manipulations    PT Next Visit Plan  continue MLD to RUE and instruct/issue handout, cont compression bandaging and at some pt teach a family memeber to bandage to decrease to 2x/wk, PROM to bilateral shoulders, scap mobs to L, scar massage and myofascial to R pec    PT Home Exercise Plan  self MLD    Consulted and Agree with Plan of Care  Patient       Patient will benefit from skilled therapeutic intervention in order to improve the following deficits and impairments:  Decreased range of motion, Decreased scar mobility, Decreased strength, Pain, Postural dysfunction, Increased edema  Visit Diagnosis: Postmastectomy lymphedema     Problem List Patient Active Problem List   Diagnosis Date Noted  . Cancer of right breast, stage 2 ([place]) 01/22/2018  . Genetic testing 08/18/2017  . BRCA2 positive 08/18/2017  . Family history of breast cancer   . Family history of ovarian cancer   . Family history of prostate cancer   . Malignant neoplasm of upper-outer quadrant of right breast in female, estrogen receptor negative ([place]) 07/25/2017  . Perforated bowel ([place]) 05/13/2015  . Free intraperitoneal air 05/12/2015  . Submucosal lesion of esophagus 10/06/2014  . 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 Diabetes ([place]) 09/24/2014  . Morbid obesity ([place]) 03/20/2014    [facility] 07/02/2019, 9:23 AM  [place], [place], [address] Phone: [phone]   Fax:  [phone]  Name: Kara Crawford MRN: [id number] Date of Birth: [birth year]  Manus Gunning, PT 07/02/19 9:23 AM

## 2019-07-04 ENCOUNTER — Encounter: Payer: 59 | Admitting: Physical Therapy

## 2019-07-05 ENCOUNTER — Ambulatory Visit: Payer: 59 | Admitting: Physical Therapy

## 2019-07-05 ENCOUNTER — Encounter: Payer: Self-pay | Admitting: Physical Therapy

## 2019-07-05 ENCOUNTER — Other Ambulatory Visit: Payer: Self-pay

## 2019-07-05 DIAGNOSIS — M25611 Stiffness of right shoulder, not elsewhere classified: Secondary | ICD-10-CM

## 2019-07-05 DIAGNOSIS — M6281 Muscle weakness (generalized): Secondary | ICD-10-CM

## 2019-07-05 DIAGNOSIS — L599 Disorder of the skin and subcutaneous tissue related to radiation, unspecified: Secondary | ICD-10-CM

## 2019-07-05 DIAGNOSIS — M25612 Stiffness of left shoulder, not elsewhere classified: Secondary | ICD-10-CM

## 2019-07-05 DIAGNOSIS — R293 Abnormal posture: Secondary | ICD-10-CM

## 2019-07-05 DIAGNOSIS — I972 Postmastectomy lymphedema syndrome: Secondary | ICD-10-CM

## 2019-07-05 NOTE — Therapy (Signed)
[place], [place], [address] Phone: [phone]   Fax:  [phone]  Physical Therapy Treatment  Patient Details  Name: Kara Crawford MRN: [id number] Date of Birth: [birth year] Referring Provider (PT): Dr. Theadore Nan   Encounter Date: 07/05/2019  PT End of Session - 07/05/19 0926    Visit Number  19    Number of Visits  28    Date for PT Re-Evaluation  08/02/19    PT Start Time  0838    PT Stop Time  0920    PT Time Calculation (min)  42 min    Activity Tolerance  Patient tolerated treatment well    Behavior During Therapy  [facility] for tasks assessed/performed       Past Medical History:  Diagnosis Date  . Anemia    anemia prior to uterine emboliztion procedure.  . Breast cancer ([place])   . Diabetes mellitus without complication ([place])    type 2  . Family history of breast cancer   . Family history of ovarian cancer   . Family history of prostate cancer   . Fibroids   . Headache   . Heart murmur   . History of radiation therapy 03/07/18- 04/18/18   Right Chest wall 50 Gy in 25 fractions, Right supraclavicular fossa 50 Gy in 25 fractions, Right Chest wall scar boost 10 Gy in 5 fractions.   . Hyperlipidemia   . Hypertension     Past Surgical History:  Procedure Laterality Date  . BREAST SURGERY     double mastectomy  . BREATH TEK H PYLORI N/A 04/17/2014   Procedure: BREATH TEK H PYLORI;  Surgeon: Shann Medal, MD;  Location: [facility];  Service: General;  Laterality: N/A;  . CESAREAN SECTION     x3- normal development  . CYSTOSCOPY N/A 07/18/2018   Procedure: CYSTOSCOPY;  Surgeon: Christophe Louis, MD;  Location: [facility];  Service: Gynecology;  Laterality: N/A;  . ESOPHAGOGASTRODUODENOSCOPY N/A 08/20/2015   Procedure: ESOPHAGOGASTRODUODENOSCOPY (EGD);  Surgeon: Alphonsa Overall, MD;  Location: [facility];  Service: General;  Laterality: N/A;  . EXPLORATORY LAPAROTOMY  05/13/2015  . GASTRIC  ROUX-EN-Y N/A 10/06/2014   Procedure: LAPAROSCOPIC ROUX-EN-Y GASTRIC BYPASS WITH UPPER ENDOSCOPY;  Surgeon: Pedro Earls, MD;  Location: [facility];  Service: General;  Laterality: N/A;  . LAPAROSCOPIC SALPINGO OOPHERECTOMY Bilateral 07/18/2018   Procedure: LAPAROSCOPIC SALPINGO OOPHORECTOMY;  Surgeon: Christophe Louis, MD;  Location: [facility];  Service: Gynecology;  Laterality: Bilateral;  possible laparotomy with abdominal bilateral salpingoophorectomy  . LAPAROSCOPY N/A 05/12/2015   Procedure: LAPAROSCOPY REPAIR OF PERFORATED BOWEL;  Surgeon: Alphonsa Overall, MD;  Location: [facility];  Service: General;  Laterality: N/A;  . MASTECTOMY WITH RADIOACTIVE SEED GUIDED EXCISION AND AXILLARY SENTINEL LYMPH NODE BIOPSY Bilateral 01/22/2018   Procedure: BILATERAL MASTECTOMIES WITH RADIOACTIVE SEED TARGETED RIGHT AXILLARY [place] NODE EXCISION AND RIGHT SENTINEL LYMPH NODE BIOPSY;  Surgeon: Alphonsa Overall, MD;  Location: [facility];  Service: General;  Laterality: Bilateral;  . PORT-A-CATH REMOVAL Left 01/22/2018   Procedure: REMOVAL PORT-A-CATH;  Surgeon: Alphonsa Overall, MD;  Location: [facility];  Service: General;  Laterality: Left;  . PORTACATH PLACEMENT N/A 08/02/2017   Procedure: INSERTION PORT-A-CATH;  Surgeon: Alphonsa Overall, MD;  Location: [facility];  Service: General;  Laterality: N/A;  . UTERINE ARTERY EMBOLIZATION  08-16-2010    There were no vitals filed for this visit.  Subjective Assessment - 07/05/19 0846    Subjective  I  got my sleeve and glove.    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 It measures 1 cm and is located in the upper outer quadrant with a Ki67 of 90%. She had 3 abnormal looking axillary nodes on ultrasound and 1 was biopsied and found to be positive, 01/22/18- underwent bilateral mastectomy and a SLNB on the right side with 6/6 were negative, pt has completed chemotherapy and radiation.  Has been seen here  previously for lymphedema and shoulder ROM.      Patient Stated Goals  to get the swelling down    Currently in Pain?  No/denies    Pain Score  0-No pain            LYMPHEDEMA/ONCOLOGY QUESTIONNAIRE - 07/05/19 0847      Right Upper Extremity Lymphedema   15 cm Proximal to Olecranon Process  34.3 cm    Olecranon Process  29.5 cm    15 cm Proximal to Ulnar Styloid Process  30 cm    Just Proximal to Ulnar Styloid Process  18.3 cm    Across Hand at [employer]  19.7 cm    At [place] of 2nd Digit  6.6 cm           [facility]patient [facility]  Lymphedema Life Impact Scale Total Score  5.88 %           [facility] Adult PT Treatment/Exercise - 07/05/19 0001      Manual Therapy   Manual Therapy  Edema management;Compression Bandaging;Manual Lymphatic Drainage (MLD)    Edema Management  pt received compression sleeve and glove, assessed today that pt is able to don/doff independently and assessed fit    Manual Lymphatic Drainage (MLD)  short neck, superficial and deep abdominals, left axillary nodes and establishment of interaxillary pathway, right inguinal nodes and establishment of axillo inguinal pathway, right UE working proximal to distal then retracing all steps    Compression Bandaging  pt donned compression sleeve and glove                  PT Long Term Goals - 07/05/19 0923      PT LONG TERM GOAL #1   Title  Pt will be able to internally and externally rotate L shoulder without hiking left shoulder    Baseline  07/05/19- pt able to externally rotate without shoulder hiking but unable to internally rotate    Time  4    Period  Weeks    Status  On-going      PT LONG TERM GOAL #2   Title  Pt will be independent in self MLD for RUE to allow long term management of lymphedema    Baseline  07/05/19- pt is independent with this    Time  4    Period  Weeks    Status  Achieved      PT LONG TERM GOAL #3    Title  Pt will report a 75% improvement in tightness in R axilla and pec with R overhead motion to allow improved comfort    Time  4    Period  Weeks    Status  On-going      PT LONG TERM GOAL #4   Title  Pt will obtain appropriate compression garments for long term management of lymphedema    Baseline  07/05/19- pt obtained garments yesterday  Time  4    Period  Weeks    Status  Achieved      PT LONG TERM GOAL #5   Title  Pt will be independent in a home exercise program for continued strengthening and stretching    Time  4    Period  Weeks    Status  On-going            Plan - 07/05/19 1700    Clinical Impression Statement  Pt received her compression garments yesterday. Assessed garments for proper fit and compression and instructed pt in proper donning/doffing technique. Instructed pt to wear garments during the day and bandage at night until she recieves her night time garment. Will begin to focus on left shoulder rotation moving forward. Pt would benefit from additional skilled PT visits decreasing to 2x/wk to montior pt's swelling while wearing new garments and to begin to work on improving left shoulder ROM.    Rehab Potential  Good    PT Frequency  2x / week    PT Duration  4 weeks    PT Treatment/Interventions  ADLs/Self Care Home Management;Therapeutic exercise;Patient/family education;Manual techniques;Taping;Manual lymph drainage;Scar mobilization;Compression bandaging;Passive range of motion;Vasopneumatic Device;Joint Manipulations    PT Next Visit Plan  continue MLD to RUE and instruct/issue handout, remeasure circumference, beging L shoulder PROM, scap mobs to L, scar massage and myofascial to R pec    PT Home Exercise Plan  self MLD    Consulted and Agree with Plan of Care  Patient       Patient will benefit from skilled therapeutic intervention in order to improve the following deficits and impairments:  Decreased range of motion, Decreased scar mobility,  Decreased strength, Pain, Postural dysfunction, Increased edema  Visit Diagnosis: Postmastectomy lymphedema  Stiffness of left shoulder, not elsewhere classified  Stiffness of right shoulder, not elsewhere classified  Disorder of the skin and subcutaneous tissue related to radiation, unspecified  Muscle weakness (generalized)  Abnormal posture     Problem List Patient Active Problem List   Diagnosis Date Noted  . Cancer of right breast, stage 2 ([place]) 01/22/2018  . Genetic testing 08/18/2017  . BRCA2 positive 08/18/2017  . Family history of breast cancer   . Family history of ovarian cancer   . Family history of prostate cancer   . Malignant neoplasm of upper-outer quadrant of right breast in female, estrogen receptor negative ([place]) 07/25/2017  . Perforated bowel ([place]) 05/13/2015  . Free intraperitoneal air 05/12/2015  . Submucosal lesion of esophagus 10/06/2014  . 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 Diabetes ([place]) 09/24/2014  . Morbid obesity ([place]) 03/20/2014    Allyson Sabal [facility] 07/05/2019, 9:31 AM  [place], [place], [address] Phone: [phone]   Fax:  [phone]  Name: Kara Crawford MRN: [id number] Date of Birth: November 11, 1965  Manus Gunning, PT 07/05/19 9:32 AM

## 2019-07-16 ENCOUNTER — Ambulatory Visit: Payer: 59 | Admitting: Physical Therapy

## 2019-07-16 ENCOUNTER — Other Ambulatory Visit: Payer: Self-pay

## 2019-07-16 ENCOUNTER — Encounter: Payer: Self-pay | Admitting: Physical Therapy

## 2019-07-16 DIAGNOSIS — I972 Postmastectomy lymphedema syndrome: Secondary | ICD-10-CM

## 2019-07-16 DIAGNOSIS — M25612 Stiffness of left shoulder, not elsewhere classified: Secondary | ICD-10-CM

## 2019-07-16 NOTE — Therapy (Signed)
[place], [place], [address] Phone: [phone]   Fax:  [phone]  Physical Therapy Treatment  Patient Details  Name: Kara Crawford MRN: [id number] Date of Birth: [birth year] Referring Provider (PT): Dr. Theadore Nan   Encounter Date: 07/16/2019  PT End of Session - 07/16/19 0923    Visit Number  20    Number of Visits  28    Date for PT Re-Evaluation  08/02/19    PT Start Time  0839    PT Stop Time  0920    PT Time Calculation (min)  41 min    Activity Tolerance  Patient tolerated treatment well    Behavior During Therapy  [facility] for tasks assessed/performed       Past Medical History:  Diagnosis Date  . Anemia    anemia prior to uterine emboliztion procedure.  . Breast cancer ([place])   . Diabetes mellitus without complication ([place])    type 2  . Family history of breast cancer   . Family history of ovarian cancer   . Family history of prostate cancer   . Fibroids   . Headache   . Heart murmur   . History of radiation therapy 03/07/18- 04/18/18   Right Chest wall 50 Gy in 25 fractions, Right supraclavicular fossa 50 Gy in 25 fractions, Right Chest wall scar boost 10 Gy in 5 fractions.   . Hyperlipidemia   . Hypertension     Past Surgical History:  Procedure Laterality Date  . BREAST SURGERY     double mastectomy  . BREATH TEK H PYLORI N/A 04/17/2014   Procedure: BREATH TEK H PYLORI;  Surgeon: Shann Medal, MD;  Location: [facility];  Service: General;  Laterality: N/A;  . CESAREAN SECTION     x3- normal development  . CYSTOSCOPY N/A 07/18/2018   Procedure: CYSTOSCOPY;  Surgeon: Christophe Louis, MD;  Location: [facility];  Service: Gynecology;  Laterality: N/A;  . ESOPHAGOGASTRODUODENOSCOPY N/A 08/20/2015   Procedure: ESOPHAGOGASTRODUODENOSCOPY (EGD);  Surgeon: Alphonsa Overall, MD;  Location: [facility];  Service: General;  Laterality: N/A;  . EXPLORATORY LAPAROTOMY  05/13/2015  . GASTRIC  ROUX-EN-Y N/A 10/06/2014   Procedure: LAPAROSCOPIC ROUX-EN-Y GASTRIC BYPASS WITH UPPER ENDOSCOPY;  Surgeon: Pedro Earls, MD;  [facility];  Service: General;  Laterality: N/A;  . LAPAROSCOPIC SALPINGO OOPHERECTOMY Bilateral 07/18/2018   Procedure: LAPAROSCOPIC SALPINGO OOPHORECTOMY;  Surgeon: Christophe Louis, MD;  Location: [facility];  Service: Gynecology;  Laterality: Bilateral;  possible laparotomy with abdominal bilateral salpingoophorectomy  . LAPAROSCOPY N/A 05/12/2015   Procedure: LAPAROSCOPY REPAIR OF PERFORATED BOWEL;  Surgeon: Alphonsa Overall, MD;  Location: [facility];  Service: General;  Laterality: N/A;  . MASTECTOMY WITH RADIOACTIVE SEED GUIDED EXCISION AND AXILLARY SENTINEL LYMPH NODE BIOPSY Bilateral 01/22/2018   Procedure: BILATERAL MASTECTOMIES WITH RADIOACTIVE SEED TARGETED RIGHT AXILLARY [place] NODE EXCISION AND RIGHT SENTINEL LYMPH NODE BIOPSY;  Surgeon: Alphonsa Overall, MD;  Location: [facility];  Service: General;  Laterality: Bilateral;  . PORT-A-CATH REMOVAL Left 01/22/2018   Procedure: REMOVAL PORT-A-CATH;  Surgeon: Alphonsa Overall, MD;  Location: [facility];  Service: General;  Laterality: Left;  . PORTACATH PLACEMENT N/A 08/02/2017   Procedure: INSERTION PORT-A-CATH;  Surgeon: Alphonsa Overall, MD;  Location: [facility];  Service: General;  Laterality: N/A;  . UTERINE ARTERY EMBOLIZATION  08-16-2010    There were no vitals filed for this visit.  Subjective Assessment - 07/16/19 0840    Subjective  I  now can put on my sleeve without a struggle. I now know how to put it on. I think it has managed the swelling.    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 It measures 1 cm and is located in the upper outer quadrant with a Ki67 of 90%. She had 3 abnormal looking axillary nodes on ultrasound and 1 was biopsied and found to be positive, 01/22/18- underwent bilateral mastectomy and a SLNB on the right side with  6/6 were negative, pt has completed chemotherapy and radiation.  Has been seen here previously for lymphedema and shoulder ROM.      Patient Stated Goals  to get the swelling down    Currently in Pain?  No/denies    Pain Score  0-No pain            LYMPHEDEMA/ONCOLOGY QUESTIONNAIRE - 07/16/19 0841      Right Upper Extremity Lymphedema   15 cm Proximal to Olecranon Process  33.7 cm    Olecranon Process  28.6 cm    15 cm Proximal to Ulnar Styloid Process  29.9 cm    Just Proximal to Ulnar Styloid Process  19.1 cm    Across Hand at [employer]  20 cm    At [place] of 2nd Digit  6.5 cm           Outpatient [facility] from [facility]  Lymphedema Life Impact Scale Total Score  5.88 %           [facility] Adult PT Treatment/Exercise - 07/16/19 0001      Manual Therapy   Manual Therapy  Edema management;Manual Lymphatic Drainage (MLD);Joint mobilization;Passive ROM    Joint Mobilization  to L shoulder using gentle A-P osscillations    Manual Lymphatic Drainage (MLD)  short neck, superficial and deep abdominals, left axillary nodes and establishment of interaxillary pathway, right inguinal nodes and establishment of axillo inguinal pathway, right UE working proximal to distal then retracing all steps    Compression Bandaging  pt donned compression sleeve and glove    Passive ROM  to L shoulder in direction of flexion, IR and ER                  PT Long Term Goals - 07/05/19 7564      PT LONG TERM GOAL #1   Title  Pt will be able to internally and externally rotate L shoulder without hiking left shoulder    Baseline  07/05/19- pt able to externally rotate without shoulder hiking but unable to internally rotate    Time  4    Period  Weeks    Status  On-going      PT LONG TERM GOAL #2   Title  Pt will be independent in self MLD for RUE to allow long term management of lymphedema    Baseline  07/05/19- pt is independent with  this    Time  4    Period  Weeks    Status  Achieved      PT LONG TERM GOAL #3   Title  Pt will report a 75% improvement in tightness in R axilla and pec with R overhead motion to allow improved comfort    Time  4    Period  Weeks    Status  On-going      PT LONG TERM GOAL #4   Title  Pt will obtain appropriate compression garments for long term management of lymphedema    Baseline  07/05/19- pt obtained garments yesterday    Time  4    Period  Weeks    Status  Achieved      PT LONG TERM GOAL #5   Title  Pt will be independent in a home exercise program for continued strengthening and stretching    Time  4    Period  Weeks    Status  On-going            Plan - 07/16/19 0924    Clinical Impression Statement  Remeasured RUE circumference since pt has been wearing her sleeve over the past week and all areas have maintained or decreased except at the wrist which does show some increase. Educated pt to try wearing her glove under her sleeve for improved comfort and to see if this helps decrease swelling. May need to add foam bracelet at next session if there is further increase at wrist. Began manual therapy to left shoulder to improve left shoulder ROM and IR. Encouraged pt to continue band exercises at home for strengthening.    Rehab Potential  Good    PT Frequency  2x / week    PT Duration  4 weeks    PT Treatment/Interventions  ADLs/Self Care Home Management;Therapeutic exercise;Patient/family education;Manual techniques;Taping;Manual lymph drainage;Scar mobilization;Compression bandaging;Passive range of motion;Vasopneumatic Device;Joint Manipulations    PT Next Visit Plan  continue MLD to RUE and instruct/issue handout, remeasure circumference, continue L shoulder PROM, scap mobs to L, scar massage and myofascial to R pec    PT Home Exercise Plan  self MLD    Consulted and Agree with Plan of Care  Patient       Patient will benefit from skilled therapeutic intervention in  order to improve the following deficits and impairments:  Decreased range of motion, Decreased scar mobility, Decreased strength, Pain, Postural dysfunction, Increased edema  Visit Diagnosis: Postmastectomy lymphedema  Stiffness of left shoulder, not elsewhere classified     Problem List Patient Active Problem List   Diagnosis Date Noted  . Cancer of right breast, stage 2 ([place]) 01/22/2018  . Genetic testing 08/18/2017  . BRCA2 positive 08/18/2017  . Family history of breast cancer   . Family history of ovarian cancer   . Family history of prostate cancer   . Malignant neoplasm of upper-outer quadrant of right breast in female, estrogen receptor negative ([place]) 07/25/2017  . Perforated bowel ([place]) 05/13/2015  . Free intraperitoneal air 05/12/2015  . Submucosal lesion of esophagus 10/06/2014  . 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 Diabetes ([place]) 09/24/2014  . Morbid obesity ([place]) 03/20/2014    Allyson Sabal [facility] 07/16/2019, 9:26 AM  [place], [place], [address] Phone: [phone]   Fax:  [phone]  Name: Kara Crawford MRN: [id number] Date of Birth: 11-24-1965  Manus Gunning, PT 07/16/19 9:26 AM

## 2019-07-18 ENCOUNTER — Encounter: Payer: 59 | Admitting: Physical Therapy

## 2019-07-19 ENCOUNTER — Encounter: Payer: Self-pay | Admitting: Physical Therapy

## 2019-07-19 ENCOUNTER — Ambulatory Visit: Payer: 59 | Admitting: Physical Therapy

## 2019-07-19 ENCOUNTER — Other Ambulatory Visit: Payer: Self-pay

## 2019-07-19 DIAGNOSIS — I972 Postmastectomy lymphedema syndrome: Secondary | ICD-10-CM | POA: Diagnosis not present

## 2019-07-19 DIAGNOSIS — M6281 Muscle weakness (generalized): Secondary | ICD-10-CM

## 2019-07-19 DIAGNOSIS — M25612 Stiffness of left shoulder, not elsewhere classified: Secondary | ICD-10-CM

## 2019-07-19 NOTE — Patient Instructions (Signed)
Access Code: [email or web address]  URL: [email or web address]  Date: 07/19/2019  Prepared by: Manus Gunning   Exercises Standing Shoulder Posterior Capsule Stretch - 5 reps - 1 sets - 30 hold - 1x daily - 7x weekly Sleeper Stretch - 5 reps - 1 sets - 30 sec hold - 1x daily - 7x weekly Isometric Shoulder Abduction at Wall - 5 reps - 1 sets - 5 sec hold                            - 1x daily - 7x weekly Isometric Shoulder Flexion at Wall - 5 reps - 1 sets - 5 sec hold - 1x daily - 7x weekly Isometric Shoulder Extension at Wall - 5 reps - 1 sets - 5 sec hold                            - 1x daily - 7x weekly Isometric Shoulder External Rotation at Wall - 5 reps - 1 sets - 5 sec hold - 1x daily - 7x weekly Standing Isometric Shoulder Internal Rotation at Doorway - 5 reps - 1 sets - 5 sec hold - 1x daily - 7x weekly Isometric Shoulder Adduction - 5 reps - 1 sets - 5 sec hold - 1x daily - 7x weekly

## 2019-07-19 NOTE — Therapy (Signed)
[place], [place], [address] Phone: [phone]   Fax:  [phone]  Physical Therapy Treatment  Patient Details  Name: Kara Crawford MRN: [id number] Date of Birth: July 11, 1966 Referring Provider (PT): Dr. Theadore Nan   Encounter Date: 07/19/2019  PT End of Session - 07/19/19 0941    Visit Number  21    Number of Visits  28    Date for PT Re-Evaluation  08/02/19    PT Start Time  0838    PT Stop Time  0934    PT Time Calculation (min)  56 min    Activity Tolerance  Patient tolerated treatment well    Behavior During Therapy  [facility] for tasks assessed/performed       Past Medical History:  Diagnosis Date  . Anemia    anemia prior to uterine emboliztion procedure.  . Breast cancer ([place])   . Diabetes mellitus without complication ([place])    type 2  . Family history of breast cancer   . Family history of ovarian cancer   . Family history of prostate cancer   . Fibroids   . Headache   . Heart murmur   . History of radiation therapy 03/07/18- 04/18/18   Right Chest wall 50 Gy in 25 fractions, Right supraclavicular fossa 50 Gy in 25 fractions, Right Chest wall scar boost 10 Gy in 5 fractions.   . Hyperlipidemia   . Hypertension     Past Surgical History:  Procedure Laterality Date  . BREAST SURGERY     double mastectomy  . BREATH TEK H PYLORI N/A 04/17/2014   Procedure: BREATH TEK H PYLORI;  Surgeon: Shann Medal, MD;  Location: [facility];  Service: General;  Laterality: N/A;  . CESAREAN SECTION     x3- normal development  . CYSTOSCOPY N/A 07/18/2018   Procedure: CYSTOSCOPY;  Surgeon: Christophe Louis, MD;  Location: [facility];  Service: Gynecology;  Laterality: N/A;  . ESOPHAGOGASTRODUODENOSCOPY N/A 08/20/2015   Procedure: ESOPHAGOGASTRODUODENOSCOPY (EGD);  Surgeon: Alphonsa Overall, MD;  Location: [facility];  Service: General;  Laterality: N/A;  . EXPLORATORY LAPAROTOMY  05/13/2015  . GASTRIC  ROUX-EN-Y N/A 10/06/2014   Procedure: LAPAROSCOPIC ROUX-EN-Y GASTRIC BYPASS WITH UPPER ENDOSCOPY;  Surgeon: Pedro Earls, MD;  Location: [facility];  Service: General;  Laterality: N/A;  . LAPAROSCOPIC SALPINGO OOPHERECTOMY Bilateral 07/18/2018   Procedure: LAPAROSCOPIC SALPINGO OOPHORECTOMY;  Surgeon: Christophe Louis, MD;  Location: [facility];  Service: Gynecology;  Laterality: Bilateral;  possible laparotomy with abdominal bilateral salpingoophorectomy  . LAPAROSCOPY N/A 05/12/2015   Procedure: LAPAROSCOPY REPAIR OF PERFORATED BOWEL;  Surgeon: Alphonsa Overall, MD;  Location: [facility];  Service: General;  Laterality: N/A;  . MASTECTOMY WITH RADIOACTIVE SEED GUIDED EXCISION AND AXILLARY SENTINEL LYMPH NODE BIOPSY Bilateral 01/22/2018   Procedure: BILATERAL MASTECTOMIES WITH RADIOACTIVE SEED TARGETED RIGHT AXILLARY [place] NODE EXCISION AND RIGHT SENTINEL LYMPH NODE BIOPSY;  Surgeon: Alphonsa Overall, MD;  Location: [facility];  Service: General;  Laterality: Bilateral;  . PORT-A-CATH REMOVAL Left 01/22/2018   Procedure: REMOVAL PORT-A-CATH;  Surgeon: Alphonsa Overall, MD;  Location: [facility];  Service: General;  Laterality: Left;  . PORTACATH PLACEMENT N/A 08/02/2017   Procedure: INSERTION PORT-A-CATH;  Surgeon: Alphonsa Overall, MD;  Location: [facility];  Service: General;  Laterality: N/A;  . UTERINE ARTERY EMBOLIZATION  08-16-2010    There were no vitals filed for this visit.  Subjective Assessment - 07/19/19 0840    Subjective  The  sleeve and glove are doing good. I am starting to get the hang of it. I put the glove on first then the sleeve over the glove.    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 It measures 1 cm and is located in the upper outer quadrant with a Ki67 of 90%. She had 3 abnormal looking axillary nodes on ultrasound and 1 was biopsied and found to be positive, 01/22/18- underwent bilateral mastectomy and a SLNB on  the right side with 6/6 were negative, pt has completed chemotherapy and radiation.  Has been seen here previously for lymphedema and shoulder ROM.      Patient Stated Goals  to get the swelling down    Currently in Pain?  No/denies    Pain Score  0-No pain         [facility] PT Assessment - 07/19/19 0001      AROM   Right Shoulder Internal Rotation  41 Degrees    Left Shoulder Internal Rotation  41 Degrees   with shoulder hiking       LYMPHEDEMA/ONCOLOGY QUESTIONNAIRE - 07/19/19 0845      Right Upper Extremity Lymphedema   15 cm Proximal to Olecranon Process  33.4 cm    Olecranon Process  28.2 cm    15 cm Proximal to Ulnar Styloid Process  29.5 cm    Just Proximal to Ulnar Styloid Process  18.4 cm    Across Hand at [employer]  20.1 cm    At [place] of 2nd Digit  6.5 cm           [facility]patient [facility]  Lymphedema Life Impact Scale Total Score  5.88 %           [facility] Adult PT Treatment/Exercise - 07/19/19 0001      Shoulder Exercises: Isometric Strengthening   Flexion  5X5"    Extension  5X5"    External Rotation  5X5"    Internal Rotation  5X5"    ABduction  5X5"    ADduction  5X5"      Shoulder Exercises: Stretch   Other Shoulder Stretches  Instructed pt in sleeper stretch and had her perform 5 reps holding 30 secs and issued this as part of HEP    Other Shoulder Stretches  standing arm across stretch for posterior capsule stretch x 1 x 30 sec      Manual Therapy   Manual Therapy  Edema management;Joint mobilization;Myofascial release;Passive ROM    Edema Management  remeasured circumferences today and pt demonstrates further decrease. Her wrist decreased from last week so she does not require additional foam at this time    Joint Mobilization  to L shoulder using gentle A-P osscillations, then posterior capsule stretch (kimura mob) to improve shoulder IR with anterior shoulder blocked with 20 sec  holds x 3     Myofascial Release  UE pulling to L shoulder in supine with pt feeling relief with this    Passive ROM  to L shoulder in direction of flexion and IR to pt's tolerance                  PT Long Term Goals - 07/05/19 5681      PT LONG TERM GOAL #1   Title  Pt will be able to internally and externally rotate L shoulder without hiking left shoulder  Baseline  07/05/19- pt able to externally rotate without shoulder hiking but unable to internally rotate    Time  4    Period  Weeks    Status  On-going      PT LONG TERM GOAL #2   Title  Pt will be independent in self MLD for RUE to allow long term management of lymphedema    Baseline  07/05/19- pt is independent with this    Time  4    Period  Weeks    Status  Achieved      PT LONG TERM GOAL #3   Title  Pt will report a 75% improvement in tightness in R axilla and pec with R overhead motion to allow improved comfort    Time  4    Period  Weeks    Status  On-going      PT LONG TERM GOAL #4   Title  Pt will obtain appropriate compression garments for long term management of lymphedema    Baseline  07/05/19- pt obtained garments yesterday    Time  4    Period  Weeks    Status  Achieved      PT LONG TERM GOAL #5   Title  Pt will be independent in a home exercise program for continued strengthening and stretching    Time  4    Period  Weeks    Status  On-going            Plan - 07/19/19 4481    Clinical Impression Statement  Remeasured RUE circumferences today since pt has been wearing the end of the glove under her sleeve instead of on top of it. This appears to have helped because pt demosntrates decreased circumferences throughout and her wrist which was more full at last session has reduced. Focused on improving L shoulder ROM especially IR since this is most limited and uncomfortable. Manual therapy including joint mobs done today to L shoulder to stretch posterior capsule. Instructed pt in posterior  capsule stretches and isometrics to do for left shoulder at home.    Rehab Potential  Good    PT Frequency  2x / week    PT Duration  4 weeks    PT Treatment/Interventions  ADLs/Self Care Home Management;Therapeutic exercise;Patient/family education;Manual techniques;Taping;Manual lymph drainage;Scar mobilization;Compression bandaging;Passive range of motion;Vasopneumatic Device;Joint Manipulations    PT Next Visit Plan  remeasure circumference of RUE- see if she has started using pump, continue L shoulder PROM, scap mobs to L, scar massage and myofascial to R pec    PT Home Exercise Plan  self MLD [email or web address].Access Code: [email or web address]    Consulted and Agree with Plan of Care  Patient       Patient will benefit from skilled therapeutic intervention in order to improve the following deficits and impairments:  Decreased range of motion, Decreased scar mobility, Decreased strength, Pain, Postural dysfunction, Increased edema  Visit Diagnosis: Stiffness of left shoulder, not elsewhere classified  Postmastectomy lymphedema  Muscle weakness (generalized)     Problem List Patient Active Problem List   Diagnosis Date Noted  . Cancer of right breast, stage 2 ([place]) 01/22/2018  . Genetic testing 08/18/2017  . BRCA2 positive 08/18/2017  . Family history of breast cancer   . Family history of ovarian cancer   . Family history of prostate cancer   . Malignant neoplasm of upper-outer quadrant of right breast in female, estrogen receptor negative ([place]) 07/25/2017  . Perforated bowel ([place]) 05/13/2015  .  Free intraperitoneal air 05/12/2015  . Submucosal lesion of esophagus 10/06/2014  . 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 Diabetes ([place]) 09/24/2014  . Morbid obesity ([place]) 03/20/2014    Allyson Sabal [facility] 07/19/2019, 9:46 AM  [place], [place], [address] Phone:  [phone]   Fax:  [phone]  Name: Kara Crawford MRN: [id number] Date of Birth: [birth year]  Manus Gunning, PT 07/19/19 9:46 AM

## 2019-07-23 ENCOUNTER — Other Ambulatory Visit: Payer: Self-pay

## 2019-07-23 ENCOUNTER — Ambulatory Visit: Payer: 59 | Admitting: Physical Therapy

## 2019-07-23 ENCOUNTER — Encounter: Payer: Self-pay | Admitting: Physical Therapy

## 2019-07-23 DIAGNOSIS — I972 Postmastectomy lymphedema syndrome: Secondary | ICD-10-CM | POA: Diagnosis not present

## 2019-07-23 DIAGNOSIS — M6281 Muscle weakness (generalized): Secondary | ICD-10-CM

## 2019-07-23 DIAGNOSIS — M25612 Stiffness of left shoulder, not elsewhere classified: Secondary | ICD-10-CM

## 2019-07-23 NOTE — Therapy (Signed)
[place], [place], [address] Phone: [phone]   Fax:  [phone]  Physical Therapy Treatment  Patient Details  Name: Kara Crawford MRN: [id number] Date of Birth: 06-May-1966 Referring Provider (PT): Dr. Theadore Nan   Encounter Date: 07/23/2019  PT End of Session - 07/23/19 1653    Visit Number  22    Number of Visits  28    Date for PT Re-Evaluation  08/02/19    PT Start Time  9622    PT Stop Time  1645    PT Time Calculation (min)  32 min    Activity Tolerance  Patient tolerated treatment well    Behavior During Therapy  [facility] for tasks assessed/performed       Past Medical History:  Diagnosis Date  . Anemia    anemia prior to uterine emboliztion procedure.  . Breast cancer ([place])   . Diabetes mellitus without complication ([place])    type 2  . Family history of breast cancer   . Family history of ovarian cancer   . Family history of prostate cancer   . Fibroids   . Headache   . Heart murmur   . History of radiation therapy 03/07/18- 04/18/18   Right Chest wall 50 Gy in 25 fractions, Right supraclavicular fossa 50 Gy in 25 fractions, Right Chest wall scar boost 10 Gy in 5 fractions.   . Hyperlipidemia   . Hypertension     Past Surgical History:  Procedure Laterality Date  . BREAST SURGERY     double mastectomy  . BREATH TEK H PYLORI N/A 04/17/2014   Procedure: BREATH TEK H PYLORI;  Surgeon: Shann Medal, MD;  Location: [facility];  Service: General;  Laterality: N/A;  . CESAREAN SECTION     x3- normal development  . CYSTOSCOPY N/A 07/18/2018   Procedure: CYSTOSCOPY;  Surgeon: Christophe Louis, MD;  Location: [facility];  Service: Gynecology;  Laterality: N/A;  . ESOPHAGOGASTRODUODENOSCOPY N/A 08/20/2015   Procedure: ESOPHAGOGASTRODUODENOSCOPY (EGD);  Surgeon: Alphonsa Overall, MD;  Location: [facility];  Service: General;  Laterality: N/A;  . EXPLORATORY LAPAROTOMY  05/13/2015  . GASTRIC  ROUX-EN-Y N/A 10/06/2014   Procedure: LAPAROSCOPIC ROUX-EN-Y GASTRIC BYPASS WITH UPPER ENDOSCOPY;  Surgeon: Pedro Earls, MD;  [facility];  Service: General;  Laterality: N/A;  . LAPAROSCOPIC SALPINGO OOPHERECTOMY Bilateral 07/18/2018   Procedure: LAPAROSCOPIC SALPINGO OOPHORECTOMY;  Surgeon: Christophe Louis, MD;  Location: [facility];  Service: Gynecology;  Laterality: Bilateral;  possible laparotomy with abdominal bilateral salpingoophorectomy  . LAPAROSCOPY N/A 05/12/2015   Procedure: LAPAROSCOPY REPAIR OF PERFORATED BOWEL;  Surgeon: Alphonsa Overall, MD;  Location: [facility];  Service: General;  Laterality: N/A;  . MASTECTOMY WITH RADIOACTIVE SEED GUIDED EXCISION AND AXILLARY SENTINEL LYMPH NODE BIOPSY Bilateral 01/22/2018   Procedure: BILATERAL MASTECTOMIES WITH RADIOACTIVE SEED TARGETED RIGHT AXILLARY [place] NODE EXCISION AND RIGHT SENTINEL LYMPH NODE BIOPSY;  Surgeon: Alphonsa Overall, MD;  Location: [facility];  Service: General;  Laterality: Bilateral;  . PORT-A-CATH REMOVAL Left 01/22/2018   Procedure: REMOVAL PORT-A-CATH;  Surgeon: Alphonsa Overall, MD;  Location: [facility];  Service: General;  Laterality: Left;  . PORTACATH PLACEMENT N/A 08/02/2017   Procedure: INSERTION PORT-A-CATH;  Surgeon: Alphonsa Overall, MD;  Location: [facility];  Service: General;  Laterality: N/A;  . UTERINE ARTERY EMBOLIZATION  08-16-2010    There were no vitals filed for this visit.  Subjective Assessment - 07/23/19 1613    Subjective  I  was not sore at all after last time. My shoulder is moving a little bit better. The stretching is going well. I love the sleeper stretch.    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 It measures 1 cm and is located in the upper outer quadrant with a Ki67 of 90%. She had 3 abnormal looking axillary nodes on ultrasound and 1 was biopsied and found to be positive, 01/22/18- underwent bilateral mastectomy and a  SLNB on the right side with 6/6 were negative, pt has completed chemotherapy and radiation.  Has been seen here previously for lymphedema and shoulder ROM.      Patient Stated Goals  to get the swelling down    Currently in Pain?  No/denies    Pain Score  0-No pain         [facility] PT Assessment - 07/23/19 0001      AROM   Right Shoulder Internal Rotation  45 Degrees              Outpatient Re[facility] from [facility]23/[facility]  Lymphedema Life Impact Scale Total Score  5.88 %           [facility] Adult PT Treatment/Exercise - 07/23/19 0001      Manual Therapy   Manual Therapy  Edema management;Joint mobilization;Myofascial release;Passive ROM;Scapular mobilization    Joint Mobilization  to L shoulder using gentle A-P osscillations, then posterior capsule stretch (kimura mob) to improve shoulder IR with anterior shoulder blocked with 20 sec holds x 3     Myofascial Release  UE pulling to L shoulder in supine with pt feeling relief with this    Scapular Mobilization  to L shoulder in R sidelying in to protraction and retraction    Passive ROM  to L shoulder in direction of flexion and IR to pt's tolerance                  PT Long Term Goals - 07/05/19 0923      PT LONG TERM GOAL #1   Title  Pt will be able to internally and externally rotate L shoulder without hiking left shoulder    Baseline  07/05/19- pt able to externally rotate without shoulder hiking but unable to internally rotate    Time  4    Period  Weeks    Status  On-going      PT LONG TERM GOAL #2   Title  Pt will be independent in self MLD for RUE to allow long term management of lymphedema    Baseline  07/05/19- pt is independent with this    Time  4    Period  Weeks    Status  Achieved      PT LONG TERM GOAL #3   Title  Pt will report a 75% improvement in tightness in R axilla and pec with R overhead motion to allow improved comfort    Time  4    Period  Weeks     Status  On-going      PT LONG TERM GOAL #4   Title  Pt will obtain appropriate compression garments for long term management of lymphedema    Baseline  07/05/19- pt obtained garments yesterday    Time  4    Period  Weeks    Status  Achieved      PT LONG TERM GOAL #5   Title  Pt will be independent in a home exercise program for continued strengthening and stretching    Time  4    Period  Weeks    Status  On-going            Plan - 07/23/19 1653    Clinical Impression Statement  Pt did not have any increased soreness after last session and has been doing the new stretches at home. Remeasured L shoulder ROM today. IR and ER are still very tight. Continued with manual therapy including stretches, joint mobs and scapular mobilizations to improve this today.    Rehab Potential  Good    PT Frequency  2x / week    PT Duration  4 weeks    PT Treatment/Interventions  ADLs/Self Care Home Management;Therapeutic exercise;Patient/family education;Manual techniques;Taping;Manual lymph drainage;Scar mobilization;Compression bandaging;Passive range of motion;Vasopneumatic Device;Joint Manipulations    PT Next Visit Plan  remeasure circumference of RUE- see if she has started using pump, continue L shoulder PROM, scap mobs to L, scar massage and myofascial to R pec    PT Home Exercise Plan  self MLD [email or web address].Access Code: [email or web address]    Consulted and Agree with Plan of Care  Patient       Patient will benefit from skilled therapeutic intervention in order to improve the following deficits and impairments:  Decreased range of motion, Decreased scar mobility, Decreased strength, Pain, Postural dysfunction, Increased edema  Visit Diagnosis: Stiffness of left shoulder, not elsewhere classified  Muscle weakness (generalized)     Problem List Patient Active Problem List   Diagnosis Date Noted  . Cancer of right breast, stage 2 ([place]) 01/22/2018  . Genetic testing 08/18/2017  . BRCA2 positive  08/18/2017  . Family history of breast cancer   . Family history of ovarian cancer   . Family history of prostate cancer   . Malignant neoplasm of upper-outer quadrant of right breast in female, estrogen receptor negative ([place]) 07/25/2017  . Perforated bowel ([place]) 05/13/2015  . Free intraperitoneal air 05/12/2015  . Submucosal lesion of esophagus 10/06/2014  . 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 Diabetes ([place]) 09/24/2014  . Morbid obesity ([place]) 03/20/2014    Allyson Sabal [facility] 07/23/2019, 4:55 PM  [place], [place], [address] Phone: [phone]   Fax:  [phone]  Name: AANYA HAYNES MRN: [id number] Date of Birth: 1966-07-17  Manus Gunning, PT 07/23/19 4:55 PM

## 2019-07-25 ENCOUNTER — Ambulatory Visit: Payer: 59 | Admitting: Physical Therapy

## 2019-07-30 ENCOUNTER — Ambulatory Visit: Payer: 59 | Admitting: Physical Therapy

## 2019-07-30 ENCOUNTER — Other Ambulatory Visit: Payer: Self-pay

## 2019-07-30 ENCOUNTER — Encounter: Payer: Self-pay | Admitting: Physical Therapy

## 2019-07-30 DIAGNOSIS — I972 Postmastectomy lymphedema syndrome: Secondary | ICD-10-CM | POA: Diagnosis not present

## 2019-07-30 DIAGNOSIS — M25612 Stiffness of left shoulder, not elsewhere classified: Secondary | ICD-10-CM

## 2019-07-30 NOTE — Therapy (Signed)
[place], [place], [address] Phone: [phone]   Fax:  [phone]  Physical Therapy Treatment  Patient Details  Name: Kara Crawford MRN: [id number] Date of Birth: 1965/12/12 Referring Provider (PT): Dr. Theadore Nan   Encounter Date: 07/30/2019  PT End of Session - 07/30/19 1549    Visit Number  23    Number of Visits  28    Date for PT Re-Evaluation  08/02/19    PT Start Time  1503    PT Stop Time  1547    PT Time Calculation (min)  44 min    Activity Tolerance  Patient tolerated treatment well    Behavior During Therapy  [facility] for tasks assessed/performed       Past Medical History:  Diagnosis Date  . Anemia    anemia prior to uterine emboliztion procedure.  . Breast cancer ([place])   . Diabetes mellitus without complication ([place])    type 2  . Family history of breast cancer   . Family history of ovarian cancer   . Family history of prostate cancer   . Fibroids   . Headache   . Heart murmur   . History of radiation therapy 03/07/18- 04/18/18   Right Chest wall 50 Gy in 25 fractions, Right supraclavicular fossa 50 Gy in 25 fractions, Right Chest wall scar boost 10 Gy in 5 fractions.   . Hyperlipidemia   . Hypertension     Past Surgical History:  Procedure Laterality Date  . BREAST SURGERY     double mastectomy  . BREATH TEK H PYLORI N/A 04/17/2014   Procedure: BREATH TEK H PYLORI;  Surgeon: Shann Medal, MD;  Location: [facility];  Service: General;  Laterality: N/A;  . CESAREAN SECTION     x3- normal development  . CYSTOSCOPY N/A 07/18/2018   Procedure: CYSTOSCOPY;  Surgeon: Christophe Louis, MD;  Location: [facility];  Service: Gynecology;  Laterality: N/A;  . ESOPHAGOGASTRODUODENOSCOPY N/A 08/20/2015   Procedure: ESOPHAGOGASTRODUODENOSCOPY (EGD);  Surgeon: Alphonsa Overall, MD;  Location: [facility];  Service: General;  Laterality: N/A;  . EXPLORATORY LAPAROTOMY  05/13/2015  . GASTRIC  ROUX-EN-Y N/A 10/06/2014   Procedure: LAPAROSCOPIC ROUX-EN-Y GASTRIC BYPASS WITH UPPER ENDOSCOPY;  Surgeon: Pedro Earls, MD;  Location: [facility];  Service: General;  Laterality: N/A;  . LAPAROSCOPIC SALPINGO OOPHERECTOMY Bilateral 07/18/2018   Procedure: LAPAROSCOPIC SALPINGO OOPHORECTOMY;  Surgeon: Christophe Louis, MD;  Location: [facility];  Service: Gynecology;  Laterality: Bilateral;  possible laparotomy with abdominal bilateral salpingoophorectomy  . LAPAROSCOPY N/A 05/12/2015   Procedure: LAPAROSCOPY REPAIR OF PERFORATED BOWEL;  Surgeon: Alphonsa Overall, MD;  Location: [facility];  Service: General;  Laterality: N/A;  . MASTECTOMY WITH RADIOACTIVE SEED GUIDED EXCISION AND AXILLARY SENTINEL LYMPH NODE BIOPSY Bilateral 01/22/2018   Procedure: BILATERAL MASTECTOMIES WITH RADIOACTIVE SEED TARGETED RIGHT AXILLARY [place] NODE EXCISION AND RIGHT SENTINEL LYMPH NODE BIOPSY;  Surgeon: Alphonsa Overall, MD;  Location: [place];  Service: General;  Laterality: Bilateral;  . PORT-A-CATH REMOVAL Left 01/22/2018   Procedure: REMOVAL PORT-A-CATH;  Surgeon: Alphonsa Overall, MD;  Location: [place];  Service: General;  Laterality: Left;  . PORTACATH PLACEMENT N/A 08/02/2017   Procedure: INSERTION PORT-A-CATH;  Surgeon: Alphonsa Overall, MD;  Location: [place];  Service: General;  Laterality: N/A;  . UTERINE ARTERY EMBOLIZATION  08-16-2010    There were no vitals filed for this visit.  Subjective Assessment - 07/30/19 1507    Subjective  I  have been so busy. I had to take my brother in law to the hospital yesterday. I lost my cousin on Friday. My sleeve is good. I have been doing my pump. I spoke with the lady on Wednesday about my night time garment and tomorrow I need to call and get that taken care of.    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 It measures 1 cm and is located in the upper outer quadrant with a Ki67 of 90%. She had 3  abnormal looking axillary nodes on ultrasound and 1 was biopsied and found to be positive, 01/22/18- underwent bilateral mastectomy and a SLNB on the right side with 6/6 were negative, pt has completed chemotherapy and radiation.  Has been seen here previously for lymphedema and shoulder ROM.      Patient Stated Goals  to get the swelling down    Currently in Pain?  No/denies    Pain Score  0-No pain            LYMPHEDEMA/ONCOLOGY QUESTIONNAIRE - 07/30/19 1511      Right Upper Extremity Lymphedema   15 cm Proximal to Olecranon Process  34 cm    Olecranon Process  28 cm    15 cm Proximal to Ulnar Styloid Process  30.3 cm    Just Proximal to Ulnar Styloid Process  19 cm    Across Hand at [employer]  20 cm    At [place] of 2nd Digit  6.5 cm           [facility]  Lymphedema Life Impact Scale Total Score  5.88 %           [facility] Adult PT Treatment/Exercise - 07/30/19 0001      Manual Therapy   Joint Mobilization  to L shoulder using gentle A-P osscillations    Myofascial Release  UE pulling to L shoulder in supine with pt feeling relief with this    Manual Lymphatic Drainage (MLD)  short neck, superficial and deep abdominals, left axillary nodes and establishment of interaxillary pathway, right inguinal nodes and establishment of axillo inguinal pathway, right UE working proximal to distal then retracing all steps    Passive ROM  to L shoulder in direction of ER and IR to pt's tolerance                  PT Long Term Goals - 07/05/19 8110      PT LONG TERM GOAL #1   Title  Pt will be able to internally and externally rotate L shoulder without hiking left shoulder    Baseline  07/05/19- pt able to externally rotate without shoulder hiking but unable to internally rotate    Time  4    Period  Weeks    Status  On-going      PT LONG TERM GOAL #2   Title  Pt will be independent in self MLD for  RUE to allow long term management of lymphedema    Baseline  07/05/19- pt is independent with this    Time  4    Period  Weeks    Status  Achieved      PT LONG TERM GOAL #3   Title  Pt will report a 75% improvement in tightness in R axilla and pec with R overhead motion to allow improved comfort    Time  4    Period  Weeks    Status  On-going      PT LONG TERM GOAL #4   Title  Pt will obtain appropriate compression garments for long term management of lymphedema    Baseline  07/05/19- pt obtained garments yesterday    Time  4    Period  Weeks    Status  Achieved      PT LONG TERM GOAL #5   Title  Pt will be independent in a home exercise program for continued strengthening and stretching    Time  4    Period  Weeks    Status  On-going            Plan - 07/30/19 1549    Clinical Impression Statement  Remeasured pt's circumferences today since she has been wearing her compression sleeve and they went up slightly so performed MLD today to RUE. Pt is going to call tomorrow to order her night time garment. Continued with gentle joint mobs and PROM to improve L shoulder IR and ER.    Rehab Potential  Good    PT Frequency  2x / week    PT Duration  4 weeks    PT Treatment/Interventions  ADLs/Self Care Home Management;Therapeutic exercise;Patient/family education;Manual techniques;Taping;Manual lymph drainage;Scar mobilization;Compression bandaging;Passive range of motion;Vasopneumatic Device;Joint Manipulations    PT Next Visit Plan  remeasure circumference of RUE, see if she ordered her night time garment, continue L shoulder PROM, scap mobs to L, scar massage and myofascial to R pec    PT Home Exercise Plan  self MLD [email or web address] Code: [email or web address]    Consulted and Agree with Plan of Care  Patient       Patient will benefit from skilled therapeutic intervention in order to improve the following deficits and impairments:  Decreased range of motion, Decreased scar mobility, Decreased  strength, Pain, Postural dysfunction, Increased edema  Visit Diagnosis: Stiffness of left shoulder, not elsewhere classified  Postmastectomy lymphedema     Problem List Patient Active Problem List   Diagnosis Date Noted  . Cancer of right breast, stage 2 ([place]) 01/22/2018  . Genetic testing 08/18/2017  . BRCA2 positive 08/18/2017  . Family history of breast cancer   . Family history of ovarian cancer   . Family history of prostate cancer   . Malignant neoplasm of upper-outer quadrant of right breast in female, estrogen receptor negative ([place]) 07/25/2017  . Perforated bowel ([place]) 05/13/2015  . Free intraperitoneal air 05/12/2015  . Submucosal lesion of esophagus 10/06/2014  . 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 Diabetes ([place]) 09/24/2014  . Morbid obesity ([place]) 03/20/2014    Allyson Sabal [facility] 07/30/2019, 3:51 PM  [place], [place], [address] Phone: [phone]   Fax:  [phone]  Name: JULISA FLIPPO MRN: [id number] Date of Birth: 1965-11-22  Manus Gunning, PT 07/30/19 3:52 PM

## 2019-08-02 ENCOUNTER — Encounter: Payer: Self-pay | Admitting: Physical Therapy

## 2019-08-02 ENCOUNTER — Other Ambulatory Visit: Payer: Self-pay

## 2019-08-02 ENCOUNTER — Ambulatory Visit: Payer: 59 | Attending: Family Medicine | Admitting: Physical Therapy

## 2019-08-02 DIAGNOSIS — M6281 Muscle weakness (generalized): Secondary | ICD-10-CM

## 2019-08-02 DIAGNOSIS — L599 Disorder of the skin and subcutaneous tissue related to radiation, unspecified: Secondary | ICD-10-CM | POA: Insufficient documentation

## 2019-08-02 DIAGNOSIS — G8929 Other chronic pain: Secondary | ICD-10-CM | POA: Diagnosis present

## 2019-08-02 DIAGNOSIS — M25512 Pain in left shoulder: Secondary | ICD-10-CM | POA: Insufficient documentation

## 2019-08-02 DIAGNOSIS — M25611 Stiffness of right shoulder, not elsewhere classified: Secondary | ICD-10-CM | POA: Insufficient documentation

## 2019-08-02 DIAGNOSIS — I972 Postmastectomy lymphedema syndrome: Secondary | ICD-10-CM | POA: Insufficient documentation

## 2019-08-02 DIAGNOSIS — M25612 Stiffness of left shoulder, not elsewhere classified: Secondary | ICD-10-CM | POA: Diagnosis not present

## 2019-08-02 DIAGNOSIS — R293 Abnormal posture: Secondary | ICD-10-CM | POA: Insufficient documentation

## 2019-08-02 DIAGNOSIS — M25511 Pain in right shoulder: Secondary | ICD-10-CM | POA: Insufficient documentation

## 2019-08-02 NOTE — Therapy (Signed)
[place], [place], [address] Phone: [phone]   Fax:  [phone]  Physical Therapy Treatment  Patient Details  Name: Kara Crawford MRN: [id number] Date of Birth: 1966/01/12 Referring Provider (PT): Dr. Theadore Nan   Encounter Date: 08/02/2019  PT End of Session - 08/02/19 1129    Visit Number  24    Date for PT Re-Evaluation  08/02/19    PT Start Time  1036    PT Stop Time  1117    PT Time Calculation (min)  41 min    Activity Tolerance  Patient tolerated treatment well    Behavior During Therapy  [facility] for tasks assessed/performed       Past Medical History:  Diagnosis Date  . Anemia    anemia prior to uterine emboliztion procedure.  . Breast cancer ([place])   . Diabetes mellitus without complication ([place])    type 2  . Family history of breast cancer   . Family history of ovarian cancer   . Family history of prostate cancer   . Fibroids   . Headache   . Heart murmur   . History of radiation therapy 03/07/18- 04/18/18   Right Chest wall 50 Gy in 25 fractions, Right supraclavicular fossa 50 Gy in 25 fractions, Right Chest wall scar boost 10 Gy in 5 fractions.   . Hyperlipidemia   . Hypertension     Past Surgical History:  Procedure Laterality Date  . BREAST SURGERY     double mastectomy  . BREATH TEK H PYLORI N/A 04/17/2014   Procedure: BREATH TEK H PYLORI;  Surgeon: Shann Medal, MD;  Location: [facility];  Service: General;  Laterality: N/A;  . CESAREAN SECTION     x3- normal development  . CYSTOSCOPY N/A 07/18/2018   Procedure: CYSTOSCOPY;  Surgeon: Christophe Louis, MD;  Location: [facility];  Service: Gynecology;  Laterality: N/A;  . ESOPHAGOGASTRODUODENOSCOPY N/A 08/20/2015   Procedure: ESOPHAGOGASTRODUODENOSCOPY (EGD);  Surgeon: Alphonsa Overall, MD;  Location: [facility];  Service: General;  Laterality: N/A;  . EXPLORATORY LAPAROTOMY  05/13/2015  . GASTRIC ROUX-EN-Y N/A 10/06/2014   Procedure: LAPAROSCOPIC ROUX-EN-Y GASTRIC BYPASS WITH UPPER ENDOSCOPY;  Surgeon: Pedro Earls, MD;  Location: [facility];  Service: General;  Laterality: N/A;  . LAPAROSCOPIC SALPINGO OOPHERECTOMY Bilateral 07/18/2018   Procedure: LAPAROSCOPIC SALPINGO OOPHORECTOMY;  Surgeon: Christophe Louis, MD;  Location: [facility];  Service: Gynecology;  Laterality: Bilateral;  possible laparotomy with abdominal bilateral salpingoophorectomy  . LAPAROSCOPY N/A 05/12/2015   Procedure: LAPAROSCOPY REPAIR OF PERFORATED BOWEL;  Surgeon: Alphonsa Overall, MD;  Location: [place];  Service: General;  Laterality: N/A;  . MASTECTOMY WITH RADIOACTIVE SEED GUIDED EXCISION AND AXILLARY SENTINEL LYMPH NODE BIOPSY Bilateral 01/22/2018   Procedure: BILATERAL MASTECTOMIES WITH RADIOACTIVE SEED TARGETED RIGHT AXILLARY [place] NODE EXCISION AND RIGHT SENTINEL LYMPH NODE BIOPSY;  Surgeon: Alphonsa Overall, MD;  Location: [place];  Service: General;  Laterality: Bilateral;  . PORT-A-CATH REMOVAL Left 01/22/2018   Procedure: REMOVAL PORT-A-CATH;  Surgeon: Alphonsa Overall, MD;  Location: [place];  Service: General;  Laterality: Left;  . PORTACATH PLACEMENT N/A 08/02/2017   Procedure: INSERTION PORT-A-CATH;  Surgeon: Alphonsa Overall, MD;  Location: [place];  Service: General;  Laterality: N/A;  . UTERINE ARTERY EMBOLIZATION  08-16-2010    There were no vitals filed for this visit.  Subjective Assessment - 08/02/19 1039    Subjective  I have been trying to call [employer] about my night  garment and I am waiting for them to call me back.    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 It measures 1 cm and is located in the upper outer quadrant with a Ki67 of 90%. She had 3 abnormal looking axillary nodes on ultrasound and 1 was biopsied and found to be positive, 01/22/18- underwent bilateral mastectomy and a SLNB on the right side with 6/6 were negative, pt has completed  chemotherapy and radiation.  Has been seen here previously for lymphedema and shoulder ROM.      Patient Stated Goals  to get the swelling down    Currently in Pain?  No/denies    Pain Score  0-No pain            LYMPHEDEMA/ONCOLOGY QUESTIONNAIRE - 08/02/19 1040      Right Upper Extremity Lymphedema   15 cm Proximal to Olecranon Process  33.7 cm    Olecranon Process  27.5 cm    15 cm Proximal to Ulnar Styloid Process  29.5 cm    Just Proximal to Ulnar Styloid Process  19 cm    Across Hand at [employer]  18.9 cm    At [place] of 2nd Digit  6.5 cm           Outpatient Re[facility] from [facility]  Lymphedema Life Impact Scale Total Score  5.88 %           [facility] Adult PT Treatment/Exercise - 08/02/19 0001      Manual Therapy   Joint Mobilization  to L shoulder using gentle A-P osscillations, then posterior capsule stretch (kimura mob) to improve shoulder IR with anterior shoulder blocked with 20 sec holds x 3     Myofascial Release  UE pulling to L shoulder in supine with pt feeling relief with this    Scapular Mobilization  to L shoulder in R sidelying in to protraction and retraction    Passive ROM  to L shoulder in direction of ER and IR to pt's tolerance                  PT Long Term Goals - 07/05/19 8502      PT LONG TERM GOAL #1   Title  Pt will be able to internally and externally rotate L shoulder without hiking left shoulder    Baseline  07/05/19- pt able to externally rotate without shoulder hiking but unable to internally rotate    Time  4    Period  Weeks    Status  On-going      PT LONG TERM GOAL #2   Title  Pt will be independent in self MLD for RUE to allow long term management of lymphedema    Baseline  07/05/19- pt is independent with this    Time  4    Period  Weeks    Status  Achieved      PT LONG TERM GOAL #3   Title  Pt will report a 75% improvement in tightness in R axilla and pec with  R overhead motion to allow improved comfort    Time  4    Period  Weeks    Status  On-going      PT LONG TERM GOAL #4   Title  Pt will obtain appropriate compression garments for long term management of lymphedema    Baseline  07/05/19- pt obtained  garments yesterday    Time  4    Period  Weeks    Status  Achieved      PT LONG TERM GOAL #5   Title  Pt will be independent in a home exercise program for continued strengthening and stretching    Time  4    Period  Weeks    Status  On-going            Plan - 08/02/19 1130    Clinical Impression Statement  Remeasured circumference of LUE today and pt demonstrates reduction with wearing her compression sleeve. She is waiting to hear back from [employer] about night time garment. It is ready to ship she just needs to provide payment. Continued with joint mobs, scapular mobs and PROM to L shoulder to improve ROM. Pt is still very limited with ER and IR. Will put pt on hold for 3 weeks and then reassess her shoulder and see if she has received night time garment. Pt wants to see if she can manage her L shoulder independently.    PT Frequency  2x / week   on hold for 3 weeks   PT Duration  4 weeks    PT Treatment/Interventions  ADLs/Self Care Home Management;Therapeutic exercise;Patient/family education;Manual techniques;Taping;Manual lymph drainage;Scar mobilization;Compression bandaging;Passive range of motion;Vasopneumatic Device;Joint Manipulations    PT Next Visit Plan  see if pt wants to continue or d/c, remeasure circumference of RUE, see if she ordered her night time garment, continue L shoulder PROM, scap mobs to L, scar massage and myofascial to R pec    PT Home Exercise Plan  self MLD [email or web address].Access Code: [email or web address]    Consulted and Agree with Plan of Care  Patient       Patient will benefit from skilled therapeutic intervention in order to improve the following deficits and impairments:  Decreased range of motion, Decreased scar  mobility, Decreased strength, Pain, Postural dysfunction, Increased edema  Visit Diagnosis: Stiffness of left shoulder, not elsewhere classified  Muscle weakness (generalized)     Problem List Patient Active Problem List   Diagnosis Date Noted  . Cancer of right breast, stage 2 ([place]) 01/22/2018  . Genetic testing 08/18/2017  . BRCA2 positive 08/18/2017  . Family history of breast cancer   . Family history of ovarian cancer   . Family history of prostate cancer   . Malignant neoplasm of upper-outer quadrant of right breast in female, estrogen receptor negative ([place]) 07/25/2017  . Perforated bowel ([place]) 05/13/2015  . Free intraperitoneal air 05/12/2015  . Submucosal lesion of esophagus 10/06/2014  . 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 Diabetes ([place]) 09/24/2014  . Morbid obesity ([place]) 03/20/2014    Allyson Sabal [facility] 08/02/2019, 11:33 AM  [place], [place], [address] Phone: [phone]   Fax:  [phone]  Name: Kara Crawford MRN: [id number] Date of Birth: [birth year]  Manus Gunning, PT 08/02/19 11:33 AM

## 2019-08-20 ENCOUNTER — Encounter: Payer: Self-pay | Admitting: Physical Therapy

## 2019-08-20 ENCOUNTER — Ambulatory Visit: Payer: 59 | Admitting: Physical Therapy

## 2019-08-20 ENCOUNTER — Other Ambulatory Visit: Payer: Self-pay

## 2019-08-20 DIAGNOSIS — M25612 Stiffness of left shoulder, not elsewhere classified: Secondary | ICD-10-CM

## 2019-08-20 DIAGNOSIS — G8929 Other chronic pain: Secondary | ICD-10-CM

## 2019-08-20 DIAGNOSIS — M25611 Stiffness of right shoulder, not elsewhere classified: Secondary | ICD-10-CM

## 2019-08-20 DIAGNOSIS — M6281 Muscle weakness (generalized): Secondary | ICD-10-CM

## 2019-08-20 DIAGNOSIS — R293 Abnormal posture: Secondary | ICD-10-CM

## 2019-08-20 DIAGNOSIS — L599 Disorder of the skin and subcutaneous tissue related to radiation, unspecified: Secondary | ICD-10-CM

## 2019-08-20 DIAGNOSIS — M25511 Pain in right shoulder: Secondary | ICD-10-CM

## 2019-08-20 DIAGNOSIS — M25512 Pain in left shoulder: Secondary | ICD-10-CM

## 2019-08-20 DIAGNOSIS — I972 Postmastectomy lymphedema syndrome: Secondary | ICD-10-CM

## 2019-08-20 NOTE — Therapy (Signed)
[place], [place], [address] Phone: [phone]   Fax:  [phone]  Physical Therapy Treatment  Patient Details  Name: Kara Crawford MRN: [id number] Date of Birth: 12/24/1965 Referring Provider (PT): Dr. Theadore Nan   Encounter Date: 08/20/2019  PT End of Session - 08/20/19 1355    Visit Number  25    Number of Visits  28    Date for PT Re-Evaluation  09/17/19    PT Start Time  6468   pt arrived late   PT Stop Time  1350    PT Time Calculation (min)  42 min    Activity Tolerance  Patient tolerated treatment well    Behavior During Therapy  [facility] for tasks assessed/performed       Past Medical History:  Diagnosis Date  . Anemia    anemia prior to uterine emboliztion procedure.  . Breast cancer ([place])   . Diabetes mellitus without complication ([place])    type 2  . Family history of breast cancer   . Family history of ovarian cancer   . Family history of prostate cancer   . Fibroids   . Headache   . Heart murmur   . History of radiation therapy 03/07/18- 04/18/18   Right Chest wall 50 Gy in 25 fractions, Right supraclavicular fossa 50 Gy in 25 fractions, Right Chest wall scar boost 10 Gy in 5 fractions.   . Hyperlipidemia   . Hypertension     Past Surgical History:  Procedure Laterality Date  . BREAST SURGERY     double mastectomy  . BREATH TEK H PYLORI N/A 04/17/2014   Procedure: BREATH TEK H PYLORI;  Surgeon: Shann Medal, MD;  Location: [facility];  Service: General;  Laterality: N/A;  . CESAREAN SECTION     x3- normal development  . CYSTOSCOPY N/A 07/18/2018   Procedure: CYSTOSCOPY;  Surgeon: Christophe Louis, MD;  Location: [facility];  Service: Gynecology;  Laterality: N/A;  . ESOPHAGOGASTRODUODENOSCOPY N/A 08/20/2015   Procedure: ESOPHAGOGASTRODUODENOSCOPY (EGD);  Surgeon: Alphonsa Overall, MD;  Location: [facility];  Service: General;  Laterality: N/A;  . EXPLORATORY LAPAROTOMY  05/13/2015   . GASTRIC ROUX-EN-Y N/A 10/06/2014   Procedure: LAPAROSCOPIC ROUX-EN-Y GASTRIC BYPASS WITH UPPER ENDOSCOPY;  Surgeon: Pedro Earls, MD;  Location: [facility];  Service: General;  Laterality: N/A;  . LAPAROSCOPIC SALPINGO OOPHERECTOMY Bilateral 07/18/2018   Procedure: LAPAROSCOPIC SALPINGO OOPHORECTOMY;  Surgeon: Christophe Louis, MD;  Location: [facility];  Service: Gynecology;  Laterality: Bilateral;  possible laparotomy with abdominal bilateral salpingoophorectomy  . LAPAROSCOPY N/A 05/12/2015   Procedure: LAPAROSCOPY REPAIR OF PERFORATED BOWEL;  Surgeon: Alphonsa Overall, MD;  Location: [facility];  Service: General;  Laterality: N/A;  . MASTECTOMY WITH RADIOACTIVE SEED GUIDED EXCISION AND AXILLARY SENTINEL LYMPH NODE BIOPSY Bilateral 01/22/2018   Procedure: BILATERAL MASTECTOMIES WITH RADIOACTIVE SEED TARGETED RIGHT AXILLARY [place] NODE EXCISION AND RIGHT SENTINEL LYMPH NODE BIOPSY;  Surgeon: Alphonsa Overall, MD;  Location: [facility];  Service: General;  Laterality: Bilateral;  . PORT-A-CATH REMOVAL Left 01/22/2018   Procedure: REMOVAL PORT-A-CATH;  Surgeon: Alphonsa Overall, MD;  Location: [facility];  Service: General;  Laterality: Left;  . PORTACATH PLACEMENT N/A 08/02/2017   Procedure: INSERTION PORT-A-CATH;  Surgeon: Alphonsa Overall, MD;  Location: [facility];  Service: General;  Laterality: N/A;  . UTERINE ARTERY EMBOLIZATION  08-16-2010    There were no vitals filed for this visit.  Subjective Assessment - 08/20/19 1312  Subjective  My night time garment still has not come yet. My shoulders are good. The only problem I have is when I wrap my arm at night I get tense in my shoulder.    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 It measures 1 cm and is located in the upper outer quadrant with a Ki67 of 90%. She had 3 abnormal looking axillary nodes on ultrasound and 1 was biopsied and found to be positive, 01/22/18- underwent  bilateral mastectomy and a SLNB on the right side with 6/6 were negative, pt has completed chemotherapy and radiation.  Has been seen here previously for lymphedema and shoulder ROM.      Patient Stated Goals  to get the swelling down    Currently in Pain?  No/denies    Pain Score  0-No pain            LYMPHEDEMA/ONCOLOGY QUESTIONNAIRE - 08/20/19 1317      Right Upper Extremity Lymphedema   15 cm Proximal to Olecranon Process  34.7 cm    Olecranon Process  29.3 cm    15 cm Proximal to Ulnar Styloid Process  30 cm    Just Proximal to Ulnar Styloid Process  17.6 cm    Across Hand at [employer]  20.5 cm    At [place] of 2nd Digit  6.8 cm           [facility]patient [facility]  Lymphedema Life Impact Scale Total Score  5.88 %           [facility] Adult PT Treatment/Exercise - 08/20/19 0001      Manual Therapy   Edema Management  circumferential measurements taken today    Manual Lymphatic Drainage (MLD)  short neck, superficial and deep abdominals, left axillary nodes and establishment of interaxillary pathway, right inguinal nodes and establishment of axillo inguinal pathway, right UE working proximal to distal then retracing all steps                  PT Long Term Goals - 08/20/19 1314      PT LONG TERM GOAL #1   Title  Pt will be able to internally and externally rotate L shoulder without hiking left shoulder    Baseline  07/05/19- pt able to externally rotate without shoulder hiking but unable to internally rotate, 08/20/19- pt able to do without shoulder hiking    Time  4    Period  Weeks    Status  Achieved      PT LONG TERM GOAL #2   Title  Pt will be independent in self MLD for RUE to allow long term management of lymphedema    Baseline  07/05/19- pt is independent with this    Period  Weeks    Status  Achieved      PT LONG TERM GOAL #3   Title  Pt will report a 75% improvement in tightness in R  axilla and pec with R overhead motion to allow improved comfort    Baseline  08/20/19- 90%    Time  4    Period  Weeks    Status  Achieved      PT LONG TERM GOAL #4   Title  Pt will obtain appropriate compression garments for long term management of lymphedema    Baseline  07/05/19- pt obtained garments yesterday, 08/20/19- still awaiting  night time garments    Time  4    Period  Weeks    Status  On-going      PT LONG TERM GOAL #5   Title  Pt will be independent in a home exercise program for continued strengthening and stretching    Time  4    Period  Weeks    Status  Achieved            Plan - 08/20/19 1356    Clinical Impression Statement  Pt is no longer having pain with her L shoulder. She has met her goals associated with it. She is still awaiting arrival of her night time garment and has been wearing her sleeve during the day and bandaging at night. Circumferential measurements taken today demonstrate an increase throughout RUE. Focused on MLD to RUE today. Pt would benefit from at least 1 additional skilled PT visit in about 2 weeks to check night time garment for fit and re measure circumferences to ensure that pt is able to maintain reductions gained since eval.    Rehab Potential  Good    PT Frequency  Biweekly    PT Duration  4 weeks    PT Treatment/Interventions  ADLs/Self Care Home Management;Therapeutic exercise;Patient/family education;Manual techniques;Taping;Manual lymph drainage;Scar mobilization;Compression bandaging;Passive range of motion;Vasopneumatic Device;Joint Manipulations    PT Next Visit Plan  see if pt wants to continue or d/c, remeasure circumference of RUE, see if she received her night time garment,    PT Home Exercise Plan  self MLD [email or web address].Access Code: [email or web address]    Consulted and Agree with Plan of Care  Patient       Patient will benefit from skilled therapeutic intervention in order to improve the following deficits and impairments:  Decreased  range of motion, Decreased scar mobility, Decreased strength, Pain, Postural dysfunction, Increased edema  Visit Diagnosis: Postmastectomy lymphedema  Stiffness of right shoulder, not elsewhere classified  Stiffness of left shoulder, not elsewhere classified  Muscle weakness (generalized)  Disorder of the skin and subcutaneous tissue related to radiation, unspecified  Abnormal posture  Chronic left shoulder pain  Acute pain of right shoulder     Problem List Patient Active Problem List   Diagnosis Date Noted  . Cancer of right breast, stage 2 ([place]) 01/22/2018  . Genetic testing 08/18/2017  . BRCA2 positive 08/18/2017  . Family history of breast cancer   . Family history of ovarian cancer   . Family history of prostate cancer   . Malignant neoplasm of upper-outer quadrant of right breast in female, estrogen receptor negative ([place]) 07/25/2017  . Perforated bowel ([place]) 05/13/2015  . Free intraperitoneal air 05/12/2015  . Submucosal lesion of esophagus 10/06/2014  . 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 Diabetes ([place]) 09/24/2014  . Morbid obesity ([place]) 03/20/2014    Allyson Sabal [facility] 08/20/2019, 2:00 PM  [place], [place], [address] Phone: [phone]   Fax:  [phone]  Name: Kara Crawford MRN: [id number] Date of Birth: Jun 10, 1966  Manus Gunning, PT 08/20/19 2:00 PM

## 2019-09-03 ENCOUNTER — Other Ambulatory Visit: Payer: Self-pay

## 2019-09-03 ENCOUNTER — Ambulatory Visit: Payer: 59 | Attending: Family Medicine | Admitting: Physical Therapy

## 2019-09-03 ENCOUNTER — Encounter: Payer: Self-pay | Admitting: Physical Therapy

## 2019-09-03 DIAGNOSIS — I972 Postmastectomy lymphedema syndrome: Secondary | ICD-10-CM | POA: Diagnosis present

## 2019-09-03 NOTE — Therapy (Signed)
[place], [place], [address] Phone: [phone]   Fax:  [phone]  Physical Therapy Treatment  Patient Details  Name: Kara Crawford MRN: [id number] Date of Birth: 22-May-1966 Referring Provider (PT): Dr. Theadore Nan   Encounter Date: 09/03/2019  PT End of Session - 09/03/19 0902    Visit Number  26    Number of Visits  28    Date for PT Re-Evaluation  09/17/19    PT Start Time  0810    PT Stop Time  0855    PT Time Calculation (min)  45 min    Activity Tolerance  Patient tolerated treatment well    Behavior During Therapy  [facility] for tasks assessed/performed       Past Medical History:  Diagnosis Date  . Anemia    anemia prior to uterine emboliztion procedure.  . Breast cancer ([place])   . Diabetes mellitus without complication ([place])    type 2  . Family history of breast cancer   . Family history of ovarian cancer   . Family history of prostate cancer   . Fibroids   . Headache   . Heart murmur   . History of radiation therapy 03/07/18- 04/18/18   Right Chest wall 50 Gy in 25 fractions, Right supraclavicular fossa 50 Gy in 25 fractions, Right Chest wall scar boost 10 Gy in 5 fractions.   . Hyperlipidemia   . Hypertension     Past Surgical History:  Procedure Laterality Date  . BREAST SURGERY     double mastectomy  . BREATH TEK H PYLORI N/A 04/17/2014   Procedure: BREATH TEK H PYLORI;  Surgeon: Shann Medal, MD;  Location: [facility];  Service: General;  Laterality: N/A;  . CESAREAN SECTION     x3- normal development  . CYSTOSCOPY N/A 07/18/2018   Procedure: CYSTOSCOPY;  Surgeon: Christophe Louis, MD;  Location: [facility];  Service: Gynecology;  Laterality: N/A;  . ESOPHAGOGASTRODUODENOSCOPY N/A 08/20/2015   Procedure: ESOPHAGOGASTRODUODENOSCOPY (EGD);  Surgeon: Alphonsa Overall, MD;  Location: [facility];  Service: General;  Laterality: N/A;  . EXPLORATORY LAPAROTOMY  05/13/2015  . GASTRIC  ROUX-EN-Y N/A 10/06/2014   Procedure: LAPAROSCOPIC ROUX-EN-Y GASTRIC BYPASS WITH UPPER ENDOSCOPY;  Surgeon: Pedro Earls, MD;  [facility];  Service: General;  Laterality: N/A;  . LAPAROSCOPIC SALPINGO OOPHERECTOMY Bilateral 07/18/2018   Procedure: LAPAROSCOPIC SALPINGO OOPHORECTOMY;  Surgeon: Christophe Louis, MD;  Location: [facility];  Service: Gynecology;  Laterality: Bilateral;  possible laparotomy with abdominal bilateral salpingoophorectomy  . LAPAROSCOPY N/A 05/12/2015   Procedure: LAPAROSCOPY REPAIR OF PERFORATED BOWEL;  Surgeon: Alphonsa Overall, MD;  Location: [facility];  Service: General;  Laterality: N/A;  . MASTECTOMY WITH RADIOACTIVE SEED GUIDED EXCISION AND AXILLARY SENTINEL LYMPH NODE BIOPSY Bilateral 01/22/2018   Procedure: BILATERAL MASTECTOMIES WITH RADIOACTIVE SEED TARGETED RIGHT AXILLARY [place] NODE EXCISION AND RIGHT SENTINEL LYMPH NODE BIOPSY;  Surgeon: Alphonsa Overall, MD;  Location: [facility];  Service: General;  Laterality: Bilateral;  . PORT-A-CATH REMOVAL Left 01/22/2018   Procedure: REMOVAL PORT-A-CATH;  Surgeon: Alphonsa Overall, MD;  Location: [facility];  Service: General;  Laterality: Left;  . PORTACATH PLACEMENT N/A 08/02/2017   Procedure: INSERTION PORT-A-CATH;  Surgeon: Alphonsa Overall, MD;  Location: [facility];  Service: General;  Laterality: N/A;  . UTERINE ARTERY EMBOLIZATION  08-16-2010    There were no vitals filed for this visit.  Subjective Assessment - 09/03/19 0811    Subjective  I  have not been able to figure out the night time garment so I brought it in.    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 It measures 1 cm and is located in the upper outer quadrant with a Ki67 of 90%. She had 3 abnormal looking axillary nodes on ultrasound and 1 was biopsied and found to be positive, 01/22/18- underwent bilateral mastectomy and a SLNB on the right side with 6/6 were negative, pt has  completed chemotherapy and radiation.  Has been seen here previously for lymphedema and shoulder ROM.      Patient Stated Goals  to get the swelling down    Currently in Pain?  No/denies    Pain Score  0-No pain            LYMPHEDEMA/ONCOLOGY QUESTIONNAIRE - 09/03/19 0819      Right Upper Extremity Lymphedema   15 cm Proximal to Olecranon Process  34 cm    Olecranon Process  28.1 cm    15 cm Proximal to Ulnar Styloid Process  29.3 cm    Just Proximal to Ulnar Styloid Process  18.4 cm    Across Hand at [employer]  20.1 cm    At [place] of 2nd Digit  6.7 cm           Outpatient Re[facility] from [facility]  Lymphedema Life Impact Scale Total Score  5.88 %           [facility] Adult PT Treatment/Exercise - 09/03/19 0001      Manual Therapy   Edema Management  circumferential measurements taken today, educated pt on correct way to don/doff night time garment and had pt return demonstrate    Manual Lymphatic Drainage (MLD)  short neck, 5 diaphragmatic breaths,  left axillary nodes and establishment of interaxillary pathway, right inguinal nodes and establishment of axillo inguinal pathway, right UE working proximal to distal then retracing all steps                  PT Long Term Goals - 09/03/19 0901      PT LONG TERM GOAL #1   Title  Pt will be able to internally and externally rotate L shoulder without hiking left shoulder    Baseline  07/05/19- pt able to externally rotate without shoulder hiking but unable to internally rotate, 08/20/19- pt able to do without shoulder hiking    Time  4    Period  Weeks    Status  Achieved      PT LONG TERM GOAL #2   Title  Pt will be independent in self MLD for RUE to allow long term management of lymphedema    Baseline  07/05/19- pt is independent with this    Time  4    Period  Weeks    Status  Achieved      PT LONG TERM GOAL #3   Title  Pt will report a 75% improvement in  tightness in R axilla and pec with R overhead motion to allow improved comfort    Baseline  08/20/19- 90%    Time  4    Period  Weeks    Status  Achieved      PT LONG TERM GOAL #4   Title  Pt will obtain appropriate compression garments for long term management of lymphedema    Baseline  07/05/19- pt obtained garments yesterday,  08/20/19- still awaiting night time garments, 09/03/19-pt received night time garments and was educated in correct donning/doffing technique    Time  4    Status  Achieved      PT LONG TERM GOAL #5   Title  Pt will be independent in a home exercise program for continued strengthening and stretching    Time  4    Period  Weeks    Status  Achieved            Plan - 09/03/19 0903    Clinical Impression Statement  Pt received her night time garment and brought it to her appointment today. Pt was unsure of correct way to don/doff garment. Educated pt in this today and she was able to return demonstrate independently. Remeasured pt's circumference and most places had decreased since last session. Pt has been using her compression pump and has been compliant with wearing her compression garments. She has now met all goals for therapy and is ready for discharge from skilled PT services.    PT Frequency  Biweekly    PT Duration  4 weeks    PT Treatment/Interventions  ADLs/Self Care Home Management;Therapeutic exercise;Patient/family education;Manual techniques;Taping;Manual lymph drainage;Scar mobilization;Compression bandaging;Passive range of motion;Vasopneumatic Device;Joint Manipulations    PT Next Visit Plan  d/c this visit    PT Home Exercise Plan  self MLD [email or web address].Access Code: [email or web address]    Consulted and Agree with Plan of Care  Patient       Patient will benefit from skilled therapeutic intervention in order to improve the following deficits and impairments:  Decreased range of motion, Decreased scar mobility, Decreased strength, Pain, Postural dysfunction,  Increased edema  Visit Diagnosis: Postmastectomy lymphedema     Problem List Patient Active Problem List   Diagnosis Date Noted  . Cancer of right breast, stage 2 ([place]) 01/22/2018  . Genetic testing 08/18/2017  . BRCA2 positive 08/18/2017  . Family history of breast cancer   . Family history of ovarian cancer   . Family history of prostate cancer   . Malignant neoplasm of upper-outer quadrant of right breast in female, estrogen receptor negative ([place]) 07/25/2017  . Perforated bowel ([place]) 05/13/2015  . Free intraperitoneal air 05/12/2015  . Submucosal lesion of esophagus 10/06/2014  . 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 Diabetes ([place]) 09/24/2014  . Morbid obesity ([place]) 03/20/2014    Allyson Sabal [facility] 09/03/2019, [place], [place], [address] Phone: [phone]   Fax:  [phone]  Name: Kara Crawford MRN: [id number] Date of Birth: December 09, 1965  Manus Gunning, PT 09/03/19 9:05 AM  PHYSICAL THERAPY DISCHARGE SUMMARY  Visits from Start of Care: 26 Current functional level related to goals / functional outcomes: All goals met   Remaining deficits: None   Education / Equipment: HEP, compression garments, self MLD, compression pump  Plan: Patient agrees to discharge.  Patient goals were met. Patient is being discharged due to meeting the stated rehab goals.  ?????    Allyson Sabal [place], [place] 09/03/19 9:06 AM

## 2019-09-04 ENCOUNTER — Other Ambulatory Visit (HOSPITAL_COMMUNITY)
Admission: RE | Admit: 2019-09-04 | Discharge: 2019-09-04 | Disposition: A | Discharge status: Home / Self Care | Payer: 59 | Source: Ambulatory Visit | Attending: Obstetrics and Gynecology | Admitting: Obstetrics and Gynecology

## 2019-09-04 ENCOUNTER — Other Ambulatory Visit: Payer: Self-pay | Admitting: Obstetrics and Gynecology

## 2019-09-04 DIAGNOSIS — Z01419 Encounter for gynecological examination (general) (routine) without abnormal findings: Secondary | ICD-10-CM | POA: Diagnosis not present

## 2019-09-06 LAB — CYTOLOGY - PAP
Comment: NEGATIVE
Diagnosis: NEGATIVE
High risk HPV: NEGATIVE

## 2019-10-29 ENCOUNTER — Telehealth: Payer: Self-pay | Admitting: Hematology and Oncology

## 2019-10-29 NOTE — Telephone Encounter (Signed)
Rescheduled per MD request. Called and spoke with patient. Confirmed new appt date 1/8

## 2019-11-07 ENCOUNTER — Inpatient Hospital Stay: Payer: 59 | Admitting: Hematology and Oncology

## 2019-11-07 NOTE — Progress Notes (Signed)
Patient Care Team: Cari Caraway, MD as PCP - General (Family Medicine) Christophe Louis, MD as Consulting Physician (Obstetrics and Gynecology) Alphonsa Overall, MD as Consulting Physician (General Surgery) Nicholas Lose, MD as Consulting Physician (Hematology and Oncology) Eppie Gibson, MD as Attending Physician (Radiation Oncology) Gardenia Phlegm, NP as Nurse Practitioner (Hematology and Oncology)  DIAGNOSIS:    ICD-10-CM   1. 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  The Common Hereditary Cancers Panel was ordered.  The Hereditary Gene Panel offered by [facility] includes sequencing and/or deletion duplication testing of the following 46 genes: APC, ATM, AXIN2, BARD1, BMPR1A, BRCA1, BRCA2, BRIP1, CDH1, CDKN2A (p14ARF), CDKN2A (p16INK4a), CHEK2, CTNNA1, DICER1, EPCAM (Deletion/duplication testing only), GREM1 (promoter region deletion/duplication testing only), KIT, MEN1, MLH1, MSH2, MSH3, MSH6, MUTYH, NBN, NF1, NHTL1, PALB2, PDGFRA, PMS2, POLD1, POLE, PTEN, RAD50, RAD51C, RAD51D, SDHB, SDHC, SDHD, SMAD4, SMARCA4. STK11, TP53, TSC1, TSC2, and VHL.  The following genes were evaluated for  sequence changes only: SDHA and HOXB13 c.251G>A variant only.       Results: POSITIVE for a mutation in BRCA2 c.[id number] (p.Asp1540Glyfs*6).  The date of this test report is 08/18/2017.   12/15/2017 Breast MRI   Radiologic complete response   01/22/2018 Surgery   Bilateral mastectomies: Right mastectomy: No residual cancer, 0/5 lymph nodes negative; left mastectomy: UDH, FA, no malignancy, ER 0%, PR 0%, HER-2 negative, Ki-67 90%, complete pathologic response   03/07/2018 - 04/18/2018 Radiation Therapy   1) Right Chest Wall / 50 Gy in 25 fractions 2) Right Supraclavicular fossa/ 50 Gy in 25 fractions 3) Right Chest Wall Scar boost / 10 Gy in 5 fractions      07/18/2018 Surgery   Bil Salpingo-Oophorectomy     CHIEF COMPLIANT: Surveillance of right breast cancer  INTERVAL HISTORY: Kara Crawford is a 54 y.o. with above-mentioned history of BRCA2 mutation positive, triple negative breast cancer who underwent neoadjuvant chemotherapy with complete response, bilateral mastectomies, radiation, and is currently on surveillance. She presents to the clinic today for follow-up.   ALLERGIES:  has No Known Allergies.  MEDICATIONS:  Current Outpatient Medications  Medication Sig Dispense Refill  . acetaminophen (TYLENOL) 500 MG tablet Take 1,000 mg by mouth daily as needed for moderate pain or headache.    Marland Kitchen amLODipine (NORVASC) 10 MG tablet Take 10 mg by mouth every morning.     Marland Kitchen atorvastatin (LIPITOR) 80 MG tablet Take 80 mg by mouth every morning.     . Calcium Citrate-Vitamin D (CALCIUM + D PO) Take 2 tablets by mouth daily.    . diphenhydramine-acetaminophen (TYLENOL PM) 25-500 MG TABS tablet Take 1-2 tablets by mouth at bedtime as needed (sleep).    Marland Kitchen lisinopril-hydrochlorothiazide (PRINZIDE,ZESTORETIC) 20-12.5  MG per tablet Take 2 tablets by mouth daily.     Marland Kitchen LORazepam (ATIVAN) 0.5 MG tablet Take 1 tablet (0.5 mg total) by mouth every 8 (eight) hours. 30 tablet 0  . metoprolol  succinate (TOPROL-XL) 50 MG 24 hr tablet Take 50 mg by mouth daily. Take with or immediately following a meal.    . pantoprazole (PROTONIX) 40 MG tablet Take 40 mg by mouth daily.    Vladimir Faster Glycol-Propyl Glycol (SYSTANE OP) Place 1 drop into both eyes daily as needed (dry eyes).    . vitamin B-12 (CYANOCOBALAMIN) 1000 MCG tablet Take 1,000 mcg by mouth daily.      No current facility-administered medications for this visit.   Facility-Administered Medications Ordered in Other Visits  Medication Dose Route Frequency Provider Last Rate Last Admin  . sodium chloride flush (NS) 0.9 % injection 10 mL  10 mL Intravenous PRN Nicholas Lose, MD   10 mL at 08/03/17 1516  . sodium chloride flush (NS) 0.9 % injection 10 mL  10 mL Intravenous PRN Nicholas Lose, MD   10 mL at 08/17/17 1531  . sodium chloride flush (NS) 0.9 % injection 10 mL  10 mL Intravenous PRN Nicholas Lose, MD   10 mL at 10/26/17 1014    PHYSICAL EXAMINATION: ECOG PERFORMANCE STATUS: 1 - Symptomatic but completely ambulatory  Vitals:   11/08/19 0835  BP: 100/62  Pulse: 76  Resp: 20  Temp: 98.2 F (36.8 C)  SpO2: 98%   Filed Weights   11/08/19 0835  Weight: 182 lb (82.6 kg)    BREAST: No palpable masses or nodules in either right or left breasts. No palpable axillary supraclavicular or infraclavicular adenopathy no breast tenderness or nipple discharge. (exam performed in the presence of a chaperone)  LABORATORY DATA:  I have reviewed the data as listed CMP Latest Ref Rng & Units 07/06/2018 01/18/2018 12/14/2017  Glucose 70 - 99 mg/dL 114(H) 104(H) 109  BUN 6 - 20 mg/dL _0 Creatinine 0.44 - 1.00 mg/dL 0.74 0.81 0.90  Sodium 135 - 145 mmol/L 140 139 142  Potassium 3.5 - 5.1 mmol/L 3.7 3.7 4.0  Chloride 98 - 111 mmol/L 103 104 105  CO2 22 - 32 mmol/L _1 Calcium 8.9 - 10.3 mg/dL 9.7 9.6 10.1  Total Protein 6.5 - 8.1 g/dL 7.7 - 7.2  Total Bilirubin 0.3 - 1.2 mg/dL 0.7 - 0.3  Alkaline Phos 38 - 126 U/L 50  - 49  AST 15 - 41 U/L 21 - 25  ALT 0 - 44 U/L 25 - 39    Lab Results  Component Value Date   WBC 2.6 (L) 07/06/2018   HGB 11.8 (L) 07/06/2018   HCT 36.5 07/06/2018   MCV 95.8 07/06/2018   PLT 239 07/06/2018   NEUTROABS 1.4 (L) 12/14/2017    ASSESSMENT & PLAN:  Malignant neoplasm of upper-outer quadrant of right breast in female, estrogen receptor negative ([place]) 07/17/2017:Right upper outer quadrant painful palpable right breast mass by ultrasound measured 1 cm with 3 abnormal axillary lymph nodes 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  Chest wall examination:  No palpable lumps or nodules of concern 2.    No imaging studies required since she had bilateral mastectomies. Patient underwent bilateral salpingo-oophorectomy 07/18/2018.  Survivorship: I discussed with her about exercise and weight loss.  Previously I discussed with her about intermittent fasting.  Previously we also discussed about ABC clinical trial. Return to clinic in 1 year for follow-up    No orders of the defined types were placed in this encounter.  The patient has a good understanding of the overall plan. she agrees with it. she will call with any problems that may develop before the next visit here.  Total time spent: 15 mins including face to face time and time spent for planning, charting and coordination of care  Nicholas Lose,  MD 11/08/2019  I, Cloyde Reams Dorshimer, am acting as scribe for Dr. Nicholas Lose.  I have reviewed the above documentation for accuracy and completeness, and I agree with the above.

## 2019-11-08 ENCOUNTER — Inpatient Hospital Stay: Payer: 59 | Attending: Hematology and Oncology | Admitting: Hematology and Oncology

## 2019-11-08 ENCOUNTER — Other Ambulatory Visit: Payer: Self-pay

## 2019-11-08 ENCOUNTER — Telehealth: Payer: Self-pay | Admitting: Hematology and Oncology

## 2019-11-08 DIAGNOSIS — C50411 Malignant neoplasm of upper-outer quadrant of right female breast: Secondary | ICD-10-CM | POA: Insufficient documentation

## 2019-11-08 DIAGNOSIS — Z171 Estrogen receptor negative status [ER-]: Secondary | ICD-10-CM

## 2019-11-08 DIAGNOSIS — Z79899 Other long term (current) drug therapy: Secondary | ICD-10-CM | POA: Insufficient documentation

## 2019-11-08 DIAGNOSIS — Z9221 Personal history of antineoplastic chemotherapy: Secondary | ICD-10-CM | POA: Insufficient documentation

## 2019-11-08 DIAGNOSIS — Z923 Personal history of irradiation: Secondary | ICD-10-CM | POA: Diagnosis not present

## 2019-11-08 NOTE — Telephone Encounter (Signed)
I talk with patient regarding schedule  

## 2019-11-08 NOTE — Assessment & Plan Note (Signed)
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  Chest wall examination: No palpable lumps or nodules of concern 2.    No imaging studies required since she had bilateral mastectomies. Patient underwent bilateral salpingo-oophorectomy 07/18/2018.  Survivorship: I discussed with her about exercise and weight loss.  Previously I discussed with her about intermittent fasting.  Previously we also discussed about ABC clinical trial. Return to clinic in 1 year for follow-up

## 2020-11-08 NOTE — Progress Notes (Signed)
Patient Care Team: Cari Caraway, MD as PCP - General (Family Medicine) Christophe Louis, MD as Consulting Physician (Obstetrics and Gynecology) Alphonsa Overall, MD as Consulting Physician (General Surgery) Nicholas Lose, MD as Consulting Physician (Hematology and Oncology) Eppie Gibson, MD as Attending Physician (Radiation Oncology) Gardenia Phlegm, NP as Nurse Practitioner (Hematology and Oncology)  DIAGNOSIS:    ICD-10-CM   1. 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  The Common Hereditary Cancers Panel was ordered.  The Hereditary Gene Panel offered by [facility] includes sequencing and/or deletion duplication testing of the following 46 genes: APC, ATM, AXIN2, BARD1, BMPR1A, BRCA1, BRCA2, BRIP1, CDH1, CDKN2A (p14ARF), CDKN2A (p16INK4a), CHEK2, CTNNA1, DICER1, EPCAM (Deletion/duplication testing only), GREM1 (promoter region deletion/duplication testing only), KIT, MEN1, MLH1, MSH2, MSH3, MSH6, MUTYH, NBN, NF1, NHTL1, PALB2, PDGFRA, PMS2, POLD1, POLE, PTEN, RAD50, RAD51C, RAD51D, SDHB, SDHC, SDHD, SMAD4, SMARCA4. STK11, TP53, TSC1, TSC2, and VHL.  The following genes were evaluated for  sequence changes only: SDHA and HOXB13 c.251G>A variant only.       Results: POSITIVE for a mutation in BRCA2 c.4619_4623delACAAA (p.Asp1540Glyfs*6).  The date of this test report is 08/18/2017.   12/15/2017 Breast MRI   Radiologic complete response   01/22/2018 Surgery   Bilateral mastectomies: Right mastectomy: No residual cancer, 0/5 lymph nodes negative; left mastectomy: UDH, FA, no malignancy, ER 0%, PR 0%, HER-2 negative, Ki-67 90%, complete pathologic response   03/07/2018 - 04/18/2018 Radiation Therapy   1) Right Chest Wall / 50 Gy in 25 fractions 2) Right Supraclavicular fossa/ 50 Gy in 25 fractions 3) Right Chest Wall Scar boost / 10 Gy in 5 fractions      07/18/2018 Surgery   Bil Salpingo-Oophorectomy     CHIEF COMPLIANT: Surveillance of right breast cancer  INTERVAL HISTORY: Kara Crawford is a 55 y.o. with above-mentioned history of BRCA2 mutation positive, triple negative breast cancer who underwent neoadjuvant chemotherapy with complete response, bilateral mastectomies, radiation, and is currently on surveillance.She presents to the clinic today for follow-up.    ALLERGIES:  has No Known Allergies.  MEDICATIONS:  Current Outpatient Medications  Medication Sig Dispense Refill   acetaminophen (TYLENOL) 500 MG tablet Take 1,000 mg by mouth daily as needed for moderate pain or headache.     amLODipine (NORVASC) 10 MG tablet Take 10 mg by mouth every morning.      atorvastatin (LIPITOR) 80 MG tablet Take 80 mg by mouth every morning.      Calcium Citrate-Vitamin D (CALCIUM + D PO) Take 2 tablets by mouth daily.     diphenhydramine-acetaminophen (TYLENOL PM) 25-500 MG TABS tablet Take 1-2 tablets by mouth at bedtime as needed (sleep).     lisinopril-hydrochlorothiazide (PRINZIDE,ZESTORETIC) 20-12.5  MG per tablet Take 2 tablets by mouth daily.      LORazepam (ATIVAN) 0.5 MG tablet Take 1 tablet (0.5 mg total) by mouth every 8 (eight) hours. 30 tablet 0   metoprolol  succinate (TOPROL-XL) 50 MG 24 hr tablet Take 50 mg by mouth daily. Take with or immediately following a meal.     pantoprazole (PROTONIX) 40 MG tablet Take 40 mg by mouth daily.     Polyethyl Glycol-Propyl Glycol (SYSTANE OP) Place 1 drop into both eyes daily as needed (dry eyes).     vitamin B-12 (CYANOCOBALAMIN) 1000 MCG tablet Take 1,000 mcg by mouth daily.      No current facility-administered medications for this visit.   Facility-Administered Medications Ordered in Other Visits  Medication Dose Route Frequency Provider Last Rate Last Admin   sodium chloride flush (NS) 0.9 % injection 10 mL  10 mL Intravenous PRN Nicholas Lose, MD   10 mL at 08/03/17 1516   sodium chloride flush (NS) 0.9 % injection 10 mL  10 mL Intravenous PRN Nicholas Lose, MD   10 mL at 08/17/17 1531   sodium chloride flush (NS) 0.9 % injection 10 mL  10 mL Intravenous PRN Nicholas Lose, MD   10 mL at 10/26/17 1014    PHYSICAL EXAMINATION: ECOG PERFORMANCE STATUS: 1 - Symptomatic but completely ambulatory  Vitals:   11/09/20 0848  BP: (!) 146/75  Pulse: 70  Resp: 18  Temp: (!) 97.2 F (36.2 C)  SpO2: 99%   Filed Weights   11/09/20 0848  Weight: 188 lb 3.2 oz (85.4 kg)    BREAST: No palpable masses or nodules in either right or left chest wall or axilla. (exam performed in the presence of a chaperone)  LABORATORY DATA:  I have reviewed the data as listed CMP Latest Ref Rng & Units 07/06/2018 01/18/2018 12/14/2017  Glucose 70 - 99 mg/dL 114(H) 104(H) 109  BUN 6 - 20 mg/dL $Remove'14 15 11  'DprgRCm$ Creatinine 0.44 - 1.00 mg/dL 0.74 0.81 0.90  Sodium 135 - 145 mmol/L 140 139 142  Potassium 3.5 - 5.1 mmol/L 3.7 3.7 4.0  Chloride 98 - 111 mmol/L 103 104 105  CO2 22 - 32 mmol/L $RemoveB'28 26 26  'iWGZtCkA$ Calcium 8.9 - 10.3 mg/dL 9.7 9.6 10.1  Total Protein 6.5 - 8.1 g/dL 7.7 - 7.2  Total Bilirubin 0.3 - 1.2 mg/dL 0.7 - 0.3  Alkaline Phos 38 - 126 U/L 50 - 49  AST 15 - 41 U/L 21 - 25  ALT 0 - 44 U/L 25 - 39    Lab Results   Component Value Date   WBC 2.6 (L) 07/06/2018   HGB 11.8 (L) 07/06/2018   HCT 36.5 07/06/2018   MCV 95.8 07/06/2018   PLT 239 07/06/2018   NEUTROABS 1.4 (L) 12/14/2017    ASSESSMENT & PLAN:  Malignant neoplasm of upper-outer quadrant of right breast in female, estrogen receptor negative ([place]) 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Chest  wall examination: No palpable lumps or nodules of concern 2.  No imaging studies required since she had bilateral mastectomies. Patientunderwent bilateral salpingo-oophorectomy9/18/2019.  Intermittent anxiety: I renewed her prescription for lorazepam.  Return to clinic in 1 year for follow-up   No orders of the defined types were placed in this encounter.  The patient has a good understanding of the overall plan. she agrees with it. she will call with any problems that may develop before the next visit here.  Total time spent: 20 mins including face to face time and time spent for planning, charting and coordination of care  Nicholas Lose, MD 11/09/2020  I, Cloyde Reams Dorshimer, am acting as scribe for Dr. Nicholas Lose.  I have reviewed the above  documentation for accuracy and completeness, and I agree with the above.

## 2020-11-09 ENCOUNTER — Other Ambulatory Visit: Payer: Self-pay

## 2020-11-09 ENCOUNTER — Inpatient Hospital Stay: Payer: 59 | Attending: Hematology and Oncology | Admitting: Hematology and Oncology

## 2020-11-09 DIAGNOSIS — Z853 Personal history of malignant neoplasm of breast: Secondary | ICD-10-CM | POA: Diagnosis not present

## 2020-11-09 DIAGNOSIS — F419 Anxiety disorder, unspecified: Secondary | ICD-10-CM | POA: Insufficient documentation

## 2020-11-09 DIAGNOSIS — Z9013 Acquired absence of bilateral breasts and nipples: Secondary | ICD-10-CM | POA: Insufficient documentation

## 2020-11-09 DIAGNOSIS — C50411 Malignant neoplasm of upper-outer quadrant of right female breast: Secondary | ICD-10-CM

## 2020-11-09 DIAGNOSIS — Z923 Personal history of irradiation: Secondary | ICD-10-CM | POA: Insufficient documentation

## 2020-11-09 DIAGNOSIS — Z9221 Personal history of antineoplastic chemotherapy: Secondary | ICD-10-CM | POA: Insufficient documentation

## 2020-11-09 DIAGNOSIS — Z171 Estrogen receptor negative status [ER-]: Secondary | ICD-10-CM | POA: Diagnosis not present

## 2020-11-09 DIAGNOSIS — Z1501 Genetic susceptibility to malignant neoplasm of breast: Secondary | ICD-10-CM | POA: Insufficient documentation

## 2020-11-09 MED ORDER — LORAZEPAM 0.5 MG PO TABS: 0.5000 mg | ORAL_TABLET | Freq: Three times a day (TID) | ORAL | 0 refills | Status: DC

## 2020-11-09 NOTE — Assessment & Plan Note (Signed)
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Chest wall examination: No palpable lumps or nodules of concern 2.  No imaging studies required since she had bilateral mastectomies. Patientunderwent bilateral salpingo-oophorectomy9/18/2019.  Return to clinic in 1 year for follow-up

## 2021-11-08 NOTE — Assessment & Plan Note (Signed)
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Chest wall examination: No palpable lumps or nodules of concern 2.No imaging studies required since she had bilateral mastectomies. Patientunderwent bilateral salpingo-oophorectomy9/18/2019.  Intermittent anxiety: I renewed her prescription for lorazepam.  Return to clinic in 1 year for follow-up

## 2021-11-09 ENCOUNTER — Inpatient Hospital Stay: Payer: 59 | Attending: Hematology and Oncology | Admitting: Hematology and Oncology

## 2021-11-09 ENCOUNTER — Other Ambulatory Visit: Payer: Self-pay

## 2021-11-09 DIAGNOSIS — Z9221 Personal history of antineoplastic chemotherapy: Secondary | ICD-10-CM | POA: Insufficient documentation

## 2021-11-09 DIAGNOSIS — C50411 Malignant neoplasm of upper-outer quadrant of right female breast: Secondary | ICD-10-CM | POA: Diagnosis not present

## 2021-11-09 DIAGNOSIS — Z923 Personal history of irradiation: Secondary | ICD-10-CM | POA: Insufficient documentation

## 2021-11-09 DIAGNOSIS — Z9013 Acquired absence of bilateral breasts and nipples: Secondary | ICD-10-CM | POA: Insufficient documentation

## 2021-11-09 DIAGNOSIS — Z171 Estrogen receptor negative status [ER-]: Secondary | ICD-10-CM | POA: Diagnosis not present

## 2021-11-09 DIAGNOSIS — Z853 Personal history of malignant neoplasm of breast: Secondary | ICD-10-CM | POA: Diagnosis present

## 2021-11-09 DIAGNOSIS — Z90722 Acquired absence of ovaries, bilateral: Secondary | ICD-10-CM | POA: Diagnosis not present

## 2021-11-09 DIAGNOSIS — Z79899 Other long term (current) drug therapy: Secondary | ICD-10-CM | POA: Insufficient documentation

## 2021-11-09 NOTE — Progress Notes (Signed)
Patient Care Team: Cari Caraway, MD as PCP - General (Family Medicine) Christophe Louis, MD as Consulting Physician (Obstetrics and Gynecology) Alphonsa Overall, MD as Consulting Physician (General Surgery) Nicholas Lose, MD as Consulting Physician (Hematology and Oncology) Eppie Gibson, MD as Attending Physician (Radiation Oncology) Gardenia Phlegm, NP as Nurse Practitioner (Hematology and Oncology)  DIAGNOSIS:  Encounter Diagnosis  Name Primary?   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  The Common Hereditary Cancers Panel was ordered.  The Hereditary Gene Panel offered by [facility] includes sequencing and/or deletion duplication testing of the following 46 genes: APC, ATM, AXIN2, BARD1, BMPR1A, BRCA1, BRCA2, BRIP1, CDH1, CDKN2A (p14ARF), CDKN2A (p16INK4a), CHEK2, CTNNA1, DICER1, EPCAM (Deletion/duplication testing only), GREM1 (promoter region deletion/duplication testing only), KIT, MEN1, MLH1, MSH2, MSH3, MSH6, MUTYH, NBN, NF1, NHTL1, PALB2, PDGFRA, PMS2, POLD1, POLE, PTEN, RAD50, RAD51C, RAD51D, SDHB, SDHC, SDHD, SMAD4, SMARCA4. STK11, TP53, TSC1, TSC2, and VHL.  The following genes were evaluated  for sequence changes only: SDHA and HOXB13 c.251G>A variant only.        Results: POSITIVE for a mutation in BRCA2 c.[id number] (p.Asp1540Glyfs*6).  The date of this test report is 08/18/2017.   12/15/2017 Breast MRI   Radiologic complete response   01/22/2018 Surgery   Bilateral mastectomies: Right mastectomy: No residual cancer, 0/5 lymph nodes negative; left mastectomy: UDH, FA, no malignancy, ER 0%, PR 0%, HER-2 negative, Ki-67 90%, complete pathologic response   03/07/2018 - 04/18/2018 Radiation Therapy   1) Right Chest Wall / 50 Gy in 25 fractions 2) Right Supraclavicular fossa/ 50 Gy in 25 fractions 3) Right Chest Wall Scar boost / 10 Gy in 5 fractions       07/18/2018 Surgery   Bil Salpingo-Oophorectomy     CHIEF COMPLIANT: Follow-up of history of right breast cancer  INTERVAL HISTORY: Kara Crawford is a 56 year old with above-mentioned history of right breast cancer who underwent neoadjuvant chemotherapy followed by bilateral mastectomies and radiation followed by salpingo-oophorectomy.  She is here for routine follow-up.  She reports no new problems or concerns.  She is occasionally sore in the muscles of the chest wall but is without any lumps or nodules.  She has been exercising and staying active.   ALLERGIES:  has No Known Allergies.  MEDICATIONS:  Current Outpatient Medications  Medication Sig Dispense Refill   acetaminophen (TYLENOL) 500 MG tablet Take 1,000 mg by mouth daily as needed for moderate pain or headache.     amLODipine (NORVASC) 10 MG tablet Take 10 mg by mouth every morning.      atorvastatin (LIPITOR) 80 MG tablet Take 80 mg by mouth every morning.      Calcium Citrate-Vitamin D (CALCIUM + D PO) Take 2 tablets by mouth daily.  diphenhydramine-acetaminophen (TYLENOL PM) 25-500 MG TABS tablet Take 1-2 tablets by mouth at bedtime as needed (sleep).     lisinopril-hydrochlorothiazide (PRINZIDE,ZESTORETIC) 20-12.5 MG per tablet Take 2 tablets by  mouth daily.      LORazepam (ATIVAN) 0.5 MG tablet Take 1 tablet (0.5 mg total) by mouth every 8 (eight) hours. 30 tablet 0   metoprolol succinate (TOPROL-XL) 50 MG 24 hr tablet Take 50 mg by mouth daily. Take with or immediately following a meal.     pantoprazole (PROTONIX) 40 MG tablet Take 40 mg by mouth daily.     Polyethyl Glycol-Propyl Glycol (SYSTANE OP) Place 1 drop into both eyes daily as needed (dry eyes).     vitamin B-12 (CYANOCOBALAMIN) 1000 MCG tablet Take 1,000 mcg by mouth daily.      No current facility-administered medications for this visit.   Facility-Administered Medications Ordered in Other Visits  Medication Dose Route Frequency Provider Last Rate Last Admin   sodium chloride flush (NS) 0.9 % injection 10 mL  10 mL Intravenous PRN Nicholas Lose, MD   10 mL at 08/03/17 1516   sodium chloride flush (NS) 0.9 % injection 10 mL  10 mL Intravenous PRN Nicholas Lose, MD   10 mL at 08/17/17 1531   sodium chloride flush (NS) 0.9 % injection 10 mL  10 mL Intravenous PRN Nicholas Lose, MD   10 mL at 10/26/17 1014    PHYSICAL EXAMINATION: ECOG PERFORMANCE STATUS: 1 - Symptomatic but completely ambulatory  Vitals:   11/09/21 0851  BP: 140/84  Pulse: 87  Resp: 18  Temp: (!) 97.5 F (36.4 C)  SpO2: 98%   Filed Weights   11/09/21 0851  Weight: 190 lb 9.6 oz (86.5 kg)    BREAST: No lumps or nodules, bilateral mastectomies. (exam performed in the presence of a chaperone)  LABORATORY DATA:  I have reviewed the data as listed CMP Latest Ref Rng & Units 07/06/2018 01/18/2018 12/14/2017  Glucose 70 - 99 mg/dL 114(H) 104(H) 109  BUN 6 - 20 mg/dL _0 Creatinine 0.44 - 1.00 mg/dL 0.74 0.81 0.90  Sodium 135 - 145 mmol/L 140 139 142  Potassium 3.5 - 5.1 mmol/L 3.7 3.7 4.0  Chloride 98 - 111 mmol/L 103 104 105  CO2 22 - 32 mmol/L _1 Calcium 8.9 - 10.3 mg/dL 9.7 9.6 10.1  Total Protein 6.5 - 8.1 g/dL 7.7 - 7.2  Total Bilirubin 0.3 - 1.2 mg/dL 0.7 - 0.3  Alkaline  Phos 38 - 126 U/L 50 - 49  AST 15 - 41 U/L 21 - 25  ALT 0 - 44 U/L 25 - 39    Lab Results  Component Value Date   WBC 2.6 (L) 07/06/2018   HGB 11.8 (L) 07/06/2018   HCT 36.5 07/06/2018   MCV 95.8 07/06/2018   PLT 239 07/06/2018   NEUTROABS 1.4 (L) 12/14/2017    ASSESSMENT & PLAN:  Malignant neoplasm of upper-outer quadrant of right breast in female, estrogen receptor negative ([place]) 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  Chest wall examination: No palpable lumps or nodules of concern 2.    No imaging studies required since she had bilateral mastectomies. Patient underwent bilateral salpingo-oophorectomy 07/18/2018.   Intermittent anxiety: I renewed her prescription for lorazepam. Encouraged her to exercise regularly.Return to clinic in 1 year for follow-up     No orders of the defined types were placed in this encounter.  The patient has a good understanding of the overall plan. she agrees with it. she will call with any problems that may develop before the next visit here. Total time spent: 30 mins including face to face time and time spent for planning, charting and co-ordination of  care   Harriette Ohara, MD 11/09/21

## 2022-05-20 ENCOUNTER — Other Ambulatory Visit: Payer: Self-pay | Admitting: General Surgery

## 2022-05-20 DIAGNOSIS — R1012 Left upper quadrant pain: Secondary | ICD-10-CM

## 2022-06-09 ENCOUNTER — Ambulatory Visit
Admission: RE | Admit: 2022-06-09 | Discharge: 2022-06-09 | Disposition: A | Discharge status: Home / Self Care | Payer: 59 | Source: Ambulatory Visit | Attending: General Surgery | Admitting: General Surgery

## 2022-06-09 DIAGNOSIS — R1012 Left upper quadrant pain: Secondary | ICD-10-CM

## 2022-06-09 MED ORDER — IOPAMIDOL (ISOVUE-300) INJECTION 61%
100.0000 mL | Freq: Once | INTRAVENOUS | Status: AC | PRN
  Administered 2022-06-09: 100 mL via INTRAVENOUS

## 2022-06-22 ENCOUNTER — Other Ambulatory Visit: Payer: Self-pay | Admitting: General Surgery

## 2022-06-22 DIAGNOSIS — K828 Other specified diseases of gallbladder: Secondary | ICD-10-CM

## 2022-06-24 ENCOUNTER — Ambulatory Visit
Admission: RE | Admit: 2022-06-24 | Discharge: 2022-06-24 | Disposition: A | Discharge status: Home / Self Care | Payer: 59 | Source: Ambulatory Visit | Attending: General Surgery | Admitting: General Surgery

## 2022-06-24 DIAGNOSIS — K828 Other specified diseases of gallbladder: Secondary | ICD-10-CM

## 2022-09-28 ENCOUNTER — Other Ambulatory Visit: Payer: Self-pay | Admitting: Obstetrics and Gynecology

## 2022-09-28 ENCOUNTER — Other Ambulatory Visit (HOSPITAL_COMMUNITY)
Admission: RE | Admit: 2022-09-28 | Discharge: 2022-09-28 | Disposition: A | Discharge status: Home / Self Care | Payer: 59 | Source: Ambulatory Visit | Attending: Obstetrics and Gynecology | Admitting: Obstetrics and Gynecology

## 2022-09-28 DIAGNOSIS — Z01419 Encounter for gynecological examination (general) (routine) without abnormal findings: Secondary | ICD-10-CM | POA: Insufficient documentation

## 2022-10-03 LAB — CYTOLOGY - PAP
Comment: NEGATIVE
Diagnosis: NEGATIVE
High risk HPV: NEGATIVE

## 2022-11-01 NOTE — Progress Notes (Signed)
Patient Care Team: Cari Caraway, MD as PCP - General (Family Medicine) Christophe Louis, MD as Consulting Physician (Obstetrics and Gynecology) Alphonsa Overall, MD as Consulting Physician (General Surgery) Nicholas Lose, MD as Consulting Physician (Hematology and Oncology) Eppie Gibson, MD as Attending Physician (Radiation Oncology) Gardenia Phlegm, NP as Nurse Practitioner (Hematology and Oncology)  DIAGNOSIS:  Encounter Diagnosis  Name Primary?   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  The Common Hereditary Cancers Panel was ordered.  The Hereditary Gene Panel offered by [facility] includes sequencing and/or deletion duplication testing of the following 46 genes: APC, ATM, AXIN2, BARD1, BMPR1A, BRCA1, BRCA2, BRIP1, CDH1, CDKN2A (p14ARF), CDKN2A (p16INK4a), CHEK2, CTNNA1, DICER1, EPCAM (Deletion/duplication testing only), GREM1 (promoter region deletion/duplication testing only), KIT, MEN1, MLH1, MSH2, MSH3, MSH6, MUTYH, NBN, NF1, NHTL1, PALB2, PDGFRA, PMS2, POLD1, POLE, PTEN, RAD50, RAD51C, RAD51D, SDHB, SDHC, SDHD, SMAD4, SMARCA4. STK11, TP53, TSC1, TSC2, and VHL.  The following genes were  evaluated for sequence changes only: SDHA and HOXB13 c.251G>A variant only.        Results: POSITIVE for a mutation in BRCA2 c.[id number] (p.Asp1540Glyfs*6).  The date of this test report is 08/18/2017.   12/15/2017 Breast MRI   Radiologic complete response   01/22/2018 Surgery   Bilateral mastectomies: Right mastectomy: No residual cancer, 0/5 lymph nodes negative; left mastectomy: UDH, FA, no malignancy, ER 0%, PR 0%, HER-2 negative, Ki-67 90%, complete pathologic response   03/07/2018 - 04/18/2018 Radiation Therapy   1) Right Chest Wall / 50 Gy in 25 fractions 2) Right Supraclavicular fossa/ 50 Gy in 25 fractions 3) Right Chest Wall Scar boost / 10 Gy in 5 fractions       07/18/2018 Surgery   Bil Salpingo-Oophorectomy     CHIEF COMPLIANT: Follow-up of history of right breast cancer   INTERVAL HISTORY: Kara Crawford is a 57 year old with above-mentioned history of right breast cancer who underwent neoadjuvant chemotherapy followed by bilateral mastectomies and radiation followed by salpingo-oophorectomy. She presents to the clinic for a follow-up.  Right arm lymphedema.  Tightness in the right chest wall    ALLERGIES:  has No Known Allergies.  MEDICATIONS:  Current Outpatient Medications  Medication Sig Dispense Refill   acetaminophen (TYLENOL) 500 MG tablet Take 1,000 mg by mouth daily as needed for moderate pain or headache.     amLODipine (NORVASC) 10 MG tablet Take 10 mg by mouth every morning.      atorvastatin (LIPITOR) 80 MG tablet Take 80 mg by mouth every morning.      Calcium Citrate-Vitamin D (CALCIUM + D PO) Take 2 tablets by mouth daily.     diphenhydramine-acetaminophen (TYLENOL PM) 25-500 MG TABS tablet Take 1-2 tablets by mouth at bedtime as needed (sleep).  lisinopril-hydrochlorothiazide (PRINZIDE,ZESTORETIC) 20-12.5 MG per tablet Take 2 tablets by mouth daily.      LORazepam (ATIVAN) 0.5 MG tablet Take 1 tablet (0.5 mg total) by mouth every 8 (eight)  hours. 30 tablet 0   metoprolol succinate (TOPROL-XL) 50 MG 24 hr tablet Take 50 mg by mouth daily. Take with or immediately following a meal.     pantoprazole (PROTONIX) 40 MG tablet Take 40 mg by mouth daily.     Polyethyl Glycol-Propyl Glycol (SYSTANE OP) Place 1 drop into both eyes daily as needed (dry eyes).     vitamin B-12 (CYANOCOBALAMIN) 1000 MCG tablet Take 1,000 mcg by mouth daily.      No current facility-administered medications for this visit.   Facility-Administered Medications Ordered in Other Visits  Medication Dose Route Frequency Provider Last Rate Last Admin   sodium chloride flush (NS) 0.9 % injection 10 mL  10 mL Intravenous PRN Nicholas Lose, MD   10 mL at 08/03/17 1516   sodium chloride flush (NS) 0.9 % injection 10 mL  10 mL Intravenous PRN Nicholas Lose, MD   10 mL at 08/17/17 1531   sodium chloride flush (NS) 0.9 % injection 10 mL  10 mL Intravenous PRN Nicholas Lose, MD   10 mL at 10/26/17 1014    PHYSICAL EXAMINATION: ECOG PERFORMANCE STATUS: 1 - Symptomatic but completely ambulatory  There were no vitals filed for this visit. There were no vitals filed for this visit.  BREAST: No palpable masses or nodules in either right or left breasts. No palpable axillary supraclavicular or infraclavicular adenopathy no breast tenderness or nipple discharge. (exam performed in the presence of a chaperone)  LABORATORY DATA:  I have reviewed the data as listed    Latest Ref Rng & Units 07/06/2018    9:31 AM 01/18/2018    8:26 AM 12/14/2017    1:29 PM  CMP  Glucose 70 - 99 mg/dL 114  104  109   BUN 6 - 20 mg/dL _0 Creatinine 0.44 - 1.00 mg/dL 0.74  0.81  0.90   Sodium 135 - 145 mmol/L 140  139  142   Potassium 3.5 - 5.1 mmol/L 3.7  3.7  4.0   Chloride 98 - 111 mmol/L 103  104  105   CO2 22 - 32 mmol/L _1 Calcium 8.9 - 10.3 mg/dL 9.7  9.6  10.1   Total Protein 6.5 - 8.1 g/dL 7.7   7.2   Total Bilirubin 0.3 - 1.2 mg/dL 0.7   0.3   Alkaline Phos 38  - 126 U/L 50   49   AST 15 - 41 U/L 21   25   ALT 0 - 44 U/L 25   39     Lab Results  Component Value Date   WBC 2.6 (L) 07/06/2018   HGB 11.8 (L) 07/06/2018   HCT 36.5 07/06/2018   MCV 95.8 07/06/2018   PLT 239 07/06/2018   NEUTROABS 1.4 (L) 12/14/2017    ASSESSMENT & PLAN:  Malignant neoplasm of upper-outer quadrant of right breast in female, estrogen receptor negative ([place]) 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  11/08/2022: Chest wall examination: No palpable lumps or nodules of concern 2.    No imaging studies required since she had bilateral mastectomies. Patient underwent bilateral salpingo-oophorectomy 07/18/2018.   Intermittent anxiety: On Lorazepam Encouraged her to exercise regularly.  Right arm lymphedema: I will refer her to physical therapy. I discussed with her that she could be seen on an as-needed basis. She will call us if she has any concerns.    No orders of the defined types were placed in this encounter.  The patient has a good understanding of the overall plan. she agrees with it. she will call with any problems that may develop before the next visit here. Total  time spent: 30 mins including face to face time and time spent for planning, charting and co-ordination of care   Harriette Ohara, MD 11/09/22    I Gardiner Coins am acting as a Education administrator for [employer]  I have reviewed the above documentation for accuracy and completeness, and I agree with the above.

## 2022-11-08 NOTE — Assessment & Plan Note (Signed)
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  11/08/2022: Chest wall examination: No palpable lumps or nodules of concern 2.    No imaging studies required since she had bilateral mastectomies. Patient underwent bilateral salpingo-oophorectomy 07/18/2018.   Intermittent anxiety: I renewed her prescription for lorazepam. Encouraged her to exercise regularly.  Return to clinic in 1 year for follow-up

## 2022-11-09 ENCOUNTER — Other Ambulatory Visit: Payer: Self-pay

## 2022-11-09 ENCOUNTER — Inpatient Hospital Stay: Payer: 59 | Attending: Hematology and Oncology | Admitting: Hematology and Oncology

## 2022-11-09 VITALS — BP 155/92 | HR 81 | Temp 97.7°F | Resp 18 | Ht 60.0 in | Wt 191.2 lb

## 2022-11-09 DIAGNOSIS — Z79899 Other long term (current) drug therapy: Secondary | ICD-10-CM | POA: Insufficient documentation

## 2022-11-09 DIAGNOSIS — C50411 Malignant neoplasm of upper-outer quadrant of right female breast: Secondary | ICD-10-CM | POA: Diagnosis not present

## 2022-11-09 DIAGNOSIS — Z9013 Acquired absence of bilateral breasts and nipples: Secondary | ICD-10-CM | POA: Diagnosis not present

## 2022-11-09 DIAGNOSIS — Z853 Personal history of malignant neoplasm of breast: Secondary | ICD-10-CM | POA: Diagnosis present

## 2022-11-09 DIAGNOSIS — Z923 Personal history of irradiation: Secondary | ICD-10-CM | POA: Diagnosis not present

## 2022-11-09 DIAGNOSIS — Z90722 Acquired absence of ovaries, bilateral: Secondary | ICD-10-CM | POA: Diagnosis not present

## 2022-11-09 DIAGNOSIS — Z9221 Personal history of antineoplastic chemotherapy: Secondary | ICD-10-CM | POA: Insufficient documentation

## 2022-11-09 DIAGNOSIS — Z171 Estrogen receptor negative status [ER-]: Secondary | ICD-10-CM | POA: Diagnosis not present

## 2022-11-22 NOTE — Therapy (Signed)
OUTPATIENT PHYSICAL THERAPY ONCOLOGY EVALUATION  Patient Name: Kara Crawford MRN: [id number] DOB:03/22/1966, 57 y.o., female Today's Date: 11/23/2022  END OF SESSION:  PT End of Session - 11/23/22 0904     Visit Number 1    Number of Visits 18    Date for PT Re-Evaluation 01/04/23    PT Start Time 0905    PT Stop Time 0943    PT Time Calculation (min) 38 min    Activity Tolerance Patient tolerated treatment well    Behavior During Therapy [facility] for tasks assessed/performed             Past Medical History:  Diagnosis Date   Anemia    anemia prior to uterine emboliztion procedure.   Breast cancer ([place])    Diabetes mellitus without complication ([place])    type 2   Family history of breast cancer    Family history of ovarian cancer    Family history of prostate cancer    Fibroids    Headache    Heart murmur    History of radiation therapy 03/07/18- 04/18/18   Right Chest wall 50 Gy in 25 fractions, Right supraclavicular fossa 50 Gy in 25 fractions, Right Chest wall scar boost 10 Gy in 5 fractions.    Hyperlipidemia    Hypertension    Past Surgical History:  Procedure Laterality Date   BREAST SURGERY     double mastectomy   BREATH TEK H PYLORI N/A 04/17/2014   Procedure: BREATH TEK H PYLORI;  Surgeon: Shann Medal, MD;  Location: [facility];  Service: General;  Laterality: N/A;   CESAREAN SECTION     x3- normal development   CYSTOSCOPY N/A 07/18/2018   Procedure: CYSTOSCOPY;  Surgeon: Christophe Louis, MD;  Location: [facility];  Service: Gynecology;  Laterality: N/A;   ESOPHAGOGASTRODUODENOSCOPY N/A 08/20/2015   Procedure: ESOPHAGOGASTRODUODENOSCOPY (EGD);  Surgeon: Alphonsa Overall, MD;  Location: [facility];  Service: General;  Laterality: N/A;   EXPLORATORY LAPAROTOMY  05/13/2015   GASTRIC ROUX-EN-Y N/A 10/06/2014   Procedure: LAPAROSCOPIC ROUX-EN-Y GASTRIC BYPASS WITH UPPER ENDOSCOPY;  Surgeon: Pedro Earls, MD;  Location: [facility];  Service: General;  Laterality: N/A;    LAPAROSCOPIC SALPINGO OOPHERECTOMY Bilateral 07/18/2018   Procedure: LAPAROSCOPIC SALPINGO OOPHORECTOMY;  Surgeon: Christophe Louis, MD;  Location: [facility];  Service: Gynecology;  Laterality: Bilateral;  possible laparotomy with abdominal bilateral salpingoophorectomy   LAPAROSCOPY N/A 05/12/2015   Procedure: LAPAROSCOPY REPAIR OF PERFORATED BOWEL;  Surgeon: Alphonsa Overall, MD;  Location: [place];  Service: General;  Laterality: N/A;   MASTECTOMY WITH RADIOACTIVE SEED GUIDED EXCISION AND AXILLARY SENTINEL LYMPH NODE BIOPSY Bilateral 01/22/2018   Procedure: BILATERAL MASTECTOMIES WITH RADIOACTIVE SEED TARGETED RIGHT AXILLARY [place] NODE EXCISION AND RIGHT SENTINEL LYMPH NODE BIOPSY;  Surgeon: Alphonsa Overall, MD;  Location: [place];  Service: General;  Laterality: Bilateral;   PORT-A-CATH REMOVAL Left 01/22/2018   Procedure: REMOVAL PORT-A-CATH;  Surgeon: Alphonsa Overall, MD;  Location: [place];  Service: General;  Laterality: Left;   PORTACATH PLACEMENT N/A 08/02/2017   Procedure: INSERTION PORT-A-CATH;  Surgeon: Alphonsa Overall, MD;  Location: [place];  Service: General;  Laterality: N/A;   UTERINE ARTERY EMBOLIZATION  08-16-2010   Patient Active Problem List   Diagnosis Date Noted   Cancer of right breast, stage 2 ([place]) 01/22/2018   Genetic testing 08/18/2017   BRCA2 positive 08/18/2017   Family history of breast cancer    Family history of ovarian cancer    Family  history of prostate cancer    Malignant neoplasm of upper-outer quadrant of right breast in female, estrogen receptor negative ([place]) 07/25/2017   Perforated bowel ([place]) 05/13/2015   Free intraperitoneal air 05/12/2015   Submucosal lesion of esophagus 10/06/2014   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   S/P gastric bypass 10/06/2014   Diabetes ([place]) 09/24/2014   Morbid obesity ([place]) 03/20/2014    PCP: Theadore Nan, MD  REFERRING PROVIDER: Nicholas Lose, MD  REFERRING DIAG: Right UE lymphedema  THERAPY DIAG:  Malignant  neoplasm of upper-outer quadrant of right breast in female, estrogen receptor negative ([place])  ONSET DATE: 2019 with recent exacerbation  Rationale for Evaluation and Treatment: Rehabilitation  SUBJECTIVE:                                                                                                                                                                                           SUBJECTIVE STATEMENT:  I have been wearing my sleeve (jobst elvarex )but she lost her glove about 2 months ago and if she wears her sleeve without it her hand swells.I tried wearing the sleeve at night  but her hand got really swollen. I have a night garment but I don't think its right. It doesn't have foam in it it looks more like a little sleeve. She reports no problems with her shoulders. I realized when I wear my sports bra it makes me sore  at my neck and under my arm.  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  01/22/18- underwent bilateral mastectomy and a SLNB on the right side with 0/6 positive LN's , Pt  completed chemotherapy and radiation. She has been seen here previously for lymphedema and shoulder ROM.  She has a night garment that she does not feel fits right.  PAIN:  Are you having pain? Yes NPRS scale: 1/10 Pain location: right shoulder Pain orientation: Right and Upper  PAIN TYPE: aching and dull Pain description: intermittent  Aggravating factors: not sure, maybe her sports bra or walking her big dog Relieving factors: not sure  PRECAUTIONS: Other: Right UE lymphedema  WEIGHT BEARING RESTRICTIONS: No  FALLS:  Has patient fallen in last 6 months? No  LIVING ENVIRONMENT: Lives with: lives with their spouse, and 2 grown kids. Lives in: House/apartment Stairs: Yes; Internal: 14 steps; on left going up and External: 4 steps; can reach both Has following equipment at home: None  OCCUPATION:  NONE  LEISURE: spend time with family, work out,church,read,make soaps  HAND DOMINANCE: right   PRIOR LEVEL OF  FUNCTION: Independent  PATIENT GOALS: Decrease right UE sweling   OBJECTIVE:  COGNITION: Overall cognitive status: Within functional limits for tasks assessed   PALPATION:   OBSERVATIONS / OTHER ASSESSMENTS: significant swelling right UE  SENSATION: Light touch: Appears intact POSTURE: forward head, rounded shoulders  UPPER EXTREMITY AROM/PROM: WFL   CERVICAL AROM: All within functional:  UPPER EXTREMITY STRENGTH: WNL   L LYMPHEDEMA ASSESSMENTS:   SURGERY TYPE/DATE: Bilateral Mastectomies with Right SLNB  NUMBER OF LYMPH NODES REMOVED: 0/6  CHEMOTHERAPY: YES  RADIATION:YES  HORMONE TREATMENT:   INFECTIONS: no  LYMPHEDEMA ASSESSMENTS:   LANDMARK RIGHT  eval   35.4  10 cm proximal to olecranon process 37.2  Olecranon process 32.0  10 cm proximal to ulnar styloid process 31.5  Just proximal to ulnar styloid process 21.7  Across hand at thumb web space 21.0  At base of 2nd digit 6.85  (Blank rows = not tested)  LANDMARK LEFT  eval  axilla 34.5  10 cm proximal to olecranon process 32.8  Olecranon process 26.4  10 cm proximal to ulnar styloid process 23.8  Just proximal to ulnar styloid process 18.0  Across hand at thumb web space 19.2  At base of 2nd digit 6.2  (Blank rows = not tested)   Circumference of chest 97.8    Lymphedema Life Impact Scale: 16.18   TODAY'S TREATMENT:                                                                                                                                         DATE: 11/23/2022   PATIENT EDUCATION:  Education details: discussed POC for CDT, purpose of wrapping, MLD, importance of compliance, Day and night garments, replacing garments,  always managing Person educated: Patient Education method: Explanation Education comprehension: verbalized understanding  HOME EXERCISE  PROGRAM: None given today  ASSESSMENT:  CLINICAL IMPRESSION: Patient is a 57 y.o. female who was seen today for physical therapy evaluation and treatment for Right UE lymphedema..She has been seen before in the past, but not since 2020. She has never replaced her garments and has lost her glove within the last 2 months. She has a night garment but is not sure what it is. She will bring it next time and she says it is very loose. We discussed CDT and she is willing to come in 3 x's per week. She is a little concerned about her co-pays and I advised her to talk with Leeroy Bock about it. She will benefit from skilled PT for education in lymphedema, review of wrapping and impt of compliance, MLD,  Exercise, skin care and assistance with purchasing correct garments for day and night so she may manage it independently  OBJECTIVE IMPAIRMENTS: decreased knowledge of condition, decreased knowledge of use of DME, increased edema, and postural dysfunction.   ACTIVITY LIMITATIONS: can do all but with some fatigue in arm  PARTICIPATION LIMITATIONS:  takes part in most activities without limitation but avoids heavy lifting  PERSONAL FACTORS: 3+ comorbidities: bilateral Mastectomies with Right SLNB, chemo and radiation  are also affecting patient's functional outcome.   REHAB POTENTIAL: Good  CLINICAL DECISION MAKING: Stable/uncomplicated  EVALUATION COMPLEXITY: Low  GOALS: Goals reviewed with patient? Yes  SHORT TERM GOALS: Target date: 12/14/2022  Pt will have decreased edema reduction at 10 cm prox to ulna by 2 cm Baseline:31.5 Goal status: INITIAL  2.  Pt will have edema reduction by 1.5 cm at 10 cm prox to olecranon Baseline: 37.2 Goal status: INITIAL  3.  Pt will be compliant and tolerant of compression bandaging Baseline:  Goal status: INITIAL      LONG TERM GOALS: Target date: 01/04/2023  Pt will be independent with MLD to assist with decreasing edema Baseline:  Goal status:  INITIAL  2.  Pt will have edema reduction by 4 cm at 10 cm prox to ulna Baseline: 31.2 Goal status: INITIAL  3.  Pt will be fit for compression garments and will be independent in their use. Baseline:  Goal status: INITIAL  4.  Pt will have edema reduction at Olecranon by 3 cm Baseline: 32 Goal status: INITIAL PLAN:  PT FREQUENCY: 3x/week  PT DURATION: 6 weeks  PLANNED INTERVENTIONS: Therapeutic exercises, Therapeutic activity, Patient/Family education, Self Care, Orthotic/Fit training, Manual lymph drainage, Compression bandaging, Vasopneumatic device, Manual therapy, and Re-evaluation  PLAN FOR NEXT SESSION: MLD, compression bandaging, see what her night garment is,Flexitouch?, check axillary region for swelling   Claris Pong, PT 11/23/2022, 3:11 PM

## 2022-11-23 ENCOUNTER — Other Ambulatory Visit: Payer: Self-pay

## 2022-11-23 ENCOUNTER — Ambulatory Visit: Payer: 59 | Attending: Hematology and Oncology

## 2022-11-23 DIAGNOSIS — Z171 Estrogen receptor negative status [ER-]: Secondary | ICD-10-CM | POA: Diagnosis not present

## 2022-11-23 DIAGNOSIS — I890 Lymphedema, not elsewhere classified: Secondary | ICD-10-CM | POA: Diagnosis present

## 2022-11-23 DIAGNOSIS — C50411 Malignant neoplasm of upper-outer quadrant of right female breast: Secondary | ICD-10-CM | POA: Insufficient documentation

## 2022-11-28 ENCOUNTER — Encounter: Payer: Self-pay | Admitting: Physical Therapy

## 2022-11-28 ENCOUNTER — Ambulatory Visit: Payer: 59 | Admitting: Physical Therapy

## 2022-11-28 DIAGNOSIS — C50411 Malignant neoplasm of upper-outer quadrant of right female breast: Secondary | ICD-10-CM | POA: Diagnosis not present

## 2022-11-28 DIAGNOSIS — I890 Lymphedema, not elsewhere classified: Secondary | ICD-10-CM

## 2022-11-28 NOTE — Therapy (Signed)
OUTPATIENT PHYSICAL THERAPY ONCOLOGY TREATMENT  Patient Name: Kara Crawford MRN: [id number] DOB:April 17, 1966, 57 y.o., female Today's Date: 11/28/2022  END OF SESSION:  PT End of Session - 11/28/22 1106     Visit Number 2    Number of Visits 18    Date for PT Re-Evaluation 01/04/23    PT Start Time 1104    PT Stop Time 1208    PT Time Calculation (min) 64 min    Activity Tolerance Patient tolerated treatment well    Behavior During Therapy WFL for tasks assessed/performed             Past Medical History:  Diagnosis Date   Anemia    anemia prior to uterine emboliztion procedure.   Breast cancer ([place])    Diabetes mellitus without complication ([employer])    type 2   Family history of breast cancer    Family history of ovarian cancer    Family history of prostate cancer    Fibroids    Headache    Heart murmur    History of radiation therapy 03/07/18- 04/18/18   Right Chest wall 50 Gy in 25 fractions, Right supraclavicular fossa 50 Gy in 25 fractions, Right Chest wall scar boost 10 Gy in 5 fractions.    Hyperlipidemia    Hypertension    Past Surgical History:  Procedure Laterality Date   BREAST SURGERY     double mastectomy   BREATH TEK H PYLORI N/A 04/17/2014   Procedure: BREATH TEK H PYLORI;  Surgeon: Shann Medal, MD;  Location: [facility];  Service: General;  Laterality: N/A;   CESAREAN SECTION     x3- normal development   CYSTOSCOPY N/A 07/18/2018   Procedure: CYSTOSCOPY;  Surgeon: Christophe Louis, MD;  Location: [facility];  Service: Gynecology;  Laterality: N/A;   ESOPHAGOGASTRODUODENOSCOPY N/A 08/20/2015   Procedure: ESOPHAGOGASTRODUODENOSCOPY (EGD);  Surgeon: Alphonsa Overall, MD;  Location: [facility];  Service: General;  Laterality: N/A;   EXPLORATORY LAPAROTOMY  05/13/2015   GASTRIC ROUX-EN-Y N/A 10/06/2014   Procedure: LAPAROSCOPIC ROUX-EN-Y GASTRIC BYPASS WITH UPPER ENDOSCOPY;  Surgeon: Pedro Earls, MD;  Location: [facility];  Service: General;  Laterality: N/A;    LAPAROSCOPIC SALPINGO OOPHERECTOMY Bilateral 07/18/2018   Procedure: LAPAROSCOPIC SALPINGO OOPHORECTOMY;  Surgeon: Christophe Louis, MD;  Location: [facility];  Service: Gynecology;  Laterality: Bilateral;  possible laparotomy with abdominal bilateral salpingoophorectomy   LAPAROSCOPY N/A 05/12/2015   Procedure: LAPAROSCOPY REPAIR OF PERFORATED BOWEL;  Surgeon: Alphonsa Overall, MD;  Location: [place];  Service: General;  Laterality: N/A;   MASTECTOMY WITH RADIOACTIVE SEED GUIDED EXCISION AND AXILLARY SENTINEL LYMPH NODE BIOPSY Bilateral 01/22/2018   Procedure: BILATERAL MASTECTOMIES WITH RADIOACTIVE SEED TARGETED RIGHT AXILLARY [place] NODE EXCISION AND RIGHT SENTINEL LYMPH NODE BIOPSY;  Surgeon: Alphonsa Overall, MD;  Location: [place];  Service: General;  Laterality: Bilateral;   PORT-A-CATH REMOVAL Left 01/22/2018   Procedure: REMOVAL PORT-A-CATH;  Surgeon: Alphonsa Overall, MD;  Location: [place];  Service: General;  Laterality: Left;   PORTACATH PLACEMENT N/A 08/02/2017   Procedure: INSERTION PORT-A-CATH;  Surgeon: Alphonsa Overall, MD;  Location: [place];  Service: General;  Laterality: N/A;   UTERINE ARTERY EMBOLIZATION  08-16-2010   Patient Active Problem List   Diagnosis Date Noted   Cancer of right breast, stage 2 ([place]) 01/22/2018   Genetic testing 08/18/2017   BRCA2 positive 08/18/2017   Family history of breast cancer    Family history of ovarian cancer    Family  history of prostate cancer    Malignant neoplasm of upper-outer quadrant of right breast in female, estrogen receptor negative ([place]) 07/25/2017   Perforated bowel ([place]) 05/13/2015   Free intraperitoneal air 05/12/2015   Submucosal lesion of esophagus 10/06/2014   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   S/P gastric bypass 10/06/2014   Diabetes ([place]) 09/24/2014   Morbid obesity ([place]) 03/20/2014    PCP: Theadore Nan, MD  REFERRING PROVIDER: Nicholas Lose, MD  REFERRING DIAG: Right UE lymphedema  THERAPY DIAG:  Lymphedema  of arm  Malignant neoplasm of upper-outer quadrant of right breast in female, estrogen receptor negative ([place])  ONSET DATE: 2019 with recent exacerbation  Rationale for Evaluation and Treatment: Rehabilitation  SUBJECTIVE:                                                                                                                                                                                           SUBJECTIVE STATEMENT:  I have been wearing my sleeve (jobst elvarex )but she lost her glove about 2 months ago and if she wears her sleeve without it her hand swells.I tried wearing the sleeve at night  but her hand got really swollen. I have a night garment but I don't think its right. It doesn't have foam in it it looks more like a little sleeve. She reports no problems with her shoulders. I realized when I wear my sports bra it makes me sore  at my neck and under my arm.  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  01/22/18- underwent bilateral mastectomy and a SLNB on the right side with 0/6 positive LN's , Pt  completed chemotherapy and radiation. She has been seen here previously for lymphedema and shoulder ROM.  She has a night garment that she does not feel fits right.  PAIN:  Are you having pain? Yes NPRS scale: 1/10 Pain location: right shoulder Pain orientation: Right and Upper  PAIN TYPE: aching and dull Pain description: intermittent  Aggravating factors: not sure, maybe her sports bra or walking her big dog Relieving factors: not sure  PRECAUTIONS: Other: Right UE lymphedema  WEIGHT BEARING RESTRICTIONS: No  FALLS:  Has patient fallen in last 6 months? No  LIVING ENVIRONMENT: Lives with: lives with their spouse, and 2 grown kids. Lives in: House/apartment Stairs: Yes; Internal: 14 steps; on left going up and External: 4 steps; can reach both Has following equipment at home:  None  OCCUPATION: NONE  LEISURE: spend time with family, work out,church,read,make soaps  HAND DOMINANCE: right  PRIOR LEVEL OF FUNCTION: Independent  PATIENT GOALS: Decrease right UE sweling   OBJECTIVE:  COGNITION: Overall cognitive status: Within functional limits for tasks assessed   PALPATION:   OBSERVATIONS / OTHER ASSESSMENTS: significant swelling right UE  SENSATION: Light touch: Appears intact POSTURE: forward head, rounded shoulders  UPPER EXTREMITY AROM/PROM: WFL   CERVICAL AROM: All within functional:  UPPER EXTREMITY STRENGTH: WNL   L LYMPHEDEMA ASSESSMENTS:   SURGERY TYPE/DATE: Bilateral Mastectomies with Right SLNB  NUMBER OF LYMPH NODES REMOVED: 0/6  CHEMOTHERAPY: YES  RADIATION:YES  HORMONE TREATMENT:   INFECTIONS: no  LYMPHEDEMA ASSESSMENTS:   LANDMARK RIGHT  eval RIGHT 11/28/22   35.4 36.2  10 cm proximal to olecranon process 37.2 35.5  Olecranon process 32.0 30.5  10 cm proximal to ulnar styloid process 31.5 29.2  Just proximal to ulnar styloid process 21.7 21.1  Across hand at thumb web space 21.0 20.2  At base of 2nd digit 6.85 6.5  (Blank rows = not tested)  LANDMARK LEFT  eval  axilla 34.5  10 cm proximal to olecranon process 32.8  Olecranon process 26.4  10 cm proximal to ulnar styloid process 23.8  Just proximal to ulnar styloid process 18.0  Across hand at thumb web space 19.2  At base of 2nd digit 6.2  (Blank rows = not tested)   Circumference of chest 97.8    Lymphedema Life Impact Scale: 16.18   TODAY'S TREATMENT:                                                                                                                                         DATE:  1/249/2024: In supine: Short neck, 5 diaphragmatic breaths, L axillary nodes and establishment of interaxillary pathway, R inguinal nodes and establishment of axilloinguinal pathway, then R UE working proximal to distal, moving inner upper arm outwards  and upwards, and doing both sides of forearms, spending extra time in any areas of fibrosis then retracing all steps   Compression bandaging as follows: applied lotion to RUE then donned TG soft from hand to axilla, artiflex from hand to axilla, mollelast to digits 1-5, 1 6cm bandage at hand, 1 8 cm bandage from wrist to axilla with x at antecubital fossa then 1 12 cm bandage from wrist to axilla in herringbone fashion   PATIENT EDUCATION:  Education details: circaid profile compression garment for night time use, fine to exercise in bandaging Person educated: Patient Education method: Demonstration Education comprehension: verbalized understanding  HOME EXERCISE PROGRAM: None given today  ASSESSMENT:  CLINICAL IMPRESSION: Began MLD and compression bandaging today. Pt already demonstrates good reduction since wearing her sleeve and wrapping just her hand. Her entire UE circumferences have decreased except for 15 cm proximal to olecranon. Once she reaches maximal reduction she will benefit from getting measured for a custom flat knit sleeve and glove and a CircAid profile. Showed pt an  sample profile and she was interested in this. She forgot to bring her old night time garment in but plans to next session.   OBJECTIVE IMPAIRMENTS: decreased knowledge of condition, decreased knowledge of use of DME, increased edema, and postural dysfunction.   ACTIVITY LIMITATIONS: can do all but with some fatigue in arm  PARTICIPATION LIMITATIONS:  takes part in most activities without limitation but avoids heavy lifting  PERSONAL FACTORS: 3+ comorbidities: bilateral Mastectomies with Right SLNB, chemo and radiation  are also affecting patient's functional outcome.   REHAB POTENTIAL: Good  CLINICAL DECISION MAKING: Stable/uncomplicated  EVALUATION COMPLEXITY: Low  GOALS: Goals reviewed with patient? Yes  SHORT TERM GOALS: Target date: 12/14/2022  Pt will have decreased edema reduction at 10 cm  prox to ulna by 2 cm Baseline:31.5 Goal status: INITIAL  2.  Pt will have edema reduction by 1.5 cm at 10 cm prox to olecranon Baseline: 37.2 Goal status: INITIAL  3.  Pt will be compliant and tolerant of compression bandaging Baseline:  Goal status: INITIAL      LONG TERM GOALS: Target date: 01/04/2023  Pt will be independent with MLD to assist with decreasing edema Baseline:  Goal status: INITIAL  2.  Pt will have edema reduction by 4 cm at 10 cm prox to ulna Baseline: 31.2 Goal status: INITIAL  3.  Pt will be fit for compression garments and will be independent in their use. Baseline:  Goal status: INITIAL  4.  Pt will have edema reduction at Olecranon by 3 cm Baseline: 32 Goal status: INITIAL PLAN:  PT FREQUENCY: 3x/week  PT DURATION: 6 weeks  PLANNED INTERVENTIONS: Therapeutic exercises, Therapeutic activity, Patient/Family education, Self Care, Orthotic/Fit training, Manual lymph drainage, Compression bandaging, Vasopneumatic device, Manual therapy, and Re-evaluation  PLAN FOR NEXT SESSION: MLD, compression bandaging, see what her night garment is,Flexitouch?, check axillary region for swelling; pt interested in CircAid profile night time garment   [employer], PT 11/28/2022, 12:24 PM

## 2022-11-30 ENCOUNTER — Ambulatory Visit: Payer: 59

## 2022-11-30 DIAGNOSIS — I890 Lymphedema, not elsewhere classified: Secondary | ICD-10-CM

## 2022-11-30 DIAGNOSIS — C50411 Malignant neoplasm of upper-outer quadrant of right female breast: Secondary | ICD-10-CM | POA: Diagnosis not present

## 2022-11-30 DIAGNOSIS — Z171 Estrogen receptor negative status [ER-]: Secondary | ICD-10-CM

## 2022-11-30 NOTE — Therapy (Signed)
OUTPATIENT PHYSICAL THERAPY ONCOLOGY TREATMENT  Patient Name: Kara Crawford MRN: [id number] DOB:20-May-1966, 57 y.o., female Today's Date: 11/30/2022  END OF SESSION:  PT End of Session - 11/30/22 0959     Visit Number 3    Number of Visits 18    Date for PT Re-Evaluation 01/04/23    PT Start Time 1000    PT Stop Time 1053    PT Time Calculation (min) 53 min    Activity Tolerance Patient tolerated treatment well    Behavior During Therapy WFL for tasks assessed/performed             Past Medical History:  Diagnosis Date   Anemia    anemia prior to uterine emboliztion procedure.   Breast cancer ([place])    Diabetes mellitus without complication ([place])    type 2   Family history of breast cancer    Family history of ovarian cancer    Family history of prostate cancer    Fibroids    Headache    Heart murmur    History of radiation therapy 03/07/18- 04/18/18   Right Chest wall 50 Gy in 25 fractions, Right supraclavicular fossa 50 Gy in 25 fractions, Right Chest wall scar boost 10 Gy in 5 fractions.    Hyperlipidemia    Hypertension    Past Surgical History:  Procedure Laterality Date   BREAST SURGERY     double mastectomy   BREATH TEK H PYLORI N/A 04/17/2014   Procedure: BREATH TEK H PYLORI;  Surgeon: Shann Medal, MD;  Location: [facility];  Service: General;  Laterality: N/A;   CESAREAN SECTION     x3- normal development   CYSTOSCOPY N/A 07/18/2018   Procedure: CYSTOSCOPY;  Surgeon: Christophe Louis, MD;  Location: [facility];  Service: Gynecology;  Laterality: N/A;   ESOPHAGOGASTRODUODENOSCOPY N/A 08/20/2015   Procedure: ESOPHAGOGASTRODUODENOSCOPY (EGD);  Surgeon: Alphonsa Overall, MD;  Location: [facility];  Service: General;  Laterality: N/A;   EXPLORATORY LAPAROTOMY  05/13/2015   GASTRIC ROUX-EN-Y N/A 10/06/2014   Procedure: LAPAROSCOPIC ROUX-EN-Y GASTRIC BYPASS WITH UPPER ENDOSCOPY;  Surgeon: Pedro Earls, MD;  Location: [facility];  Service: General;  Laterality: N/A;    LAPAROSCOPIC SALPINGO OOPHERECTOMY Bilateral 07/18/2018   Procedure: LAPAROSCOPIC SALPINGO OOPHORECTOMY;  Surgeon: Christophe Louis, MD;  Location: [facility];  Service: Gynecology;  Laterality: Bilateral;  possible laparotomy with abdominal bilateral salpingoophorectomy   LAPAROSCOPY N/A 05/12/2015   Procedure: LAPAROSCOPY REPAIR OF PERFORATED BOWEL;  Surgeon: Alphonsa Overall, MD;  Location: [facility];  Service: General;  Laterality: N/A;   MASTECTOMY WITH RADIOACTIVE SEED GUIDED EXCISION AND AXILLARY SENTINEL LYMPH NODE BIOPSY Bilateral 01/22/2018   Procedure: BILATERAL MASTECTOMIES WITH RADIOACTIVE SEED TARGETED RIGHT AXILLARY [place] NODE EXCISION AND RIGHT SENTINEL LYMPH NODE BIOPSY;  Surgeon: Alphonsa Overall, MD;  Location: [facility];  Service: General;  Laterality: Bilateral;   PORT-A-CATH REMOVAL Left 01/22/2018   Procedure: REMOVAL PORT-A-CATH;  Surgeon: Alphonsa Overall, MD;  Location: [facility];  Service: General;  Laterality: Left;   PORTACATH PLACEMENT N/A 08/02/2017   Procedure: INSERTION PORT-A-CATH;  Surgeon: Alphonsa Overall, MD;  Location: [facility];  Service: General;  Laterality: N/A;   UTERINE ARTERY EMBOLIZATION  08-16-2010   Patient Active Problem List   Diagnosis Date Noted   Cancer of right breast, stage 2 ([place]) 01/22/2018   Genetic testing 08/18/2017   BRCA2 positive 08/18/2017   Family history of breast cancer    Family history of ovarian cancer    Family  history of prostate cancer    Malignant neoplasm of upper-outer quadrant of right breast in female, estrogen receptor negative ([place]) 07/25/2017   Perforated bowel ([place]) 05/13/2015   Free intraperitoneal air 05/12/2015   Submucosal lesion of esophagus 10/06/2014   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   S/P gastric bypass 10/06/2014   Diabetes ([place]) 09/24/2014   Morbid obesity ([place]) 03/20/2014    PCP: Theadore Nan, MD  REFERRING PROVIDER: Nicholas Lose, MD  REFERRING DIAG: Right UE lymphedema  THERAPY DIAG:  Lymphedema  of arm  Malignant neoplasm of upper-outer quadrant of right breast in female, estrogen receptor negative ([place])  ONSET DATE: 2019 with recent exacerbation  Rationale for Evaluation and Treatment: Rehabilitation  SUBJECTIVE:                                                                                                                                                                                           SUBJECTIVE STATEMENT:  I found my night garment and brought it. It is a tribute [employer] writer and has a power sleeve. I was using the flexi touch at home and I do it Mon, Wed, Fri. I stayed wrapped until this morning. My arm feels so much better.  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  01/22/18- underwent bilateral mastectomy and a SLNB on the right side with 0/6 positive LN's , Pt  completed chemotherapy and radiation. She has been seen here previously for lymphedema and shoulder ROM.  She has a night garment that she does not feel fits right.  PAIN:  Are you having pain? Yes NPRS scale: 1/10 Pain location: right shoulder Pain orientation: Right and Upper  PAIN TYPE: aching and dull Pain description: intermittent  Aggravating factors: not sure, maybe her sports bra or walking her big dog Relieving factors: not sure  PRECAUTIONS: Other: Right UE lymphedema  WEIGHT BEARING RESTRICTIONS: No  FALLS:  Has patient fallen in last 6 months? No  LIVING ENVIRONMENT: Lives with: lives with their spouse, and 2 grown kids. Lives in: House/apartment Stairs: Yes; Internal: 14 steps; on left going up and External: 4 steps; can reach both Has following equipment at home: None  OCCUPATION: NONE  LEISURE: spend time with family, work out,church,read,make soaps  HAND DOMINANCE: right   PRIOR LEVEL OF FUNCTION: Independent  PATIENT GOALS: Decrease right UE  sweling   OBJECTIVE:  COGNITION: Overall cognitive status: Within functional limits for tasks assessed   PALPATION:   OBSERVATIONS / OTHER ASSESSMENTS: significant swelling right UE  SENSATION: Light touch: Appears intact  POSTURE: forward head, rounded shoulders  UPPER EXTREMITY AROM/PROM: WFL   CERVICAL AROM: All within functional:  UPPER EXTREMITY STRENGTH: WNL   L LYMPHEDEMA ASSESSMENTS:   SURGERY TYPE/DATE: Bilateral Mastectomies with Right SLNB  NUMBER OF LYMPH NODES REMOVED: 0/6  CHEMOTHERAPY: YES  RADIATION:YES  HORMONE TREATMENT:   INFECTIONS: no  LYMPHEDEMA ASSESSMENTS:   LANDMARK RIGHT  eval RIGHT 11/28/22   35.4 36.2  10 cm proximal to olecranon process 37.2 35.5  Olecranon process 32.0 30.5  10 cm proximal to ulnar styloid process 31.5 29.2  Just proximal to ulnar styloid process 21.7 21.1  Across hand at thumb web space 21.0 20.2  At base of 2nd digit 6.85 6.5  (Blank rows = not tested)  LANDMARK LEFT  eval  axilla 34.5  10 cm proximal to olecranon process 32.8  Olecranon process 26.4  10 cm proximal to ulnar styloid process 23.8  Just proximal to ulnar styloid process 18.0  Across hand at thumb web space 19.2  At base of 2nd digit 6.2  (Blank rows = not tested)   Circumference of chest 97.8    Lymphedema Life Impact Scale: 16.18   TODAY'S TREATMENT:                                                                                                                                          DATE:   11/30/2022 Pt donned tribute night with power sleeve. Slightly difficult to get on but was able to get all the way up In supine: Short neck, 5 diaphragmatic breaths, L axillary nodes and establishment of interaxillary pathway, R inguinal nodes and establishment of axilloinguinal pathway, then R UE working proximal to distal, moving inner upper arm outwards and upwards, and doing both sides of forearms, spending extra time in any areas of  fibrosis then retracing all steps   Compression bandaging as follows: applied lotion to RUE then donned TG soft from hand to axilla, artiflex from hand to axilla, mollelast to digits 1-5, 1 6cm bandage at hand, 1 8 cm bandage from wrist to axilla with x at antecubital fossa then 1 12 cm bandage from wrist to axilla in spiral.   11/23/2022: In supine: Short neck, 5 diaphragmatic breaths, L axillary nodes and establishment of interaxillary pathway, R inguinal nodes and establishment of axilloinguinal pathway, then R UE working proximal to distal, moving inner upper arm outwards and upwards, and doing both sides of forearms, spending extra time in any areas of fibrosis then retracing all steps   Compression bandaging as follows: applied lotion to RUE then donned TG soft from hand to axilla, artiflex from hand to axilla, mollelast to digits 1-5, 1 6cm bandage at hand, 1 8 cm bandage from wrist to axilla with x at antecubital fossa then 1 12 cm bandage from wrist to axilla in herringbone fashion   PATIENT EDUCATION:  Education details: circaid profile  compression garment for night time use, fine to exercise in bandaging Person educated: Patient Education method: Demonstration Education comprehension: verbalized understanding  HOME EXERCISE PROGRAM: None given today  ASSESSMENT:  CLINICAL IMPRESSION: Pt came in unwrapped today.Pleased with progress and how much better her arm feels. Continued MLD and compression bandaging. Pt may try bandaging herself before her next visit so we can see her wrap. Her night garment is a tribute but without velcro. It fit but was a little difficult to get on     OBJECTIVE IMPAIRMENTS: decreased knowledge of condition, decreased knowledge of use of DME, increased edema, and postural dysfunction.   ACTIVITY LIMITATIONS: can do all but with some fatigue in arm  PARTICIPATION LIMITATIONS:  takes part in most activities without limitation but avoids heavy  lifting  PERSONAL FACTORS: 3+ comorbidities: bilateral Mastectomies with Right SLNB, chemo and radiation  are also affecting patient's functional outcome.   REHAB POTENTIAL: Good  CLINICAL DECISION MAKING: Stable/uncomplicated  EVALUATION COMPLEXITY: Low  GOALS: Goals reviewed with patient? Yes  SHORT TERM GOALS: Target date: 12/14/2022  Pt will have decreased edema reduction at 10 cm prox to ulna by 2 cm Baseline:31.5 Goal status: INITIAL  2.  Pt will have edema reduction by 1.5 cm at 10 cm prox to olecranon Baseline: 37.2 Goal status: INITIAL  3.  Pt will be compliant and tolerant of compression bandaging Baseline:  Goal status: INITIAL      LONG TERM GOALS: Target date: 01/04/2023  Pt will be independent with MLD to assist with decreasing edema Baseline:  Goal status: INITIAL  2.  Pt will have edema reduction by 4 cm at 10 cm prox to ulna Baseline: 31.2 Goal status: INITIAL  3.  Pt will be fit for compression garments and will be independent in their use. Baseline:  Goal status: INITIAL  4.  Pt will have edema reduction at Olecranon by 3 cm Baseline: 32 Goal status: INITIAL PLAN:  PT FREQUENCY: 3x/week  PT DURATION: 6 weeks  PLANNED INTERVENTIONS: Therapeutic exercises, Therapeutic activity, Patient/Family education, Self Care, Orthotic/Fit training, Manual lymph drainage, Compression bandaging, Vasopneumatic device, Manual therapy, and Re-evaluation  PLAN FOR NEXT SESSION:  Check pts wrapMLD, compression bandaging,  night garment is, tribute without and power sleeve, check axillary region for swelling; pt interested in CircAid profile night time garment   Claris Pong, PT 11/30/2022, 10:59 AM

## 2022-12-02 ENCOUNTER — Ambulatory Visit: Payer: 59 | Attending: Hematology and Oncology

## 2022-12-02 DIAGNOSIS — C50411 Malignant neoplasm of upper-outer quadrant of right female breast: Secondary | ICD-10-CM | POA: Diagnosis present

## 2022-12-02 DIAGNOSIS — Z171 Estrogen receptor negative status [ER-]: Secondary | ICD-10-CM | POA: Diagnosis present

## 2022-12-02 DIAGNOSIS — I890 Lymphedema, not elsewhere classified: Secondary | ICD-10-CM | POA: Insufficient documentation

## 2022-12-02 NOTE — Therapy (Signed)
OUTPATIENT PHYSICAL THERAPY ONCOLOGY TREATMENT  Patient Name: Kara Crawford MRN: [id number] DOB:Jun 20, 1966, 57 y.o., female Today's Date: 12/02/2022  END OF SESSION:  PT End of Session - 12/02/22 1100     Visit Number 4    Number of Visits 18    Date for PT Re-Evaluation 01/04/23    PT Start Time 1101    PT Stop Time 1202    PT Time Calculation (min) 61 min    Activity Tolerance Patient tolerated treatment well    Behavior During Therapy WFL for tasks assessed/performed             Past Medical History:  Diagnosis Date   Anemia    anemia prior to uterine emboliztion procedure.   Breast cancer ([place])    Diabetes mellitus without complication ([place])    type 2   Family history of breast cancer    Family history of ovarian cancer    Family history of prostate cancer    Fibroids    Headache    Heart murmur    History of radiation therapy 03/07/18- 04/18/18   Right Chest wall 50 Gy in 25 fractions, Right supraclavicular fossa 50 Gy in 25 fractions, Right Chest wall scar boost 10 Gy in 5 fractions.    Hyperlipidemia    Hypertension    Past Surgical History:  Procedure Laterality Date   BREAST SURGERY     double mastectomy   BREATH TEK H PYLORI N/A 04/17/2014   Procedure: BREATH TEK H PYLORI;  Surgeon: Shann Medal, MD;  Location: [facility];  Service: General;  Laterality: N/A;   CESAREAN SECTION     x3- normal development   CYSTOSCOPY N/A 07/18/2018   Procedure: CYSTOSCOPY;  Surgeon: Christophe Louis, MD;  Location: [facility];  Service: Gynecology;  Laterality: N/A;   ESOPHAGOGASTRODUODENOSCOPY N/A 08/20/2015   Procedure: ESOPHAGOGASTRODUODENOSCOPY (EGD);  Surgeon: Alphonsa Overall, MD;  Location: [facility];  Service: General;  Laterality: N/A;   EXPLORATORY LAPAROTOMY  05/13/2015   GASTRIC ROUX-EN-Y N/A 10/06/2014   Procedure: LAPAROSCOPIC ROUX-EN-Y GASTRIC BYPASS WITH UPPER ENDOSCOPY;  Surgeon: Pedro Earls, MD;  Location: [facility];  Service: General;  Laterality: N/A;    LAPAROSCOPIC SALPINGO OOPHERECTOMY Bilateral 07/18/2018   Procedure: LAPAROSCOPIC SALPINGO OOPHORECTOMY;  Surgeon: Christophe Louis, MD;  Location: [facility];  Service: Gynecology;  Laterality: Bilateral;  possible laparotomy with abdominal bilateral salpingoophorectomy   LAPAROSCOPY N/A 05/12/2015   Procedure: LAPAROSCOPY REPAIR OF PERFORATED BOWEL;  Surgeon: Alphonsa Overall, MD;  Location: [facility];  Service: General;  Laterality: N/A;   MASTECTOMY WITH RADIOACTIVE SEED GUIDED EXCISION AND AXILLARY SENTINEL LYMPH NODE BIOPSY Bilateral 01/22/2018   Procedure: BILATERAL MASTECTOMIES WITH RADIOACTIVE SEED TARGETED RIGHT AXILLARY [place] NODE EXCISION AND RIGHT SENTINEL LYMPH NODE BIOPSY;  Surgeon: Alphonsa Overall, MD;  Location: [facility];  Service: General;  Laterality: Bilateral;   PORT-A-CATH REMOVAL Left 01/22/2018   Procedure: REMOVAL PORT-A-CATH;  Surgeon: Alphonsa Overall, MD;  Location: [facility];  Service: General;  Laterality: Left;   PORTACATH PLACEMENT N/A 08/02/2017   Procedure: INSERTION PORT-A-CATH;  Surgeon: Alphonsa Overall, MD;  Location: [facility];  Service: General;  Laterality: N/A;   UTERINE ARTERY EMBOLIZATION  08-16-2010   Patient Active Problem List   Diagnosis Date Noted   Cancer of right breast, stage 2 ([place]) 01/22/2018   Genetic testing 08/18/2017   BRCA2 positive 08/18/2017   Family history of breast cancer    Family history of ovarian cancer    Family  history of prostate cancer    Malignant neoplasm of upper-outer quadrant of right breast in female, estrogen receptor negative ([place]) 07/25/2017   Perforated bowel ([place]) 05/13/2015   Free intraperitoneal air 05/12/2015   Submucosal lesion of esophagus 10/06/2014   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   S/P gastric bypass 10/06/2014   Diabetes ([place]) 09/24/2014   Morbid obesity ([place]) 03/20/2014    PCP: Theadore Nan, MD  REFERRING PROVIDER: Nicholas Lose, MD  REFERRING DIAG: Right UE lymphedema  THERAPY DIAG:  Lymphedema  of arm  Malignant neoplasm of upper-outer quadrant of right breast in female, estrogen receptor negative ([place])  ONSET DATE: 2019 with recent exacerbation  Rationale for Evaluation and Treatment: Rehabilitation  SUBJECTIVE:                                                                                                                                                                                           SUBJECTIVE STATEMENT: I left the wrap from Wednesday on until about 5 am this morning. I took it off to take a shower, then I rewrapped so you could see it. 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  01/22/18- underwent bilateral mastectomy and a SLNB on the right side with 0/6 positive LN's , Pt  completed chemotherapy and radiation. She has been seen here previously for lymphedema and shoulder ROM.  She has a night garment that she does not feel fits right. (She brought in [place] without velcro;should fit when she reduces.  PAIN:  Are you having pain? Yes NPRS scale: 0/10 Pain location: right shoulder Pain orientation: Right and Upper  PAIN TYPE: aching and dull Pain description: intermittent  Aggravating factors: not sure, maybe her sports bra or walking her big dog Relieving factors: not sure  PRECAUTIONS: Other: Right UE lymphedema  WEIGHT BEARING RESTRICTIONS: No  FALLS:  Has patient fallen in last 6 months? No  LIVING ENVIRONMENT: Lives with: lives with their spouse, and 2 grown kids. Lives in: House/apartment Stairs: Yes; Internal: 14 steps; on left going up and External: 4 steps; can reach both Has following equipment at home: None  OCCUPATION: NONE  LEISURE: spend time with family, work out,church,read,make soaps  HAND DOMINANCE: right   PRIOR LEVEL OF FUNCTION: Independent  PATIENT GOALS: Decrease right UE  sweling   OBJECTIVE:  COGNITION: Overall cognitive status: Within functional limits for tasks assessed   PALPATION:   OBSERVATIONS / OTHER ASSESSMENTS: significant swelling right UE, mild inferior right axillary  swelling  SENSATION: Light touch: Appears intact POSTURE: forward head, rounded shoulders  UPPER EXTREMITY AROM/PROM: WFL   CERVICAL AROM: All within functional:  UPPER EXTREMITY STRENGTH: WNL   L LYMPHEDEMA ASSESSMENTS:   SURGERY TYPE/DATE: Bilateral Mastectomies with Right SLNB  NUMBER OF LYMPH NODES REMOVED: 0/6  CHEMOTHERAPY: YES  RADIATION:YES  HORMONE TREATMENT:   INFECTIONS: no  LYMPHEDEMA ASSESSMENTS:   LANDMARK RIGHT  eval RIGHT 11/28/22   35.4 36.2  10 cm proximal to olecranon process 37.2 35.5  Olecranon process 32.0 30.5  10 cm proximal to ulnar styloid process 31.5 29.2  Just proximal to ulnar styloid process 21.7 21.1  Across hand at thumb web space 21.0 20.2  At base of 2nd digit 6.85 6.5  (Blank rows = not tested)  LANDMARK LEFT  eval  axilla 34.5  10 cm proximal to olecranon process 32.8  Olecranon process 26.4  10 cm proximal to ulnar styloid process 23.8  Just proximal to ulnar styloid process 18.0  Across hand at thumb web space 19.2  At base of 2nd digit 6.2  (Blank rows = not tested)   Circumference of chest 97.8    Lymphedema Life Impact Scale: 16.18   TODAY'S TREATMENT:                                                                                                                                          DATE:   12/02/2022 Wraps removed by PT; pt wrapped hand with largest wrap but did last and did not cover the knuckles. It was also very loose. Finger wraps were spaced very wide apart with a lot of wrap at the wrist. She used the smallest wrap  on the arm from wrist to axilla. Showed pt how to use the table to hold her wrap. She felt she could do the wrap better by watching me closely today. In supine: Short  neck, superficial and deep abdominals, L axillary nodes and establishment of interaxillary pathway, R inguinal nodes and establishment of axilloinguinal pathway, then R UE working proximal to distal, moving inner upper arm outwards and upwards, and doing both sides of forearm and hand, spending extra time in any areas of fibrosis then retracing all steps  Compression bandaging as follows: (pt had already applied lotion, donned TG soft from hand to axilla,  mollelast to digits 1-5with TG soft pulled over it,artiflex hand to axilla,1 6cm bandage at hand, 1 8 cm bandage from wrist to axilla with x at antecubital fossa then 1 12 cm bandage from wrist to axilla in spiral. Verbalized to pt as I was doing it each step and gave pt written and illustrated handouts and link to MD [employer].  11/30/2022 Pt donned tribute night with power sleeve. Slightly difficult to get on but was able to get all the way up In supine: Short neck, 5 diaphragmatic breaths, L axillary nodes and  establishment of interaxillary pathway, R inguinal nodes and establishment of axilloinguinal pathway, then R UE working proximal to distal, moving inner upper arm outwards and upwards, and doing both sides of forearms, spending extra time in any areas of fibrosis then retracing all steps   Compression bandaging as follows: applied lotion to RUE then donned TG soft from hand to axilla, artiflex from hand to axilla, mollelast to digits 1-5, 1 6cm bandage at hand, 1 8 cm bandage from wrist to axilla with x at antecubital fossa then 1 12 cm bandage from wrist to axilla in spiral.   11/23/2022: In supine: Short neck, 5 diaphragmatic breaths, L axillary nodes and establishment of interaxillary pathway, R inguinal nodes and establishment of axilloinguinal pathway, then R UE working proximal to distal, moving inner upper arm outwards and upwards, and doing both sides of forearms, spending extra time in any areas of fibrosis then retracing all steps    Compression bandaging as follows: applied lotion to RUE then donned TG soft from hand to axilla, artiflex from hand to axilla, mollelast to digits 1-5, 1 6cm bandage at hand, 1 8 cm bandage from wrist to axilla with x at antecubital fossa then 1 12 cm bandage from wrist to axilla in herringbone fashion   PATIENT EDUCATION:  Education details: circaid profile compression garment for night time use, fine to exercise in bandaging Person educated: Patient Education method: Demonstration Education comprehension: verbalized understanding  HOME EXERCISE PROGRAM: None given today  ASSESSMENT:  CLINICAL IMPRESSION: Pt came in with a wrap she had applied this am. Wrap was not very well done, but pt had rushed.  She did the hand wrap last with the 12 cm wrap, and also had difficulty with the finger wraps. We spent time verbally reviewing as I was bandaging. She preferred to have MLD today instead of practicing. I gave her the MD Loni Dolly, and some illustrated instructions. She may need to try bandaging herself next visit.  OBJECTIVE IMPAIRMENTS: decreased knowledge of condition, decreased knowledge of use of DME, increased edema, and postural dysfunction.   ACTIVITY LIMITATIONS: can do all but with some fatigue in arm  PARTICIPATION LIMITATIONS:  takes part in most activities without limitation but avoids heavy lifting  PERSONAL FACTORS: 3+ comorbidities: bilateral Mastectomies with Right SLNB, chemo and radiation  are also affecting patient's functional outcome.   REHAB POTENTIAL: Good  CLINICAL DECISION MAKING: Stable/uncomplicated  EVALUATION COMPLEXITY: Low  GOALS: Goals reviewed with patient? Yes  SHORT TERM GOALS: Target date: 12/14/2022  Pt will have decreased edema reduction at 10 cm prox to ulna by 2 cm Baseline:31.5 Goal status: INITIAL  2.  Pt will have edema reduction by 1.5 cm at 10 cm prox to olecranon Baseline: 37.2 Goal status: INITIAL  3.  Pt will be compliant  and tolerant of compression bandaging Baseline:  Goal status: INITIAL      LONG TERM GOALS: Target date: 01/04/2023  Pt will be independent with MLD to assist with decreasing edema Baseline:  Goal status: INITIAL  2.  Pt will have edema reduction by 4 cm at 10 cm prox to ulna Baseline: 31.2 Goal status: INITIAL  3.  Pt will be fit for compression garments and will be independent in their use. Baseline:  Goal status: INITIAL  4.  Pt will have edema reduction at Olecranon by 3 cm Baseline: 32 Goal status: INITIAL PLAN:  PT FREQUENCY: 3x/week  PT DURATION: 6 weeks  PLANNED INTERVENTIONS: Therapeutic exercises, Therapeutic activity, Patient/Family education,  Self Care, Orthotic/Fit training, Manual lymph drainage, Compression bandaging, Vasopneumatic device, Manual therapy, and Re-evaluation  PLAN FOR NEXT SESSION:  Check pts wrap, have practice bandaging if needed ,MLD, compression bandaging,  (night garment is, tribute without and power sleeve),     Claris Pong, PT 12/02/2022, 12:21 PM

## 2022-12-02 NOTE — Patient Instructions (Signed)
Self bandaging the arm:   Follow along with this video:  [employer]  -OR-  Search in your internet browser: "self bandaging arm MD [place]"   And the video should appear in the video search results "Lymphedema Management: Self bandaging your arm" on YouTube   This video is for caregiver assistance:  [employer]com  -OR-  Enter " Lymphedema Management: Caregiver Bandaging you arm" into your search browser and your video should appear in the results  [place]. Should your bandages become uncomfortable or feel too tight, follow these steps: Elevate your extremity higher than your heart.  Try to move your arm or leg joints against the firmness of the bandage to help with moving the fluid and allow the bandages to loosen a bit.  If the bandaging is still is too tight, it is ok to carefully remove the top layer.  There will still be more layers under it that can provide compression to your extremity. Finally, if you STILL have significant pain after trying these steps, it is ok to take the bandage off.  Check your skin carefully for any signs of irritation  PLEASE bring ALL bandage materials back to your next appointment as we will reuse what we can TAKE CARE OF YOUR BANDAGES SO THEY WILL LAST LONGER AND STAY IN BETTER CONDITION Washing bandages:  Wash periodically using a mild detergent in warm water.  Do not use fabric softener or bleach.  Place bandages in a mesh lingerie bag or in a tied off pillow case and use the gentle cycle of the washing machine or hand wash. If you hand wash, you may want to put them in the spin cycle of your washer to get the extra water out, but make sure you put them in a mesh bag first. Do not wring or stretch them while they are wet.  Drying bandages: Lay the bandages out smoothly on a towel away from direct sunlight or  heating sources that can damage the fabric. Rolling bandages in a towel and gently squeezing the towel to remove excess water before laying them out can speed up the process.  If you use a drying rack, place a towel on top of the rack to lay the bandages on.  If they hang down to dry, they fabric could be stretched out and the bandage will lose its compression.   Or, keep bandages in the mesh bag and dry them in the dryer on the low or no heat cycle. Rolling bandages: Please roll your bandages after drying them so they are ready for your next treatment. If they are rolled too loose, they will be difficult to apply.  If rolled too tight, they can get stretched out.   TAKE CARE OF YOUR SKIN Apply a low pH moisturizing lotion to your skin daily Avoid scratching your skin Treat skin irritations quickly  Know the 5 warning signs of infection: redness, pain, warmth to touch, fever and increased swelling.  Call your physician immediately if you notice any of these signs of a possible infection.

## 2022-12-05 ENCOUNTER — Encounter: Payer: Self-pay | Admitting: Rehabilitation

## 2022-12-05 ENCOUNTER — Ambulatory Visit: Payer: 59 | Admitting: Rehabilitation

## 2022-12-05 DIAGNOSIS — C50411 Malignant neoplasm of upper-outer quadrant of right female breast: Secondary | ICD-10-CM

## 2022-12-05 DIAGNOSIS — I890 Lymphedema, not elsewhere classified: Secondary | ICD-10-CM | POA: Diagnosis not present

## 2022-12-05 DIAGNOSIS — Z171 Estrogen receptor negative status [ER-]: Secondary | ICD-10-CM

## 2022-12-05 NOTE — Therapy (Signed)
OUTPATIENT PHYSICAL THERAPY ONCOLOGY TREATMENT  Patient Name: Kara Crawford MRN: [id number] DOB:[birth year], 57 y.o., female Today's Date: 12/05/2022  END OF SESSION:  PT End of Session - 12/05/22 1105     Visit Number 5    Number of Visits 18    Date for PT Re-Evaluation 01/04/23    PT Start Time 1106    PT Stop Time 1202    PT Time Calculation (min) 56 min    Activity Tolerance Patient tolerated treatment well    Behavior During Therapy WFL for tasks assessed/performed             Past Medical History:  Diagnosis Date   Anemia    anemia prior to uterine emboliztion procedure.   Breast cancer ([place])    Diabetes mellitus without complication ([place])    type 2   Family history of breast cancer    Family history of ovarian cancer    Family history of prostate cancer    Fibroids    Headache    Heart murmur    History of radiation therapy 03/07/18- 04/18/18   Right Chest wall 50 Gy in 25 fractions, Right supraclavicular fossa 50 Gy in 25 fractions, Right Chest wall scar boost 10 Gy in 5 fractions.    Hyperlipidemia    Hypertension    Past Surgical History:  Procedure Laterality Date   BREAST SURGERY     double mastectomy   BREATH TEK H PYLORI N/A 04/17/2014   Procedure: BREATH TEK H PYLORI;  Surgeon: Shann Medal, MD;  Location: [facility];  Service: General;  Laterality: N/A;   CESAREAN SECTION     x3- normal development   CYSTOSCOPY N/A 07/18/2018   Procedure: CYSTOSCOPY;  Surgeon: Christophe Louis, MD;  Location: [facility];  Service: Gynecology;  Laterality: N/A;   ESOPHAGOGASTRODUODENOSCOPY N/A 08/20/2015   Procedure: ESOPHAGOGASTRODUODENOSCOPY (EGD);  Surgeon: Alphonsa Overall, MD;  Location: [facility];  Service: General;  Laterality: N/A;   EXPLORATORY LAPAROTOMY  05/13/2015   GASTRIC ROUX-EN-Y N/A 10/06/2014   Procedure: LAPAROSCOPIC ROUX-EN-Y GASTRIC BYPASS WITH UPPER ENDOSCOPY;  Surgeon: Pedro Earls, MD;  Location: [facility];  Service: General;  Laterality: N/A;    LAPAROSCOPIC SALPINGO OOPHERECTOMY Bilateral 07/18/2018   Procedure: LAPAROSCOPIC SALPINGO OOPHORECTOMY;  Surgeon: Christophe Louis, MD;  Location: [facility];  Service: Gynecology;  Laterality: Bilateral;  possible laparotomy with abdominal bilateral salpingoophorectomy   LAPAROSCOPY N/A 05/12/2015   Procedure: LAPAROSCOPY REPAIR OF PERFORATED BOWEL;  Surgeon: Alphonsa Overall, MD;  Location: [facility];  Service: General;  Laterality: N/A;   MASTECTOMY WITH RADIOACTIVE SEED GUIDED EXCISION AND AXILLARY SENTINEL LYMPH NODE BIOPSY Bilateral 01/22/2018   Procedure: BILATERAL MASTECTOMIES WITH RADIOACTIVE SEED TARGETED RIGHT AXILLARY [place] NODE EXCISION AND RIGHT SENTINEL LYMPH NODE BIOPSY;  Surgeon: Alphonsa Overall, MD;  Location: [facility];  Service: General;  Laterality: Bilateral;   PORT-A-CATH REMOVAL Left 01/22/2018   Procedure: REMOVAL PORT-A-CATH;  Surgeon: Alphonsa Overall, MD;  Location: [facility];  Service: General;  Laterality: Left;   PORTACATH PLACEMENT N/A 08/02/2017   Procedure: INSERTION PORT-A-CATH;  Surgeon: Alphonsa Overall, MD;  Location: [facility];  Service: General;  Laterality: N/A;   UTERINE ARTERY EMBOLIZATION  08-16-2010   Patient Active Problem List   Diagnosis Date Noted   Cancer of right breast, stage 2 ([place]) 01/22/2018   Genetic testing 08/18/2017   BRCA2 positive 08/18/2017   Family history of breast cancer    Family history of ovarian cancer    Family  history of prostate cancer    Malignant neoplasm of upper-outer quadrant of right breast in female, estrogen receptor negative ([place]) 07/25/2017   Perforated bowel ([place]) 05/13/2015   Free intraperitoneal air 05/12/2015   Submucosal lesion of esophagus 10/06/2014   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   S/P gastric bypass 10/06/2014   Diabetes ([place]) 09/24/2014   Morbid obesity ([place]) 03/20/2014    PCP: Theadore Nan, MD  REFERRING PROVIDER: Nicholas Lose, MD  REFERRING DIAG: Right UE lymphedema  THERAPY DIAG:  Lymphedema  of arm  Malignant neoplasm of upper-outer quadrant of right breast in female, estrogen receptor negative ([place])  ONSET DATE: 2019 with recent exacerbation  Rationale for Evaluation and Treatment: Rehabilitation  SUBJECTIVE:                                                                                                                                                                                           SUBJECTIVE STATEMENT: Nothing new   PERTINENT HISTORY:  Patient was diagnosed on 07/13/17 with 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  01/22/18- underwent bilateral mastectomy and a SLNB on the right side with 0/6 positive LN's , Pt  completed chemotherapy and radiation. She has been seen here previously for lymphedema and shoulder ROM.  She has a night garment that she does not feel fits right. (She brought in [place] without velcro;should fit when she reduces.  PAIN:  Are you having pain? No NPRS scale: 0/10  PRECAUTIONS: Other: Right UE lymphedema  WEIGHT BEARING RESTRICTIONS: No  FALLS:  Has patient fallen in last 6 months? No  LIVING ENVIRONMENT: Lives with: lives with their spouse, and 2 grown kids. Lives in: House/apartment Stairs: Yes; Internal: 14 steps; on left going up and External: 4 steps; can reach both Has following equipment at home: None  OCCUPATION: NONE  LEISURE: spend time with family, work out,church,read,make soaps  HAND DOMINANCE: right   PRIOR LEVEL OF FUNCTION: Independent  PATIENT GOALS: Decrease right UE sweling   OBJECTIVE:  COGNITION: Overall cognitive status: Within functional limits for tasks assessed   PALPATION:   OBSERVATIONS / OTHER ASSESSMENTS: significant swelling right UE, mild inferior right axillary swelling  SENSATION: Light touch: Appears intact POSTURE: forward head, rounded shoulders  UPPER EXTREMITY AROM/PROM: WFL   CERVICAL AROM: All within functional:  UPPER EXTREMITY  STRENGTH: WNL  LYMPHEDEMA ASSESSMENTS:   SURGERY TYPE/DATE: Bilateral Mastectomies with Right SLNB  NUMBER OF LYMPH NODES REMOVED: 0/6  CHEMOTHERAPY: YES  RADIATION:YES  HORMONE TREATMENT:   INFECTIONS: no  LYMPHEDEMA ASSESSMENTS:   LANDMARK RIGHT  eval RIGHT 11/28/22  35.4 36.2  10 cm proximal to olecranon process 37.2 35.5  Olecranon process 32.0 30.5  10 cm proximal to ulnar styloid process 31.5 29.2  Just proximal to ulnar styloid process 21.7 21.1  Across hand at thumb web space 21.0 20.2  At base of 2nd digit 6.85 6.5  (Blank rows = not tested)  LANDMARK LEFT  eval  axilla 34.5  10 cm proximal to olecranon process 32.8  Olecranon process 26.4  10 cm proximal to ulnar styloid process 23.8  Just proximal to ulnar styloid process 18.0  Across hand at thumb web space 19.2  At base of 2nd digit 6.2  (Blank rows = not tested)   Circumference of chest 97.8  Lymphedema Life Impact Scale: 16.18   TODAY'S TREATMENT:                                                                                                                                          DATE:  12/05/2022 Pt performed bandaging x 1 with cueing and changes as needed.  Needed some vcs for fingers and overlap on bandages.   In supine: Short neck, superficial and deep abdominals, L axillary nodes and establishment of interaxillary pathway, R inguinal nodes and establishment of axilloinguinal pathway, then R UE working proximal to distal, moving inner upper arm outwards and upwards, and doing both sides of forearm and hand, spending extra time in any areas of fibrosis then retracing all steps  Compression bandaging as follows: (pt had already applied lotion, donned TG soft from hand to axilla,  mollelast to digits 1-5with TG soft pulled over it,artiflex hand to axilla,1 6cm bandage at hand, 1 8 cm bandage from wrist to axilla with x at antecubital fossa then 1 12 cm bandage from wrist to axilla in  spiral.  12/02/2022 Wraps removed by PT; pt wrapped hand with largest wrap but did last and did not cover the knuckles. It was also very loose. Finger wraps were spaced very wide apart with a lot of wrap at the wrist. She used the smallest wrap  on the arm from wrist to axilla. Showed pt how to use the table to hold her wrap. She felt she could do the wrap better by watching me closely today. In supine: Short neck, superficial and deep abdominals, L axillary nodes and establishment of interaxillary pathway, R inguinal nodes and establishment of axilloinguinal pathway, then R UE working proximal to distal, moving inner upper arm outwards and upwards, and doing both sides of forearm and hand, spending extra time in any areas of fibrosis then retracing all steps  Compression bandaging as follows: (pt had already applied lotion, donned TG soft from hand to axilla,  mollelast to digits 1-5with TG soft pulled over it,artiflex hand to axilla,1 6cm bandage at hand, 1 8 cm bandage from wrist to axilla with x at antecubital fossa then 1 12 cm bandage  from wrist to axilla in spiral. Verbalized to pt as I was doing it each step and gave pt written and illustrated handouts and link to [employer] video.  11/30/2022 Pt donned tribute night with power sleeve. Slightly difficult to get on but was able to get all the way up In supine: Short neck, 5 diaphragmatic breaths, L axillary nodes and establishment of interaxillary pathway, R inguinal nodes and establishment of axilloinguinal pathway, then R UE working proximal to distal, moving inner upper arm outwards and upwards, and doing both sides of forearms, spending extra time in any areas of fibrosis then retracing all steps   Compression bandaging as follows: applied lotion to RUE then donned TG soft from hand to axilla, artiflex from hand to axilla, mollelast to digits 1-5, 1 6cm bandage at hand, 1 8 cm bandage from wrist to axilla with x at antecubital fossa then 1 12 cm  bandage from wrist to axilla in spiral.   11/23/2022: In supine: Short neck, 5 diaphragmatic breaths, L axillary nodes and establishment of interaxillary pathway, R inguinal nodes and establishment of axilloinguinal pathway, then R UE working proximal to distal, moving inner upper arm outwards and upwards, and doing both sides of forearms, spending extra time in any areas of fibrosis then retracing all steps   Compression bandaging as follows: applied lotion to RUE then donned TG soft from hand to axilla, artiflex from hand to axilla, mollelast to digits 1-5, 1 6cm bandage at hand, 1 8 cm bandage from wrist to axilla with x at antecubital fossa then 1 12 cm bandage from wrist to axilla in herringbone fashion   PATIENT EDUCATION:  Education details: circaid profile compression garment for night time use, fine to exercise in bandaging Person educated: Patient Education method: Demonstration Education comprehension: verbalized understanding  HOME EXERCISE PROGRAM: None given today  ASSESSMENT:  CLINICAL IMPRESSION: Practiced self bandaging today which was performed moderately well. Needing min/mod vcs for overlap and fingers.  Pt would like to be measured on Wednesday for her new sleeve and glove as she reports her arm is about at baseline.    OBJECTIVE IMPAIRMENTS: decreased knowledge of condition, decreased knowledge of use of DME, increased edema, and postural dysfunction.   ACTIVITY LIMITATIONS: can do all but with some fatigue in arm  PARTICIPATION LIMITATIONS:  takes part in most activities without limitation but avoids heavy lifting  PERSONAL FACTORS: 3+ comorbidities: bilateral Mastectomies with Right SLNB, chemo and radiation  are also affecting patient's functional outcome.   REHAB POTENTIAL: Good  CLINICAL DECISION MAKING: Stable/uncomplicated  EVALUATION COMPLEXITY: Low  GOALS: Goals reviewed with patient? Yes  SHORT TERM GOALS: Target date: 12/14/2022  Pt will have  decreased edema reduction at 10 cm prox to ulna by 2 cm Baseline:31.5 Goal status: INITIAL  2.  Pt will have edema reduction by 1.5 cm at 10 cm prox to olecranon Baseline: 37.2 Goal status: INITIAL  3.  Pt will be compliant and tolerant of compression bandaging Baseline:  Goal status: INITIAL      LONG TERM GOALS: Target date: 01/04/2023  Pt will be independent with MLD to assist with decreasing edema Baseline:  Goal status: INITIAL  2.  Pt will have edema reduction by 4 cm at 10 cm prox to ulna Baseline: 31.2 Goal status: INITIAL  3.  Pt will be fit for compression garments and will be independent in their use. Baseline:  Goal status: INITIAL  4.  Pt will have edema reduction at Olecranon by  3 cm Baseline: 32 Goal status: INITIAL PLAN:  PT FREQUENCY: 3x/week  PT DURATION: 6 weeks  PLANNED INTERVENTIONS: Therapeutic exercises, Therapeutic activity, Patient/Family education, Self Care, Orthotic/Fit training, Manual lymph drainage, Compression bandaging, Vasopneumatic device, Manual therapy, and Re-evaluation  PLAN FOR NEXT SESSION:  Remeasure, verfiy she wants to get measured for custom that afternoon or if she wants to wait any more.  practice bandaging if needed ,MLD, compression bandaging,  (night garment is, tribute without and power sleeve),     Stark Bray, PT 12/05/2022, 12:46 PM

## 2022-12-08 ENCOUNTER — Ambulatory Visit: Payer: 59 | Admitting: Physical Therapy

## 2022-12-08 ENCOUNTER — Encounter: Payer: Self-pay | Admitting: Physical Therapy

## 2022-12-08 DIAGNOSIS — Z171 Estrogen receptor negative status [ER-]: Secondary | ICD-10-CM

## 2022-12-08 DIAGNOSIS — I890 Lymphedema, not elsewhere classified: Secondary | ICD-10-CM

## 2022-12-08 NOTE — Therapy (Signed)
OUTPATIENT PHYSICAL THERAPY ONCOLOGY TREATMENT  Patient Name: Kara Crawford MRN: [id number] DOB:[birth year], 57 y.o., female Today's Date: 12/08/2022  END OF SESSION:  PT End of Session - 12/08/22 0908     Visit Number 6    Number of Visits 18    Date for PT Re-Evaluation 01/04/23    PT Start Time 0906    PT Stop Time 1002    PT Time Calculation (min) 56 min    Activity Tolerance Patient tolerated treatment well    Behavior During Therapy [facility] for tasks assessed/performed             Past Medical History:  Diagnosis Date   Anemia    anemia prior to uterine emboliztion procedure.   Breast cancer ([place])    Diabetes mellitus without complication ([place])    type 2   Family history of breast cancer    Family history of ovarian cancer    Family history of prostate cancer    Fibroids    Headache    Heart murmur    History of radiation therapy 03/07/18- 04/18/18   Right Chest wall 50 Gy in 25 fractions, Right supraclavicular fossa 50 Gy in 25 fractions, Right Chest wall scar boost 10 Gy in 5 fractions.    Hyperlipidemia    Hypertension    Past Surgical History:  Procedure Laterality Date   BREAST SURGERY     double mastectomy   BREATH TEK H PYLORI N/A 04/17/2014   Procedure: BREATH TEK H PYLORI;  Surgeon: Shann Medal, MD;  Location: [facility];  Service: General;  Laterality: N/A;   CESAREAN SECTION     x3- normal development   CYSTOSCOPY N/A 07/18/2018   Procedure: CYSTOSCOPY;  Surgeon: Christophe Louis, MD;  Location: [facility];  Service: Gynecology;  Laterality: N/A;   ESOPHAGOGASTRODUODENOSCOPY N/A 08/20/2015   Procedure: ESOPHAGOGASTRODUODENOSCOPY (EGD);  Surgeon: Alphonsa Overall, MD;  Location: [facility];  Service: General;  Laterality: N/A;   EXPLORATORY LAPAROTOMY  05/13/2015   GASTRIC ROUX-EN-Y N/A 10/06/2014   Procedure: LAPAROSCOPIC ROUX-EN-Y GASTRIC BYPASS WITH UPPER ENDOSCOPY;  Surgeon: Pedro Earls, MD;  Location: [facility];  Service: General;  Laterality: N/A;    LAPAROSCOPIC SALPINGO OOPHERECTOMY Bilateral 07/18/2018   Procedure: LAPAROSCOPIC SALPINGO OOPHORECTOMY;  Surgeon: Christophe Louis, MD;  Location: [facility];  Service: Gynecology;  Laterality: Bilateral;  possible laparotomy with abdominal bilateral salpingoophorectomy   LAPAROSCOPY N/A 05/12/2015   Procedure: LAPAROSCOPY REPAIR OF PERFORATED BOWEL;  Surgeon: Alphonsa Overall, MD;  Location: [facility];  Service: General;  Laterality: N/A;   MASTECTOMY WITH RADIOACTIVE SEED GUIDED EXCISION AND AXILLARY SENTINEL LYMPH NODE BIOPSY Bilateral 01/22/2018   Procedure: BILATERAL MASTECTOMIES WITH RADIOACTIVE SEED TARGETED RIGHT AXILLARY [place] NODE EXCISION AND RIGHT SENTINEL LYMPH NODE BIOPSY;  Surgeon: Alphonsa Overall, MD;  Location: [facility];  Service: General;  Laterality: Bilateral;   PORT-A-CATH REMOVAL Left 01/22/2018   Procedure: REMOVAL PORT-A-CATH;  Surgeon: Alphonsa Overall, MD;  Location: [facility];  Service: General;  Laterality: Left;   PORTACATH PLACEMENT N/A 08/02/2017   Procedure: INSERTION PORT-A-CATH;  Surgeon: Alphonsa Overall, MD;  Location: [facility];  Service: General;  Laterality: N/A;   UTERINE ARTERY EMBOLIZATION  08-16-2010   Patient Active Problem List   Diagnosis Date Noted   Cancer of right breast, stage 2 ([place]) 01/22/2018   Genetic testing 08/18/2017   BRCA2 positive 08/18/2017   Family history of breast cancer    Family history of ovarian cancer    Family  history of prostate cancer    Malignant neoplasm of upper-outer quadrant of right breast in female, estrogen receptor negative ([place]) 07/25/2017   Perforated bowel ([place]) 05/13/2015   Free intraperitoneal air 05/12/2015   Submucosal lesion of esophagus 10/06/2014   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   S/P gastric bypass 10/06/2014   Diabetes ([place]) 09/24/2014   Morbid obesity ([place]) 03/20/2014    PCP: Theadore Nan, MD  REFERRING PROVIDER: Nicholas Lose, MD  REFERRING DIAG: Right UE lymphedema  THERAPY DIAG:  Lymphedema  of arm  Malignant neoplasm of upper-outer quadrant of right breast in female, estrogen receptor negative ([place])  ONSET DATE: 2019 with recent exacerbation  Rationale for Evaluation and Treatment: Rehabilitation  SUBJECTIVE:                                                                                                                                                                                           SUBJECTIVE STATEMENT: I came in yesterday to get measured.   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  01/22/18- underwent bilateral mastectomy and a SLNB on the right side with 0/6 positive LN's , Pt  completed chemotherapy and radiation. She has been seen here previously for lymphedema and shoulder ROM.  She has a night garment that she does not feel fits right. (She brought in [place] without velcro;should fit when she reduces.  PAIN:  Are you having pain? No NPRS scale: 0/10  PRECAUTIONS: Other: Right UE lymphedema  WEIGHT BEARING RESTRICTIONS: No  FALLS:  Has patient fallen in last 6 months? No  LIVING ENVIRONMENT: Lives with: lives with their spouse, and 2 grown kids. Lives in: House/apartment Stairs: Yes; Internal: 14 steps; on left going up and External: 4 steps; can reach both Has following equipment at home: None  OCCUPATION: NONE  LEISURE: spend time with family, work out,church,read,make soaps  HAND DOMINANCE: right   PRIOR LEVEL OF FUNCTION: Independent  PATIENT GOALS: Decrease right UE sweling   OBJECTIVE:  COGNITION: Overall cognitive status: Within functional limits for tasks assessed   PALPATION:   OBSERVATIONS / OTHER ASSESSMENTS: significant swelling right UE, mild inferior right axillary swelling  SENSATION: Light touch: Appears intact POSTURE: forward head, rounded shoulders  UPPER EXTREMITY AROM/PROM: WFL   CERVICAL AROM: All within  functional:  UPPER EXTREMITY STRENGTH: WNL  LYMPHEDEMA ASSESSMENTS:   SURGERY TYPE/DATE: Bilateral Mastectomies with Right SLNB  NUMBER OF LYMPH NODES REMOVED: 0/6  CHEMOTHERAPY: YES  RADIATION:YES  HORMONE TREATMENT:   INFECTIONS: no  LYMPHEDEMA ASSESSMENTS:   LANDMARK RIGHT  eval RIGHT 11/28/22   35.4 36.2  10 cm proximal to olecranon process 37.2 35.5  Olecranon process 32.0 30.5  10 cm proximal to ulnar styloid process 31.5 29.2  Just proximal to ulnar styloid process 21.7 21.1  Across hand at thumb web space 21.0 20.2  At base of 2nd digit 6.85 6.5  (Blank rows = not tested)  LANDMARK LEFT  eval  axilla 34.5  10 cm proximal to olecranon process 32.8  Olecranon process 26.4  10 cm proximal to ulnar styloid process 23.8  Just proximal to ulnar styloid process 18.0  Across hand at thumb web space 19.2  At base of 2nd digit 6.2  (Blank rows = not tested)   Circumference of chest 97.8  Lymphedema Life Impact Scale: 16.18   TODAY'S TREATMENT:                                                                                                                                          DATE:  12/08/2022 In supine: Short neck, superficial and deep abdominals, L axillary nodes and establishment of interaxillary pathway, R inguinal nodes and establishment of axilloinguinal pathway, then R UE working proximal to distal, moving inner upper arm outwards and upwards, and doing both sides of forearm and hand, spending extra time in any areas of fibrosis then retracing all steps  Compression bandaging as follows: lotion, TG soft from hand to axilla,  mollelast to digits 1-5with TG soft pulled over it,artiflex hand to axilla,1 6cm bandage at hand, 1 8 cm bandage from wrist to axilla in herringbone fashion then 1 12 cm bandage from wrist to axilla in spiral.  12/05/2022 Pt performed bandaging x 1 with cueing and changes as needed.  Needed some vcs for fingers and overlap on bandages.    In supine: Short neck, superficial and deep abdominals, L axillary nodes and establishment of interaxillary pathway, R inguinal nodes and establishment of axilloinguinal pathway, then R UE working proximal to distal, moving inner upper arm outwards and upwards, and doing both sides of forearm and hand, spending extra time in any areas of fibrosis then retracing all steps  Compression bandaging as follows: (pt had already applied lotion, donned TG soft from hand to axilla,  mollelast to digits 1-5with TG soft pulled over it,artiflex hand to axilla,1 6cm bandage at hand, 1 8 cm bandage from wrist to axilla with x at antecubital fossa then 1 12 cm bandage from wrist to axilla in spiral.  12/02/2022 Wraps removed by PT; pt wrapped hand with largest wrap but did last and did not cover the knuckles. It was also very loose. Finger wraps were spaced very wide apart with a lot of wrap at the wrist. She used the smallest wrap  on the arm from wrist to axilla. Showed pt how to use the table to hold her wrap. She felt she could do the wrap  better by watching me closely today. In supine: Short neck, superficial and deep abdominals, L axillary nodes and establishment of interaxillary pathway, R inguinal nodes and establishment of axilloinguinal pathway, then R UE working proximal to distal, moving inner upper arm outwards and upwards, and doing both sides of forearm and hand, spending extra time in any areas of fibrosis then retracing all steps  Compression bandaging as follows: (pt had already applied lotion, donned TG soft from hand to axilla,  mollelast to digits 1-5with TG soft pulled over it,artiflex hand to axilla,1 6cm bandage at hand, 1 8 cm bandage from wrist to axilla with x at antecubital fossa then 1 12 cm bandage from wrist to axilla in spiral. Verbalized to pt as I was doing it each step and gave pt written and illustrated handouts and link to MD [employer].  11/30/2022 Pt donned tribute night with  power sleeve. Slightly difficult to get on but was able to get all the way up In supine: Short neck, 5 diaphragmatic breaths, L axillary nodes and establishment of interaxillary pathway, R inguinal nodes and establishment of axilloinguinal pathway, then R UE working proximal to distal, moving inner upper arm outwards and upwards, and doing both sides of forearms, spending extra time in any areas of fibrosis then retracing all steps   Compression bandaging as follows: applied lotion to RUE then donned TG soft from hand to axilla, artiflex from hand to axilla, mollelast to digits 1-5, 1 6cm bandage at hand, 1 8 cm bandage from wrist to axilla with x at antecubital fossa then 1 12 cm bandage from wrist to axilla in spiral.   11/23/2022: In supine: Short neck, 5 diaphragmatic breaths, L axillary nodes and establishment of interaxillary pathway, R inguinal nodes and establishment of axilloinguinal pathway, then R UE working proximal to distal, moving inner upper arm outwards and upwards, and doing both sides of forearms, spending extra time in any areas of fibrosis then retracing all steps   Compression bandaging as follows: applied lotion to RUE then donned TG soft from hand to axilla, artiflex from hand to axilla, mollelast to digits 1-5, 1 6cm bandage at hand, 1 8 cm bandage from wrist to axilla with x at antecubital fossa then 1 12 cm bandage from wrist to axilla in herringbone fashion   PATIENT EDUCATION:  Education details: circaid profile compression garment for night time use, fine to exercise in bandaging Person educated: Patient Education method: Demonstration Education comprehension: verbalized understanding  HOME EXERCISE PROGRAM: None given today  ASSESSMENT:  CLINICAL IMPRESSION: Continued with MLD and compression bandaging today. Pt was measured for compression sleeve and glove yesterday. She plans to continue to use her old night time garment.   OBJECTIVE IMPAIRMENTS: decreased  knowledge of condition, decreased knowledge of use of DME, increased edema, and postural dysfunction.   ACTIVITY LIMITATIONS: can do all but with some fatigue in arm  PARTICIPATION LIMITATIONS:  takes part in most activities without limitation but avoids heavy lifting  PERSONAL FACTORS: 3+ comorbidities: bilateral Mastectomies with Right SLNB, chemo and radiation  are also affecting patient's functional outcome.   REHAB POTENTIAL: Good  CLINICAL DECISION MAKING: Stable/uncomplicated  EVALUATION COMPLEXITY: Low  GOALS: Goals reviewed with patient? Yes  SHORT TERM GOALS: Target date: 12/14/2022  Pt will have decreased edema reduction at 10 cm prox to ulna by 2 cm Baseline:31.5 Goal status: INITIAL  2.  Pt will have edema reduction by 1.5 cm at 10 cm prox to olecranon Baseline: 37.2 Goal  status: INITIAL  3.  Pt will be compliant and tolerant of compression bandaging Baseline:  Goal status: INITIAL      LONG TERM GOALS: Target date: 01/04/2023  Pt will be independent with MLD to assist with decreasing edema Baseline:  Goal status: INITIAL  2.  Pt will have edema reduction by 4 cm at 10 cm prox to ulna Baseline: 31.2 Goal status: INITIAL  3.  Pt will be fit for compression garments and will be independent in their use. Baseline:  Goal status: INITIAL  4.  Pt will have edema reduction at Olecranon by 3 cm Baseline: 32 Goal status: INITIAL PLAN:  PT FREQUENCY: 3x/week  PT DURATION: 6 weeks  PLANNED INTERVENTIONS: Therapeutic exercises, Therapeutic activity, Patient/Family education, Self Care, Orthotic/Fit training, Manual lymph drainage, Compression bandaging, Vasopneumatic device, Manual therapy, and Re-evaluation  PLAN FOR NEXT SESSION:  Remeasure, practice bandaging if needed ,MLD, compression bandaging,  (night garment is, tribute without and power sleeve),     [employer], PT 12/08/2022, 11:02 AM

## 2022-12-09 ENCOUNTER — Encounter: Payer: 59 | Admitting: Physical Therapy

## 2022-12-12 ENCOUNTER — Encounter: Payer: Self-pay | Admitting: Physical Therapy

## 2022-12-12 ENCOUNTER — Ambulatory Visit: Payer: 59 | Admitting: Physical Therapy

## 2022-12-12 DIAGNOSIS — I890 Lymphedema, not elsewhere classified: Secondary | ICD-10-CM

## 2022-12-12 DIAGNOSIS — Z171 Estrogen receptor negative status [ER-]: Secondary | ICD-10-CM

## 2022-12-12 NOTE — Therapy (Signed)
OUTPATIENT PHYSICAL THERAPY ONCOLOGY TREATMENT  Patient Name: Kara Crawford MRN: [id number] DOB:02-Jun-1966, 57 y.o., female Today's Date: 12/12/2022  END OF SESSION:  PT End of Session - 12/12/22 1006     Visit Number 7    Number of Visits 18    Date for PT Re-Evaluation 01/04/23    PT Start Time 1007    PT Stop Time 1110    PT Time Calculation (min) 63 min    Activity Tolerance Patient tolerated treatment well    Behavior During Therapy WFL for tasks assessed/performed              Past Medical History:  Diagnosis Date   Anemia    anemia prior to uterine emboliztion procedure.   Breast cancer ([place])    Diabetes mellitus without complication ([place])    type 2   Family history of breast cancer    Family history of ovarian cancer    Family history of prostate cancer    Fibroids    Headache    Heart murmur    History of radiation therapy 03/07/18- 04/18/18   Right Chest wall 50 Gy in 25 fractions, Right supraclavicular fossa 50 Gy in 25 fractions, Right Chest wall scar boost 10 Gy in 5 fractions.    Hyperlipidemia    Hypertension    Past Surgical History:  Procedure Laterality Date   BREAST SURGERY     double mastectomy   BREATH TEK H PYLORI N/A 04/17/2014   Procedure: BREATH TEK H PYLORI;  Surgeon: Shann Medal, MD;  Location: [facility];  Service: General;  Laterality: N/A;   CESAREAN SECTION     x3- normal development   CYSTOSCOPY N/A 07/18/2018   Procedure: CYSTOSCOPY;  Surgeon: Christophe Louis, MD;  Location: [facility];  Service: Gynecology;  Laterality: N/A;   ESOPHAGOGASTRODUODENOSCOPY N/A 08/20/2015   Procedure: ESOPHAGOGASTRODUODENOSCOPY (EGD);  Surgeon: Alphonsa Overall, MD;  Location: [facility];  Service: General;  Laterality: N/A;   EXPLORATORY LAPAROTOMY  05/13/2015   GASTRIC ROUX-EN-Y N/A 10/06/2014   Procedure: LAPAROSCOPIC ROUX-EN-Y GASTRIC BYPASS WITH UPPER ENDOSCOPY;  Surgeon: Pedro Earls, MD;  Location: [facility];  Service: General;  Laterality: N/A;    LAPAROSCOPIC SALPINGO OOPHERECTOMY Bilateral 07/18/2018   Procedure: LAPAROSCOPIC SALPINGO OOPHORECTOMY;  Surgeon: Christophe Louis, MD;  Location: [facility];  Service: Gynecology;  Laterality: Bilateral;  possible laparotomy with abdominal bilateral salpingoophorectomy   LAPAROSCOPY N/A 05/12/2015   Procedure: LAPAROSCOPY REPAIR OF PERFORATED BOWEL;  Surgeon: Alphonsa Overall, MD;  Location: [facility];  Service: General;  Laterality: N/A;   MASTECTOMY WITH RADIOACTIVE SEED GUIDED EXCISION AND AXILLARY SENTINEL LYMPH NODE BIOPSY Bilateral 01/22/2018   Procedure: BILATERAL MASTECTOMIES WITH RADIOACTIVE SEED TARGETED RIGHT AXILLARY [place] NODE EXCISION AND RIGHT SENTINEL LYMPH NODE BIOPSY;  Surgeon: Alphonsa Overall, MD;  Location: [facility];  Service: General;  Laterality: Bilateral;   PORT-A-CATH REMOVAL Left 01/22/2018   Procedure: REMOVAL PORT-A-CATH;  Surgeon: Alphonsa Overall, MD;  Location: [facility];  Service: General;  Laterality: Left;   PORTACATH PLACEMENT N/A 08/02/2017   Procedure: INSERTION PORT-A-CATH;  Surgeon: Alphonsa Overall, MD;  Location: [facility];  Service: General;  Laterality: N/A;   UTERINE ARTERY EMBOLIZATION  08-16-2010   Patient Active Problem List   Diagnosis Date Noted   Cancer of right breast, stage 2 ([place]) 01/22/2018   Genetic testing 08/18/2017   BRCA2 positive 08/18/2017   Family history of breast cancer    Family history of ovarian cancer  Family history of prostate cancer    Malignant neoplasm of upper-outer quadrant of right breast in female, estrogen receptor negative ([place]) 07/25/2017   Perforated bowel ([place]) 05/13/2015   Free intraperitoneal air 05/12/2015   Submucosal lesion of esophagus 10/06/2014   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   S/P gastric bypass 10/06/2014   Diabetes ([place]) 09/24/2014   Morbid obesity ([place]) 03/20/2014    PCP: Theadore Nan, MD  REFERRING PROVIDER: Nicholas Lose, MD  REFERRING DIAG: Right UE lymphedema  THERAPY DIAG:  Lymphedema  of arm  Malignant neoplasm of upper-outer quadrant of right breast in female, estrogen receptor negative ([place])  ONSET DATE: 2019 with recent exacerbation  Rationale for Evaluation and Treatment: Rehabilitation  SUBJECTIVE:                                                                                                                                                                                           SUBJECTIVE STATEMENT: Pt states she feels that her swelling in her arm is ok.  She took the bandages off last night and took a shower. 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  01/22/18- underwent bilateral mastectomy and a SLNB on the right side with 0/6 positive LN's , Pt  completed chemotherapy and radiation. She has been seen here previously for lymphedema and shoulder ROM.  She has a night garment that she does not feel fits right. (She brought in [place] without velcro;should fit when she reduces.  PAIN:  Are you having pain? No NPRS scale: 0/10  PRECAUTIONS: Other: Right UE lymphedema  WEIGHT BEARING RESTRICTIONS: No  FALLS:  Has patient fallen in last 6 months? No  LIVING ENVIRONMENT: Lives with: lives with their spouse, and 2 grown kids. Lives in: House/apartment Stairs: Yes; Internal: 14 steps; on left going up and External: 4 steps; can reach both Has following equipment at home: None  OCCUPATION: NONE  LEISURE: spend time with family, work out,church,read,make soaps  HAND DOMINANCE: right   PRIOR LEVEL OF FUNCTION: Independent  PATIENT GOALS: Decrease right UE sweling   OBJECTIVE:  COGNITION: Overall cognitive status: Within functional limits for tasks assessed   PALPATION:   OBSERVATIONS / OTHER ASSESSMENTS: significant swelling right UE, mild inferior right axillary swelling  SENSATION: Light touch: Appears  intact POSTURE: forward head, rounded shoulders  UPPER EXTREMITY AROM/PROM: WFL   CERVICAL AROM: All within functional:  UPPER EXTREMITY STRENGTH: WNL  LYMPHEDEMA ASSESSMENTS:   SURGERY TYPE/DATE: Bilateral Mastectomies with Right SLNB  NUMBER OF LYMPH NODES REMOVED: 0/6  CHEMOTHERAPY: YES  RADIATION:YES  HORMONE TREATMENT:   INFECTIONS: no  LYMPHEDEMA ASSESSMENTS:   LANDMARK RIGHT  eval RIGHT 11/28/22   35.4 36.2  10 cm proximal to olecranon process 37.2 35.5  Olecranon process 32.0 30.5  10 cm proximal to ulnar styloid process 31.5 29.2  Just proximal to ulnar styloid process 21.7 21.1  Across hand at thumb web space 21.0 20.2  At base of 2nd digit 6.85 6.5  (Blank rows = not tested)  LANDMARK LEFT  eval  axilla 34.5  10 cm proximal to olecranon process 32.8  Olecranon process 26.4  10 cm proximal to ulnar styloid process 23.8  Just proximal to ulnar styloid process 18.0  Across hand at thumb web space 19.2  At base of 2nd digit 6.2  (Blank rows = not tested)   Circumference of chest 97.8  Lymphedema Life Impact Scale: 16.18   TODAY'S TREATMENT:                                                                                                                                          DATE:  2/12/2023In supine: Short neck, superficial and deep abdominals, L axillary nodes and establishment of interaxillary pathway, R inguinal nodes and establishment of axilloinguinal pathway, then R UE working proximal to distal, moving inner upper arm outwards and upwards, and doing both sides of forearm and hand, spending extra time in any areas of fibrosis then retracing all steps Compression bandaging as follows: lotion, TG soft from hand to axilla,  mollelast to digits except for 5th gfinger with TG soft pulled over it,artiflex hand to axilla,1 6cm bandage at hand, 2 8 cm bandage from wrist to axilla in herringbone fashion then 1 12 cm bandage from wrist to axilla in  spiral.Encouraged pt to keep hand elevated and do forearm pronation and supination in bandages at home  Asked pt to keep bandages on til next visit if possible for measurement that visit   12/08/2022 In supine: Short neck, superficial and deep abdominals, L axillary nodes and establishment of interaxillary pathway, R inguinal nodes and establishment of axilloinguinal pathway, then R UE working proximal to distal, moving inner upper arm outwards and upwards, and doing both sides of forearm and hand, spending extra time in any areas of fibrosis then retracing all steps  Compression bandaging as follows: lotion, TG soft from hand to axilla,  mollelast to digits 1-5with TG soft pulled over it,artiflex hand to axilla,1 6cm bandage at hand, 1 8 cm bandage from wrist to axilla in herringbone fashion then 1 12 cm bandage from wrist to axilla in spiral.  12/05/2022 Pt performed bandaging x 1 with cueing and changes as needed.  Needed some vcs for fingers and overlap on bandages.   In supine: Short neck, superficial and deep abdominals, L axillary nodes and establishment of interaxillary pathway, R  inguinal nodes and establishment of axilloinguinal pathway, then R UE working proximal to distal, moving inner upper arm outwards and upwards, and doing both sides of forearm and hand, spending extra time in any areas of fibrosis then retracing all steps  Compression bandaging as follows: (pt had already applied lotion, donned TG soft from hand to axilla,  mollelast to digits 1-5with TG soft pulled over it,artiflex hand to axilla,1 6cm bandage at hand, 1 8 cm bandage from wrist to axilla with x at antecubital fossa then 1 12 cm bandage from wrist to axilla in spiral.  12/02/2022 Wraps removed by PT; pt wrapped hand with largest wrap but did last and did not cover the knuckles. It was also very loose. Finger wraps were spaced very wide apart with a lot of wrap at the wrist. She used the smallest wrap  on the arm from wrist to  axilla. Showed pt how to use the table to hold her wrap. She felt she could do the wrap better by watching me closely today. In supine: Short neck, superficial and deep abdominals, L axillary nodes and establishment of interaxillary pathway, R inguinal nodes and establishment of axilloinguinal pathway, then R UE working proximal to distal, moving inner upper arm outwards and upwards, and doing both sides of forearm and hand, spending extra time in any areas of fibrosis then retracing all steps  Compression bandaging as follows: (pt had already applied lotion, donned TG soft from hand to axilla,  mollelast to digits 1-5with TG soft pulled over it,artiflex hand to axilla,1 6cm bandage at hand, 1 8 cm bandage from wrist to axilla with x at antecubital fossa then 1 12 cm bandage from wrist to axilla in spiral. Verbalized to pt as I was doing it each step and gave pt written and illustrated handouts and link to MD [employer].  11/30/2022 Pt donned tribute night with power sleeve. Slightly difficult to get on but was able to get all the way up In supine: Short neck, 5 diaphragmatic breaths, L axillary nodes and establishment of interaxillary pathway, R inguinal nodes and establishment of axilloinguinal pathway, then R UE working proximal to distal, moving inner upper arm outwards and upwards, and doing both sides of forearms, spending extra time in any areas of fibrosis then retracing all steps   Compression bandaging as follows: applied lotion to RUE then donned TG soft from hand to axilla, artiflex from hand to axilla, mollelast to digits 1-5, 1 6cm bandage at hand, 1 8 cm bandage from wrist to axilla with x at antecubital fossa then 1 12 cm bandage from wrist to axilla in spiral.   11/23/2022: In supine: Short neck, 5 diaphragmatic breaths, L axillary nodes and establishment of interaxillary pathway, R inguinal nodes and establishment of axilloinguinal pathway, then R UE working proximal to distal, moving  inner upper arm outwards and upwards, and doing both sides of forearms, spending extra time in any areas of fibrosis then retracing all steps   Compression bandaging as follows: applied lotion to RUE then donned TG soft from hand to axilla, artiflex from hand to axilla, mollelast to digits 1-5, 1 6cm bandage at hand, 1 8 cm bandage from wrist to axilla with x at antecubital fossa then 1 12 cm bandage from wrist to axilla in herringbone fashion   PATIENT EDUCATION:  Education details: circaid profile compression garment for night time use, fine to exercise in bandaging Person educated: Patient Education method: Demonstration Education comprehension: verbalized understanding  HOME EXERCISE  PROGRAM: None given today  ASSESSMENT:  CLINICAL IMPRESSION: Continued with MLD and compression bandaging today.Talked with pt about yoga and exercise and gave her information about [facility] wellness classes.  She is interested in Designer, multimedia program and how to progress correctly as she was doing UE with 15 pounds without her sleeve at one point and felt extra heaviness in her arm afterwards.  Added another 8 cm to forearm today for extra compression at forearm and encouraged pt to continue elevation and exrcise  OBJECTIVE IMPAIRMENTS: decreased knowledge of condition, decreased knowledge of use of DME, increased edema, and postural dysfunction.   ACTIVITY LIMITATIONS: can do all but with some fatigue in arm  PARTICIPATION LIMITATIONS:  takes part in most activities without limitation but avoids heavy lifting  PERSONAL FACTORS: 3+ comorbidities: bilateral Mastectomies with Right SLNB, chemo and radiation  are also affecting patient's functional outcome.   REHAB POTENTIAL: Good  CLINICAL DECISION MAKING: Stable/uncomplicated  EVALUATION COMPLEXITY: Low  GOALS: Goals reviewed with patient? Yes  SHORT TERM GOALS: Target date: 12/14/2022  Pt will have decreased edema reduction at 10 cm prox to  ulna by 2 cm Baseline:31.5 Goal status: INITIAL  2.  Pt will have edema reduction by 1.5 cm at 10 cm prox to olecranon Baseline: 37.2 Goal status: INITIAL  3.  Pt will be compliant and tolerant of compression bandaging Baseline:  Goal status: INITIAL      LONG TERM GOALS: Target date: 01/04/2023  Pt will be independent with MLD to assist with decreasing edema Baseline:  Goal status: INITIAL  2.  Pt will have edema reduction by 4 cm at 10 cm prox to ulna Baseline: 31.2 Goal status: INITIAL  3.  Pt will be fit for compression garments and will be independent in their use. Baseline:  Goal status: INITIAL  4.  Pt will have edema reduction at Olecranon by 3 cm Baseline: 32 Goal status: INITIAL PLAN:  PT FREQUENCY: 3x/week  PT DURATION: 6 weeks  PLANNED INTERVENTIONS: Therapeutic exercises, Therapeutic activity, Patient/Family education, Self Care, Orthotic/Fit training, Manual lymph drainage, Compression bandaging, Vasopneumatic device, Manual therapy, and Re-evaluation  PLAN FOR NEXT SESSION:  Remeasure, practice bandaging if needed ,MLD, compression bandaging,  (night garment is, tribute without and power sleeve),     Norwood Levo, PT 12/12/2022, 11:25 AM

## 2022-12-14 ENCOUNTER — Ambulatory Visit: Payer: 59 | Admitting: Physical Therapy

## 2022-12-16 ENCOUNTER — Ambulatory Visit: Payer: 59 | Admitting: Rehabilitation

## 2022-12-16 ENCOUNTER — Encounter: Payer: Self-pay | Admitting: Rehabilitation

## 2022-12-16 DIAGNOSIS — Z171 Estrogen receptor negative status [ER-]: Secondary | ICD-10-CM

## 2022-12-16 DIAGNOSIS — I890 Lymphedema, not elsewhere classified: Secondary | ICD-10-CM | POA: Diagnosis not present

## 2022-12-16 NOTE — Therapy (Signed)
OUTPATIENT PHYSICAL THERAPY ONCOLOGY TREATMENT  Patient Name: Kara Crawford MRN: [id number] DOB:11/01/1965, 57 y.o., female Today's Date: 12/16/2022  END OF SESSION:  PT End of Session - 12/16/22 0910     Visit Number 8    Number of Visits 18    Date for PT Re-Evaluation 01/04/23    PT Start Time 0911    PT Stop Time 1000    PT Time Calculation (min) 49 min    Activity Tolerance Patient tolerated treatment well    Behavior During Therapy [facility] for tasks assessed/performed              Past Medical History:  Diagnosis Date   Anemia    anemia prior to uterine emboliztion procedure.   Breast cancer ([place])    Diabetes mellitus without complication ([place])    type 2   Family history of breast cancer    Family history of ovarian cancer    Family history of prostate cancer    Fibroids    Headache    Heart murmur    History of radiation therapy 03/07/18- 04/18/18   Right Chest wall 50 Gy in 25 fractions, Right supraclavicular fossa 50 Gy in 25 fractions, Right Chest wall scar boost 10 Gy in 5 fractions.    Hyperlipidemia    Hypertension    Past Surgical History:  Procedure Laterality Date   BREAST SURGERY     double mastectomy   BREATH TEK H PYLORI N/A 04/17/2014   Procedure: BREATH TEK H PYLORI;  Surgeon: Shann Medal, MD;  Location: [facility];  Service: General;  Laterality: N/A;   CESAREAN SECTION     x3- normal development   CYSTOSCOPY N/A 07/18/2018   Procedure: CYSTOSCOPY;  Surgeon: Christophe Louis, MD;  Location: [facility];  Service: Gynecology;  Laterality: N/A;   ESOPHAGOGASTRODUODENOSCOPY N/A 08/20/2015   Procedure: ESOPHAGOGASTRODUODENOSCOPY (EGD);  Surgeon: Alphonsa Overall, MD;  Location: [facility];  Service: General;  Laterality: N/A;   EXPLORATORY LAPAROTOMY  05/13/2015   GASTRIC ROUX-EN-Y N/A 10/06/2014   Procedure: LAPAROSCOPIC ROUX-EN-Y GASTRIC BYPASS WITH UPPER ENDOSCOPY;  Surgeon: Pedro Earls, MD;  Location: [facility];  Service: General;  Laterality: N/A;    LAPAROSCOPIC SALPINGO OOPHERECTOMY Bilateral 07/18/2018   Procedure: LAPAROSCOPIC SALPINGO OOPHORECTOMY;  Surgeon: Christophe Louis, MD;  Location: [facility];  Service: Gynecology;  Laterality: Bilateral;  possible laparotomy with abdominal bilateral salpingoophorectomy   LAPAROSCOPY N/A 05/12/2015   Procedure: LAPAROSCOPY REPAIR OF PERFORATED BOWEL;  Surgeon: Alphonsa Overall, MD;  Location: [place];  Service: General;  Laterality: N/A;   MASTECTOMY WITH RADIOACTIVE SEED GUIDED EXCISION AND AXILLARY SENTINEL LYMPH NODE BIOPSY Bilateral 01/22/2018   Procedure: BILATERAL MASTECTOMIES WITH RADIOACTIVE SEED TARGETED RIGHT AXILLARY [place] NODE EXCISION AND RIGHT SENTINEL LYMPH NODE BIOPSY;  Surgeon: Alphonsa Overall, MD;  Location: [place];  Service: General;  Laterality: Bilateral;   PORT-A-CATH REMOVAL Left 01/22/2018   Procedure: REMOVAL PORT-A-CATH;  Surgeon: Alphonsa Overall, MD;  Location: [place];  Service: General;  Laterality: Left;   PORTACATH PLACEMENT N/A 08/02/2017   Procedure: INSERTION PORT-A-CATH;  Surgeon: Alphonsa Overall, MD;  Location: [place];  Service: General;  Laterality: N/A;   UTERINE ARTERY EMBOLIZATION  08-16-2010   Patient Active Problem List   Diagnosis Date Noted   Cancer of right breast, stage 2 ([place]) 01/22/2018   Genetic testing 08/18/2017   BRCA2 positive 08/18/2017   Family history of breast cancer    Family history of ovarian cancer  Family history of prostate cancer    Malignant neoplasm of upper-outer quadrant of right breast in female, estrogen receptor negative ([place]) 07/25/2017   Perforated bowel ([place]) 05/13/2015   Free intraperitoneal air 05/12/2015   Submucosal lesion of esophagus 10/06/2014   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   S/P gastric bypass 10/06/2014   Diabetes ([place]) 09/24/2014   Morbid obesity ([place]) 03/20/2014    PCP: Theadore Nan, MD  REFERRING PROVIDER: Nicholas Lose, MD  REFERRING DIAG: Right UE lymphedema  THERAPY DIAG:  Lymphedema  of arm  Malignant neoplasm of upper-outer quadrant of right breast in female, estrogen receptor negative ([place])  ONSET DATE: 2019 with recent exacerbation  Rationale for Evaluation and Treatment: Rehabilitation  SUBJECTIVE:                                                                                                                                                                                           SUBJECTIVE STATEMENT:  I feel like I am doing well.  I was wanting to get the hand back down and now it is with my pump and self bandaging.  I can just return if needed as I wait for my garments.   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  01/22/18- underwent bilateral mastectomy and a SLNB on the right side with 0/6 positive LN's , Pt  completed chemotherapy and radiation. She has been seen here previously for lymphedema and shoulder ROM.  She has a night garment that she does not feel fits right. (She brought in [place] without velcro;should fit when she reduces.  PAIN:  Are you having pain? No NPRS scale: 0/10  PRECAUTIONS: Other: Right UE lymphedema  WEIGHT BEARING RESTRICTIONS: No  FALLS:  Has patient fallen in last 6 months? No  LIVING ENVIRONMENT: Lives with: lives with their spouse, and 2 grown kids. Lives in: House/apartment Stairs: Yes; Internal: 14 steps; on left going up and External: 4 steps; can reach both Has following equipment at home: None  OCCUPATION: NONE  LEISURE: spend time with family, work out,church,read,make soaps  HAND DOMINANCE: right   PRIOR LEVEL OF FUNCTION: Independent  PATIENT GOALS: Decrease right UE sweling   OBJECTIVE:  COGNITION: Overall cognitive status: Within functional limits for tasks assessed   PALPATION:   OBSERVATIONS / OTHER ASSESSMENTS: significant swelling right UE, mild inferior right axillary swelling  SENSATION: Light  touch: Appears intact POSTURE: forward head, rounded shoulders  UPPER EXTREMITY AROM/PROM: WFL   CERVICAL AROM: All within functional:  UPPER EXTREMITY STRENGTH: WNL  LYMPHEDEMA ASSESSMENTS:  SURGERY TYPE/DATE: Bilateral Mastectomies with Right SLNB  NUMBER OF LYMPH NODES REMOVED: 0/6  CHEMOTHERAPY: YES  RADIATION:YES  HORMONE TREATMENT:   INFECTIONS: no  LYMPHEDEMA ASSESSMENTS:   LANDMARK RIGHT  eval RIGHT 11/28/22 12/16/22   35.4 36.2   10 cm proximal to olecranon process 37.2 35.5 36  Olecranon process 32.0 30.5 32  10 cm proximal to ulnar styloid process 31.5 29.2 30  Just proximal to ulnar styloid process 21.7 21.1 19  Across hand at thumb web space 21.0 20.2 19.8  At base of 2nd digit 6.85 6.5 6.5  (Blank rows = not tested)  LANDMARK LEFT  eval  axilla 34.5  10 cm proximal to olecranon process 32.8  Olecranon process 26.4  10 cm proximal to ulnar styloid process 23.8  Just proximal to ulnar styloid process 18.0  Across hand at thumb web space 19.2  At base of 2nd digit 6.2  (Blank rows = not tested)   Circumference of chest 97.8  Lymphedema Life Impact Scale: 16.18   TODAY'S TREATMENT:                                                                                                                                          DATE:  2/16/2023In supine: Short neck, superficial and deep abdominals, L axillary nodes and establishment of interaxillary pathway, R inguinal nodes and establishment of axilloinguinal pathway, then R UE working proximal to distal, moving inner upper arm outwards and upwards, and doing both sides of forearm and hand, spending extra time in any areas of fibrosis then retracing all steps Compression bandaging as follows: lotion, TG soft from hand to axilla,  mollelast to digits except for 5th finger with TG soft pulled over it,artiflex hand to axilla,1 6cm bandage at hand,  8 cm bandage from wrist to axilla in herringbone fashion then 1 12  cm bandage from wrist to axilla in spiral.  2/12/2023In supine: Short neck, superficial and deep abdominals, L axillary nodes and establishment of interaxillary pathway, R inguinal nodes and establishment of axilloinguinal pathway, then R UE working proximal to distal, moving inner upper arm outwards and upwards, and doing both sides of forearm and hand, spending extra time in any areas of fibrosis then retracing all steps Compression bandaging as follows: lotion, TG soft from hand to axilla,  mollelast to digits except for 5th gfinger with TG soft pulled over it,artiflex hand to axilla,1 6cm bandage at hand, 2 8 cm bandage from wrist to axilla in herringbone fashion then 1 12 cm bandage from wrist to axilla in spiral.Encouraged pt to keep hand elevated and do forearm pronation and supination in bandages at home  Asked pt to keep bandages on til next visit if possible for measurement that visit   12/08/2022 In supine: Short neck, superficial and deep abdominals, L axillary nodes and establishment of interaxillary pathway, R inguinal nodes and establishment of  axilloinguinal pathway, then R UE working proximal to distal, moving inner upper arm outwards and upwards, and doing both sides of forearm and hand, spending extra time in any areas of fibrosis then retracing all steps  Compression bandaging as follows: lotion, TG soft from hand to axilla,  mollelast to digits 1-5with TG soft pulled over it,artiflex hand to axilla,1 6cm bandage at hand, 1 8 cm bandage from wrist to axilla in herringbone fashion then 1 12 cm bandage from wrist to axilla in spiral.   PATIENT EDUCATION:  Education details: circaid profile compression garment for night time use, fine to exercise in bandaging Person educated: Patient Education method: Demonstration Education comprehension: verbalized understanding  HOME EXERCISE PROGRAM: None given today  ASSESSMENT:  CLINICAL IMPRESSION: Pt is doing well and feels happy with how  her arm has returned to baseline and is ind with MLD and self bandaging.  She will cancel her appointments but knows to let us know if her sleeve does not work.    OBJECTIVE IMPAIRMENTS: decreased knowledge of condition, decreased knowledge of use of DME, increased edema, and postural dysfunction.   ACTIVITY LIMITATIONS: can do all but with some fatigue in arm  PARTICIPATION LIMITATIONS:  takes part in most activities without limitation but avoids heavy lifting  PERSONAL FACTORS: 3+ comorbidities: bilateral Mastectomies with Right SLNB, chemo and radiation  are also affecting patient's functional outcome.   REHAB POTENTIAL: Good  CLINICAL DECISION MAKING: Stable/uncomplicated  EVALUATION COMPLEXITY: Low  GOALS: Goals reviewed with patient? Yes  SHORT TERM GOALS: Target date: 12/14/2022  Pt will have decreased edema reduction at 10 cm prox to ulna by 2 cm Baseline:31.5 Goal status: Partially met at 1.5cm  2.  Pt will have edema reduction by 1.5 cm at 10 cm prox to olecranon Baseline: 37.2 Goal status: Partially met at 1.2   3.  Pt will be compliant and tolerant of compression bandaging Baseline:  Goal status:MET     LONG TERM GOALS: Target date: 01/04/2023  Pt will be independent with MLD to assist with decreasing edema Baseline:  Goal status: MET  2.  Pt will have edema reduction by 4 cm at 10 cm prox to ulna Baseline: 31.2 Goal status: NOT MET  3.  Pt will be fit for compression garments and will be independent in their use. Baseline:  Goal status: MET  4.  Pt will have edema reduction at Olecranon by 3 cm Baseline: 32 Goal status: NOTMET PLAN:  PT FREQUENCY: 3x/week  PT DURATION: 6 weeks  PLANNED INTERVENTIONS: Therapeutic exercises, Therapeutic activity, Patient/Family education, Self Care, Orthotic/Fit training, Manual lymph drainage, Compression bandaging, Vasopneumatic device, Manual therapy, and Re-evaluation  PLAN FOR NEXT SESSION:  Remeasure, practice  bandaging if needed ,MLD, compression bandaging,  (night garment is, tribute without and power sleeve),     Stark Bray, PT 12/16/2022, 12:26 PM  PHYSICAL THERAPY DISCHARGE SUMMARY  Visits from Start of Care: 8  Current functional level related to goals / functional outcomes: Pt is now ind with bandaging and use of her flexitouch as she awaits garments as well as self MLD.     Remaining deficits: Chronic lymphedema   Education / Equipment: Sleeve, night garment, pump  Plan: Patient agrees to discharge.  Patient goals were partially met.

## 2022-12-21 ENCOUNTER — Encounter: Payer: 59 | Admitting: Physical Therapy

## 2022-12-26 ENCOUNTER — Encounter: Payer: 59 | Admitting: Rehabilitation

## 2022-12-28 ENCOUNTER — Encounter: Payer: 59 | Admitting: Physical Therapy

## 2022-12-30 ENCOUNTER — Encounter: Payer: 59 | Admitting: Physical Therapy

## 2023-01-02 ENCOUNTER — Encounter: Payer: 59 | Admitting: Physical Therapy

## 2023-01-04 ENCOUNTER — Encounter: Payer: 59 | Admitting: Physical Therapy

## 2023-01-06 ENCOUNTER — Encounter: Payer: 59 | Admitting: Rehabilitation

## 2023-08-31 ENCOUNTER — Other Ambulatory Visit (HOSPITAL_BASED_OUTPATIENT_CLINIC_OR_DEPARTMENT_OTHER): Payer: Self-pay | Admitting: Family Medicine

## 2023-08-31 DIAGNOSIS — E782 Mixed hyperlipidemia: Secondary | ICD-10-CM

## 2023-09-27 ENCOUNTER — Ambulatory Visit (HOSPITAL_BASED_OUTPATIENT_CLINIC_OR_DEPARTMENT_OTHER)
Admission: RE | Admit: 2023-09-27 | Discharge: 2023-09-27 | Disposition: A | Discharge status: Home / Self Care | Payer: 59 | Source: Ambulatory Visit | Attending: Family Medicine | Admitting: Family Medicine

## 2023-09-27 DIAGNOSIS — E782 Mixed hyperlipidemia: Secondary | ICD-10-CM | POA: Insufficient documentation

## 2023-12-13 ENCOUNTER — Encounter: Payer: Self-pay | Admitting: Cardiology

## 2023-12-13 ENCOUNTER — Encounter: Payer: Self-pay | Admitting: *Deleted

## 2023-12-13 ENCOUNTER — Ambulatory Visit: Payer: 59 | Attending: Cardiology | Admitting: Cardiology

## 2023-12-13 VITALS — BP 138/82 | HR 77 | Resp 16 | Ht 60.0 in | Wt 187.0 lb

## 2023-12-13 DIAGNOSIS — E119 Type 2 diabetes mellitus without complications: Secondary | ICD-10-CM | POA: Diagnosis not present

## 2023-12-13 DIAGNOSIS — E7801 Familial hypercholesterolemia: Secondary | ICD-10-CM | POA: Diagnosis not present

## 2023-12-13 DIAGNOSIS — I10 Essential (primary) hypertension: Secondary | ICD-10-CM | POA: Diagnosis not present

## 2023-12-13 DIAGNOSIS — R931 Abnormal findings on diagnostic imaging of heart and coronary circulation: Secondary | ICD-10-CM | POA: Diagnosis not present

## 2023-12-13 DIAGNOSIS — E559 Vitamin D deficiency, unspecified: Secondary | ICD-10-CM

## 2023-12-13 DIAGNOSIS — E78019 Familial hypercholesterolemia, unspecified: Secondary | ICD-10-CM

## 2023-12-13 NOTE — Patient Instructions (Addendum)
Medication Instructions:  Your physician recommends that you continue on your current medications as directed. Please refer to the Current Medication list given to you today.  *If you need a refill on your cardiac medications before your next appointment, please call your pharmacy*   Lab Work: Have lab work checked when convenient. Lipid profile.  This is fasting.  Can be done at any [facility] If you have labs (blood work) drawn today and your tests are completely normal, you will receive your results only by: MyChart Message (if you have MyChart) OR A paper copy in the mail If you have any lab test that is abnormal or we need to change your treatment, we will call you to review the results.   Testing/Procedures: Your physician has requested that you have an exercise stress myoview. For further information please visit [email or web address] Please follow instruction sheet, as given.    Follow-Up: At [facility], you and your health needs are our priority.  As part of our continuing mission to provide you with exceptional heart care, we have created designated Provider Care Teams.  These Care Teams include your primary Cardiologist (physician) and Advanced Practice Providers (APPs -  Physician Assistants and Nurse Practitioners) who all work together to provide you with the care you need, when you need it.  We recommend signing up for the patient portal called "MyChart".  Sign up information is provided on this After Visit Summary.  MyChart is used to connect with patients for Virtual Visits (Telemedicine).  Patients are able to view lab/test results, encounter notes, upcoming appointments, etc.  Non-urgent messages can be sent to your provider as well.   To learn more about what you can do with MyChart, go to [email or web address].    Your next appointment:   2 month(s)  Provider:   Dr Jacinto Halim or APP    Other Instructions

## 2023-12-13 NOTE — Progress Notes (Signed)
Cardiology Office Note:  .   Date:  12/13/2023  ID:  Kara Crawford, DOB [birth year], MRN [id number] PCP: Gweneth Dimitri, MD  [facility] Providers Cardiologist:  Yates Decamp, MD   History of Present Illness: .   Kara Crawford is a 58 y.o. African-American female patient with hypertension, hypercholesterolemia, diabetes mellitus, history of right breast cancer status post bilateral mastectomy in 2018 followed by RT/CT referred to me for evaluation of elevated coronary calcium score.  Discussed the use of AI scribe software for clinical note transcription with the patient, who gave verbal consent to proceed.  History of Present Illness   The patient, with a history of hypertension, hypercholesterolemia, diabetes mellitus, and right breast cancer, was referred to cardiology due to an elevated coronary calcium score. The patient reports that her blood pressure is well controlled and she takes vitamin D3 over the counter. She exercises regularly, doing weight training and walking when the weather permits.   She reports a recent weight gain, but has since returned to her post-cancer treatment weight. The patient had a double mastectomy in 2018 following a diagnosis of right breast cancer, and underwent both chemotherapy and radiation treatment. There is a family history of breast cancer, which led to the decision for a bilateral mastectomy.   The patient denies any history of smoking and reports occasional alcohol consumption. She has been on cholesterol medication since after the birth of her third child, who is now 63 years old. Recently, her cholesterol medication was changed from atorvastatin to rosuvastatin, and ezetimibe was added to her regimen.      Labs   External Labs:  Lipid profile 11/21/2023:  Total cholesterol 148, triglycerides 118, HDL 37, LDL 89.  PCP faxed labs labs 08/20/2023:  Serum glucose 101 mg, BUN 8, creatinine 0.82, EGFR 83 mL, potassium 3.9, LFTs  normal.  A1c 7.0%.  Total cholesterol 256, triglycerides 185, HDL 38, LDL 183.  Lipid profile 04/28/2023:  Total cholesterol 322, triglycerides 204, HDL 37, LDL 243.  Review of Systems  Cardiovascular:  Negative for chest pain, dyspnea on exertion and leg swelling.   Physical Exam:   VS:  BP 138/82 (BP Location: Left Arm, Patient Position: Sitting, Cuff Size: Large)   Pulse 77   Resp 16   Ht 5' (1.524 m)   Wt 187 lb (84.8 kg)   LMP 08/19/2015   SpO2 95%   BMI 36.52 kg/m    Wt Readings from Last 3 Encounters:  12/13/23 187 lb (84.8 kg)  11/09/22 191 lb 3.2 oz (86.7 kg)  11/09/21 190 lb 9.6 oz (86.5 kg)   Physical Exam Constitutional:      Appearance: She is obese.  Neck:     Vascular: No carotid bruit or JVD.  Cardiovascular:     Rate and Rhythm: Normal rate and regular rhythm.     Pulses: Intact distal pulses.     Heart sounds: Normal heart sounds. No murmur heard.    No gallop.  Pulmonary:     Effort: Pulmonary effort is normal.     Breath sounds: Normal breath sounds.  Abdominal:     General: Bowel sounds are normal.     Palpations: Abdomen is soft.  Musculoskeletal:     Right lower leg: No edema.     Left lower leg: No edema.    Studies Reviewed: .    Echocardiogram 05/04/2018:  - Left ventricle: The cavity size was normal. Systolic function was  normal. The estimated ejection  fraction was in the range of 60% to 65%. Wall motion was normal; there were no regional wall motion abnormalities. Left ventricular diastolic function parameters were normal. - Atrial septum: No defect or patent foramen ovale was identified. - Impressions: Normal GLS -19.6.  Coronary calcium score 10/01/2023: Total coronary calcium score of 762, MESA database 99 percentile. LM 0 LAD 335 LCx 104 RCA 22. Normal ascending aortic size.  Aortic atherosclerosis.  EKG:    EKG Interpretation Date/Time:  Wednesday December 13 2023 09:52:57 EST Ventricular Rate:  79 PR  Interval:  194 QRS Duration:  80 QT Interval:  394 QTC Calculation: 451 R Axis:   -7  Text Interpretation: EKG 12/13/2023: Normal sinus rhythm at a rate of 79 bpm, normal axis, no evidence of ischemia, normal EKG.  No significant change from 07/31/2017. Confirmed by Delrae Rend [phone]) on 12/13/2023 10:07:34 AM   Medications and allergies    Allergies  Allergen Reactions   Metformin Hcl     Other Reaction(s): stomach upset    Current Outpatient Medications:    acetaminophen (TYLENOL) 500 MG tablet, Take 1,000 mg by mouth daily as needed for moderate pain or headache., Disp: , Rfl:    amLODipine (NORVASC) 10 MG tablet, Take 10 mg by mouth every morning. , Disp: , Rfl:    aspirin 81 MG chewable tablet, Chew 81 mg by mouth daily., Disp: , Rfl:    Calcium Citrate-Vitamin D (CALCIUM + D PO), Take 2 tablets by mouth daily., Disp: , Rfl:    diphenhydramine-acetaminophen (TYLENOL PM) 25-500 MG TABS tablet, Take 1-2 tablets by mouth at bedtime as needed (sleep)., Disp: , Rfl:    ezetimibe (ZETIA) 10 MG tablet, Take 10 mg by mouth daily., Disp: , Rfl:    linaGLIPtin-metFORMIN HCl ER (JENTADUETO XR) 02-999 MG TB24, Take 1 tablet by mouth daily., Disp: , Rfl:    lisinopril-hydrochlorothiazide (PRINZIDE,ZESTORETIC) 20-12.5 MG per tablet, Take 2 tablets by mouth daily. , Disp: , Rfl:    LORazepam (ATIVAN) 0.5 MG tablet, Take 1 tablet (0.5 mg total) by mouth every 8 (eight) hours., Disp: 30 tablet, Rfl: 0   metoprolol succinate (TOPROL-XL) 50 MG 24 hr tablet, Take 50 mg by mouth daily. Take with or immediately following a meal., Disp: , Rfl:    OZEMPIC, 0.25 OR 0.5 MG/DOSE, 2 MG/3ML SOPN, Inject 0.25 mLs into the skin once a week., Disp: , Rfl:    pantoprazole (PROTONIX) 40 MG tablet, Take 40 mg by mouth daily., Disp: , Rfl:    Polyethyl Glycol-Propyl Glycol (SYSTANE OP), Place 1 drop into both eyes daily as needed (dry eyes)., Disp: , Rfl:    Turmeric 500 MG CAPS, Take 1 tablet by mouth daily.,  Disp: , Rfl:    vitamin B-12 (CYANOCOBALAMIN) 1000 MCG tablet, Take 1,000 mcg by mouth daily. , Disp: , Rfl:    Zinc 50 MG TABS, Take 1 tablet by mouth daily., Disp: , Rfl:    atorvastatin (LIPITOR) 80 MG tablet, Take 80 mg by mouth every morning. , Disp: , Rfl:  No current facility-administered medications for this visit.  Facility-Administered Medications Ordered in Other Visits:    sodium chloride flush (NS) 0.9 % injection 10 mL, 10 mL, Intravenous, PRN, Serena Croissant, MD, 10 mL at 08/03/17 1516   sodium chloride flush (NS) 0.9 % injection 10 mL, 10 mL, Intravenous, PRN, Serena Croissant, MD, 10 mL at 08/17/17 1531   sodium chloride flush (NS) 0.9 % injection 10 mL, 10 mL, Intravenous, PRN, Pamelia Hoit,  Vinay, MD, 10 mL at 10/26/17 1014   ASSESSMENT AND PLAN: .      ICD-10-CM   1. Elevated coronary artery calcium score 10/01/2023:  762, MESA database 99 percentile.  R93.1 EKG 12-Lead    MYOCARDIAL PERFUSION IMAGING    Cardiac Stress Test: Informed Consent Details: Physician/Practitioner Attestation; Transcribe to consent form and obtain patient signature    2. Familial hypercholesterolemia  E78.01 MYOCARDIAL PERFUSION IMAGING    Cardiac Stress Test: Informed Consent Details: Physician/Practitioner Attestation; Transcribe to consent form and obtain patient signature    Lipid Profile    3. Type 2 diabetes mellitus without complication, without long-term current use of insulin (HCC)  E11.9 MYOCARDIAL PERFUSION IMAGING    Cardiac Stress Test: Informed Consent Details: Physician/Practitioner Attestation; Transcribe to consent form and obtain patient signature    4. Primary hypertension  I10     5. Hypovitaminosis D  E55.9      Assessment and Plan    Elevated Coronary Calcium Score   An elevated coronary calcium score of 762 places her at the 99th percentile for age, indicating high risk for coronary artery disease. This risk is compounded by diabetes, hypertension, hypercholesterolemia, and  prior chest radiation therapy. The significant risk for cardiac events necessitates aggressive management. A nuclear stress test is ordered, with instructions to walk on a treadmill during the test and a chemical injection to scan the heart for blockages. Stress test results will be reviewed at follow-up.  Hypertension   Blood pressure is well controlled on amlodipine 10 mg, lisinopril HCT 20/12.5 mg, and metoprolol succinate 50 mg daily, with a reading of 138/82 mmHg today. Current antihypertensive regimen will continue, but metoprolol should be withheld one day before and on the day of the stress test. Blood pressure management will be reviewed at follow-up.  Familial Hypercholesterolemia   A strong family history of hypercholesterolemia is noted. Reviewed her external labs from PCP, also reviewed her original labs from June 2024 where her LDL was 243 and total cholesterol of 322 suggesting heterozygous familial hypercholesterolemia.  She was switched from 80 mg of atorvastatin and she is on rosuvastatin 40 mg and ezetimibe was added. A fasting lipid panel is ordered to monitor lipids and assess potential medication adjustments.  If LDL remains >70, she may need PCSK9 inhibitors or addition of bempedoic acid.  Results will be reviewed at follow-up.  Diabetes Mellitus   Currently on Ozempic, started last week, with a report of a slight headache but no significant side effects. Diabetes is without complications. Emphasized dietary management, particularly reducing sugar intake from fruits, and highlighted the cardiovascular benefits of Ozempic. Continue Ozempic and closely monitor diet. Diabetes management will be reviewed at follow-up.  General Health Maintenance   Engages in regular physical activity and takes over-the-counter vitamin D3. History of right breast cancer treated with bilateral mastectomy, chemotherapy, and radiation in 2018. Encouraged to continue physical activity and monitor weight and  diet closely. Follow-up with primary care physician for routine health maintenance. Patient has history of hypovitaminosis D, probably will benefit from vitamin D level checked.  She can do this with her PCP going forward.  Follow-up   A follow-up appointment will be scheduled after the stress test, and coordination with the primary care physician for ongoing management is advised.      Signed,  Yates Decamp, MD, [facility] 12/13/2023, 6:16 PM [facility] [address] [place], [place] [address] Phone: [phone]. Fax:  [phone]

## 2023-12-14 ENCOUNTER — Telehealth (HOSPITAL_COMMUNITY): Payer: Self-pay

## 2023-12-14 NOTE — Telephone Encounter (Signed)
Spoke with the patient, detailed instructions given. She stated that she would be here for her test. Asked to call back with any questions. S.Kara Crawford CCT

## 2023-12-16 LAB — LIPID PANEL
Chol/HDL Ratio: 4.6 {ratio} — ABNORMAL HIGH (ref 0.0–4.4)
Cholesterol, Total: 158 mg/dL (ref 100–199)
HDL: 34 mg/dL — ABNORMAL LOW (ref >39)
LDL Chol Calc (NIH): 103 mg/dL — ABNORMAL HIGH (ref 0–99)
Triglycerides: 116 mg/dL (ref 0–149)
VLDL Cholesterol Cal: 21 mg/dL (ref 5–40)

## 2023-12-17 ENCOUNTER — Encounter: Payer: Self-pay | Admitting: Cardiology

## 2023-12-17 NOTE — Progress Notes (Signed)
She is diabetic and has familial hypercholesterolemia, on maximum dose of statin along with ezetimibe.  If her insurance allows, Nexletol would bring her LDL to goal <70 otherwise best option is to switch her to Repatha 140 mg every 2 weeks and discontinue Zetia.  Please refer her to lipid clinic/pharmacy

## 2023-12-18 ENCOUNTER — Other Ambulatory Visit: Payer: Self-pay

## 2023-12-18 DIAGNOSIS — E7801 Familial hypercholesterolemia: Secondary | ICD-10-CM

## 2023-12-19 ENCOUNTER — Encounter: Payer: Self-pay | Admitting: Cardiology

## 2023-12-19 ENCOUNTER — Ambulatory Visit (HOSPITAL_COMMUNITY): Payer: 59 | Attending: Cardiology

## 2023-12-19 DIAGNOSIS — E119 Type 2 diabetes mellitus without complications: Secondary | ICD-10-CM

## 2023-12-19 DIAGNOSIS — R931 Abnormal findings on diagnostic imaging of heart and coronary circulation: Secondary | ICD-10-CM

## 2023-12-19 DIAGNOSIS — E7801 Familial hypercholesterolemia: Secondary | ICD-10-CM

## 2023-12-19 LAB — MYOCARDIAL PERFUSION IMAGING
Angina Index: 0
Base ST Depression (mm): 0 mm
Duke Treadmill Score: 6
Estimated workload: 7
Exercise duration (min): 5 min
Exercise duration (sec): 45 s
LV dias vol: 74 mL (ref 46–106)
LV sys vol: 23 mL
MPHR: 163 {beats}/min
Nuc Stress EF: 68 %
Peak HR: 176 {beats}/min
Percent HR: 100 %
Rest HR: 85 {beats}/min
Rest Nuclear Isotope Dose: 10.6 mCi
SDS: 1
SRS: 0
SSS: 1
ST Depression (mm): 0 mm
Stress Nuclear Isotope Dose: 32.8 mCi
TID: 0.89

## 2023-12-19 MED ORDER — TECHNETIUM TC 99M TETROFOSMIN IV KIT
10.6000 | PACK | Freq: Once | INTRAVENOUS | Status: AC | PRN
  Administered 2023-12-19: 10.6 via INTRAVENOUS

## 2023-12-19 MED ORDER — TECHNETIUM TC 99M TETROFOSMIN IV KIT
32.8000 | PACK | Freq: Once | INTRAVENOUS | Status: AC | PRN
  Administered 2023-12-19: 32.8 via INTRAVENOUS

## 2023-12-19 NOTE — Progress Notes (Signed)
Normal nuclear stress test, normal heart function

## 2024-01-30 NOTE — Progress Notes (Signed)
 Cardiology Office Note:  .   Date:  02/13/2024  ID:  Kara Crawford, DOB 03/27/1966, MRN [id number] PCP: Helyn Lobstein, MD  [facility] Providers Cardiologist:  Knox Perl, MD    History of Present Illness: .   Kara Crawford is a 58 y.o. female  with hypertension, hypercholesterolemia, diabetes mellitus, history of right breast cancer status post bilateral mastectomy in 2018 followed by RT/CT.   She was referred to Dr. Berry Bristol for elevated coronary calcium score 762. Repatha to be started today. Denies chest pain, dyspnea, palpitations, edema. Does cardio and weight training, walks the park, 45-60 min 5 days/week. Reviewed CT scan in detail.   ROS:    Studies Reviewed: Aaron Aas         Prior CV Studies: GATED SPECT MYO PERF W/EXERCISE STRESS 1D 12/19/2023  Narrative   The study is normal. The study is low risk.   No ST deviation was noted.   LV perfusion is normal. There is no evidence of ischemia. There is no evidence of infarction.   Left ventricular function is normal. Nuclear stress EF: 68%. The left ventricular ejection fraction is hyperdynamic (>65%). End diastolic cavity size is normal. End systolic cavity size is normal.     Echocardiogram 05/04/2018:  - Left ventricle: The cavity size was normal. Systolic function was  normal. The estimated ejection fraction was in the range of 60% to 65%. Wall motion was normal; there were no regional wall motion abnormalities. Left ventricular diastolic function parameters were normal. - Atrial septum: No defect or patent foramen ovale was identified. - Impressions: Normal GLS -19.6.   Coronary calcium score 10/01/2023: Total coronary calcium score of 762, MESA database 99 percentile. LM 0 LAD 335 LCx 104 RCA 22. Normal ascending aortic size.  Aortic atherosclerosis.    Risk Assessment/Calculations:             Physical Exam:   VS:  BP 120/80   Pulse 71   Ht 5' (1.524 m)   Wt 181 lb (82.1 kg)   LMP 08/19/2015   SpO2 97%    BMI 35.35 kg/m    Wt Readings from Last 3 Encounters:  02/13/24 181 lb (82.1 kg)  12/19/23 187 lb (84.8 kg)  12/13/23 187 lb (84.8 kg)    GEN: Obese, in no acute distress NECK: No JVD; No carotid bruits CARDIAC:  RRR, no murmurs, rubs, gallops RESPIRATORY:  Clear to auscultation without rales, wheezing or rhonchi  ABDOMEN: Soft, non-tender, non-distended EXTREMITIES:  No edema; No deformity   ASSESSMENT AND PLAN: .    Elevated Coronary Calcium Score   An elevated coronary calcium score of 762 places her at the 99th percentile for age, indicating high risk for coronary artery disease. This risk is compounded by diabetes, hypertension, hypercholesterolemia, and prior chest radiation therapy. NST was normal 12/2023. No symptoms.   Hypertension   Blood pressure is well controlled on amlodipine 10 mg, lisinopril HCT 20/12.5 mg, and metoprolol succinate 50 mg daily,     Familial Hypercholesterolemia   A strong family history of hypercholesterolemia is noted. Reviewed her external labs from PCP, also reviewed her original labs from June 2024 where her LDL was 243 and total cholesterol of 322 suggesting heterozygous familial hypercholesterolemia.  She was switched from 80 mg of atorvastatin and she is on rosuvastatin 40 mg and ezetimibe was added. LDL 109 and referred to lipid clinic for goal LDL <70, Repatha to be started today   Diabetes Mellitus  Currently on Ozempic-causing constipation, Diabetes is without complications. . Continue Ozempic and closely monitor diet. Has lost 6 lbs so far.           Dispo: f/u in 1 yr  Signed, Theotis Flake, PA-C

## 2024-02-05 NOTE — Progress Notes (Unsigned)
 Patient ID: Kara Crawford                 DOB: 03/25/1966                    MRN: [id number]     HPI: Kara Crawford is a 58 y.o. female patient referred to lipid clinic by Dr. Jacinto Halim. PMH is significant for heterozygous familial hypercholesterolemia, T2DM, HTN, right breast cancer s/p bilateral mastectomy (2018).  She has been on cholesterol medications since age 59 since the birth of her third child. In June 2024, she was on atorvastatin 80 mg and found to have LDL of 243. She was then switched to rosuvastatin 40 mg and ezetimibe was added. Last lipid panel 12/15/23 shows LDL 103, although LDL was improved slightly at 89 in January 2025.  At today's visit, discussed how patient is making significant changes to lifestyle for her overall health. She recently started Ozempic 4 weeks ago and has been experiencing some constipation. Takes Miralax PRN and it only helps a little bit. She has been juicing a lot of fruits and vegetables, and cooks most meals at home. Other than this constipation, she is happy with her current medication regimen and has no concerns.  Reviewed PCSK-9 inhibitors for lowering cholesterol. Discussed that Repatha has little to no risk of causing cancer. Discussed mechanisms of action, dosing, side effects and potential decreases in LDL cholesterol. Also reviewed cost information and potential options for patient assistance. Repatha is about $55/month with her [facility] plan, but patient qualifies for $15 copay coupon.  Current Medications: rosuvastatin 40 mg, ezetimibe 10mg  daily  Intolerances: atorvastatin 80 mg (switched to rosu due to elevated LDL) Risk Factors: HTN, T2DM, elevated CAC in 99th percentile LDL-C goal: < 70 mg/dl (could consider <52 due to premature CAD on CT and DM) ApoB goal: < 80 mg/dl  Diet: Major diet changes lately. Juicing fruits and vegetables (apples, celery, kale). Lots of salmon, [place], no red meat/chicken. Nuts and seeds for snacks. Very occasional "cheat  foods". She does intermittent fasting and eats in the hours of 10-6.  Drinks: lots of water   Exercise: exercises regularly, weight training, stretching, walking/running in hilly areas 6x a week  Family History:  Mother: diabetes, HTN, stroke Father: diabetes, heart disease, HTN, prostate cancer Sister: stroke Strong history of breast cancer, familial hypercholesterolemia  Social History: No smoking history. Occasional alcohol medication.  Labs: Lipid Panel     Component Value Date/Time   CHOL 158 12/15/2023 0846   TRIG 116 12/15/2023 0846   HDL 34 (L) 12/15/2023 0846   CHOLHDL 4.6 (H) 12/15/2023 0846   LDLCALC 103 (H) 12/15/2023 0846   LABVLDL 21 12/15/2023 0846    Past Medical History:  Diagnosis Date   Acid reflux    Anemia    anemia prior to uterine emboliztion procedure.   Breast cancer (HCC)    Diabetes mellitus without complication (HCC)    type 2   DM (diabetes mellitus) (HCC)    Family history of breast cancer    Family history of ovarian cancer    Family history of prostate cancer    Fibroids    Gastric ulcer    Headache    Heart murmur    History of radiation therapy 03/07/18- 04/18/18   Right Chest wall 50 Gy in 25 fractions, Right supraclavicular fossa 50 Gy in 25 fractions, Right Chest wall scar boost 10 Gy in 5 fractions.    Hot flashes  Hyperlipidemia    Hypertension    IFG (impaired fasting glucose)    Insomnia    Lymphedema    Menometrorrhagia    Obesity    Rotator cuff syndrome of right shoulder    Vitamin D deficiency     Current Outpatient Medications on File Prior to Visit  Medication Sig Dispense Refill   amLODipine (NORVASC) 10 MG tablet Take 10 mg by mouth every morning.      Calcium Citrate-Vitamin D (CALCIUM + D PO) Take 2 tablets by mouth daily.     Empagliflozin-metFORMIN HCl ER (SYNJARDY XR) 07-999 MG TB24 Take 1 tablet by mouth daily.     ezetimibe (ZETIA) 10 MG tablet Take 10 mg by mouth daily.      lisinopril-hydrochlorothiazide (PRINZIDE,ZESTORETIC) 20-12.5 MG per tablet Take 2 tablets by mouth daily.      metoprolol succinate (TOPROL-XL) 50 MG 24 hr tablet Take 50 mg by mouth daily. Take with or immediately following a meal.     OZEMPIC, 0.25 OR 0.5 MG/DOSE, 2 MG/3ML SOPN Inject 0.25 mLs into the skin once a week.     rosuvastatin (CRESTOR) 40 MG tablet Take 40 mg by mouth daily.     Turmeric 500 MG CAPS Take 1 tablet by mouth daily.     vitamin B-12 (CYANOCOBALAMIN) 1000 MCG tablet Take 1,000 mcg by mouth daily.      Zinc 50 MG TABS Take 1 tablet by mouth daily.     acetaminophen (TYLENOL) 500 MG tablet Take 1,000 mg by mouth daily as needed for moderate pain or headache.     aspirin 81 MG chewable tablet Chew 81 mg by mouth daily.     atorvastatin (LIPITOR) 80 MG tablet Take 80 mg by mouth every morning.      diphenhydramine-acetaminophen (TYLENOL PM) 25-500 MG TABS tablet Take 1-2 tablets by mouth at bedtime as needed (sleep).     linaGLIPtin-metFORMIN HCl ER (JENTADUETO XR) 02-999 MG TB24 Take 1 tablet by mouth daily.     LORazepam (ATIVAN) 0.5 MG tablet Take 1 tablet (0.5 mg total) by mouth every 8 (eight) hours. 30 tablet 0   pantoprazole (PROTONIX) 40 MG tablet Take 40 mg by mouth daily. (Patient not taking: Reported on 02/06/2024)     Polyethyl Glycol-Propyl Glycol (SYSTANE OP) Place 1 drop into both eyes daily as needed (dry eyes).     Current Facility-Administered Medications on File Prior to Visit  Medication Dose Route Frequency Provider Last Rate Last Admin   sodium chloride flush (NS) 0.9 % injection 10 mL  10 mL Intravenous PRN Serena Croissant, MD   10 mL at 08/03/17 1516   sodium chloride flush (NS) 0.9 % injection 10 mL  10 mL Intravenous PRN Serena Croissant, MD   10 mL at 08/17/17 1531   sodium chloride flush (NS) 0.9 % injection 10 mL  10 mL Intravenous PRN Serena Croissant, MD   10 mL at 10/26/17 1014    Allergies  Allergen Reactions   Metformin Hcl     Other Reaction(s):  stomach upset    Assessment/Plan:  1. Hyperlipidemia  Familial hypercholesterolemia Assessment:  Familial hypercholesterolemia managed with rosuvastatin 40 mg and ezetimibe LDL 103 not at goal of < 70 mg/dl Candidate for intensified lipid-lowering therapy with PCKS9 inhibitor. Experiencing some constipation that is not relieved with PRN Miralax since starting Ozempic a month ago. Frustrated she is not losing weight.   Plan:  Initiate Repatha today. PA in process. Once approved, will contact patient  to apply for $15/month coupon. Reviewed proper injection technique of Repatha and educated on importance of continuing rosuvastatin/ezetimibe even with PCSK9. Repeat labs in 3 months Recommended if patient has gone more than 2 days without a bowel movement to take a dose of Miralax, and to use more consistently to keep her regular. Recommended prunes and pears for her juices for additional fiber.  Acknowledged patient for her efforts in healthy lifestyle and advised she will need to maintain consistency for heart health and steady weight loss.    Thank you,  Lendon Ka, PharmD Candidate [facility] Extern 02/06/24  Olene Floss, Pharm.D, BCACP, CPP [facility] [address] [place], [place] [address]  Phone: [phone]; Fax: [phone]

## 2024-02-06 ENCOUNTER — Encounter: Payer: Self-pay | Admitting: Hematology and Oncology

## 2024-02-06 ENCOUNTER — Telehealth: Payer: Self-pay | Admitting: Pharmacy Technician

## 2024-02-06 ENCOUNTER — Ambulatory Visit: Payer: 59 | Attending: Cardiology | Admitting: Pharmacist

## 2024-02-06 ENCOUNTER — Other Ambulatory Visit (HOSPITAL_COMMUNITY): Payer: Self-pay

## 2024-02-06 DIAGNOSIS — E7801 Familial hypercholesterolemia: Secondary | ICD-10-CM

## 2024-02-06 NOTE — Telephone Encounter (Signed)
 Ran test claim for REPATHA. For a 28 day supply and the co-pay is 24.99 . PA is not needed at this time. Nothing saying this is a transition fill. This test claim was processed through [facility]- copay amounts may vary at other pharmacies due to pharmacy/plan contracts, or as the patient moves through the different stages of their insurance plan.

## 2024-02-06 NOTE — Assessment & Plan Note (Addendum)
 Assessment:  Familial hypercholesterolemia managed with rosuvastatin 40 mg and ezetimibe LDL 103 not at goal of < 70 mg/dl Candidate for intensified lipid-lowering therapy with PCKS9 inhibitor. Experiencing some constipation that is not relieved with PRN Miralax since starting Ozempic a month ago. Frustrated she is not losing weight.   Plan:  Initiate Repatha today. PA in process. Once approved, will contact patient to apply for $15/month coupon. Reviewed proper injection technique of Repatha and educated on importance of continuing rosuvastatin/ezetimibe even with PCSK9. Repeat labs in 3 months Recommended if patient has gone more than 2 days without a bowel movement to take a dose of Miralax, and to use more consistently to keep her regular. Recommended prunes and pears for her juices for additional fiber.  Acknowledged patient for her efforts in healthy lifestyle and advised she will need to maintain consistency for heart health and steady weight loss.

## 2024-02-07 MED ORDER — REPATHA SURECLICK 140 MG/ML ~~LOC~~ SOAJ: 1.0000 mL | SUBCUTANEOUS | 11 refills | Status: AC

## 2024-02-07 NOTE — Telephone Encounter (Signed)
 Patient made aware Repatha covered. Rx sent to [facility] per pt request She will get labs in 3 months

## 2024-02-07 NOTE — Addendum Note (Signed)
 Addended by: Malena Peer D on: 02/07/2024 02:26 PM   Modules accepted: Orders

## 2024-02-13 ENCOUNTER — Ambulatory Visit: Payer: 59 | Attending: Physician Assistant | Admitting: Physician Assistant

## 2024-02-13 ENCOUNTER — Encounter: Payer: Self-pay | Admitting: Physician Assistant

## 2024-02-13 VITALS — BP 120/80 | HR 71 | Ht 60.0 in | Wt 181.0 lb

## 2024-02-13 DIAGNOSIS — E7801 Familial hypercholesterolemia: Secondary | ICD-10-CM

## 2024-02-13 DIAGNOSIS — I10 Essential (primary) hypertension: Secondary | ICD-10-CM

## 2024-02-13 DIAGNOSIS — E119 Type 2 diabetes mellitus without complications: Secondary | ICD-10-CM | POA: Diagnosis not present

## 2024-02-13 DIAGNOSIS — R931 Abnormal findings on diagnostic imaging of heart and coronary circulation: Secondary | ICD-10-CM | POA: Diagnosis not present

## 2024-02-13 NOTE — Patient Instructions (Signed)
 Medication Instructions:  Your physician recommends that you continue on your current medications as directed. Please refer to the Current Medication list given to you today.  *If you need a refill on your cardiac medications before your next appointment, please call your pharmacy*  Lab Work: NONE If you have labs (blood work) drawn today and your tests are completely normal, you will receive your results only by: MyChart Message (if you have MyChart) OR A paper copy in the mail If you have any lab test that is abnormal or we need to change your treatment, we will call you to review the results.  Testing/Procedures: NONE  Follow-Up: At [facility], you and your health needs are our priority.  As part of our continuing mission to provide you with exceptional heart care, our providers are all part of one team.  This team includes your primary Cardiologist (physician) and Advanced Practice Providers or APPs (Physician Assistants and Nurse Practitioners) who all work together to provide you with the care you need, when you need it.  Your next appointment:   1 year(s)  Provider:   Knox Perl, MD   We recommend signing up for the patient portal called "MyChart".  Sign up information is provided on this After Visit Summary.  MyChart is used to connect with patients for Virtual Visits (Telemedicine).  Patients are able to view lab/test results, encounter notes, upcoming appointments, etc.  Non-urgent messages can be sent to your provider as well.   To learn more about what you can do with MyChart, go to [email or web address].   Other Instructions       1st Floor: - Lobby - Registration  - Pharmacy  - Lab - Cafe  2nd Floor: - PV Lab - Diagnostic Testing (echo, CT, nuclear med)  3rd Floor: - Vacant  4th Floor: - TCTS (cardiothoracic surgery) - AFib Clinic - Structural Heart Clinic - Vascular Surgery  - Vascular Ultrasound  5th Floor: - [facility]  (general and EP) - Clinical Pharmacy for coumadin, hypertension, lipid, weight-loss medications, and med management appointments    Valet parking services will be available as well.

## 2024-05-21 ENCOUNTER — Encounter: Payer: Self-pay | Admitting: Pharmacist

## 2024-07-03 ENCOUNTER — Telehealth: Payer: Self-pay | Admitting: Pharmacist

## 2024-07-03 NOTE — Telephone Encounter (Signed)
 Left VM that we need repeat labs. Left call back #. Need repeat cholesterol labs
